# Patient Record
Sex: Male | Born: 1960 | Race: Black or African American | Hispanic: No | Marital: Single | Smoking: Current every day smoker
Health system: Southern US, Community
[De-identification: ages and names within clinical notes are randomized; demographics above are authoritative.]

## PROBLEM LIST (undated history)

## (undated) DIAGNOSIS — R51 Headache: Secondary | ICD-10-CM

## (undated) DIAGNOSIS — K219 Gastro-esophageal reflux disease without esophagitis: Secondary | ICD-10-CM

## (undated) DIAGNOSIS — R519 Headache, unspecified: Secondary | ICD-10-CM

## (undated) DIAGNOSIS — F259 Schizoaffective disorder, unspecified: Secondary | ICD-10-CM

## (undated) DIAGNOSIS — F250 Schizoaffective disorder, bipolar type: Secondary | ICD-10-CM

---

## 2000-12-17 ENCOUNTER — Emergency Department (HOSPITAL_COMMUNITY): Admission: EM | Admit: 2000-12-17 | Discharge: 2000-12-17 | Payer: Self-pay | Admitting: Emergency Medicine

## 2002-01-30 ENCOUNTER — Inpatient Hospital Stay (HOSPITAL_COMMUNITY): Admission: EM | Admit: 2002-01-30 | Discharge: 2002-02-08 | Payer: Self-pay | Admitting: Psychiatry

## 2002-01-30 ENCOUNTER — Encounter: Payer: Self-pay | Admitting: Emergency Medicine

## 2002-03-27 ENCOUNTER — Inpatient Hospital Stay (HOSPITAL_COMMUNITY): Admission: AD | Admit: 2002-03-27 | Discharge: 2002-03-31 | Payer: Self-pay | Admitting: Psychiatry

## 2004-10-04 ENCOUNTER — Ambulatory Visit: Payer: Self-pay | Admitting: Psychiatry

## 2004-10-04 ENCOUNTER — Inpatient Hospital Stay (HOSPITAL_COMMUNITY): Admission: EM | Admit: 2004-10-04 | Discharge: 2004-10-10 | Payer: Self-pay | Admitting: Psychiatry

## 2004-10-11 ENCOUNTER — Emergency Department (HOSPITAL_COMMUNITY): Admission: EM | Admit: 2004-10-11 | Discharge: 2004-10-11 | Payer: Self-pay | Admitting: Emergency Medicine

## 2005-04-02 ENCOUNTER — Other Ambulatory Visit: Payer: Self-pay

## 2005-04-02 ENCOUNTER — Emergency Department: Payer: Self-pay | Admitting: Internal Medicine

## 2005-04-03 ENCOUNTER — Ambulatory Visit: Payer: Self-pay | Admitting: Internal Medicine

## 2005-05-06 ENCOUNTER — Inpatient Hospital Stay: Payer: Self-pay | Admitting: Psychiatry

## 2005-09-01 ENCOUNTER — Inpatient Hospital Stay: Payer: Self-pay | Admitting: Unknown Physician Specialty

## 2005-12-11 ENCOUNTER — Emergency Department: Payer: Self-pay | Admitting: Internal Medicine

## 2006-03-08 ENCOUNTER — Inpatient Hospital Stay: Payer: Self-pay | Admitting: Unknown Physician Specialty

## 2006-03-15 ENCOUNTER — Inpatient Hospital Stay: Payer: Self-pay | Admitting: Psychiatry

## 2006-04-19 ENCOUNTER — Emergency Department: Payer: Self-pay | Admitting: Emergency Medicine

## 2006-04-19 ENCOUNTER — Other Ambulatory Visit: Payer: Self-pay

## 2006-07-26 ENCOUNTER — Emergency Department: Payer: Self-pay | Admitting: Emergency Medicine

## 2010-08-30 ENCOUNTER — Ambulatory Visit: Payer: Self-pay | Admitting: Internal Medicine

## 2010-10-01 DIAGNOSIS — Q401 Congenital hiatus hernia: Secondary | ICD-10-CM | POA: Insufficient documentation

## 2011-02-20 ENCOUNTER — Emergency Department: Payer: Self-pay | Admitting: *Deleted

## 2011-10-15 ENCOUNTER — Emergency Department: Payer: Self-pay | Admitting: Emergency Medicine

## 2013-06-20 ENCOUNTER — Emergency Department: Payer: Self-pay | Admitting: Emergency Medicine

## 2013-12-17 DIAGNOSIS — F19951 Other psychoactive substance use, unspecified with psychoactive substance-induced psychotic disorder with hallucinations: Secondary | ICD-10-CM | POA: Diagnosis present

## 2013-12-17 DIAGNOSIS — F1420 Cocaine dependence, uncomplicated: Secondary | ICD-10-CM | POA: Insufficient documentation

## 2013-12-17 DIAGNOSIS — E039 Hypothyroidism, unspecified: Secondary | ICD-10-CM | POA: Insufficient documentation

## 2013-12-17 DIAGNOSIS — H538 Other visual disturbances: Secondary | ICD-10-CM | POA: Insufficient documentation

## 2014-01-20 ENCOUNTER — Emergency Department: Payer: Self-pay | Admitting: Emergency Medicine

## 2014-01-20 LAB — URINALYSIS, COMPLETE
Bacteria: NONE SEEN
Bilirubin,UR: NEGATIVE
Glucose,UR: NEGATIVE mg/dL (ref 0–75)
Ketone: NEGATIVE
Leukocyte Esterase: NEGATIVE
Nitrite: NEGATIVE
Ph: 7 (ref 4.5–8.0)
Protein: NEGATIVE
RBC,UR: 4 /HPF (ref 0–5)
Specific Gravity: 1.009 (ref 1.003–1.030)
Squamous Epithelial: <1
WBC UR: 1 /HPF (ref 0–5)

## 2014-01-20 LAB — COMPREHENSIVE METABOLIC PANEL
Albumin: 2.8 g/dL — ABNORMAL LOW (ref 3.4–5.0)
Alkaline Phosphatase: 63 U/L
Anion Gap: 2 — ABNORMAL LOW (ref 7–16)
BUN: 11 mg/dL (ref 7–18)
Bilirubin,Total: 0.4 mg/dL (ref 0.2–1.0)
Calcium, Total: 9.1 mg/dL (ref 8.5–10.1)
Chloride: 107 mmol/L (ref 98–107)
Co2: 31 mmol/L (ref 21–32)
Creatinine: 1.07 mg/dL (ref 0.60–1.30)
EGFR (African American): >60
EGFR (Non-African Amer.): >60
Glucose: 99 mg/dL (ref 65–99)
Osmolality: 279 (ref 275–301)
Potassium: 3.9 mmol/L (ref 3.5–5.1)
SGOT(AST): 26 U/L (ref 15–37)
SGPT (ALT): 22 U/L
Sodium: 140 mmol/L (ref 136–145)
Total Protein: 7.6 g/dL (ref 6.4–8.2)

## 2014-01-20 LAB — CBC WITH DIFFERENTIAL/PLATELET
Basophil #: 0.1 10*3/uL (ref 0.0–0.1)
Basophil %: 0.8 %
Eosinophil #: 0.1 10*3/uL (ref 0.0–0.7)
Eosinophil %: 1.3 %
HCT: 47.8 % (ref 40.0–52.0)
HGB: 15.9 g/dL (ref 13.0–18.0)
Lymphocyte #: 2.2 10*3/uL (ref 1.0–3.6)
Lymphocyte %: 37.3 %
MCH: 32.5 pg (ref 26.0–34.0)
MCHC: 33.3 g/dL (ref 32.0–36.0)
MCV: 97 fL (ref 80–100)
Monocyte #: 0.8 x10 3/mm (ref 0.2–1.0)
Monocyte %: 13.9 %
Neutrophil #: 2.8 10*3/uL (ref 1.4–6.5)
Neutrophil %: 46.7 %
Platelet: 339 10*3/uL (ref 150–440)
RBC: 4.9 10*6/uL (ref 4.40–5.90)
RDW: 14.2 % (ref 11.5–14.5)
WBC: 6 10*3/uL (ref 3.8–10.6)

## 2014-01-20 LAB — LIPASE, BLOOD: Lipase: 128 U/L (ref 73–393)

## 2014-01-24 LAB — SALICYLATE LEVEL: Salicylates, Serum: 3.1 mg/dL — ABNORMAL HIGH

## 2014-01-24 LAB — URINALYSIS, COMPLETE
Bacteria: NONE SEEN
Bilirubin,UR: NEGATIVE
GLUCOSE, UR: NEGATIVE mg/dL (ref 0–75)
Ketone: NEGATIVE
Leukocyte Esterase: NEGATIVE
NITRITE: NEGATIVE
PH: 7 (ref 4.5–8.0)
Protein: NEGATIVE
RBC,UR: 1 /HPF (ref 0–5)
SPECIFIC GRAVITY: 1.01 (ref 1.003–1.030)
WBC UR: 1 /HPF (ref 0–5)

## 2014-01-24 LAB — COMPREHENSIVE METABOLIC PANEL
Albumin: 2.9 g/dL — ABNORMAL LOW (ref 3.4–5.0)
Alkaline Phosphatase: 73 U/L
Anion Gap: 9 (ref 7–16)
BUN: 12 mg/dL (ref 7–18)
Bilirubin,Total: 0.1 mg/dL — ABNORMAL LOW (ref 0.2–1.0)
Calcium, Total: 8.7 mg/dL (ref 8.5–10.1)
Chloride: 109 mmol/L — ABNORMAL HIGH (ref 98–107)
Co2: 23 mmol/L (ref 21–32)
Creatinine: 1.02 mg/dL (ref 0.60–1.30)
EGFR (African American): >60
EGFR (Non-African Amer.): >60
Glucose: 82 mg/dL (ref 65–99)
Osmolality: 280 (ref 275–301)
Potassium: 3.6 mmol/L (ref 3.5–5.1)
SGOT(AST): 33 U/L (ref 15–37)
SGPT (ALT): 27 U/L
Sodium: 141 mmol/L (ref 136–145)
Total Protein: 7.5 g/dL (ref 6.4–8.2)

## 2014-01-24 LAB — DRUG SCREEN, URINE
AMPHETAMINES, UR SCREEN: NEGATIVE (ref ?–1000)
Barbiturates, Ur Screen: NEGATIVE (ref ?–200)
Benzodiazepine, Ur Scrn: NEGATIVE (ref ?–200)
Cannabinoid 50 Ng, Ur ~~LOC~~: NEGATIVE (ref ?–50)
Cocaine Metabolite,Ur ~~LOC~~: POSITIVE (ref ?–300)
MDMA (Ecstasy)Ur Screen: NEGATIVE (ref ?–500)
Methadone, Ur Screen: NEGATIVE (ref ?–300)
Opiate, Ur Screen: NEGATIVE (ref ?–300)
Phencyclidine (PCP) Ur S: NEGATIVE (ref ?–25)
TRICYCLIC, UR SCREEN: NEGATIVE (ref ?–1000)

## 2014-01-24 LAB — CBC
HCT: 41.4 % (ref 40.0–52.0)
HGB: 14.2 g/dL (ref 13.0–18.0)
MCH: 33 pg (ref 26.0–34.0)
MCHC: 34.4 g/dL (ref 32.0–36.0)
MCV: 96 fL (ref 80–100)
Platelet: 382 10*3/uL (ref 150–440)
RBC: 4.31 10*6/uL — AB (ref 4.40–5.90)
RDW: 14 % (ref 11.5–14.5)
WBC: 5.9 10*3/uL (ref 3.8–10.6)

## 2014-01-24 LAB — ACETAMINOPHEN LEVEL

## 2014-01-24 LAB — ETHANOL
Ethanol %: 0.077 % (ref 0.000–0.080)
Ethanol: 77 mg/dL

## 2014-01-25 ENCOUNTER — Inpatient Hospital Stay: Payer: Self-pay | Admitting: Psychiatry

## 2014-01-27 LAB — HEMOGLOBIN A1C: Hemoglobin A1C: 5.7 % (ref 4.2–6.3)

## 2014-01-27 LAB — LIPID PANEL
Cholesterol: 156 mg/dL (ref 0–200)
HDL: 56 mg/dL (ref 40–60)
LDL CHOLESTEROL, CALC: 76 mg/dL (ref 0–100)
Triglycerides: 121 mg/dL (ref 0–200)
VLDL CHOLESTEROL, CALC: 24 mg/dL (ref 5–40)

## 2014-06-04 ENCOUNTER — Emergency Department: Payer: Self-pay | Admitting: Student

## 2014-06-16 ENCOUNTER — Emergency Department: Payer: Self-pay | Admitting: Emergency Medicine

## 2014-10-22 NOTE — Consult Note (Signed)
[name]:  Charles Charles, Charles Charles DATE OF BIRTH:  10/22/1960  DATE OF CONSULTATION:  01/24/2014  CONSULTING PHYSICIAN:  Jolanta B. Jennet MaduroPucilowska, MD  REQUESTING PHYSICIAN: Dr. Bayard Malesandolph Brown.  CONSULTING PHYSICIAN: Dr. Kristine LineaJolanta [name].  REASON FOR CONSULTATION: To evaluate psychotic patient.   IDENTIFYING DATA: Charles Charles is a 54 year old male with history of schizoaffective disorder and substance abuse.   CHIEF COMPLAINT: "I hear voices."   HISTORY OF PRESENT ILLNESS: Charles Charles has a long history of substance abuse, mood instability, and psychosis. He reports that 3 months ago he was in jail, where he received treatment with Paxil, Haldol, and Cogentin. Following release from jail, he has been off medications. He has been drinking alcohol and using cocaine steady for the past 3 months. He was staying with his mother but this is no longer possible and the patient is homeless. He came to the hospital complaining of auditory hallucinations, telling him to jump in front of the truck. He is open to substance abuse treatment. He reports some symptoms of depression with poor sleep, decreased appetite, anhedonia, feeling of guilt, hopelessness, worthlessness, but he did not have suicidal ideation until he developed hallucinations.   PAST PSYCHIATRIC HISTORY: At least 2 hospitalizations at [facility]. One admission to [facility] substance abuse treatment facility many years ago. He reports that [facility] but has not been there for a while. He reports 1 suicide attempt by cutting.   FAMILY PSYCHIATRIC HISTORY: None reported.   PAST MEDICAL HISTORY: GERD.   ALLERGIES: PENICILLIN.   MEDICATIONS ON ADMISSION: None. He should be taking Paxil 20 mg, Haldol 5 mg, Cogentin 1 mg twice daily, and Synthroid unknown dose.   SOCIAL HISTORY: As above, he is unemployed, has no income. He is homeless. No longer he is allowed to stay with his mother. He  has Medicaid.   REVIEW OF SYSTEMS:  CONSTITUTIONAL: No fevers or chills. No weight changes.  EYES: No double or blurred vision.  ENT: No hearing loss.  RESPIRATORY: No shortness of breath or cough.  CARDIOVASCULAR: No chest pain or orthopnea.  GASTROINTESTINAL: No abdominal pain, nausea, vomiting, or diarrhea.  GENITOURINARY: No incontinence or frequency.  ENDOCRINE: No heat or cold intolerance.  LYMPHATIC: No anemia or easy bruising.  INTEGUMENTARY: No acne or rash.  MUSCULOSKELETAL: No muscle or joint pain.  NEUROLOGIC: No tingling or weakness.  PSYCHIATRIC: See history of present illness for details.   PHYSICAL EXAMINATION:  VITAL SIGNS: Blood pressure 110/58, pulse 69, respirations 18, temperature 97.7.  GENERAL: This is a middle-aged male in no acute distress. The rest of the physical examination is deferred to his primary attending.   LABORATORY DATA: Chemistries are within normal limits. Blood alcohol level is 0.077. LFTs within normal limits. Urine tox screen positive for cocaine. CBC within normal limits. Urinalysis is not suggestive of urinary tract infection. Serum acetaminophen and salicylates are low.   MENTAL STATUS EXAMINATION: The patient is alert and oriented to person, place, time, and situation. He is pleasant, polite, and cooperative. He is well groomed, wearing hospital scrubs. He maintains good eye contact. His speech is of normal rhythm, rate, and volume. Mood is depressed with flat affect. Thought process is logical and goal oriented. He denies suicidal or homicidal ideations, but has auditory hallucinations commanding him to jump in front of the truck. His cognition is grossly intact. Registration, recall, short and long-term memory are intact. He is of average intelligence and  fund of knowledge. His insight and judgment are poor.    DIAGNOSES:  AXIS I: Schizoaffective disorder, depressed, cocaine dependence, alcohol dependence,   AXIS II: Deferred.   AXIS III:  Hypothyroidism.   AXIS IV: Mental illness, substance abuse, homelessness, primary support, poor insight, access to care.   AXIS V: Global assessment of functioning 35.   PLAN:  1. The patient is on IVC.  2. Will refer him to [facility] substance abuse treatment facility for further treatment. I will restart Paxil, Haldol, Cogentin.   We will follow up.    ____________________________ Braulio Conte B. Jennet Maduro, MD jbp:lt D: 01/24/2014 19:33:52 ET T: 01/25/2014 01:00:53 ET JOB#: [id number]  cc: Jolanta B. Jennet Maduro, MD, <Dictator> Shari Prows MD ELECTRONICALLY SIGNED 01/29/2014 7:23

## 2014-10-22 NOTE — Consult Note (Signed)
Brief Consult Note: Diagnosis: Alcohol dependence, cocaione dependence, shizoaffective disorder.   Patient was seen by consultant.   Consult note dictated.   Recommend further assessment or treatment.   Orders entered.   Comments: [name] has a h/o psychosis and substance use off medications for 3 months. He has command AH.  PLAN: 1. He is on IVC.  2. Will start Haldol and paxil for depression and psychosis.  3. Will refer to [facility].  Electronic Signatures: Kristine LineaPucilowska, Jalei Shibley (MD)  (Signed 27-Jul-15 14:50)  Authored: Brief Consult Note   Last Updated: 27-Jul-15 14:50 by Kristine LineaPucilowska, Kal Chait (MD)

## 2014-11-08 NOTE — H&P (Signed)
[name]:  Charles Charles, Charles Charles [id number] OF BIRTH:  August 14, 1960 OF ADMISSION:  01/25/2014  REFERRING PHYSICIAN: Dr. Bayard Malesandolph Charles.PHYSICIAN: Dr. Kristine LineaJolanta Tennis Charles. DATA: Charles Charles is a 54 year old male with history of schizoaffective disorder and substance abuse.  COMPLAINT: "I hear voices."  OF PRESENT ILLNESS: Charles Charles has a long history of substance abuse, mood instability, and psychosis. He reports that 3 months ago he was in jail for 4 months, where he received treatment with Paxil, Haldol, and Cogentin. Following release from jail, he has been off medications. He has been drinking alcohol and using cocaine steady for the past 3 months. He was staying with his mother but this is no longer possible and the patient is homeless. He came to the hospital complaining of auditory hallucinations, telling him to jump in front of the truck. He is open to substance abuse treatment. He reports some symptoms of depression with poor sleep, decreased appetite, anhedonia, feeling of guilt, hopelessness, worthlessness, but he did not have suicidal ideation until he developed hallucinations.  PSYCHIATRIC HISTORY: At least 2 hospitalizations at [facility]. One admission to [facility] substance abuse treatment facility many years ago. He reports that he gets his medications from [facility] but has not been there for a while. He reports 1 suicide attempt by cutting.  PSYCHIATRIC HISTORY: None reported.  MEDICAL HISTORY: GERD.   ALLERGIES: PENICILLIN.  ON ADMISSION: None. He should be taking Paxil 20 mg, Haldol 5 mg, Cogentin 1 mg twice daily, and Synthroid unknown dose.  HISTORY: As above, he is unemployed, has no income. He is homeless. No longer he is allowed to stay with his mother. He has Medicaid.  OF SYSTEMS: No fevers or chills. No weight changes. No double or blurred vision. No hearing loss. No shortness of breath or cough. No chest pain or orthopnea. No abdominal pain, nausea, vomiting, or  diarrhea. No incontinence or frequency. No heat or cold intolerance. No anemia or easy bruising. No acne or rash. No muscle or joint pain. No tingling or weakness. See history of present illness for details.  EXAMINATION: SIGNS: Blood pressure 10610/6658, pulse 4269, respirations 1820, temperature 97.67.  GENERAL: This is a middle-aged male in no acute distress. The pupils are equal, round, and reactive to light. Sclerae anicteric.  NECK: Supple. No thyromegaly. Clear to auscultation. No dullness to percussion. Regular rhythm and rate. No murmurs, rubs, or gallops. Soft, nontender, nondistended. Positive bowel sounds. Normal muscle strength in all extremities. No rashes or bruises. No cervical adenopathy. Cranial nerves II through XII are intact. DATA: Chemistries are within normal limits. Blood alcohol level is 0.077. LFTs within normal limits. Urine tox screen positive for cocaine. CBC within normal limits. Urinalysis is not suggestive of urinary tract infection. Serum acetaminophen and salicylates are low.  STATUS EXAMINATION ON ADMISSION: The patient is alert and oriented to person, place, time, and situation. He is pleasant, polite, and cooperative. He is well groomed, wearing hospital scrubs. He maintains good eye contact. His speech is of normal rhythm, rate, and volume. Mood is depressed with flat affect. Thought process is logical and goal oriented. He denies suicidal or homicidal ideations, but has auditory hallucinations commanding him to jump in front of the truck. His cognition is grossly intact. Registration, recall, short and long-term memory are intact. He is of average intelligence and fund of knowledge. His insight and judgment are poor.   DIAGNOSES: I:  Schizoaffective disorder, depressed.   Cocaine dependence.   Alcohol dependence, II:  Deferred.  III:  Hypothyroidism. IV:  Mental illness, substance abuse, homelessness, primary support, poor insight. V:  Global assessment of functioning on  admission 35.   Suicidal ideation: The patient is able to CFS. Mood/psychosis: We restart Paxil, Haldol, Cogentin.   3. Alcohol dependence: The patient has no symptoms of alcohol withdrawal. will continue to monitor.Substance abuse treatment: He was referred to [facility]. It is doubtful that he will have a bed soon. He will follow up with IOP program more likely.Dispo: He is now allowed to return to his mother's house.     Electronic Signatures: Charles Charles, Charles Charles (MD) (Signed on 29-Jul-15 16:38)  Authored   Last Updated: 29-Jul-15 16:40 by Charles Charles, Charles Charles (MD)

## 2014-12-14 DIAGNOSIS — F1510 Other stimulant abuse, uncomplicated: Secondary | ICD-10-CM | POA: Insufficient documentation

## 2014-12-23 ENCOUNTER — Emergency Department: Payer: Medicaid Other

## 2014-12-23 ENCOUNTER — Emergency Department
Admission: EM | Admit: 2014-12-23 | Discharge: 2014-12-24 | Disposition: A | Discharge status: Home / Self Care | Payer: Medicaid Other | Attending: Emergency Medicine | Admitting: Emergency Medicine

## 2014-12-23 ENCOUNTER — Encounter: Payer: Self-pay | Admitting: Emergency Medicine

## 2014-12-23 DIAGNOSIS — F259 Schizoaffective disorder, unspecified: Secondary | ICD-10-CM | POA: Diagnosis not present

## 2014-12-23 DIAGNOSIS — W010XXA Fall on same level from slipping, tripping and stumbling without subsequent striking against object, initial encounter: Secondary | ICD-10-CM | POA: Insufficient documentation

## 2014-12-23 DIAGNOSIS — Z720 Tobacco use: Secondary | ICD-10-CM | POA: Diagnosis not present

## 2014-12-23 DIAGNOSIS — Z880 Allergy status to penicillin: Secondary | ICD-10-CM | POA: Diagnosis not present

## 2014-12-23 DIAGNOSIS — Y9289 Other specified places as the place of occurrence of the external cause: Secondary | ICD-10-CM | POA: Insufficient documentation

## 2014-12-23 DIAGNOSIS — S80212A Abrasion, left knee, initial encounter: Secondary | ICD-10-CM | POA: Diagnosis not present

## 2014-12-23 DIAGNOSIS — S0990XA Unspecified injury of head, initial encounter: Secondary | ICD-10-CM | POA: Diagnosis present

## 2014-12-23 DIAGNOSIS — Y9389 Activity, other specified: Secondary | ICD-10-CM | POA: Diagnosis not present

## 2014-12-23 DIAGNOSIS — F10129 Alcohol abuse with intoxication, unspecified: Secondary | ICD-10-CM | POA: Insufficient documentation

## 2014-12-23 DIAGNOSIS — F1410 Cocaine abuse, uncomplicated: Secondary | ICD-10-CM

## 2014-12-23 DIAGNOSIS — Y998 Other external cause status: Secondary | ICD-10-CM | POA: Insufficient documentation

## 2014-12-23 DIAGNOSIS — S023XXA Fracture of orbital floor, initial encounter for closed fracture: Secondary | ICD-10-CM | POA: Insufficient documentation

## 2014-12-23 DIAGNOSIS — F10929 Alcohol use, unspecified with intoxication, unspecified: Secondary | ICD-10-CM

## 2014-12-23 DIAGNOSIS — F1210 Cannabis abuse, uncomplicated: Secondary | ICD-10-CM | POA: Insufficient documentation

## 2014-12-23 DIAGNOSIS — W19XXXA Unspecified fall, initial encounter: Secondary | ICD-10-CM

## 2014-12-23 DIAGNOSIS — S0093XA Contusion of unspecified part of head, initial encounter: Secondary | ICD-10-CM | POA: Diagnosis not present

## 2014-12-23 DIAGNOSIS — S0292XA Unspecified fracture of facial bones, initial encounter for closed fracture: Secondary | ICD-10-CM

## 2014-12-23 HISTORY — DX: Schizoaffective disorder, unspecified: F25.9

## 2014-12-23 HISTORY — DX: Schizoaffective disorder, bipolar type: F25.0

## 2014-12-23 LAB — CBC WITH DIFFERENTIAL/PLATELET
BASOS ABS: 0.1 10*3/uL (ref 0–0.1)
BASOS PCT: 0 %
EOS ABS: 0 10*3/uL (ref 0–0.7)
EOS PCT: 0 %
HCT: 39.3 % — ABNORMAL LOW (ref 40.0–52.0)
Hemoglobin: 12.7 g/dL — ABNORMAL LOW (ref 13.0–18.0)
Lymphocytes Relative: 23 %
Lymphs Abs: 3 10*3/uL (ref 1.0–3.6)
MCH: 30.8 pg (ref 26.0–34.0)
MCHC: 32.2 g/dL (ref 32.0–36.0)
MCV: 95.6 fL (ref 80.0–100.0)
Monocytes Absolute: 1.7 10*3/uL — ABNORMAL HIGH (ref 0.2–1.0)
Monocytes Relative: 13 %
Neutro Abs: 8.5 10*3/uL — ABNORMAL HIGH (ref 1.4–6.5)
Neutrophils Relative %: 64 %
PLATELETS: 316 10*3/uL (ref 150–440)
RBC: 4.11 MIL/uL — ABNORMAL LOW (ref 4.40–5.90)
RDW: 14.1 % (ref 11.5–14.5)
WBC: 13.2 10*3/uL — ABNORMAL HIGH (ref 3.8–10.6)

## 2014-12-23 LAB — URINE DRUG SCREEN, QUALITATIVE (ARMC ONLY)
AMPHETAMINES, UR SCREEN: NOT DETECTED
Barbiturates, Ur Screen: NOT DETECTED
Benzodiazepine, Ur Scrn: NOT DETECTED
Cannabinoid 50 Ng, Ur ~~LOC~~: POSITIVE — AB
Cocaine Metabolite,Ur ~~LOC~~: POSITIVE — AB
MDMA (ECSTASY) UR SCREEN: NOT DETECTED
Methadone Scn, Ur: NOT DETECTED
Opiate, Ur Screen: NOT DETECTED
Phencyclidine (PCP) Ur S: NOT DETECTED
TRICYCLIC, UR SCREEN: NOT DETECTED

## 2014-12-23 LAB — COMPREHENSIVE METABOLIC PANEL
ALBUMIN: 3.2 g/dL — AB (ref 3.5–5.0)
ALT: 22 U/L (ref 17–63)
ANION GAP: 18 — AB (ref 5–15)
AST: 27 U/L (ref 15–41)
Alkaline Phosphatase: 53 U/L (ref 38–126)
BILIRUBIN TOTAL: 0.3 mg/dL (ref 0.3–1.2)
BUN: 13 mg/dL (ref 6–20)
CALCIUM: 8.9 mg/dL (ref 8.9–10.3)
CHLORIDE: 109 mmol/L (ref 101–111)
CO2: 13 mmol/L — ABNORMAL LOW (ref 22–32)
CREATININE: 1.08 mg/dL (ref 0.61–1.24)
GFR calc Af Amer: >60 mL/min (ref >60)
Glucose, Bld: 103 mg/dL — ABNORMAL HIGH (ref 65–99)
Potassium: 3.3 mmol/L — ABNORMAL LOW (ref 3.5–5.1)
Sodium: 140 mmol/L (ref 135–145)
Total Protein: 7 g/dL (ref 6.5–8.1)

## 2014-12-23 LAB — URINALYSIS COMPLETE WITH MICROSCOPIC (ARMC ONLY)
Bacteria, UA: NONE SEEN
Bilirubin Urine: NEGATIVE
Glucose, UA: NEGATIVE mg/dL
KETONES UR: NEGATIVE mg/dL
LEUKOCYTES UA: NEGATIVE
NITRITE: NEGATIVE
Protein, ur: NEGATIVE mg/dL
Specific Gravity, Urine: 1.005 (ref 1.005–1.030)
Squamous Epithelial / LPF: NONE SEEN
pH: 5 (ref 5.0–8.0)

## 2014-12-23 LAB — TROPONIN I

## 2014-12-23 LAB — ETHANOL: Alcohol, Ethyl (B): 133 mg/dL — ABNORMAL HIGH (ref <5)

## 2014-12-23 MED ORDER — DEXTROSE 5 % IV SOLN
500.0000 mg | Freq: Once | INTRAVENOUS | Status: AC
  Administered 2014-12-23: 500 mg via INTRAVENOUS

## 2014-12-23 MED ORDER — AZITHROMYCIN 500 MG PO TABS: 500.0000 mg | ORAL_TABLET | Freq: Every day | ORAL | Status: AC

## 2014-12-23 MED ORDER — DEXTROSE 5 % IV SOLN
INTRAVENOUS | Status: AC
Start: 2014-12-23 — End: 2014-12-24
  Administered 2014-12-23: 500 mg via INTRAVENOUS
  Filled 2014-12-23: qty 500

## 2014-12-23 MED ORDER — AZITHROMYCIN 250 MG PO TABS
ORAL_TABLET | ORAL | Status: AC
  Filled 2014-12-23: qty 2

## 2014-12-23 MED ORDER — SODIUM CHLORIDE 0.9 % IV BOLUS (SEPSIS)
1000.0000 mL | Freq: Once | INTRAVENOUS | Status: AC
  Administered 2014-12-23: 1000 mL via INTRAVENOUS

## 2014-12-23 NOTE — Discharge Instructions (Signed)
You have a fracture of the bone on the inside wall of your left eye. With this fracture, you have some blood in your sinus. You may experience ongoing pressure and some congestion. Take azithromycin due to the congestion in your sinus. Follow-up with either ear nose and throat or with ophthalmology or further evaluation of this facial fracture.  Your head CT was okay-no bleeding on your brain. The CT of your neck was okay also.  Return to the emergency department if you have vision problems such as double vision, a disproportionate headache, or if you have other urgent concerns.  Polysubstance Abuse When people abuse more than one drug or type of drug it is called polysubstance or polydrug abuse. For example, many smokers also drink alcohol. This is one form of polydrug abuse. Polydrug abuse also refers to the use of a drug to counteract an unpleasant effect produced by another drug. It may also be used to help with withdrawal from another drug. People who take stimulants may become agitated. Sometimes this agitation is countered with a tranquilizer. This helps protect against the unpleasant side effects. Polydrug abuse also refers to the use of different drugs at the same time.  Anytime drug use is interfering with normal living activities, it has become abuse. This includes problems with family and friends. Psychological dependence has developed when your mind tells you that the drug is needed. This is usually followed by physical dependence which has developed when continuing increases of drug are required to get the same feeling or "high". This is known as addiction or chemical dependency. A person's risk is much higher if there is a history of chemical dependency in the family. SIGNS OF CHEMICAL DEPENDENCY  You have been told by friends or family that drugs have become a problem.  You fight when using drugs.  You are having blackouts (not remembering what you do while using).  You feel sick  from using drugs but continue using.  You lie about use or amounts of drugs (chemicals) used.  You need chemicals to get you going.  You are suffering in work performance or in school because of drug use.  You get sick from use of drugs but continue to use anyway.  You need drugs to relate to people or feel comfortable in social situations.  You use drugs to forget problems. "Yes" answered to any of the above signs of chemical dependency indicates there are problems. The longer the use of drugs continues, the greater the problems will become. If there is a family history of drug or alcohol use, i Head Injury You have a head injury. Headaches and throwing up (vomiting) are common after a head injury. It should be easy to wake up from sleeping. Sometimes you must stay in the hospital. Most problems happen within the first 24 hours. Side effects may occur up to 7-10 days after the injury.  WHAT ARE THE TYPES OF HEAD INJURIES? Head injuries can be as minor as a bump. Some head injuries can be more severe. More severe head injuries include:  A jarring injury to the brain (concussion).  A bruise of the brain (contusion). This mean there is bleeding in the brain that can cause swelling.  A cracked skull (skull fracture).  Bleeding in the brain that collects, clots, and forms a bump (hematoma). WHEN SHOULD I GET HELP RIGHT AWAY?   You are confused or sleepy.  You cannot be woken up.  You feel sick to your stomach (nauseous)  or keep throwing up (vomiting).  Your dizziness or unsteadiness is getting worse.  You have very bad, lasting headaches that are not helped by medicine. Take medicines only as told by your doctor.  You cannot use your arms or legs like normal.  You cannot walk.  You notice changes in the black spots in the center of the colored part of your eye (pupil).  You have clear or bloody fluid coming from your nose or ears.  You have trouble seeing. During the next  24 hours after the injury, you must stay with someone who can watch you. This person should get help right away (call 911 in the [place] if you start to shake and are not able to control it (have seizures), you pass out, or you are unable to wake up. HOW CAN I PREVENT A HEAD INJURY IN THE FUTURE?  Wear seat belts.  Wear a helmet while bike riding and playing sports like football.  Stay away from dangerous activities around the house. WHEN CAN I RETURN TO NORMAL ACTIVITIES AND ATHLETICS? See your doctor before doing these activities. You should not do normal activities or play contact sports until 1 week after the following symptoms have stopped:  Headache that does not go away.  Dizziness.  Poor attention.  Confusion.  Memory problems.  Sickness to your stomach or throwing up.  Tiredness.  Fussiness.  Bothered by bright lights or loud noises.  Anxiousness or depression.  Restless sleep. MAKE SURE YOU:   Understand these instructions.  Will watch your condition.  Will get help right away if you are not doing well or get worse. Document Released: 05/30/2008 Document Revised: 11/01/2013 Document Reviewed: 02/22/2013 [facility] Patient Information 2015 [place], [place]. This information is not intended to replace advice given to you by your health care provider. Make sure you discuss any questions you have with your health care provider. t is best not to experiment with these drugs. Continual use leads to tolerance. After tolerance develops more of the drug is needed to get the same feeling. This is followed by addiction. With addiction, drugs become the most important part of life. It becomes more important to take drugs than participate in the other usual activities of life. This includes relating to friends and family. Addiction is followed by dependency. Dependency is a condition where drugs are now needed not just to get high, but to feel normal. Addiction cannot be cured but it  can be stopped. This often requires outside help and the care of professionals. Treatment centers are listed in the yellow pages under: Cocaine, Narcotics, and Alcoholics Anonymous. Most hospitals and clinics can refer you to a specialized care center. Talk to your caregiver if you need help. Document Released: 02/06/2005 Document Revised: 09/09/2011 Document Reviewed: 06/17/2005 [facility] Patient Information 2015 [place], [place]. This information is not intended to replace advice given to you by your health care provider. Make sure you discuss any questions you have with your health care provider.

## 2014-12-23 NOTE — ED Provider Notes (Signed)
[facility] Emergency Department Provider Note  ____________________________________________  Time seen: 2050  I have reviewed the triage vital signs and the nursing notes.  History by patient. This may be somewhat limited by intoxication. Additional history by paramedic.  HISTORY  Chief Complaint Fall facial contusions Alcohol intoxication   HPI Charles Charles is a 54 y.o. male who is been drinking today. He also smoked a little bit of crack cocaine. After smoking crack he was walking home and the patient reports he tripped. He reports he had a loss of consciousness and the loss of consciousness is why he tripped. The paramedic reports that the patient had said earlier that he had tripped on a curb and this was consistent with the paramedics observations at the scene as a possible mechanism of injury.  With the fall, the patient hit his face. He has notable contusion to the left brow which included some bleeding. He reports he has pain in his face and in his neck. He also has an abrasion to the left knee.  The patient was recently released from [facility] for schizophrenia. He has been prescribed Haldol, but he has not had this prescription filled yet.   Past Medical History  Diagnosis Date  . Schizo-affective schizophrenia    Schizophrenia  There are no active problems to display for this patient.   History reviewed. No pertinent past surgical history.  Current Outpatient Rx  Name  Route  Sig  Dispense  Refill  . azithromycin (ZITHROMAX) 500 MG tablet   Oral   Take 1 tablet (500 mg total) by mouth daily. Take 1 tablet daily for 3 days.   3 tablet   0     Allergies Penicillins  History reviewed. No pertinent family history.  Social History History  Substance Use Topics  . Smoking status: Current Every Day Smoker -- 0.10 packs/day    Types: Cigarettes  . Smokeless tobacco: Not on file  . Alcohol Use: 33.6 oz/week    56 Cans of beer per  week     Comment: 2 x 40's dailey    Review of Systems  Constitutional: Negative for fever. ENT: Notable for contusions and abrasions to the face. See history of present illness Cardiovascular: Loss of consciousness, likely due to head injury, but syncope not excluded. See history of present illness Respiratory: Negative for shortness of breath. Gastrointestinal: Negative for abdominal pain, vomiting and diarrhea. Genitourinary: Negative for dysuria. Musculoskeletal: No myalgias or injuries. Skin: Abrasions as noted. Neurological: Some slurred speech consistent with intoxication. Psychiatric: Recent hospitalization for schizophrenia.   10-point ROS otherwise negative.  ____________________________________________   PHYSICAL EXAM:  VITAL SIGNS: ED Triage Vitals  Enc Vitals Group     BP 12/23/14 2054 127/82 mmHg     Pulse Rate 12/23/14 2054 86     Resp 12/23/14 2054 18     Temp 12/23/14 2054 99.1 F (37.3 C)     Temp Source 12/23/14 2054 Oral     SpO2 12/23/14 2047 98 %     Weight 12/23/14 2054 163 lb 8 oz (74.163 kg)     Height 12/23/14 2054  (1.575 m)     Head Cir --      Peak Flow --      Pain Score 12/23/14 2057 8     Pain Loc --      Pain Edu? --      Excl. in GC? --     Constitutional: Patient is alert  but with slurred speech and appears intoxicated. He follows commands and provides reasonable answers. He is on a backboard with a c-collar in place. ENT   Head: Contusion and swelling above the left brow..   Face:  There is point tenderness to the maxilla. No noted deformity. Assessment for instability is not tolerated.      Nose: No bleeding or discharge. No deformity. Neck:  Notably tender diffusely. Cervical spine precautions continued Cardiovascular: Normal rate, regular rhythm, no murmur noted Respiratory:  Normal respiratory effort, no tachypnea.    Breath sounds are clear and equal bilaterally.  Gastrointestinal: Soft and nontender. No  distention.  Back: No muscle spasm, no tenderness, no CVA tenderness. Musculoskeletal: No deformity noted. Some tenderness around the left knee where there is an abrasion. He has some discomfort with range of motion of the left knee. No deformity.  Neurologic:  Mildly slurred speech and little bit slow to respond to certain questions. No gross focal neurologic deficits are appreciated.  Skin:  Skin is warm, dry. Abrasion above left brow as well as left knee. Psychiatric: Verbal, communicative, but appears intoxicated. ____________________________________________    LABS (pertinent positives/negatives)  Urinalysis: Negative Urine drug screen positive for cocaine and cannabinoids. Alcohol: 133 CBC: White blood cell 13,000, hemoglobin 12.7, platelets 316 Metabolic panel: Pending Troponin: Pending ____________________________________________   EKG  ED ECG REPORT I, Abdulrahim Siddiqi W, the attending physician, personally viewed and interpreted this ECG.   Date: 12/23/2014  EKG Time: 2101  Rate: 97  Rhythm: Normal sinus rhythm  Axis: Normal  Intervals: Normal   ST&T Change: None noted   ____________________________________________    RADIOLOGY  CT of head, maxillofacial, and cervical spine: IMPRESSION: 1. No evidence of traumatic intracranial injury. 2. Depressed fracture involving the medial wall of the left orbit, with mild herniation of intraorbital fat, and opacification of the left ethmoid air cells and left nasal passages. Trace associated blood and air noted medial to the left medial rectus muscle. No evidence of entrapment of the extraocular musculature at this time. 3. Soft tissue swelling about the left orbit, and overlying the left maxilla. 4. No evidence of fracture or subluxation along the cervical spine. 5. Minimal bronchiectasis at the right lung apex.    ____________________________________________  INITIAL IMPRESSION / ASSESSMENT AND PLAN / ED  COURSE  Pertinent labs & imaging results that were available during my care of the patient were reviewed by me and considered in my medical decision making (see chart for details).  Patient with a fall. Unclear if this was syncope or mechanical. Suspect primarily mechanical. He has notable contusion to the left brow. He has pain in his neck. We'll obtain a CT scan of the head, maxillofacial, and cervical spine. We will check labs and assess a urine drug screen and an alcohol level.  ----------------------------------------- 10:52 PM on 12/23/2014 -----------------------------------------  We received a call from radiology who reports a negative head CT, a positive axilla facial CT with a medial wall fracture of the left orbit, and a negative C-spine CT.  I will treat the patient with an antibiotic due to opacification of the left sinus secondary to the medial wall fracture. He is allergic penicillins. We will treat him with azithromycin. This is he had a bandage that the duration of treatment can be short and yet still provide him with adequate long-term coverage.  ----------------------------------------- 11:30 PM on 12/23/2014 -----------------------------------------  I have reassessed the patient. He is more communicative. I have reviewed the orbital fracture with  him and the need for follow-up. He appears to understand the situation currently but he reports that he has limited ability to remember details. We are attempting to contact a friend or family for the patient. We expect revealed to discharge him after IV antibiotics.  I have reassessed the left eyelid. He continues to have a small amount of blood from the small skin injury. There is no indication for closure.  I have asked my colleague, Dr. Dolores Frame, to follow-up on the Presence of metabolic panel and the troponin. If these are normal, the patient should be able to be discharged home for outpatient  follow-up.  ____________________________________________   FINAL CLINICAL IMPRESSION(S) / ED DIAGNOSES  Final diagnoses:  Facial fracture due to fall, closed, initial encounter  Head contusion, initial encounter  Alcohol intoxication, with unspecified complication  Cocaine abuse      Darien Ramus, MD 12/23/14 2332

## 2014-12-23 NOTE — ED Notes (Signed)
Patient transported to CT 

## 2014-12-23 NOTE — ED Notes (Signed)
Patient presents to Emergency Department via EMS with complaints of fall, unwitnessed, pt believes LOC.  Injury to left eye, cheek arch, left elbow, left knee.  Pt reports ETOH  and smoking crack earlier today. Hx schizo diag from leaving [place] this week with script for haldol.

## 2014-12-24 MED ORDER — SODIUM CHLORIDE 0.9 % IV BOLUS (SEPSIS)
1000.0000 mL | Freq: Once | INTRAVENOUS | Status: AC
  Administered 2014-12-24: 1000 mL via INTRAVENOUS

## 2014-12-24 MED ORDER — OXYCODONE-ACETAMINOPHEN 5-325 MG PO TABS: 1.0000 | ORAL_TABLET | Freq: Four times a day (QID) | ORAL | Status: DC | PRN

## 2014-12-24 NOTE — ED Notes (Signed)
Dry dressing placed over left eye.

## 2014-12-24 NOTE — ED Provider Notes (Signed)
-----------------------------------------   8:57 AM on 12/24/2014 -----------------------------------------  Patient able to ambulate, but does note that it is somewhat difficult due to pain and soreness. He is steady on his feet, clear speech, tolerating by mouth. He is clinically sober and stable for discharge at this time. He has been counseled extensively on his facial fractures and started on prophylactic antibiotics and will follow up with ENT or ophthalmology.  Sharman Cheek, MD 12/24/14 [phone]

## 2014-12-24 NOTE — ED Provider Notes (Signed)
-----------------------------------------   1:57 AM on 12/24/2014 -----------------------------------------  Results of metabolic panel and troponin noted. Troponin is negative. Patient has a mildly elevated anion gap which is likely due to substance use. Negative ketones in urine with normal glucose. Will continue IV fluid resuscitation. Patient is currently sleeping soundly in no acute distress.  ----------------------------------------- 6:44 AM on 12/24/2014 -----------------------------------------  Patient got up several times during the night; is currently resting in no acute distress. Family has called to inquire about him. Plan to ambulate patient once awake. Anticipate discharge home once patient is sober and ambulatory. Care transferred to Dr. Scotty Court.  Irean Hong, MD 12/24/14 [phone]

## 2015-07-13 ENCOUNTER — Emergency Department
Admission: EM | Admit: 2015-07-13 | Discharge: 2015-07-13 | Disposition: A | Discharge status: Home / Self Care | Payer: Medicaid Other | Attending: Emergency Medicine | Admitting: Emergency Medicine

## 2015-07-13 ENCOUNTER — Emergency Department: Payer: Medicaid Other

## 2015-07-13 DIAGNOSIS — F17210 Nicotine dependence, cigarettes, uncomplicated: Secondary | ICD-10-CM | POA: Insufficient documentation

## 2015-07-13 DIAGNOSIS — Z880 Allergy status to penicillin: Secondary | ICD-10-CM | POA: Diagnosis not present

## 2015-07-13 DIAGNOSIS — R109 Unspecified abdominal pain: Secondary | ICD-10-CM | POA: Diagnosis present

## 2015-07-13 DIAGNOSIS — K5900 Constipation, unspecified: Secondary | ICD-10-CM | POA: Insufficient documentation

## 2015-07-13 DIAGNOSIS — R1084 Generalized abdominal pain: Secondary | ICD-10-CM

## 2015-07-13 MED ORDER — POLYETHYLENE GLYCOL 3350 17 G PO PACK: 17.0000 g | PACK | Freq: Every day | ORAL | Status: DC | PRN

## 2015-07-13 MED ORDER — MAGNESIUM CITRATE PO SOLN
160.0000 mL | Freq: Once | ORAL | Status: AC
  Administered 2015-07-13: 160 mL via ORAL
  Filled 2015-07-13: qty 296

## 2015-07-13 MED ORDER — DOCUSATE SODIUM 100 MG PO CAPS
100.0000 mg | ORAL_CAPSULE | Freq: Once | ORAL | Status: AC
  Administered 2015-07-13: 100 mg via ORAL
  Filled 2015-07-13: qty 1

## 2015-07-13 MED ORDER — ACETAMINOPHEN 325 MG PO TABS
650.0000 mg | ORAL_TABLET | Freq: Once | ORAL | Status: AC
  Administered 2015-07-13: 650 mg via ORAL

## 2015-07-13 MED ORDER — LORAZEPAM 1 MG PO TABS
1.0000 mg | ORAL_TABLET | Freq: Once | ORAL | Status: AC
  Administered 2015-07-13: 1 mg via ORAL
  Filled 2015-07-13: qty 1

## 2015-07-13 MED ORDER — ACETAMINOPHEN 325 MG PO TABS
ORAL_TABLET | ORAL | Status: AC
  Administered 2015-07-13: 650 mg via ORAL
  Filled 2015-07-13: qty 2

## 2015-07-13 MED ORDER — LACTULOSE 10 GM/15ML PO SOLN
10.0000 g | Freq: Once | ORAL | Status: AC
  Administered 2015-07-13: 10 g via ORAL
  Filled 2015-07-13: qty 30

## 2015-07-13 NOTE — ED Provider Notes (Signed)
-----------------------------------------   9:18 PM on 07/13/2015 -----------------------------------------   Initially the plan was for Dr. Pershing ProudSchaevitz sinus patient out to me but he was able to disposition the patient prior to his departure from the department.  I had no participation in this patient's care.   Loleta Roseory Monaca Wadas, MD 07/14/15 [phone]

## 2015-07-13 NOTE — ED Notes (Signed)
Pt reports constipation x 1 mo, only able to have small BMs last one yesterday, pt taking OTC laxatives w/o relief.  Pt NAD at this time, respirations equal and unlabored, skin warm and dry.

## 2015-07-13 NOTE — ED Notes (Signed)
Enema given by Sherilyn CooterHenry, RN.  Pt reports since then has had large BM.  Pt given meal per pt request

## 2015-07-13 NOTE — Discharge Instructions (Signed)
Constipation, Adult Constipation is when a person:  Poops (has a bowel movement) less than 3 times a week.  Has a hard time pooping.  Has poop that is dry, hard, or bigger than normal. HOME CARE   Eat foods with a lot of fiber in them. This includes fruits, vegetables, beans, and whole grains such as brown rice.  Avoid fatty foods and foods with a lot of sugar. This includes french fries, hamburgers, cookies, candy, and soda.  If you are not getting enough fiber from food, take products with added fiber in them (supplements).  Drink enough fluid to keep your pee (urine) clear or pale yellow.  Exercise on a regular basis, or as told by your doctor.  Go to the restroom when you feel like you need to poop. Do not hold it.  Only take medicine as told by your doctor. Do not take medicines that help you poop (laxatives) without talking to your doctor first. GET HELP RIGHT AWAY IF:   You have bright red blood in your poop (stool).  Your constipation lasts more than 4 days or gets worse.  You have belly (abdominal) or butt (rectal) pain.  You have thin poop (as thin as a pencil).  You lose weight, and it cannot be explained. MAKE SURE YOU:   Understand these instructions.  Will watch your condition.  Will get help right away if you are not doing well or get worse.   This information is not intended to replace advice given to you by your health care provider. Make sure you discuss any questions you have with your health care provider.   Document Released: 12/04/2007 Document Revised: 07/08/2014 Document Reviewed: 03/29/2013 Elsevier Interactive Patient Education 2016 [employer] Abdominal Pain, Adult Many things can cause abdominal pain. Usually, abdominal pain is not caused by a disease and will improve without treatment. It can often be observed and treated at home. Your health care provider will do a physical exam and possibly order blood tests and X-rays to help determine  the seriousness of your pain. However, in many cases, more time must pass before a clear cause of the pain can be found. Before that point, your health care provider may not know if you need more testing or further treatment. HOME CARE INSTRUCTIONS Monitor your abdominal pain for any changes. The following actions may help to alleviate any discomfort you are experiencing:  Only take over-the-counter or prescription medicines as directed by your health care provider.  Do not take laxatives unless directed to do so by your health care provider.  Try a clear liquid diet (broth, tea, or water) as directed by your health care provider. Slowly move to a bland diet as tolerated. SEEK MEDICAL CARE IF:  You have unexplained abdominal pain.  You have abdominal pain associated with nausea or diarrhea.  You have pain when you urinate or have a bowel movement.  You experience abdominal pain that wakes you in the night.  You have abdominal pain that is worsened or improved by eating food.  You have abdominal pain that is worsened with eating fatty foods.  You have a fever. SEEK IMMEDIATE MEDICAL CARE IF:  Your pain does not go away within 2 hours.  You keep throwing up (vomiting).  Your pain is felt only in portions of the abdomen, such as the right side or the left lower portion of the abdomen.  You pass bloody or black tarry stools. MAKE SURE YOU:  Understand these instructions.    Will watch your condition.  Will get help right away if you are not doing well or get worse.   This information is not intended to replace advice given to you by your health care provider. Make sure you discuss any questions you have with your health care provider.   Document Released: 03/27/2005 Document Revised: 03/08/2015 Document Reviewed: 02/24/2013 Elsevier Interactive Patient Education 2016 Elsevier Inc.  

## 2015-07-13 NOTE — ED Provider Notes (Addendum)
[facility] Emergency Department Provider Note  ____________________________________________  Time seen: Approximately 850 PM  I have reviewed the triage vital signs and the nursing notes.   HISTORY  Chief Complaint Constipation   HPI Milinda CaveGarry L Benda is a 55 y.o. male with a history of schizophrenia who is presenting today with constipation which is worsening over the past month. He says that he has been taking Ex-Lax over the past several days and only had one small bowel movement yesterday. He is also not passed any gas since he had his bowel movement one day ago. He denies any nausea or vomiting. Says that he does not eat a lot of fruits and vegetables or fiber. Denies any recent changes in his medications. Denies taking any pain meds at this time. Says that he has cramping diffusely in his abdomen worsening over the past day.   Past Medical History  Diagnosis Date  . Schizo-affective schizophrenia (HCC)     There are no active problems to display for this patient.   History reviewed. No pertinent past surgical history.  Current Outpatient Rx  Name  Route  Sig  Dispense  Refill  . oxyCODONE-acetaminophen (ROXICET) 5-325 MG per tablet   Oral   Take 1 tablet by mouth every 6 (six) hours as needed for severe pain.   8 tablet   0     Allergies Penicillins  No family history on file.  Social History Social History  Substance Use Topics  . Smoking status: Current Every Day Smoker -- 0.10 packs/day    Types: Cigarettes  . Smokeless tobacco: None  . Alcohol Use: 33.6 oz/week    56 Cans of beer per week     Comment: 2 x 40's dailey    Review of Systems Constitutional: No fever/chills Eyes: No visual changes. ENT: No sore throat. Cardiovascular: Denies chest pain. Respiratory: Denies shortness of breath. Gastrointestinal:   No nausea, no vomiting.  No diarrhea.   Genitourinary: Negative for dysuria. Musculoskeletal: Negative for back  pain. Skin: Negative for rash. Neurological: Negative for headaches, focal weakness or numbness.  10-point ROS otherwise negative.  ____________________________________________   PHYSICAL EXAM:  VITAL SIGNS: ED Triage Vitals  Enc Vitals Group     BP 07/13/15 1935 102/67 mmHg     Pulse Rate 07/13/15 1935 111     Resp 07/13/15 1935 22     Temp 07/13/15 1935 98.9 F (37.2 C)     Temp Source 07/13/15 1935 Oral     SpO2 07/13/15 1935 98 %     Weight 07/13/15 1935 170 lb (77.111 kg)     Height 07/13/15 1935 5\' 2"  (1.575 m)     Head Cir --      Peak Flow --      Pain Score 07/13/15 1937 9     Pain Loc --      Pain Edu? --      Excl. in GC? --     Constitutional: Alert and oriented. Well appearing and in no acute distress. Eyes: Conjunctivae are normal. PERRL. EOMI. Head: Atraumatic. Nose: No congestion/rhinnorhea. Mouth/Throat: Mucous membranes are moist.   Neck: No stridor.   Cardiovascular: Normal rate, regular rhythm. Grossly normal heart sounds.  Good peripheral circulation. Respiratory: Normal respiratory effort.  No retractions. Lungs CTAB. Gastrointestinal: Soft with mild tenderness diffusely with the worst being in the right lower quadrant. No distention.  No CVA tenderness. Rectal exam without fecal impaction. No stool in the rectal vault. Grossly  brown stool which is heme negative. Musculoskeletal: No lower extremity tenderness nor edema.  No joint effusions. Neurologic:  Normal speech and language. No gross focal neurologic deficits are appreciated. No gait instability. Skin:  Skin is warm, dry and intact. No rash noted. Psychiatric: Mood and affect are normal. Speech and behavior are normal.  ____________________________________________   LABS (all labs ordered are listed, but only abnormal results are displayed)  Labs Reviewed - No data to  display ____________________________________________  EKG   ____________________________________________  RADIOLOGY  Large colonic stool burning which is worst in the right colon. ____________________________________________   PROCEDURES   ____________________________________________   INITIAL IMPRESSION / ASSESSMENT AND PLAN / ED COURSE  Pertinent labs & imaging results that were available during my care of the patient were reviewed by me and considered in my medical decision making (see chart for details).  ----------------------------------------- 9:36 PM on 07/13/2015 -----------------------------------------  Patient will be receiving an enema for symptomatically relief. He knows to increase fiber in his diet such as fiber cereal or fiber bars and more fruits and vegetables. Also discharged with MiraLAX. Signed out to Dr. York Cerise for reassessment. ____________________________________________   FINAL CLINICAL IMPRESSION(S) / ED DIAGNOSES  Final diagnoses:  Constipation   abdominal pain.    Myrna Blazer, MD 07/13/15 2137  Patient only able to tolerate a small amount of the enema before asking the nurse to stop, but still able to have a large bowel movent.  Belly pain now relieved.  Belly reexamined and is soft and non tender throughout.  Pt understands plan for dietary changes as listed above as well as increasing water intake.  Now eating/tolerating po. To discharge to home.    Myrna Blazer, MD 07/13/15 2152

## 2015-07-13 NOTE — ED Notes (Signed)
States he has been constipated x 5 days. LNBM was last month. States his abdomen is hurting and can't get comfortable on his back so has to lay on his side. Denies nausea or vomiting. States he is schizophrenic and takes his psych medications as directed; denies narcotic pain medications.

## 2015-07-13 NOTE — ED Notes (Signed)
Pt states unable to tolerate enema at this time, will try again in 20 min

## 2015-07-13 NOTE — ED Notes (Signed)
Charles CooterHenry RN attempted to give the Pt an enema but the Pt stated that it was too painful. Pt was able to retain 1/3 of the enema bag.

## 2015-07-14 ENCOUNTER — Encounter
Admission: EM | Disposition: A | Discharge status: Home / Self Care | Payer: Self-pay | Source: Home / Self Care | Attending: Emergency Medicine

## 2015-07-14 ENCOUNTER — Observation Stay
Admission: EM | Admit: 2015-07-14 | Discharge: 2015-07-15 | Disposition: A | Discharge status: Home / Self Care | Payer: Medicaid Other | Attending: Surgery | Admitting: Surgery

## 2015-07-14 ENCOUNTER — Emergency Department: Payer: Medicaid Other | Admitting: Anesthesiology

## 2015-07-14 ENCOUNTER — Emergency Department: Payer: Medicaid Other

## 2015-07-14 DIAGNOSIS — J9811 Atelectasis: Secondary | ICD-10-CM | POA: Insufficient documentation

## 2015-07-14 DIAGNOSIS — K5641 Fecal impaction: Secondary | ICD-10-CM | POA: Diagnosis not present

## 2015-07-14 DIAGNOSIS — R140 Abdominal distension (gaseous): Secondary | ICD-10-CM | POA: Insufficient documentation

## 2015-07-14 DIAGNOSIS — F17210 Nicotine dependence, cigarettes, uncomplicated: Secondary | ICD-10-CM | POA: Insufficient documentation

## 2015-07-14 DIAGNOSIS — Q438 Other specified congenital malformations of intestine: Secondary | ICD-10-CM | POA: Insufficient documentation

## 2015-07-14 DIAGNOSIS — R109 Unspecified abdominal pain: Secondary | ICD-10-CM | POA: Diagnosis not present

## 2015-07-14 DIAGNOSIS — Z880 Allergy status to penicillin: Secondary | ICD-10-CM | POA: Insufficient documentation

## 2015-07-14 DIAGNOSIS — K610 Anal abscess: Secondary | ICD-10-CM | POA: Diagnosis not present

## 2015-07-14 DIAGNOSIS — K611 Rectal abscess: Principal | ICD-10-CM

## 2015-07-14 DIAGNOSIS — F209 Schizophrenia, unspecified: Secondary | ICD-10-CM | POA: Insufficient documentation

## 2015-07-14 HISTORY — PX: INCISION AND DRAINAGE PERIRECTAL ABSCESS: SHX1804

## 2015-07-14 LAB — COMPREHENSIVE METABOLIC PANEL
ALBUMIN: 3.3 g/dL — AB (ref 3.5–5.0)
ALT: 64 U/L — ABNORMAL HIGH (ref 17–63)
ANION GAP: 9 (ref 5–15)
AST: 70 U/L — ABNORMAL HIGH (ref 15–41)
Alkaline Phosphatase: 79 U/L (ref 38–126)
BILIRUBIN TOTAL: 0.8 mg/dL (ref 0.3–1.2)
BUN: 15 mg/dL (ref 6–20)
CO2: 27 mmol/L (ref 22–32)
Calcium: 9.4 mg/dL (ref 8.9–10.3)
Chloride: 99 mmol/L — ABNORMAL LOW (ref 101–111)
Creatinine, Ser: 1.05 mg/dL (ref 0.61–1.24)
GFR calc Af Amer: >60 mL/min (ref >60)
GFR calc non Af Amer: >60 mL/min (ref >60)
GLUCOSE: 115 mg/dL — AB (ref 65–99)
POTASSIUM: 3.8 mmol/L (ref 3.5–5.1)
Sodium: 135 mmol/L (ref 135–145)
TOTAL PROTEIN: 8 g/dL (ref 6.5–8.1)

## 2015-07-14 LAB — CBC
HEMATOCRIT: 41.2 % (ref 40.0–52.0)
Hemoglobin: 14.1 g/dL (ref 13.0–18.0)
MCH: 31.7 pg (ref 26.0–34.0)
MCHC: 34.2 g/dL (ref 32.0–36.0)
MCV: 92.8 fL (ref 80.0–100.0)
Platelets: 356 10*3/uL (ref 150–440)
RBC: 4.45 MIL/uL (ref 4.40–5.90)
RDW: 13.7 % (ref 11.5–14.5)
WBC: 13.8 10*3/uL — ABNORMAL HIGH (ref 3.8–10.6)

## 2015-07-14 LAB — LIPASE, BLOOD: LIPASE: 21 U/L (ref 11–51)

## 2015-07-14 LAB — LACTIC ACID, PLASMA: LACTIC ACID, VENOUS: 0.8 mmol/L (ref 0.5–2.0)

## 2015-07-14 SURGERY — INCISION AND DRAINAGE, ABSCESS, PERIRECTAL
Anesthesia: General | Wound class: Dirty or Infected

## 2015-07-14 MED ORDER — KETOROLAC TROMETHAMINE 30 MG/ML IJ SOLN
INTRAMUSCULAR | Status: DC | PRN
  Administered 2015-07-14: 30 mg via INTRAVENOUS

## 2015-07-14 MED ORDER — PROPOFOL 10 MG/ML IV BOLUS
INTRAVENOUS | Status: DC | PRN
  Administered 2015-07-14: 150 mg via INTRAVENOUS
  Administered 2015-07-14: 30 mg via INTRAVENOUS

## 2015-07-14 MED ORDER — BENZTROPINE MESYLATE 1 MG PO TABS
0.5000 mg | ORAL_TABLET | Freq: Every day | ORAL | Status: DC | PRN
  Filled 2015-07-14: qty 1

## 2015-07-14 MED ORDER — METRONIDAZOLE IN NACL 5-0.79 MG/ML-% IV SOLN
INTRAVENOUS | Status: DC | PRN
  Administered 2015-07-14: 500 mg via INTRAVENOUS

## 2015-07-14 MED ORDER — IOHEXOL 240 MG/ML SOLN
25.0000 mL | Freq: Once | INTRAMUSCULAR | Status: AC | PRN
  Administered 2015-07-14: 25 mL via ORAL

## 2015-07-14 MED ORDER — ROCURONIUM BROMIDE 100 MG/10ML IV SOLN
INTRAVENOUS | Status: DC | PRN
  Administered 2015-07-14: 5 mg via INTRAVENOUS

## 2015-07-14 MED ORDER — MIDAZOLAM HCL 5 MG/5ML IJ SOLN
INTRAMUSCULAR | Status: DC | PRN
  Administered 2015-07-14: 2 mg via INTRAVENOUS

## 2015-07-14 MED ORDER — FENTANYL CITRATE (PF) 100 MCG/2ML IJ SOLN
25.0000 ug | INTRAMUSCULAR | Status: DC | PRN
  Administered 2015-07-14 (×4): 25 ug via INTRAVENOUS

## 2015-07-14 MED ORDER — ONDANSETRON HCL 4 MG/2ML IJ SOLN: 4.0000 mg | Freq: Once | INTRAMUSCULAR | Status: DC | PRN

## 2015-07-14 MED ORDER — SODIUM CHLORIDE 0.9 % IV BOLUS (SEPSIS): 1000.0000 mL | Freq: Once | INTRAVENOUS | Status: DC

## 2015-07-14 MED ORDER — PHENYLEPHRINE HCL 10 MG/ML IJ SOLN
INTRAMUSCULAR | Status: DC | PRN
  Administered 2015-07-14 (×2): 100 ug via INTRAVENOUS

## 2015-07-14 MED ORDER — SENNA 8.6 MG PO TABS
1.0000 | ORAL_TABLET | ORAL | Status: AC
  Filled 2015-07-14: qty 1

## 2015-07-14 MED ORDER — SENNOSIDES-DOCUSATE SODIUM 8.6-50 MG PO TABS
ORAL_TABLET | ORAL | Status: AC
  Administered 2015-07-14: 1
  Filled 2015-07-14: qty 1

## 2015-07-14 MED ORDER — SODIUM CHLORIDE 0.9 % IV BOLUS (SEPSIS)
1000.0000 mL | Freq: Once | INTRAVENOUS | Status: AC
  Administered 2015-07-14: 1000 mL via INTRAVENOUS

## 2015-07-14 MED ORDER — TRAZODONE HCL 50 MG PO TABS
50.0000 mg | ORAL_TABLET | Freq: Every day | ORAL | Status: DC
  Administered 2015-07-14: 50 mg via ORAL
  Filled 2015-07-14 (×3): qty 1

## 2015-07-14 MED ORDER — OXYCODONE-ACETAMINOPHEN 5-325 MG PO TABS
1.0000 | ORAL_TABLET | Freq: Four times a day (QID) | ORAL | Status: DC | PRN
  Administered 2015-07-14 – 2015-07-15 (×2): 1 via ORAL
  Filled 2015-07-14 (×2): qty 1

## 2015-07-14 MED ORDER — FENTANYL CITRATE (PF) 100 MCG/2ML IJ SOLN
25.0000 ug | Freq: Once | INTRAMUSCULAR | Status: AC
  Administered 2015-07-14: 25 ug via INTRAVENOUS
  Filled 2015-07-14: qty 2

## 2015-07-14 MED ORDER — HALOPERIDOL 5 MG PO TABS
5.0000 mg | ORAL_TABLET | Freq: Every day | ORAL | Status: DC
  Administered 2015-07-14: 5 mg via ORAL
  Filled 2015-07-14 (×4): qty 1

## 2015-07-14 MED ORDER — BENZTROPINE MESYLATE 1 MG PO TABS
1.0000 mg | ORAL_TABLET | Freq: Every day | ORAL | Status: DC
  Administered 2015-07-14: 1 mg via ORAL
  Filled 2015-07-14 (×4): qty 1

## 2015-07-14 MED ORDER — METRONIDAZOLE IN NACL 5-0.79 MG/ML-% IV SOLN
500.0000 mg | Freq: Once | INTRAVENOUS | Status: DC
  Filled 2015-07-14 (×2): qty 100

## 2015-07-14 MED ORDER — CIPROFLOXACIN IN D5W 400 MG/200ML IV SOLN
400.0000 mg | Freq: Once | INTRAVENOUS | Status: AC
  Administered 2015-07-14: 400 mg via INTRAVENOUS
  Filled 2015-07-14 (×2): qty 200

## 2015-07-14 MED ORDER — FENTANYL CITRATE (PF) 100 MCG/2ML IJ SOLN
INTRAMUSCULAR | Status: AC
  Administered 2015-07-14: 25 ug via INTRAVENOUS
  Filled 2015-07-14: qty 2

## 2015-07-14 MED ORDER — ACETAMINOPHEN 500 MG PO TABS
1000.0000 mg | ORAL_TABLET | ORAL | Status: AC
  Administered 2015-07-14: 1000 mg via ORAL
  Filled 2015-07-14: qty 2

## 2015-07-14 MED ORDER — CEFOXITIN SODIUM 2 G IV SOLR
2.0000 g | Freq: Once | INTRAVENOUS | Status: DC
  Filled 2015-07-14: qty 2

## 2015-07-14 MED ORDER — HYDROMORPHONE HCL 1 MG/ML IJ SOLN
0.5000 mg | INTRAMUSCULAR | Status: DC | PRN
  Administered 2015-07-14: 0.5 mg via INTRAVENOUS
  Filled 2015-07-14: qty 1

## 2015-07-14 MED ORDER — ONDANSETRON HCL 4 MG/2ML IJ SOLN
INTRAMUSCULAR | Status: DC | PRN
  Administered 2015-07-14: 4 mg via INTRAVENOUS

## 2015-07-14 MED ORDER — POLYETHYLENE GLYCOL 3350 17 G PO PACK
17.0000 g | PACK | Freq: Every day | ORAL | Status: DC | PRN
  Filled 2015-07-14: qty 1

## 2015-07-14 MED ORDER — LIDOCAINE HCL (CARDIAC) 20 MG/ML IV SOLN
INTRAVENOUS | Status: DC | PRN
  Administered 2015-07-14: 60 mg via INTRAVENOUS

## 2015-07-14 MED ORDER — LACTATED RINGERS IV SOLN
INTRAVENOUS | Status: DC | PRN
Start: 2015-07-14 — End: 2015-07-14
  Administered 2015-07-14: 16:00:00 via INTRAVENOUS

## 2015-07-14 MED ORDER — FENTANYL CITRATE (PF) 100 MCG/2ML IJ SOLN
INTRAMUSCULAR | Status: DC | PRN
  Administered 2015-07-14 (×5): 50 ug via INTRAVENOUS

## 2015-07-14 MED ORDER — SUCCINYLCHOLINE CHLORIDE 20 MG/ML IJ SOLN
INTRAMUSCULAR | Status: DC | PRN
  Administered 2015-07-14: 100 mg via INTRAVENOUS

## 2015-07-14 MED ORDER — LACTATED RINGERS IV SOLN
INTRAVENOUS | Status: DC
  Administered 2015-07-14 – 2015-07-15 (×2): via INTRAVENOUS

## 2015-07-14 MED ORDER — IOHEXOL 300 MG/ML  SOLN: 100.0000 mL | Freq: Once | INTRAMUSCULAR | Status: DC | PRN

## 2015-07-14 SURGICAL SUPPLY — 23 items
BLADE SURG 15 STRL LF DISP TIS (BLADE) ×1 IMPLANT
BLADE SURG 15 STRL SS (BLADE) ×3
BLADE SURG SZ11 CARB STEEL (BLADE) ×3 IMPLANT
BRIEF STRETCH MATERNITY 2XLG (MISCELLANEOUS) ×3 IMPLANT
CANISTER SUCT 1200ML W/VALVE (MISCELLANEOUS) ×3 IMPLANT
DRAIN PENROSE 1/4X12 LTX (DRAIN) ×3 IMPLANT
DRAPE LAPAROTOMY 100X77 ABD (DRAPES) ×3 IMPLANT
DRAPE LEGGINS SURG 28X43 STRL (DRAPES) ×3 IMPLANT
DRAPE UNDER BUTTOCK W/FLU (DRAPES) ×3 IMPLANT
GAUZE IODOFORM PACK 1/2 7832 (GAUZE/BANDAGES/DRESSINGS) ×3 IMPLANT
GAUZE SPONGE 4X4 12PLY STRL (GAUZE/BANDAGES/DRESSINGS) ×3 IMPLANT
GLOVE BIO SURGEON STRL SZ8 (GLOVE) ×6 IMPLANT
GOWN STRL REUS W/ TWL LRG LVL3 (GOWN DISPOSABLE) ×2 IMPLANT
GOWN STRL REUS W/TWL LRG LVL3 (GOWN DISPOSABLE) ×6
KIT RM TURNOVER STRD PROC AR (KITS) ×3 IMPLANT
LABEL OR SOLS (LABEL) ×3 IMPLANT
NS IRRIG 500ML POUR BTL (IV SOLUTION) ×3 IMPLANT
PACK BASIN MINOR ARMC (MISCELLANEOUS) ×3 IMPLANT
PAD ABD DERMACEA PRESS 5X9 (GAUZE/BANDAGES/DRESSINGS) ×6 IMPLANT
PREP PVP WINGED SPONGE (MISCELLANEOUS) ×3 IMPLANT
SUT ETH BLK MONO 3 0 FS 1 12/B (SUTURE) ×3 IMPLANT
SUT NYLON 2-0 (SUTURE) ×3 IMPLANT
SWAB CULTURE AMIES ANAERIB BLU (MISCELLANEOUS) ×3 IMPLANT

## 2015-07-14 NOTE — ED Notes (Signed)
Pt still has rectal pressure

## 2015-07-14 NOTE — ED Notes (Signed)
MD at bedside. 

## 2015-07-14 NOTE — Op Note (Signed)
07/14/2015  5:02 PM  PATIENT:  Charles Charles  55 y.o. male  PRE-OPERATIVE DIAGNOSIS: Perirectal abscess  POST-OPERATIVE DIAGNOSIS:  Same  PROCEDURE: Examination under anesthesia and incision and drainage of perirectal abscess  SURGEON:  Lattie Hawichard E Katelyn Broadnax MD, FACS   ANESTHESIA:   Gen. with endotracheal tube   Details of Procedure: This patient with a perirectal abscess actually a horseshoe-type abscess on CT scan. Preoperatively discussed rationale for surgery the options of observation risk bleeding infection recurrence he understood and agreed to proceed  Findings extensive cavity extending on both sides of the rectum. 2 Penrose drains placed.  Description of procedure patient was discharged to general anesthesia placed in the high lithotomy position and a surgical pause was performed. A rectal exam was performed digitally. An 18-gauge needle was advanced perianally on the right side and purulence was identified this was taken for culture. An incision was made with 11 blade and clamp was placed and which exuded a large amount of pus. This clamp was then placed across posteriorly to the left side where another extensive pocket was identified this was irrigated and then into each pocket through the same incision was placed a quarter inch Penrose drain which is held in with a single 3-0 nylon suture.  Sterile dressing was placed patient was taken down from high lithotomy position and taken to the recovery room in stable condition sponge lap needle count was correct.  Lattie Hawichard E Jahad Old, MD FACS

## 2015-07-14 NOTE — Anesthesia Procedure Notes (Signed)
Procedure Name: Intubation Date/Time: 07/14/2015 4:33 PM Performed by: Lily KocherPERALTA, Kamonte Mcmichen Pre-anesthesia Checklist: Patient identified, Patient being monitored, Timeout performed, Emergency Drugs available and Suction available Patient Re-evaluated:Patient Re-evaluated prior to inductionOxygen Delivery Method: Circle system utilized Preoxygenation: Pre-oxygenation with 100% oxygen Intubation Type: IV induction Ventilation: Mask ventilation without difficulty Laryngoscope Size: Mac and 4 Grade View: Grade II Tube type: Oral Tube size: 7.5 mm Number of attempts: 1 Airway Equipment and Method: Stylet Placement Confirmation: ETT inserted through vocal cords under direct vision,  positive ETCO2 and breath sounds checked- equal and bilateral Secured at: 23 cm Tube secured with: Tape Dental Injury: Teeth and Oropharynx as per pre-operative assessment

## 2015-07-14 NOTE — ED Notes (Signed)
[facility]

## 2015-07-14 NOTE — ED Notes (Addendum)
Pt states he was seen here last night with fecal impaction, states when he woke this morning the rectal pain returned and he is not able to pass the stool.. Pt appears very uncomfortable when sitting. Pt has unfilled Rx in hand, states he does not have the financial capability to get meds filled.

## 2015-07-14 NOTE — Transfer of Care (Signed)
Immediate Anesthesia Transfer of Care Note  Patient: Charles Charles  Procedure(s) Performed: Procedure(s): IRRIGATION AND DEBRIDEMENT PERIRECTAL ABSCESS (N/A)  Patient Location: PACU  Anesthesia Type:General  Level of Consciousness: sedated  Airway & Oxygen Therapy: Patient Spontanous Breathing and Patient connected to face mask oxygen  Post-op Assessment: Report given to RN  Post vital signs: Reviewed and stable  Last Vitals:  Filed Vitals:   07/14/15 1445 07/14/15 1706  BP: 104/66 88/48  Pulse: 83 74  Temp:  36.8 C  Resp:  17    Complications: No apparent anesthesia complications

## 2015-07-14 NOTE — Progress Notes (Signed)
Charles Charles is an 55 y.o. male.    Chief Complaint: Rectal pain  HPI: This a patient with 5 days of increasing rectal pain he was in the ED yesterday and diagnosed with constipation and he had a bowel movement but that did not relieve his pain and in fact it is worsening he's had some chills but no fevers has not had an episode like this before denies nausea vomiting and no melena or hematochezia his pain is perianal and worsening.  Past Medical History  Diagnosis Date  . Schizo-affective schizophrenia (HCC)     History reviewed. No pertinent past surgical history.  No family history on file. Social History:  reports that he has been smoking Cigarettes.  He has been smoking about 0.10 packs per day. He does not have any smokeless tobacco history on file. He reports that he drinks about 33.6 oz of alcohol per week. His drug history is not on file.  Allergies:  Allergies  Allergen Reactions  . Penicillins Swelling     (Not in a hospital admission)   Review of Systems  Constitutional: Positive for chills and malaise/fatigue. Negative for fever and weight loss.  HENT: Negative.   Eyes: Negative.   Respiratory: Negative.   Cardiovascular: Negative.   Gastrointestinal: Positive for constipation. Negative for heartburn, nausea, vomiting, abdominal pain, diarrhea, blood in stool and melena.  Genitourinary: Negative.   Musculoskeletal: Negative.   Skin: Negative.   Neurological: Negative.   Endo/Heme/Allergies: Negative.   Psychiatric/Behavioral: Negative.      Physical Exam:  BP 104/66 mmHg  Pulse 83  Temp(Src) 98.2 F (36.8 C) (Oral)  Resp 20  Ht 5\' 2"  (1.575 m)  Wt 170 lb (77.111 kg)  BMI 31.09 kg/m2  SpO2 99%  Physical Exam  Constitutional: He is oriented to person, place, and time and well-developed, well-nourished, and in no distress. No distress.  Appears uncomfortable  HENT:  Head: Normocephalic and atraumatic.  Eyes: Pupils are equal, round, and reactive  to light. Right eye exhibits no discharge. Left eye exhibits no discharge. No scleral icterus.  Neck: Normal range of motion.  Cardiovascular: Normal rate, regular rhythm and normal heart sounds.   Pulmonary/Chest: Effort normal and breath sounds normal. No respiratory distress. He has no wheezes. He has no rales.  Abdominal: Soft. Bowel sounds are normal. He exhibits no distension. There is no tenderness. There is no rebound and no guarding.  Genitourinary:  Severe perirectal tenderness with erythema mostly posteriorly  Musculoskeletal: Normal range of motion. He exhibits no edema or tenderness.  Lymphadenopathy:    He has no cervical adenopathy.  Neurological: He is alert and oriented to person, place, and time.  Skin: Skin is warm and dry. He is not diaphoretic. No erythema.  Psychiatric: Mood and affect normal.  Vitals reviewed.       Results for orders placed or performed during the hospital encounter of 07/14/15 (from the past 48 hour(s))  CBC     Status: Abnormal   Collection Time: 07/14/15 10:57 AM  Result Value Ref Range   WBC 13.8 (H) 3.8 - 10.6 K/uL   RBC 4.45 4.40 - 5.90 MIL/uL   Hemoglobin 14.1 13.0 - 18.0 g/dL   HCT 07/16/15 59.7 - 89.6 %   MCV 92.8 80.0 - 100.0 fL   MCH 31.7 26.0 - 34.0 pg   MCHC 34.2 32.0 - 36.0 g/dL   RDW 45.2 73.9 - 95.9 %   Platelets 356 150 - 440 K/uL  Comprehensive metabolic  panel     Status: Abnormal   Collection Time: 07/14/15 10:57 AM  Result Value Ref Range   Sodium 135 135 - 145 mmol/L   Potassium 3.8 3.5 - 5.1 mmol/L   Chloride 99 (L) 101 - 111 mmol/L   CO2 27 22 - 32 mmol/L   Glucose, Bld 115 (H) 65 - 99 mg/dL   BUN 15 6 - 20 mg/dL   Creatinine, Ser 1.05 0.61 - 1.24 mg/dL   Calcium 9.4 8.9 - 10.3 mg/dL   Total Protein 8.0 6.5 - 8.1 g/dL   Albumin 3.3 (L) 3.5 - 5.0 g/dL   AST 70 (H) 15 - 41 U/L   ALT 64 (H) 17 - 63 U/L   Alkaline Phosphatase 79 38 - 126 U/L   Total Bilirubin 0.8 0.3 - 1.2 mg/dL   GFR calc non Af Amer >60 >60  mL/min   GFR calc Af Amer >60 >60 mL/min    Comment: (NOTE) The eGFR has been calculated using the CKD EPI equation. This calculation has not been validated in all clinical situations. eGFR's persistently <60 mL/min signify possible Chronic Kidney Disease.    Anion gap 9 5 - 15  Lipase, blood     Status: None   Collection Time: 07/14/15 10:57 AM  Result Value Ref Range   Lipase 21 11 - 51 U/L   Ct Abdomen Pelvis W Contrast  07/14/2015  CLINICAL DATA:  55 year old male with right side abdominal pain and bloating for 3 days. Recent fecal impaction and difficulty passing stool. Initial encounter. EXAM: CT ABDOMEN AND PELVIS WITH CONTRAST TECHNIQUE: Multidetector CT imaging of the abdomen and pelvis was performed using the standard protocol following bolus administration of intravenous contrast. CONTRAST:  100 mL Omnipaque 300 COMPARISON:  Abdominal series 11217 and earlier. FINDINGS: Mild dependent atelectasis or scarring at the lung bases. No pericardial or pleural effusion. Chronic left lateral seventh rib fracture. No acute osseous abnormality identified. Mild presacral stranding with no free fluid in the pelvis. There is a rim enhancing crescent-shaped perianal fluid collection, occupying the posterior and lateral aspects of the pelvic floor (series 2, images 81-88 and series 4 image 4. This encompasses 49 x 60 x 37 mm (AP by transverse by CC). Mild to moderate distal rectum circumferential wall thickening (series 2, image 74). There may be a fistulous connection along the anterior extent (6 o'clock position of the lumen - on series 2, image 82), uncertain. There is no extension to the gluteal cleft. There is surrounding perineum soft tissue stranding. The lesion is in proximity to the coccyx which appears intact (sagittal image 83). Unrelated posterior left thigh probable sebaceous cyst on series 2, image 85. No inguinal lymphadenopathy. The proximal rectum appears normal. Negative urinary bladder.  Redundant sigmoid colon partially distended with gas and stool. Similar mild gas and stool distension of the left colon and transverse colon. Decompressed right colon. Normal appendix. Oral contrast has just reached the cecum. No dilated or abnormal small bowel loops. Decompressed stomach. No abdominal free air or free fluid. Liver, gallbladder, spleen, pancreas, and adrenal glands. Portal venous system is patent. Major arterial structures in the abdomen and pelvis appear normal. Mild left common femoral artery calcified atherosclerosis. Bilateral renal enhancement and contrast excretion within normal limits. Small left renal lower pole benign appearing cortical cyst. No lymphadenopathy in the abdomen or pelvis. IMPRESSION: 1. Crescent-shaped posteriorly situated but nearly circumferential perianal abscess encompassing 4.9 x 6.0 x 3.7 cm. Associated mild presacral stranding and thickening  of the distal rectum. 2. Otherwise negative abdomen and pelvis. Electronically Signed   By: Genevie Ann M.D.   On: 07/14/2015 13:50   Dg Abd 2 Views  07/13/2015  CLINICAL DATA:  Abdominal pain.  Constipation for 5 days. EXAM: ABDOMEN - 2 VIEW COMPARISON:  Chest in two views abdomen 01/17/2014. FINDINGS: The bowel gas pattern is nonobstructive. There is no free intraperitoneal air. A large volume of stool is present throughout the colon, particularly the right colon. IMPRESSION: No acute abnormality. Large colonic stool burden most notable in the right colon. Electronically Signed   By: Inge Rise M.D.   On: 07/13/2015 20:00     Assessment/Plan  CT personally reviewed large abscess somewhat crescent-shaped horseshoe perianal abscess. This consistent with his physical exam. Recommend incision and drainage. The risks were delineated in detail as were the options he understood and agreed to proceed he is been nothing by mouth IV antibiotics will be started and scheduled.  Florene Glen, MD, FACS

## 2015-07-14 NOTE — ED Provider Notes (Signed)
[facility] Emergency Department Provider Note  ____________________________________________  Time seen: Approximately 11:43 AM  I have reviewed the triage vital signs and the nursing notes.   HISTORY  Chief Complaint Constipation    HPI Charles Charles is a 55 y.o. male previous history of schizophrenia.  Patient reports for the last 4-5 days he's been quite "constipated". He came to the emergency room yesterday and had a bowel movement, but he reports he still has ongoing pain mostly over the right side of the abdomen. This is not associated with any nausea vomiting chest pain fevers or chills. He otherwise feels well but reports that he just feels very constipated still. He has never had a colonoscopy.   Past Medical History  Diagnosis Date  . Schizo-affective schizophrenia (HCC)     There are no active problems to display for this patient.   History reviewed. No pertinent past surgical history.  Current Outpatient Rx  Name  Route  Sig  Dispense  Refill  . oxyCODONE-acetaminophen (ROXICET) 5-325 MG per tablet   Oral   Take 1 tablet by mouth every 6 (six) hours as needed for severe pain.   8 tablet   0   . polyethylene glycol (MIRALAX) packet   Oral   Take 17 g by mouth daily as needed for mild constipation or moderate constipation.   14 each   0     Allergies Penicillins  No family history on file.  Social History Social History  Substance Use Topics  . Smoking status: Current Every Day Smoker -- 0.10 packs/day    Types: Cigarettes  . Smokeless tobacco: None  . Alcohol Use: 33.6 oz/week    56 Cans of beer per week     Comment: 2 x 40's dailey    Review of Systems Constitutional: No fever/chills Eyes: No visual changes. ENT: No sore throat. Cardiovascular: Denies chest pain. Respiratory: Denies shortness of breath. Gastrointestinal: No nausea, no vomiting.  No diarrhea.  No constipation. Genitourinary: Negative for  dysuria. Musculoskeletal: Negative for back pain. Skin: Negative for rash. Neurological: Negative for headaches, focal weakness or numbness.  10-point ROS otherwise negative.  ____________________________________________   PHYSICAL EXAM:  VITAL SIGNS: ED Triage Vitals  Enc Vitals Group     BP 07/14/15 1029 101/53 mmHg     Pulse Rate 07/14/15 1029 103     Resp 07/14/15 1029 20     Temp 07/14/15 1029 98.2 F (36.8 C)     Temp Source 07/14/15 1029 Oral     SpO2 07/14/15 1029 97 %     Weight 07/14/15 1029 170 lb (77.111 kg)     Height 07/14/15 1029 5\' 2"  (1.575 m)     Head Cir --      Peak Flow --      Pain Score 07/14/15 1029 10     Pain Loc --      Pain Edu? --      Excl. in GC? --    Constitutional: Alert and oriented. Well appearing and in no acute distress. Eyes: Conjunctivae are normal. PERRL. EOMI. Head: Atraumatic. Nose: No congestion/rhinnorhea. Mouth/Throat: Mucous membranes are moist.  Oropharynx non-erythematous. Neck: No stridor.   Cardiovascular: Normal rate, regular rhythm. Grossly normal heart sounds.  Good peripheral circulation. Respiratory: Normal respiratory effort.  No retractions. Lungs CTAB. Gastrointestinal: Soft and nontender set for some moderate tenderness along the right mid abdomen without rebound or guarding. No distention. Normal bowel sounds. Musculoskeletal: No lower extremity tenderness  nor edema.  No joint effusions. Neurologic:  Normal speech and language. No gross focal neurologic deficits are appreciated. Skin:  Skin is warm, dry and intact. No rash noted. Psychiatric: Mood and affect are normal. Speech and behavior are normal.  ____________________________________________   LABS (all labs ordered are listed, but only abnormal results are displayed)  Labs Reviewed  CBC - Abnormal; Notable for the following:    WBC 13.8 (*)    All other components within normal limits  COMPREHENSIVE METABOLIC PANEL - Abnormal; Notable for the  following:    Chloride 99 (*)    Glucose, Bld 115 (*)    Albumin 3.3 (*)    AST 70 (*)    ALT 64 (*)    All other components within normal limits  CULTURE, BLOOD (ROUTINE X 2)  CULTURE, BLOOD (ROUTINE X 2)  LIPASE, BLOOD  LACTIC ACID, PLASMA  LACTIC ACID, PLASMA   ____________________________________________  EKG   ____________________________________________  RADIOLOGY  CT Abdomen Pelvis W Contrast (Final result) Result time: 07/14/15 13:50:13   Final result by Rad Results In Interface (07/14/15 13:50:13)   Narrative:   CLINICAL DATA: 55 year old male with right side abdominal pain and bloating for 3 days. Recent fecal impaction and difficulty passing stool. Initial encounter.  EXAM: CT ABDOMEN AND PELVIS WITH CONTRAST  TECHNIQUE: Multidetector CT imaging of the abdomen and pelvis was performed using the standard protocol following bolus administration of intravenous contrast.  CONTRAST: 100 mL Omnipaque 300  COMPARISON: Abdominal series 96045 and earlier.  FINDINGS: Mild dependent atelectasis or scarring at the lung bases. No pericardial or pleural effusion.  Chronic left lateral seventh rib fracture. No acute osseous abnormality identified.  Mild presacral stranding with no free fluid in the pelvis. There is a rim enhancing crescent-shaped perianal fluid collection, occupying the posterior and lateral aspects of the pelvic floor (series 2, images 81-88 and series 4 image 4. This encompasses 49 x 60 x 37 mm (AP by transverse by CC). Mild to moderate distal rectum circumferential wall thickening (series 2, image 74). There may be a fistulous connection along the anterior extent (6 o'clock position of the lumen - on series 2, image 82), uncertain. There is no extension to the gluteal cleft. There is surrounding perineum soft tissue stranding. The lesion is in proximity to the coccyx which appears intact (sagittal image 83).  Unrelated posterior left  thigh probable sebaceous cyst on series 2, image 85.  No inguinal lymphadenopathy. The proximal rectum appears normal. Negative urinary bladder. Redundant sigmoid colon partially distended with gas and stool. Similar mild gas and stool distension of the left colon and transverse colon. Decompressed right colon. Normal appendix. Oral contrast has just reached the cecum. No dilated or abnormal small bowel loops. Decompressed stomach.  No abdominal free air or free fluid. Liver, gallbladder, spleen, pancreas, and adrenal glands. Portal venous system is patent. Major arterial structures in the abdomen and pelvis appear normal. Mild left common femoral artery calcified atherosclerosis. Bilateral renal enhancement and contrast excretion within normal limits. Small left renal lower pole benign appearing cortical cyst. No lymphadenopathy in the abdomen or pelvis.  IMPRESSION: 1. Crescent-shaped posteriorly situated but nearly circumferential perianal abscess encompassing 4.9 x 6.0 x 3.7 cm. Associated mild presacral stranding and thickening of the distal rectum. 2. Otherwise negative abdomen and pelvis.   Electronically Signed By: Odessa Fleming M.D. On: 07/14/2015 13:50    ____________________________________________   PROCEDURES  Procedure(s) performed: None  Critical Care performed: No  ____________________________________________  INITIAL IMPRESSION / ASSESSMENT AND PLAN / ED COURSE  Pertinent labs & imaging results that were available during my care of the patient were reviewed by me and considered in my medical decision making (see chart for details).  Patient reports ongoing constipation and right-sided abdominal pain. The setting of a repeat visit to the ER in 24 hours and reportedly little relief in his symptoms after a moderate bowel movement yesterday, I would consider other diagnosis though felt to be most likely constipation other concerns would include larger  infiltrative mass, diverticulitis of the right side, appendicitis, etc. He does appear to be hemodynamically stable and is in no distress but does have focal right-sided pain. Discussed risks and benefits, and after discussion patient opted to have CT scan. He has never had a colonoscopy. I did discuss with him that he should set up a colonoscopy as he will need further investigation with the next 1-2 months even if his CAT scan is normal to make sure there is not a tumor or polyp or other cause. He is agreeable with this.  ----------------------------------------- 2:29 PM on 07/14/2015 -----------------------------------------  CT scan concerning for perirectal abscess. Patient does have moderate hypotension, but is also resting. He will awake since its up and is conversant on reexam. He does have significant perianal tenderness without overlying skin change. Have asked Dr. Excell Seltzerooper of surgery to evaluate and treat. We'll send blood cultures and lactate. Await general surgery consult.  ----------------------------------------- 2:43 PM on 07/14/2015 -----------------------------------------  Patient blood pressure currently improving after fluids, SBP now > 100. Dr. Excell Seltzerooper seeing patient in room at this time.  ----------------------------------------- 2:45 PM on 07/14/2015 -----------------------------------------  Dr. Excell Seltzerooper admitting patient.  ____________________________________________   FINAL CLINICAL IMPRESSION(S) / ED DIAGNOSES  Final diagnoses:  Abscess, perirectal      Sharyn CreamerMark Valarie Farace, MD 07/14/15 1445

## 2015-07-14 NOTE — ED Notes (Signed)
To or via or tech

## 2015-07-14 NOTE — Anesthesia Preprocedure Evaluation (Signed)
Anesthesia Evaluation  Patient identified by MRN, date of birth, ID band Patient awake    Reviewed: Allergy & Precautions, NPO status , Patient's Chart, lab work & pertinent test results  Airway Mallampati: II  TM Distance: >3 FB Neck ROM: Full    Dental  (+) Poor Dentition, Chipped, Missing   Pulmonary Current Smoker,    Pulmonary exam normal breath sounds clear to auscultation       Cardiovascular negative cardio ROS       Neuro/Psych PSYCHIATRIC DISORDERS Schizophrenia negative neurological ROS     GI/Hepatic Neg liver ROS, Perirectal abcess   Endo/Other  negative endocrine ROS  Renal/GU negative Renal ROS  negative genitourinary   Musculoskeletal negative musculoskeletal ROS (+)   Abdominal Normal abdominal exam  (+)   Peds negative pediatric ROS (+)  Hematology negative hematology ROS (+)   Anesthesia Other Findings   Reproductive/Obstetrics                             Anesthesia Physical Anesthesia Plan  ASA: II and emergent  Anesthesia Plan: General   Post-op Pain Management:    Induction: Intravenous and Rapid sequence  Airway Management Planned: Oral ETT  Additional Equipment:   Intra-op Plan:   Post-operative Plan: Extubation in OR  Informed Consent: I have reviewed the patients History and Physical, chart, labs and discussed the procedure including the risks, benefits and alternatives for the proposed anesthesia with the patient or authorized representative who has indicated his/her understanding and acceptance.   Dental advisory given  Plan Discussed with: CRNA and Surgeon  Anesthesia Plan Comments:         Anesthesia Quick Evaluation

## 2015-07-15 MED ORDER — OXYCODONE-ACETAMINOPHEN 5-325 MG PO TABS: 1.0000 | ORAL_TABLET | Freq: Four times a day (QID) | ORAL | Status: DC | PRN

## 2015-07-15 MED ORDER — CIPROFLOXACIN HCL 250 MG PO TABS
500.0000 mg | ORAL_TABLET | Freq: Two times a day (BID) | ORAL | Status: DC
  Administered 2015-07-15: 500 mg via ORAL
  Filled 2015-07-15: qty 2

## 2015-07-15 MED ORDER — METRONIDAZOLE 500 MG PO TABS
500.0000 mg | ORAL_TABLET | Freq: Three times a day (TID) | ORAL | Status: DC
  Administered 2015-07-15: 500 mg via ORAL
  Filled 2015-07-15: qty 1

## 2015-07-15 MED ORDER — CIPROFLOXACIN HCL 500 MG PO TABS: 500.0000 mg | ORAL_TABLET | Freq: Two times a day (BID) | ORAL | Status: DC

## 2015-07-15 MED ORDER — METRONIDAZOLE 500 MG PO TABS: 500.0000 mg | ORAL_TABLET | Freq: Three times a day (TID) | ORAL | Status: DC

## 2015-07-15 NOTE — Discharge Instructions (Signed)
May shower Regular diet Dry dressing as needed Follow up Dr Excell Seltzerooper Friday  Take medications as prescribed.  Please contact your doctor's office if you should have pain that does not improve after taking your prescribed pain medication, nausea/vomiting, fever and if you should have any questions or concerns.

## 2015-07-15 NOTE — Progress Notes (Signed)
1 Day Post-Op  Subjective: Patient feels better after drainage of a perirectal abscess he has Penrose is in place. Tolerating a regular diet.  Objective: Vital signs in last 24 hours: Temp:  [98.1 F (36.7 C)-100.5 F (38.1 C)] 98.4 F (36.9 C) (01/14 0801) Pulse Rate:  [70-103] 81 (01/14 0508) Resp:  [14-20] 16 (01/14 0508) BP: (81-167)/(45-78) 100/50 mmHg (01/14 0519) SpO2:  [94 %-100 %] 95 % (01/14 0508) Weight:  [170 lb (77.111 kg)] 170 lb (77.111 kg) (01/13 1029)    Intake/Output from previous day: 01/13 0701 - 01/14 0700 In: 1150.2 [I.V.:1150.2] Out: 760 [Urine:750; Blood:10] Intake/Output this shift: Total I/O In: 224 [I.V.:224] Out: 0   Physical exam:  Wound is clean Penrose are in place.  Lab Results: CBC   Recent Labs  07/14/15 1057  WBC 13.8*  HGB 14.1  HCT 41.2  PLT 356   BMET  Recent Labs  07/14/15 1057  NA 135  K 3.8  CL 99*  CO2 27  GLUCOSE 115*  BUN 15  CREATININE 1.05  CALCIUM 9.4   PT/INR No results for input(s): LABPROT, INR in the last 72 hours. ABG No results for input(s): PHART, HCO3 in the last 72 hours.  Invalid input(s): PCO2, PO2  Studies/Results: Ct Abdomen Pelvis W Contrast  07/14/2015  CLINICAL DATA:  55 year old male with right side abdominal pain and bloating for 3 days. Recent fecal impaction and difficulty passing stool. Initial encounter. EXAM: CT ABDOMEN AND PELVIS WITH CONTRAST TECHNIQUE: Multidetector CT imaging of the abdomen and pelvis was performed using the standard protocol following bolus administration of intravenous contrast. CONTRAST:  100 mL Omnipaque 300 COMPARISON:  Abdominal series 29562 and earlier. FINDINGS: Mild dependent atelectasis or scarring at the lung bases. No pericardial or pleural effusion. Chronic left lateral seventh rib fracture. No acute osseous abnormality identified. Mild presacral stranding with no free fluid in the pelvis. There is a rim enhancing crescent-shaped perianal fluid  collection, occupying the posterior and lateral aspects of the pelvic floor (series 2, images 81-88 and series 4 image 4. This encompasses 49 x 60 x 37 mm (AP by transverse by CC). Mild to moderate distal rectum circumferential wall thickening (series 2, image 74). There may be a fistulous connection along the anterior extent (6 o'clock position of the lumen - on series 2, image 82), uncertain. There is no extension to the gluteal cleft. There is surrounding perineum soft tissue stranding. The lesion is in proximity to the coccyx which appears intact (sagittal image 83). Unrelated posterior left thigh probable sebaceous cyst on series 2, image 85. No inguinal lymphadenopathy. The proximal rectum appears normal. Negative urinary bladder. Redundant sigmoid colon partially distended with gas and stool. Similar mild gas and stool distension of the left colon and transverse colon. Decompressed right colon. Normal appendix. Oral contrast has just reached the cecum. No dilated or abnormal small bowel loops. Decompressed stomach. No abdominal free air or free fluid. Liver, gallbladder, spleen, pancreas, and adrenal glands. Portal venous system is patent. Major arterial structures in the abdomen and pelvis appear normal. Mild left common femoral artery calcified atherosclerosis. Bilateral renal enhancement and contrast excretion within normal limits. Small left renal lower pole benign appearing cortical cyst. No lymphadenopathy in the abdomen or pelvis. IMPRESSION: 1. Crescent-shaped posteriorly situated but nearly circumferential perianal abscess encompassing 4.9 x 6.0 x 3.7 cm. Associated mild presacral stranding and thickening of the distal rectum. 2. Otherwise negative abdomen and pelvis. Electronically Signed   By: Odessa Fleming  M.D.   On: 07/14/2015 13:50   Dg Abd 2 Views  07/13/2015  CLINICAL DATA:  Abdominal pain.  Constipation for 5 days. EXAM: ABDOMEN - 2 VIEW COMPARISON:  Chest in two views abdomen 01/17/2014.  FINDINGS: The bowel gas pattern is nonobstructive. There is no free intraperitoneal air. A large volume of stool is present throughout the colon, particularly the right colon. IMPRESSION: No acute abnormality. Large colonic stool burden most notable in the right colon. Electronically Signed   By: Drusilla Kannerhomas  Dalessio M.D.   On: 07/13/2015 20:00    Anti-infectives: Anti-infectives    Start     Dose/Rate Route Frequency Ordered Stop   07/15/15 0800  ciprofloxacin (CIPRO) tablet 500 mg     500 mg Oral 2 times daily 07/15/15 0708     07/15/15 0715  metroNIDAZOLE (FLAGYL) tablet 500 mg     500 mg Oral 3 times per day 07/15/15 0708     07/15/15 0000  ciprofloxacin (CIPRO) 500 MG tablet     500 mg Oral 2 times daily 07/15/15 0710     07/15/15 0000  metroNIDAZOLE (FLAGYL) 500 MG tablet     500 mg Oral Every 8 hours 07/15/15 0710     07/14/15 1615  ciprofloxacin (CIPRO) IVPB 400 mg     400 mg 200 mL/hr over 60 Minutes Intravenous  Once 07/14/15 1608 07/14/15 1802   07/14/15 1615  metroNIDAZOLE (FLAGYL) IVPB 500 mg     500 mg 100 mL/hr over 60 Minutes Intravenous  Once 07/14/15 1608     07/14/15 1500  cefOXitin (MEFOXIN) 2 g in dextrose 5 % 50 mL IVPB  Status:  Discontinued     2 g 100 mL/hr over 30 Minutes Intravenous  Once 07/14/15 1451 07/14/15 1608      Assessment/Plan: s/p Procedure(s): IRRIGATION AND DEBRIDEMENT PERIRECTAL ABSCESS   Cultures are pending. Patient doing very well recommend discharged today on oral Cipro and Flagyl with oral analgesics and follow-up in my office in 6 days for Penrose removal  Lattie Hawichard E Naythan Douthit, MD, FACS  07/15/2015

## 2015-07-15 NOTE — Discharge Summary (Signed)
Physician Discharge Summary  Patient ID: Charles CaveGarry L Lomax MRN: [id number] DOB/AGE: 56/01/1961 56 y.o.  Admit date: 07/14/2015 Discharge date: 07/15/2015   Discharge Diagnoses:  Active Problems:   Perirectal abscess   Procedures: Drainage of perirectal abscess  Hospital Course: This a patient with a perirectal abscess who is admitted the hospital started on IV antibiotics and taken to the operating room where drainage was performed 2 large cavities were identified one to the right and left of the perirectal area suggesting a horseshoe abscess area Penrose drains were placed and are currently functional. I have recommended discharged today on oral Cipro Flagyl and analgesics with routine drain care and asked her to follow-up in my office at the end of the week for Penrose removal he may shower  Consults: None  Disposition: 01-Home or Self Care     Medication List    TAKE these medications        benztropine 1 MG tablet  Commonly known as:  COGENTIN  Take 1 mg by mouth at bedtime.     benztropine 0.5 MG tablet  Commonly known as:  COGENTIN  Take 0.5 mg by mouth daily as needed (for muscle stiffness).     ciprofloxacin 500 MG tablet  Commonly known as:  CIPRO  Take 1 tablet (500 mg total) by mouth 2 (two) times daily.     haloperidol 5 MG tablet  Commonly known as:  HALDOL  Take 5 mg by mouth at bedtime.     metroNIDAZOLE 500 MG tablet  Commonly known as:  FLAGYL  Take 1 tablet (500 mg total) by mouth every 8 (eight) hours.     oxyCODONE-acetaminophen 5-325 MG tablet  Commonly known as:  ROXICET  Take 1 tablet by mouth every 6 (six) hours as needed for severe pain.     polyethylene glycol packet  Commonly known as:  MIRALAX  Take 17 g by mouth daily as needed for mild constipation or moderate constipation.     traZODone 50 MG tablet  Commonly known as:  DESYREL  Take 50 mg by mouth at bedtime.           Follow-up Information    Follow up with Dionne Miloichard Rmani Kellogg, MD.    Specialty:  Surgery   Why:  For wound re-check   Contact information:   [address] [place] [id number] [phone]       Lattie Hawichard E Meril Dray, MD, FACS

## 2015-07-17 ENCOUNTER — Encounter: Payer: Self-pay | Admitting: Surgery

## 2015-07-17 ENCOUNTER — Telehealth: Payer: Self-pay

## 2015-07-17 LAB — CULTURE, ROUTINE-ABSCESS

## 2015-07-17 NOTE — Telephone Encounter (Signed)
Post discharge call to patient made at this time. The number listed is for patient's mother. She states that she has not seen patient in 4 weeks or more. She contacted the police department to let them know that he is missing about 2 weeks ago but they have been unable to locate him. I gave her my number and asked that if she heard from the patient to please have him call our office.   Patient is in need of drain removal on 1/20 with Dr. Excell Seltzerooper.

## 2015-07-19 LAB — CULTURE, BLOOD (ROUTINE X 2)
CULTURE: NO GROWTH
Culture: NO GROWTH

## 2015-07-20 ENCOUNTER — Encounter: Payer: Self-pay | Admitting: Surgery

## 2015-07-20 ENCOUNTER — Other Ambulatory Visit: Payer: Self-pay

## 2015-07-20 ENCOUNTER — Ambulatory Visit (INDEPENDENT_AMBULATORY_CARE_PROVIDER_SITE_OTHER): Payer: Medicaid Other | Admitting: Surgery

## 2015-07-20 VITALS — BP 104/66 | HR 76 | Temp 98.0°F | Ht 62.0 in | Wt 147.0 lb

## 2015-07-20 DIAGNOSIS — K611 Rectal abscess: Secondary | ICD-10-CM

## 2015-07-20 MED ORDER — OXYCODONE-ACETAMINOPHEN 7.5-325 MG PO TABS: 1.0000 | ORAL_TABLET | ORAL | Status: DC | PRN

## 2015-07-20 MED ORDER — CYCLOBENZAPRINE HCL 10 MG PO TABS: 10.0000 mg | ORAL_TABLET | Freq: Three times a day (TID) | ORAL | Status: DC

## 2015-07-20 NOTE — Anesthesia Postprocedure Evaluation (Signed)
Anesthesia Post Note  Patient: Charles Charles  Procedure(s) Performed: Procedure(s) (LRB): IRRIGATION AND DEBRIDEMENT PERIRECTAL ABSCESS (N/A)  Patient location during evaluation: PACU Anesthesia Type: General Level of consciousness: awake and alert and oriented Pain management: pain level controlled Vital Signs Assessment: post-procedure vital signs reviewed and stable Respiratory status: spontaneous breathing Cardiovascular status: blood pressure returned to baseline Anesthetic complications: no    Last Vitals:  Filed Vitals:   07/15/15 0519 07/15/15 0801  BP: 100/50   Pulse:    Temp:  36.9 C  Resp:      Last Pain:  Filed Vitals:   07/15/15 1053  PainSc: 0-No pain                 Lewi Drost

## 2015-07-20 NOTE — Progress Notes (Signed)
55 year old male with perirectal abscess that was incised last week with Dr. Excell Seltzer.  Patient states he is still having some drainage and pain.   He denies any fever or chills. He states that the pain medicine helps somewhat but doesn't take his pain completely away. Says states that he has been constipated even though he's been using MiraLAX daily. Patient states that he is afraid to have a stool  Due to the pain.   Patient did mention that he has lost about 40 pounds without trying over the past year. Also that he has a family history of at least 3 uncles that have died from colon cancer. Patient states that he did have bloody stools prior to the perirectal abscess ever coming on.   He has not ever had a colonoscopy.  Filed Vitals:   07/20/15 1453  BP: 104/66  Pulse: 76  Temp: 98 F (36.7 C)   PE: Gen: NAD Buttock:  Right buttock wound:  Penrose removed, some purulent drainage still but no induration or erythema.  A/P:  Perirectal abscess, instructed patient to start warm water bath soaks. Also instructed patient to start doing mag citrate for constipation until he starts having good bowel movements.   We will set him up with Dr. Servando Snare to have a colonoscopy in about 2 weeks.

## 2015-07-20 NOTE — Patient Instructions (Addendum)
Please do some sit baths with warm water.  You are okay to take a shower. Clean your incision with soap and water.  Go to your pharmacy and buy over-the-counter Magnesium Citrate to help you with your constipation. Remember to drink a lot of water.  Remember to go to your colonoscopy since you have family history of colon cancer.

## 2015-07-24 NOTE — Telephone Encounter (Signed)
Error

## 2015-07-31 ENCOUNTER — Encounter: Payer: Self-pay | Admitting: *Deleted

## 2015-08-03 ENCOUNTER — Telehealth: Payer: Self-pay | Admitting: Surgery

## 2015-08-03 NOTE — Discharge Instructions (Signed)

## 2015-08-03 NOTE — Telephone Encounter (Signed)
Patient came into the office to get a refill on his pain medication.

## 2015-08-04 ENCOUNTER — Ambulatory Visit
Admission: RE | Admit: 2015-08-04 | Discharge: 2015-08-04 | Disposition: A | Discharge status: Home / Self Care | Payer: Medicaid Other | Source: Ambulatory Visit | Attending: Gastroenterology | Admitting: Gastroenterology

## 2015-08-04 ENCOUNTER — Telehealth: Payer: Self-pay

## 2015-08-04 ENCOUNTER — Encounter
Admission: RE | Disposition: A | Discharge status: Home / Self Care | Payer: Self-pay | Source: Ambulatory Visit | Attending: Gastroenterology

## 2015-08-04 ENCOUNTER — Ambulatory Visit: Payer: Medicaid Other | Admitting: Anesthesiology

## 2015-08-04 DIAGNOSIS — Z1211 Encounter for screening for malignant neoplasm of colon: Secondary | ICD-10-CM | POA: Diagnosis not present

## 2015-08-04 DIAGNOSIS — Z818 Family history of other mental and behavioral disorders: Secondary | ICD-10-CM | POA: Insufficient documentation

## 2015-08-04 DIAGNOSIS — R51 Headache: Secondary | ICD-10-CM | POA: Insufficient documentation

## 2015-08-04 DIAGNOSIS — K640 First degree hemorrhoids: Secondary | ICD-10-CM | POA: Diagnosis not present

## 2015-08-04 DIAGNOSIS — K219 Gastro-esophageal reflux disease without esophagitis: Secondary | ICD-10-CM | POA: Insufficient documentation

## 2015-08-04 DIAGNOSIS — E039 Hypothyroidism, unspecified: Secondary | ICD-10-CM | POA: Diagnosis not present

## 2015-08-04 DIAGNOSIS — F259 Schizoaffective disorder, unspecified: Secondary | ICD-10-CM | POA: Insufficient documentation

## 2015-08-04 DIAGNOSIS — Z880 Allergy status to penicillin: Secondary | ICD-10-CM | POA: Diagnosis not present

## 2015-08-04 DIAGNOSIS — K5730 Diverticulosis of large intestine without perforation or abscess without bleeding: Secondary | ICD-10-CM | POA: Insufficient documentation

## 2015-08-04 DIAGNOSIS — Z833 Family history of diabetes mellitus: Secondary | ICD-10-CM | POA: Insufficient documentation

## 2015-08-04 DIAGNOSIS — Z79899 Other long term (current) drug therapy: Secondary | ICD-10-CM | POA: Diagnosis not present

## 2015-08-04 DIAGNOSIS — F17210 Nicotine dependence, cigarettes, uncomplicated: Secondary | ICD-10-CM | POA: Insufficient documentation

## 2015-08-04 HISTORY — DX: Gastro-esophageal reflux disease without esophagitis: K21.9

## 2015-08-04 HISTORY — DX: Headache, unspecified: R51.9

## 2015-08-04 HISTORY — DX: Headache: R51

## 2015-08-04 HISTORY — PX: COLONOSCOPY WITH PROPOFOL: SHX5780

## 2015-08-04 SURGERY — COLONOSCOPY WITH PROPOFOL
Anesthesia: Monitor Anesthesia Care | Wound class: Contaminated

## 2015-08-04 MED ORDER — STERILE WATER FOR IRRIGATION IR SOLN
Status: DC | PRN
  Administered 2015-08-04: 10:00:00

## 2015-08-04 MED ORDER — ONDANSETRON HCL 4 MG/2ML IJ SOLN: 4.0000 mg | Freq: Once | INTRAMUSCULAR | Status: DC | PRN

## 2015-08-04 MED ORDER — LIDOCAINE HCL (CARDIAC) 20 MG/ML IV SOLN
INTRAVENOUS | Status: DC | PRN
Start: 2015-08-04 — End: 2015-08-04
  Administered 2015-08-04: 50 mg via INTRAVENOUS

## 2015-08-04 MED ORDER — ACETAMINOPHEN 325 MG PO TABS: 325.0000 mg | ORAL_TABLET | ORAL | Status: DC | PRN

## 2015-08-04 MED ORDER — LACTATED RINGERS IV SOLN
INTRAVENOUS | Status: DC
  Administered 2015-08-04 (×2): via INTRAVENOUS

## 2015-08-04 MED ORDER — PROPOFOL 10 MG/ML IV BOLUS
INTRAVENOUS | Status: DC | PRN
  Administered 2015-08-04: 20 mg via INTRAVENOUS
  Administered 2015-08-04: 50 mg via INTRAVENOUS
  Administered 2015-08-04: 30 mg via INTRAVENOUS
  Administered 2015-08-04: 100 mg via INTRAVENOUS
  Administered 2015-08-04: 30 mg via INTRAVENOUS
  Administered 2015-08-04: 20 mg via INTRAVENOUS
  Administered 2015-08-04: 50 mg via INTRAVENOUS

## 2015-08-04 MED ORDER — ACETAMINOPHEN 160 MG/5ML PO SOLN: 325.0000 mg | ORAL | Status: DC | PRN

## 2015-08-04 SURGICAL SUPPLY — 28 items

## 2015-08-04 NOTE — Anesthesia Procedure Notes (Signed)
Procedure Name: MAC Performed by: Zay Yeargan Pre-anesthesia Checklist: Patient identified, Emergency Drugs available, Suction available, Patient being monitored and Timeout performed Patient Re-evaluated:Patient Re-evaluated prior to inductionOxygen Delivery Method: Nasal cannula       

## 2015-08-04 NOTE — Anesthesia Postprocedure Evaluation (Signed)
Anesthesia Post Note  Patient: Charles Charles  Procedure(s) Performed: Procedure(s) (LRB): COLONOSCOPY WITH PROPOFOL (N/A)  Patient location during evaluation: PACU Anesthesia Type: MAC Level of consciousness: awake and alert and oriented Pain management: pain level controlled Vital Signs Assessment: post-procedure vital signs reviewed and stable Respiratory status: spontaneous breathing and nonlabored ventilation Cardiovascular status: stable Postop Assessment: no signs of nausea or vomiting and adequate PO intake Anesthetic complications: no    Harolyn Rutherford

## 2015-08-04 NOTE — Telephone Encounter (Signed)
Patient called yesterday and I'm returning his call and wasn't able to leave a message since his voicemail is full.   Will try to call later on today.

## 2015-08-04 NOTE — H&P (Signed)
[facility]  [address], [address] [place], [place] [address] Phone: [phone] Fax : [phone]  Primary Care Physician:  No PCP Per Patient Primary Gastroenterologist:  Dr. Servando Snare  Pre-Procedure History & Physical: HPI:  Charles Charles is a 55 y.o. male is here for a screening colonoscopy.   Past Medical History  Diagnosis Date  . Schizo-affective schizophrenia (HCC)   . Headache   . GERD (gastroesophageal reflux disease)     Past Surgical History  Procedure Laterality Date  . Incision and drainage perirectal abscess N/A 07/14/2015    Procedure: IRRIGATION AND DEBRIDEMENT PERIRECTAL ABSCESS;  Surgeon: Lattie Haw, MD;  Location: [facility] ORS;  Service: General;  Laterality: N/A;    Prior to Admission medications   Medication Sig Start Date End Date Taking? Authorizing Provider  benztropine (COGENTIN) 0.5 MG tablet Take 0.5 mg by mouth daily as needed (for muscle stiffness).   Yes Historical Provider, MD  benztropine (COGENTIN) 1 MG tablet Take 1 mg by mouth at bedtime.   Yes Historical Provider, MD  haloperidol (HALDOL) 5 MG tablet Take 5 mg by mouth at bedtime.   Yes Historical Provider, MD  omeprazole (PRILOSEC) 20 MG capsule Take 20 mg by mouth daily as needed.   Yes Historical Provider, MD  oxyCODONE-acetaminophen (PERCOCET) 7.5-325 MG tablet Take 1 tablet by mouth every 4 (four) hours as needed for severe pain. 07/20/15  Yes Gladis Riffle, MD  PARoxetine (PAXIL) 20 MG tablet Take 20 mg by mouth daily.   Yes Historical Provider, MD  polyethylene glycol (MIRALAX) packet Take 17 g by mouth daily as needed for mild constipation or moderate constipation. 07/13/15  Yes Myrna Blazer, MD  risperiDONE (RISPERDAL) 1 MG tablet Take 1 mg by mouth at bedtime.   Yes Historical Provider, MD  traZODone (DESYREL) 50 MG tablet Take 50 mg by mouth at bedtime.   Yes Historical Provider, MD    Allergies as of 07/20/2015 - Review Complete 07/20/2015  Allergen  Reaction Noted  . Penicillins Anaphylaxis, Hives, and Other (See Comments) 12/23/2014    Family History  Problem Relation Age of Onset  . Anxiety disorder Mother   . Diabetes Mother   . Diabetes Father     Social History   Social History  . Marital Status: Single    Spouse Name: N/A  . Number of Children: N/A  . Years of Education: N/A   Occupational History  . Not on file.   Social History Main Topics  . Smoking status: Current Every Day Smoker -- 0.10 packs/day    Types: Cigarettes  . Smokeless tobacco: Never Used     Comment: (1-2 cigs/day)  . Alcohol Use: 33.6 oz/week    56 Cans of beer per week     Comment: 2 x 40's daily - pt says no alcohol for several weeks  . Drug Use: No     Comment: back in the days  . Sexual Activity: Not on file   Other Topics Concern  . Not on file   Social History Narrative    Review of Systems: See HPI, otherwise negative ROS  Physical Exam: BP 113/67 mmHg  Pulse 77  Temp(Src) 97.5 F (36.4 C)  Resp 16  Ht  (1.6 m)  Wt 143 lb (64.864 kg)  BMI 25.34 kg/m2  SpO2 100% General:   Alert,  pleasant and cooperative in NAD Head:  Normocephalic and atraumatic. Neck:  Supple; no masses or thyromegaly. Lungs:  Clear throughout  to auscultation.    Heart:  Regular rate and rhythm. Abdomen:  Soft, nontender and nondistended. Normal bowel sounds, without guarding, and without rebound.   Neurologic:  Alert and  oriented x4;  grossly normal neurologically.  Impression/Plan: Charles Charles is now here to undergo a screening colonoscopy.  Risks, benefits, and alternatives regarding colonoscopy have been reviewed with the patient.  Questions have been answered.  All parties agreeable.

## 2015-08-04 NOTE — Anesthesia Preprocedure Evaluation (Addendum)
Anesthesia Evaluation  Patient identified by MRN, date of birth, ID band Patient awake    Reviewed: Allergy & Precautions, NPO status , Patient's Chart, lab work & pertinent test results, reviewed documented beta blocker date and time   Airway Mallampati: I  TM Distance: >3 FB Neck ROM: Full    Dental  (+)    Pulmonary Current Smoker,    Pulmonary exam normal        Cardiovascular negative cardio ROS Normal cardiovascular exam     Neuro/Psych  Headaches, PSYCHIATRIC DISORDERS Schizophrenia    GI/Hepatic Neg liver ROS, GERD  Medicated and Controlled,  Endo/Other  Hypothyroidism   Renal/GU negative Renal ROS     Musculoskeletal negative musculoskeletal ROS (+)   Abdominal   Peds  Hematology negative hematology ROS (+)   Anesthesia Other Findings   Reproductive/Obstetrics                            Anesthesia Physical Anesthesia Plan  ASA: II  Anesthesia Plan: MAC   Post-op Pain Management:    Induction: Intravenous  Airway Management Planned:   Additional Equipment:   Intra-op Plan:   Post-operative Plan:   Informed Consent: I have reviewed the patients History and Physical, chart, labs and discussed the procedure including the risks, benefits and alternatives for the proposed anesthesia with the patient or authorized representative who has indicated his/her understanding and acceptance.     Plan Discussed with: CRNA  Anesthesia Plan Comments:         Anesthesia Quick Evaluation

## 2015-08-04 NOTE — Op Note (Signed)
[facility] Patient Name: Charles Charles Procedure Date: 08/04/2015 9:30 AM MRN: [id number] Account #: [id number] Date of Birth: 13-Dec-1960 Admit Type: Outpatient Age: 55 Room: [facility] OR ROOM 01 Gender: Male Note Status: Finalized Procedure:         Colonoscopy Indications:       Screening for colorectal malignant neoplasm Providers:         Midge Minium, MD Referring MD:      Adah Salvage. Excell Seltzer (Referring MD) Medicines:         Propofol per Anesthesia Complications:     No immediate complications. Procedure:         Pre-Anesthesia Assessment:                    - Prior to the procedure, a History and Physical was                     performed, and patient medications and allergies were                     reviewed. The patient's tolerance of previous anesthesia                     was also reviewed. The risks and benefits of the procedure                     and the sedation options and risks were discussed with the                     patient. All questions were answered, and informed consent                     was obtained. Prior Anticoagulants: The patient has taken                     no previous anticoagulant or antiplatelet agents. ASA                     Grade Assessment: II - A patient with mild systemic                     disease. After reviewing the risks and benefits, the                     patient was deemed in satisfactory condition to undergo                     the procedure.                    After obtaining informed consent, the colonoscope was                     passed under direct vision. Throughout the procedure, the                     patient's blood pressure, pulse, and oxygen saturations                     were monitored continuously. The Olympus CF-HQ190L                     Colonoscope (S#. [id number]) was introduced through the anus  and advanced to the the cecum, identified by appendiceal     orifice and ileocecal valve. The colonoscopy was performed                     without difficulty. The patient tolerated the procedure                     well. The quality of the bowel preparation was poor. Findings:      The perianal and digital rectal examinations were normal.      A moderate amount of liquid stool was found in the entire colon.      Non-bleeding internal hemorrhoids were found during retroflexion. The       hemorrhoids were Grade I (internal hemorrhoids that do not prolapse).      Multiple small-mouthed diverticula were found in the sigmoid colon. Impression:        - Preparation of the colon was poor.                    - Stool in the entire examined colon.                    - Non-bleeding internal hemorrhoids.                    - Diverticulosis in the sigmoid colon.                    - No specimens collected. Recommendation:    - Repeat colonoscopy in 5 years for screening purposes. Procedure Code(s): --- Professional ---                    [phone], Colonoscopy, flexible; diagnostic, including                     collection of specimen(s) by brushing or washing, when                     performed (separate procedure) Diagnosis Code(s): --- Professional ---                    Z12.11, Encounter for screening for malignant neoplasm of                     colon CPT copyright 2014 American Medical Association. All rights reserved. The codes documented in this report are preliminary and upon coder review may  be revised to meet current compliance requirements. Midge Minium, MD 08/04/2015 9:57:58 AM This report has been signed electronically. Number of Addenda: 0 Note Initiated On: 08/04/2015 9:30 AM Scope Withdrawal Time: 0 hours 6 minutes 44 seconds  Total Procedure Duration: 0 hours 12 minutes 33 seconds       [facility]

## 2015-08-04 NOTE — Telephone Encounter (Signed)
Error

## 2015-08-04 NOTE — Transfer of Care (Signed)
Immediate Anesthesia Transfer of Care Note  Patient: Charles Charles  Procedure(s) Performed: Procedure(s): COLONOSCOPY WITH PROPOFOL (N/A)  Patient Location: PACU  Anesthesia Type: MAC  Level of Consciousness: awake, alert  and patient cooperative  Airway and Oxygen Therapy: Patient Spontanous Breathing and Patient connected to supplemental oxygen  Post-op Assessment: Post-op Vital signs reviewed, Patient's Cardiovascular Status Stable, Respiratory Function Stable, Patent Airway and No signs of Nausea or vomiting  Post-op Vital Signs: Reviewed and stable  Complications: No apparent anesthesia complications

## 2015-08-07 ENCOUNTER — Encounter: Payer: Self-pay | Admitting: Gastroenterology

## 2015-08-07 NOTE — Telephone Encounter (Signed)
Called patient back and was not able to leave a voicemail. Hopefully patient will call us back.

## 2015-08-12 ENCOUNTER — Encounter (HOSPITAL_COMMUNITY): Payer: Self-pay | Admitting: Nurse Practitioner

## 2015-08-12 ENCOUNTER — Emergency Department (HOSPITAL_COMMUNITY)
Admission: EM | Admit: 2015-08-12 | Discharge: 2015-08-12 | Disposition: A | Discharge status: Home / Self Care | Payer: Medicaid Other | Attending: Emergency Medicine | Admitting: Emergency Medicine

## 2015-08-12 ENCOUNTER — Emergency Department (HOSPITAL_COMMUNITY): Payer: Medicaid Other

## 2015-08-12 DIAGNOSIS — F209 Schizophrenia, unspecified: Secondary | ICD-10-CM | POA: Diagnosis not present

## 2015-08-12 DIAGNOSIS — Z79899 Other long term (current) drug therapy: Secondary | ICD-10-CM | POA: Diagnosis not present

## 2015-08-12 DIAGNOSIS — F17210 Nicotine dependence, cigarettes, uncomplicated: Secondary | ICD-10-CM | POA: Diagnosis not present

## 2015-08-12 DIAGNOSIS — K219 Gastro-esophageal reflux disease without esophagitis: Secondary | ICD-10-CM | POA: Insufficient documentation

## 2015-08-12 DIAGNOSIS — Z880 Allergy status to penicillin: Secondary | ICD-10-CM | POA: Diagnosis not present

## 2015-08-12 DIAGNOSIS — K6289 Other specified diseases of anus and rectum: Secondary | ICD-10-CM | POA: Diagnosis present

## 2015-08-12 DIAGNOSIS — K611 Rectal abscess: Secondary | ICD-10-CM | POA: Diagnosis not present

## 2015-08-12 LAB — BASIC METABOLIC PANEL
ANION GAP: 11 (ref 5–15)
BUN: 11 mg/dL (ref 6–20)
CALCIUM: 9.8 mg/dL (ref 8.9–10.3)
CO2: 25 mmol/L (ref 22–32)
Chloride: 100 mmol/L — ABNORMAL LOW (ref 101–111)
Creatinine, Ser: 1.05 mg/dL (ref 0.61–1.24)
GFR calc Af Amer: >60 mL/min (ref >60)
GFR calc non Af Amer: >60 mL/min (ref >60)
GLUCOSE: 122 mg/dL — AB (ref 65–99)
POTASSIUM: 3.9 mmol/L (ref 3.5–5.1)
Sodium: 136 mmol/L (ref 135–145)

## 2015-08-12 LAB — CBC
HCT: 42.4 % (ref 39.0–52.0)
HEMOGLOBIN: 14.6 g/dL (ref 13.0–17.0)
MCH: 31.4 pg (ref 26.0–34.0)
MCHC: 34.4 g/dL (ref 30.0–36.0)
MCV: 91.2 fL (ref 78.0–100.0)
Platelets: 258 10*3/uL (ref 150–400)
RBC: 4.65 MIL/uL (ref 4.22–5.81)
RDW: 13.6 % (ref 11.5–15.5)
WBC: 9 10*3/uL (ref 4.0–10.5)

## 2015-08-12 MED ORDER — METRONIDAZOLE 500 MG PO TABS: 500.0000 mg | ORAL_TABLET | Freq: Two times a day (BID) | ORAL | Status: DC

## 2015-08-12 MED ORDER — MORPHINE SULFATE (PF) 2 MG/ML IV SOLN
2.0000 mg | Freq: Once | INTRAVENOUS | Status: AC
  Administered 2015-08-12: 2 mg via INTRAVENOUS
  Filled 2015-08-12: qty 1

## 2015-08-12 MED ORDER — IOHEXOL 300 MG/ML  SOLN
100.0000 mL | Freq: Once | INTRAMUSCULAR | Status: AC | PRN
  Administered 2015-08-12: 100 mL via INTRAVENOUS

## 2015-08-12 MED ORDER — HYDROCODONE-ACETAMINOPHEN 5-325 MG PO TABS: 1.0000 | ORAL_TABLET | ORAL | Status: DC | PRN

## 2015-08-12 MED ORDER — SODIUM CHLORIDE 0.9 % IV BOLUS (SEPSIS)
1000.0000 mL | Freq: Once | INTRAVENOUS | Status: AC
  Administered 2015-08-12: 1000 mL via INTRAVENOUS

## 2015-08-12 MED ORDER — CIPROFLOXACIN HCL 500 MG PO TABS: 500.0000 mg | ORAL_TABLET | Freq: Two times a day (BID) | ORAL | Status: DC

## 2015-08-12 MED ORDER — MORPHINE SULFATE (PF) 4 MG/ML IV SOLN
4.0000 mg | Freq: Once | INTRAVENOUS | Status: AC
  Administered 2015-08-12: 4 mg via INTRAVENOUS
  Filled 2015-08-12: qty 1

## 2015-08-12 NOTE — ED Notes (Signed)
Patient has hx of retroperitineal abscess that was drained previously. Patient had colonoscopy earlier this week and feels his abscess is back due to pain and swelling at site. Patient denies drainiage or bleeding, pt endorses worsening pain with BM but denies blood or difficulty with BM.

## 2015-08-12 NOTE — ED Provider Notes (Signed)
CSN: [id number]     Arrival date & time 08/12/15  7829 History   First MD Initiated Contact with Patient 08/12/15 [phone]     Chief Complaint  Patient presents with  . Rectal Pain   (Consider location/radiation/quality/duration/timing/severity/associated sxs/prior Treatment) HPI 55 y.o. male with a hx of perirectal abscess 07-14-15, presents to the Emergency Department today complaining of rectal pain since last week. Notes that he had a Colonoscopy with Dr. Servando Snare on 08-04-15, but felt rectal pain a few days afterwards. Feels a sharp pain in his rectum. 10/10. Relief with hot water baths, but only temporarily. Denies fevers, CP/SOB/ABD pain. Reports nausea and diaphoresis. No emesis. No blood noted in BMs. Having regular BMs, but painful. No dysuria. No other symptoms noted.     Past Medical History  Diagnosis Date  . Schizo-affective schizophrenia (HCC)   . Headache   . GERD (gastroesophageal reflux disease)    Past Surgical History  Procedure Laterality Date  . Incision and drainage perirectal abscess N/A 07/14/2015    Procedure: IRRIGATION AND DEBRIDEMENT PERIRECTAL ABSCESS;  Surgeon: Lattie Haw, MD;  Location: [facility] ORS;  Service: General;  Laterality: N/A;  . Colonoscopy with propofol N/A 08/04/2015    Procedure: COLONOSCOPY WITH PROPOFOL;  Surgeon: Midge Minium, MD;  Location: [facility];  Service: Endoscopy;  Laterality: N/A;   Family History  Problem Relation Age of Onset  . Anxiety disorder Mother   . Diabetes Mother   . Diabetes Father    Social History  Substance Use Topics  . Smoking status: Current Every Day Smoker -- 0.10 packs/day    Types: Cigarettes  . Smokeless tobacco: Never Used     Comment: (1-2 cigs/day)  . Alcohol Use: 33.6 oz/week    56 Cans of beer per week     Comment: 2 x 40's daily - pt says no alcohol for several weeks    Review of Systems ROS reviewed and all are negative for acute change except as noted in the HPI.  Allergies   Penicillins  Home Medications   Prior to Admission medications   Medication Sig Start Date End Date Taking? Authorizing Provider  benztropine (COGENTIN) 0.5 MG tablet Take 0.5 mg by mouth daily as needed (for muscle stiffness).   Yes Historical Provider, MD  benztropine (COGENTIN) 1 MG tablet Take 1 mg by mouth at bedtime.   Yes Historical Provider, MD  haloperidol (HALDOL) 5 MG tablet Take 5 mg by mouth at bedtime.   Yes Historical Provider, MD  omeprazole (PRILOSEC) 20 MG capsule Take 20 mg by mouth daily as needed.   Yes Historical Provider, MD  PARoxetine (PAXIL) 20 MG tablet Take 20 mg by mouth daily.   Yes Historical Provider, MD  polyethylene glycol (MIRALAX) packet Take 17 g by mouth daily as needed for mild constipation or moderate constipation. 07/13/15  Yes Myrna Blazer, MD  risperiDONE (RISPERDAL) 1 MG tablet Take 1 mg by mouth at bedtime.    Historical Provider, MD  traZODone (DESYREL) 50 MG tablet Take 50 mg by mouth at bedtime.    Historical Provider, MD   BP 115/73 mmHg  Pulse 97  Temp(Src) 97.5 F (36.4 C) (Oral)  Resp 16  Ht  (1.575 m)  Wt 64.864 kg  BMI 26.15 kg/m2  SpO2 100%   Physical Exam  Constitutional: He is oriented to person, place, and time. He appears well-developed and well-nourished.  HENT:  Head: Normocephalic and atraumatic.  Eyes: EOM are normal.  Neck: Normal range of motion. Neck supple.  Cardiovascular: Normal rate, regular rhythm and normal heart sounds.   Pulmonary/Chest: Effort normal and breath sounds normal.  Abdominal: Soft.  Genitourinary: Rectal exam shows tenderness. Rectal exam shows no external hemorrhoid and no internal hemorrhoid.  Chaperone was present during rectal examination. No masses palpated internally. Pt exhibited extreme discomfort with DRE  Musculoskeletal: Normal range of motion.  Neurological: He is alert and oriented to person, place, and time.  Skin: Skin is warm and dry.  Psychiatric: He has a  normal mood and affect. His behavior is normal. Thought content normal.  Nursing note and vitals reviewed.  ED Course  Procedures (including critical care time) Labs Review Labs Reviewed  BASIC METABOLIC PANEL - Abnormal; Notable for the following:    Chloride 100 (*)    Glucose, Bld 122 (*)    All other components within normal limits  CBC    Imaging Review Ct Abdomen Pelvis W Contrast  08/12/2015  CLINICAL DATA:  Rectal pain. EXAM: CT ABDOMEN AND PELVIS WITH CONTRAST TECHNIQUE: Multidetector CT imaging of the abdomen and pelvis was performed using the standard protocol following bolus administration of intravenous contrast. CONTRAST:  OMNIPAQUE IOHEXOL 300 MG/ML  SOLN COMPARISON:  CT dated 07/14/2015. FINDINGS: Lower chest:  No acute findings. Hepatobiliary: No masses or other significant abnormality. Pancreas: No mass, inflammatory changes, or other significant abnormality. Spleen: Within normal limits in size and appearance. Adrenals/Urinary Tract: Adrenal glands appear normal. Tiny hypodense lesion within the lateral cortex of the left kidney is too small to definitively characterize but most likely a small cyst. Kidneys otherwise unremarkable bilaterally without stone or hydronephrosis. No ureteral or bladder calculi identified. Bladder is unremarkable. Stomach/Bowel: The rim enhancing crescent-shaped perianal fluid collection is slightly decreased in extent compared to the earlier exam of 07/14/2015. Representative measurements as follows: Right lateral component of the fluid collection measures 5 mm diameter, previously 1.5 cm. The dominant central component of the collection measures 1.8 x 1.7 cm (previously 2.7 x 2.6 cm. Left lateral component measures 6 mm diameter, previously 1.4 cm. The thickening of the walls of the distal rectum is stable or perhaps slightly improved. The perirectal fluid stranding is stable. Underlying upper perineum is unremarkable. Large and small bowel are  normal in caliber. No other area of bowel wall thickening/inflammation. Appendix is normal P Vascular/Lymphatic: No pathologically enlarged lymph nodes. No evidence of abdominal aortic aneurysm. Reproductive: No mass or other significant abnormality. Other: No free intraperitoneal air. Musculoskeletal: No suspicious bone lesions identified. Minimal degenerative change within the lumbar spine. IMPRESSION: 1. Perianal abscess collection has decreased in size with representative measurements given above. The associated thickening of the walls of the distal rectum, with surrounding perirectal fluid stranding, is stable or also slightly improved. 2. Remainder of the abdomen and pelvis CT is unremarkable, as detailed above. Electronically Signed   By: Bary Richard M.D.   On: 08/12/2015 13:41   I have personally reviewed and evaluated these images and lab results as part of my medical decision-making.   EKG Interpretation None     MDM  I have reviewed relevant laboratory values. I have reviewed relevant imaging studies. I have reviewed the relevant previous healthcare records.I obtained HPI from historian. Patient discussed with supervising physician  ED Course: CT ABD/Pelvis  Assessment: 54y M with hx drained perirectal abscess on 07-14-15 by Dr. Dionne Milo. Complains of rectal pain since last week. Minimal relief with warm water baths. Pt states it feels  similar to abscess before. No blood noted. Regular BMs, but painful. On exam, no external hemorrhoids noted. No masses palpated on DRE, but extreme discomfort noted. Pt in NAD. VSS. Afebrile. CBC/BMP unremarkable. CT ABD/Pelvis showed perianal abscess with decrease in size from previous. Will DC with Cipro + Flagyl and follow up to General Surgeon for further management.        Disposition/Plan:  DC Home Additional Verbal discharge instructions given and discussed with patient.  Pt Instructed to f/u with PCP in the next 48 hours for evaluation and  treatment of symptoms. Return precautions given Pt acknowledges and agrees with plan  Supervising Physician Tilden Fossa, MD   Final diagnoses:  Perirectal abscess       Audry Pili, PA-C 08/12/15 1405  Tilden Fossa, MD 08/13/15 [phone]

## 2015-08-12 NOTE — Discharge Instructions (Signed)
Please read and follow all provided instructions.  Your diagnoses today include:  1. Perirectal abscess    Tests performed today include:  Vital signs. See below for your results today.   Medications prescribed:   Cipro + Flagyl   Norco  Take Both as Prescribed   Home care instructions:  Follow any educational materials contained in this packet.  Follow-up instructions: Please follow-up with your General Surgeon (see attached) for further management of symptoms   Return instructions:   Please return to the Emergency Department if you do not get better, if you get worse, or new symptoms OR  - Fever (temperature greater than 101.16F)  - Bleeding that does not stop with holding pressure to the area    -Severe pain (please note that you may be more sore the day after your accident)  - Chest Pain  - Difficulty breathing  - Severe nausea or vomiting  - Inability to tolerate food and liquids  - Passing out  - Skin becoming red around your wounds  - Change in mental status (confusion or lethargy)  - New numbness or weakness     Please return if you have any other emergent concerns.  Additional Information:  Your vital signs today were: BP 112/80 mmHg   Pulse 82   Temp(Src) 97.5 F (36.4 C) (Oral)   Resp 16   Ht  (1.575 m)   Wt 64.864 kg   BMI 26.15 kg/m2   SpO2 100% If your blood pressure (BP) was elevated above 135/85 this visit, please have this repeated by your doctor within one month. ---------------

## 2015-08-13 ENCOUNTER — Encounter: Payer: Self-pay | Admitting: Emergency Medicine

## 2015-08-13 ENCOUNTER — Emergency Department
Admission: EM | Admit: 2015-08-13 | Discharge: 2015-08-14 | Disposition: A | Discharge status: Home / Self Care | Payer: Medicaid Other | Source: Home / Self Care | Attending: Emergency Medicine | Admitting: Emergency Medicine

## 2015-08-13 DIAGNOSIS — Z792 Long term (current) use of antibiotics: Secondary | ICD-10-CM | POA: Insufficient documentation

## 2015-08-13 DIAGNOSIS — Z79899 Other long term (current) drug therapy: Secondary | ICD-10-CM | POA: Insufficient documentation

## 2015-08-13 DIAGNOSIS — K611 Rectal abscess: Secondary | ICD-10-CM

## 2015-08-13 DIAGNOSIS — F17210 Nicotine dependence, cigarettes, uncomplicated: Secondary | ICD-10-CM

## 2015-08-13 DIAGNOSIS — Z880 Allergy status to penicillin: Secondary | ICD-10-CM

## 2015-08-13 NOTE — ED Notes (Signed)
Had surgery on 07/20/15 by dr cooper for perirectal abscess - went to [place] yesterday and was told to follow up with dr cooper on Monday. Came today because states can't wait until tomorrow

## 2015-08-13 NOTE — ED Notes (Signed)
Took the last of his pain med at 530pm and also took his antibiotics (cipro and flagyl)

## 2015-08-14 ENCOUNTER — Ambulatory Visit (INDEPENDENT_AMBULATORY_CARE_PROVIDER_SITE_OTHER): Payer: Medicaid Other | Admitting: Surgery

## 2015-08-14 ENCOUNTER — Encounter: Payer: Self-pay | Admitting: Surgery

## 2015-08-14 ENCOUNTER — Inpatient Hospital Stay: Payer: Medicaid Other | Admitting: Anesthesiology

## 2015-08-14 ENCOUNTER — Encounter
Admission: AD | Disposition: A | Discharge status: Home / Self Care | Payer: Self-pay | Source: Ambulatory Visit | Attending: Surgery

## 2015-08-14 ENCOUNTER — Encounter: Payer: Self-pay | Admitting: Anesthesiology

## 2015-08-14 ENCOUNTER — Inpatient Hospital Stay
Admission: AD | Admit: 2015-08-14 | Discharge: 2015-08-17 | DRG: 331 | Disposition: A | Discharge status: Home / Self Care | Payer: Medicaid Other | Source: Ambulatory Visit | Attending: Surgery | Admitting: Surgery

## 2015-08-14 VITALS — BP 110/72 | Wt 146.0 lb

## 2015-08-14 DIAGNOSIS — K611 Rectal abscess: Secondary | ICD-10-CM | POA: Diagnosis present

## 2015-08-14 DIAGNOSIS — K219 Gastro-esophageal reflux disease without esophagitis: Secondary | ICD-10-CM | POA: Diagnosis present

## 2015-08-14 DIAGNOSIS — Z818 Family history of other mental and behavioral disorders: Secondary | ICD-10-CM | POA: Diagnosis not present

## 2015-08-14 DIAGNOSIS — Z833 Family history of diabetes mellitus: Secondary | ICD-10-CM | POA: Diagnosis not present

## 2015-08-14 DIAGNOSIS — Z880 Allergy status to penicillin: Secondary | ICD-10-CM | POA: Diagnosis not present

## 2015-08-14 DIAGNOSIS — Z9889 Other specified postprocedural states: Secondary | ICD-10-CM

## 2015-08-14 DIAGNOSIS — F17210 Nicotine dependence, cigarettes, uncomplicated: Secondary | ICD-10-CM | POA: Diagnosis present

## 2015-08-14 DIAGNOSIS — K612 Anorectal abscess: Secondary | ICD-10-CM | POA: Diagnosis not present

## 2015-08-14 HISTORY — PX: INCISION AND DRAINAGE PERIRECTAL ABSCESS: SHX1804

## 2015-08-14 LAB — CBC
HEMATOCRIT: 39.8 % — AB (ref 40.0–52.0)
HEMOGLOBIN: 13.8 g/dL (ref 13.0–18.0)
MCH: 31.3 pg (ref 26.0–34.0)
MCHC: 34.8 g/dL (ref 32.0–36.0)
MCV: 90.1 fL (ref 80.0–100.0)
Platelets: 252 10*3/uL (ref 150–440)
RBC: 4.42 MIL/uL (ref 4.40–5.90)
RDW: 13.8 % (ref 11.5–14.5)
WBC: 11.7 10*3/uL — AB (ref 3.8–10.6)

## 2015-08-14 LAB — SURGICAL PCR SCREEN
MRSA, PCR: NEGATIVE
STAPHYLOCOCCUS AUREUS: NEGATIVE

## 2015-08-14 LAB — BASIC METABOLIC PANEL
ANION GAP: 9 (ref 5–15)
BUN: 13 mg/dL (ref 6–20)
CHLORIDE: 98 mmol/L — AB (ref 101–111)
CO2: 27 mmol/L (ref 22–32)
Calcium: 9.1 mg/dL (ref 8.9–10.3)
Creatinine, Ser: 0.95 mg/dL (ref 0.61–1.24)
Glucose, Bld: 101 mg/dL — ABNORMAL HIGH (ref 65–99)
POTASSIUM: 3.7 mmol/L (ref 3.5–5.1)
SODIUM: 134 mmol/L — AB (ref 135–145)

## 2015-08-14 SURGERY — INCISION AND DRAINAGE, ABSCESS, PERIRECTAL
Anesthesia: General

## 2015-08-14 MED ORDER — CIPROFLOXACIN IN D5W 400 MG/200ML IV SOLN: 400.0000 mg | Freq: Two times a day (BID) | INTRAVENOUS | Status: DC

## 2015-08-14 MED ORDER — OXYCODONE-ACETAMINOPHEN 5-325 MG PO TABS: 1.0000 | ORAL_TABLET | ORAL | Status: DC | PRN

## 2015-08-14 MED ORDER — ONDANSETRON 8 MG PO TBDP: 4.0000 mg | ORAL_TABLET | Freq: Four times a day (QID) | ORAL | Status: DC | PRN

## 2015-08-14 MED ORDER — OXYCODONE HCL 5 MG PO TABS
5.0000 mg | ORAL_TABLET | ORAL | Status: DC | PRN
  Administered 2015-08-16 – 2015-08-17 (×2): 10 mg via ORAL
  Filled 2015-08-14 (×2): qty 2

## 2015-08-14 MED ORDER — HYDROMORPHONE HCL 1 MG/ML IJ SOLN
INTRAMUSCULAR | Status: AC
  Administered 2015-08-14: 23:00:00
  Filled 2015-08-14: qty 1

## 2015-08-14 MED ORDER — HALOPERIDOL 5 MG PO TABS
5.0000 mg | ORAL_TABLET | Freq: Every day | ORAL | Status: DC
  Administered 2015-08-15 – 2015-08-16 (×2): 5 mg via ORAL
  Filled 2015-08-14 (×2): qty 1

## 2015-08-14 MED ORDER — OXYCODONE-ACETAMINOPHEN 5-325 MG PO TABS
1.0000 | ORAL_TABLET | Freq: Once | ORAL | Status: AC
  Administered 2015-08-14: 1 via ORAL
  Filled 2015-08-14: qty 1

## 2015-08-14 MED ORDER — METRONIDAZOLE IN NACL 5-0.79 MG/ML-% IV SOLN
500.0000 mg | Freq: Three times a day (TID) | INTRAVENOUS | Status: DC
  Administered 2015-08-14 – 2015-08-17 (×8): 500 mg via INTRAVENOUS
  Filled 2015-08-14 (×13): qty 100

## 2015-08-14 MED ORDER — MORPHINE SULFATE (PF) 4 MG/ML IV SOLN
4.0000 mg | INTRAVENOUS | Status: DC | PRN
  Administered 2015-08-14 – 2015-08-15 (×2): 4 mg via INTRAVENOUS
  Filled 2015-08-14 (×2): qty 1

## 2015-08-14 MED ORDER — BENZTROPINE MESYLATE 1 MG PO TABS
1.0000 mg | ORAL_TABLET | Freq: Every day | ORAL | Status: DC
  Administered 2015-08-15 – 2015-08-16 (×2): 1 mg via ORAL
  Filled 2015-08-14 (×4): qty 1

## 2015-08-14 MED ORDER — ONDANSETRON HCL 4 MG/2ML IJ SOLN: 4.0000 mg | Freq: Four times a day (QID) | INTRAMUSCULAR | Status: DC | PRN

## 2015-08-14 MED ORDER — FENTANYL CITRATE (PF) 100 MCG/2ML IJ SOLN
INTRAMUSCULAR | Status: AC
  Filled 2015-08-14: qty 4

## 2015-08-14 MED ORDER — CIPROFLOXACIN IN D5W 400 MG/200ML IV SOLN
400.0000 mg | Freq: Two times a day (BID) | INTRAVENOUS | Status: DC
  Administered 2015-08-14 – 2015-08-17 (×6): 400 mg via INTRAVENOUS
  Filled 2015-08-14 (×8): qty 200

## 2015-08-14 MED ORDER — FENTANYL CITRATE (PF) 100 MCG/2ML IJ SOLN
INTRAMUSCULAR | Status: DC | PRN
  Administered 2015-08-14: 100 ug via INTRAVENOUS

## 2015-08-14 MED ORDER — HEPARIN SODIUM (PORCINE) 5000 UNIT/ML IJ SOLN
5000.0000 [IU] | Freq: Three times a day (TID) | INTRAMUSCULAR | Status: DC
  Filled 2015-08-14: qty 1

## 2015-08-14 MED ORDER — DEXTROSE IN LACTATED RINGERS 5 % IV SOLN
INTRAVENOUS | Status: DC
  Administered 2015-08-14 – 2015-08-16 (×5): via INTRAVENOUS

## 2015-08-14 MED ORDER — KETOROLAC TROMETHAMINE 30 MG/ML IJ SOLN
30.0000 mg | Freq: Four times a day (QID) | INTRAMUSCULAR | Status: DC
  Administered 2015-08-15 – 2015-08-17 (×11): 30 mg via INTRAVENOUS
  Filled 2015-08-14 (×11): qty 1

## 2015-08-14 MED ORDER — FENTANYL CITRATE (PF) 100 MCG/2ML IJ SOLN
25.0000 ug | INTRAMUSCULAR | Status: AC | PRN
  Administered 2015-08-14 (×2): 25 ug via INTRAVENOUS

## 2015-08-14 MED ORDER — ONDANSETRON HCL 4 MG/2ML IJ SOLN: 4.0000 mg | Freq: Once | INTRAMUSCULAR | Status: DC | PRN

## 2015-08-14 MED ORDER — PROPOFOL 10 MG/ML IV BOLUS
INTRAVENOUS | Status: DC | PRN
  Administered 2015-08-14: 150 mg via INTRAVENOUS

## 2015-08-14 MED ORDER — PAROXETINE HCL 20 MG PO TABS
20.0000 mg | ORAL_TABLET | Freq: Every day | ORAL | Status: DC
  Administered 2015-08-15 – 2015-08-17 (×3): 20 mg via ORAL
  Filled 2015-08-14 (×3): qty 1

## 2015-08-14 MED ORDER — CYCLOBENZAPRINE HCL 10 MG PO TABS
10.0000 mg | ORAL_TABLET | Freq: Three times a day (TID) | ORAL | Status: DC
  Administered 2015-08-15 – 2015-08-17 (×8): 10 mg via ORAL
  Filled 2015-08-14 (×8): qty 1

## 2015-08-14 MED ORDER — MIDAZOLAM HCL 2 MG/2ML IJ SOLN
INTRAMUSCULAR | Status: DC | PRN
Start: 2015-08-14 — End: 2015-08-14
  Administered 2015-08-14: 2 mg via INTRAVENOUS

## 2015-08-14 MED ORDER — DIAZEPAM 2 MG PO TABS
2.0000 mg | ORAL_TABLET | Freq: Once | ORAL | Status: AC
  Administered 2015-08-14: 2 mg via ORAL
  Filled 2015-08-14: qty 1

## 2015-08-14 MED ORDER — METRONIDAZOLE IN NACL 5-0.79 MG/ML-% IV SOLN: 500.0000 mg | Freq: Three times a day (TID) | INTRAVENOUS | Status: DC

## 2015-08-14 MED ORDER — TRAZODONE HCL 50 MG PO TABS
50.0000 mg | ORAL_TABLET | Freq: Every day | ORAL | Status: DC
  Administered 2015-08-15 – 2015-08-16 (×2): 50 mg via ORAL
  Filled 2015-08-14 (×2): qty 1

## 2015-08-14 MED ORDER — ENOXAPARIN SODIUM 40 MG/0.4ML ~~LOC~~ SOLN
40.0000 mg | SUBCUTANEOUS | Status: DC
Start: 2015-08-15 — End: 2015-08-17
  Filled 2015-08-14 (×2): qty 0.4

## 2015-08-14 MED ORDER — DIAZEPAM 2 MG PO TABS: 2.0000 mg | ORAL_TABLET | Freq: Three times a day (TID) | ORAL | Status: DC | PRN

## 2015-08-14 MED ORDER — HYDROMORPHONE HCL 1 MG/ML IJ SOLN
0.5000 mg | INTRAMUSCULAR | Status: DC | PRN
  Administered 2015-08-14 (×2): 0.5 mg via INTRAVENOUS

## 2015-08-14 MED ORDER — ONDANSETRON HCL 4 MG PO TABS: 4.0000 mg | ORAL_TABLET | Freq: Four times a day (QID) | ORAL | Status: DC | PRN

## 2015-08-14 MED ORDER — FENTANYL CITRATE (PF) 100 MCG/2ML IJ SOLN
25.0000 ug | INTRAMUSCULAR | Status: AC | PRN
  Administered 2015-08-14 (×6): 25 ug via INTRAVENOUS

## 2015-08-14 MED ORDER — SUCCINYLCHOLINE CHLORIDE 20 MG/ML IJ SOLN
INTRAMUSCULAR | Status: DC | PRN
  Administered 2015-08-14: 100 mg via INTRAVENOUS

## 2015-08-14 MED ORDER — DEXTROSE IN LACTATED RINGERS 5 % IV SOLN: INTRAVENOUS | Status: DC

## 2015-08-14 MED ORDER — LACTATED RINGERS IV SOLN
INTRAVENOUS | Status: DC | PRN
  Administered 2015-08-14: 21:00:00 via INTRAVENOUS

## 2015-08-14 MED ORDER — BENZTROPINE MESYLATE 0.5 MG PO TABS
0.5000 mg | ORAL_TABLET | Freq: Every day | ORAL | Status: DC | PRN
  Filled 2015-08-14: qty 1

## 2015-08-14 MED ORDER — LIDOCAINE HCL 2 % EX GEL
1.0000 "application " | Freq: Once | CUTANEOUS | Status: AC
  Administered 2015-08-14: 1 via TOPICAL
  Filled 2015-08-14: qty 5

## 2015-08-14 MED ORDER — MORPHINE SULFATE (PF) 2 MG/ML IV SOLN: 2.0000 mg | INTRAVENOUS | Status: DC | PRN

## 2015-08-14 SURGICAL SUPPLY — 30 items
BINDER ABDOMINAL 12 ML 46-62 (SOFTGOODS) ×2 IMPLANT
BLADE CLIPPER SURG (BLADE) ×1 IMPLANT
BNDG GAUZE 4.5X4.1 6PLY STRL (MISCELLANEOUS) IMPLANT
BRIEF STRETCH MATERNITY 2XLG (MISCELLANEOUS) ×3 IMPLANT
CANISTER SUCT 1200ML W/VALVE (MISCELLANEOUS) ×3 IMPLANT
CHLORAPREP W/TINT 26ML (MISCELLANEOUS) ×1 IMPLANT
DRAIN PENROSE 1/4X12 LTX (DRAIN) ×2 IMPLANT
DRAPE LAPAROTOMY 100X77 ABD (DRAPES) ×3 IMPLANT
DRAPE UTILITY 15X26 TOWEL STRL (DRAPES) ×6 IMPLANT
ELECT REM PT RETURN 9FT ADLT (ELECTROSURGICAL)
ELECTRODE REM PT RTRN 9FT ADLT (ELECTROSURGICAL) ×1 IMPLANT
GOWN STRL REUS W/ TWL LRG LVL3 (GOWN DISPOSABLE) ×1 IMPLANT
GOWN STRL REUS W/ TWL XL LVL3 (GOWN DISPOSABLE) ×1 IMPLANT
GOWN STRL REUS W/TWL LRG LVL3 (GOWN DISPOSABLE) ×3
GOWN STRL REUS W/TWL XL LVL3 (GOWN DISPOSABLE) ×3
KIT RM TURNOVER STRD PROC AR (KITS) ×3 IMPLANT
LABEL OR SOLS (LABEL) ×1 IMPLANT
NDL HYPO 25X1 1.5 SAFETY (NEEDLE) ×1 IMPLANT
NEEDLE HYPO 25X1 1.5 SAFETY (NEEDLE) IMPLANT
NS IRRIG 500ML POUR BTL (IV SOLUTION) ×3 IMPLANT
PACK BASIN MINOR ARMC (MISCELLANEOUS) ×3 IMPLANT
PAD ABD DERMACEA PRESS 5X9 (GAUZE/BANDAGES/DRESSINGS) ×4 IMPLANT
SUT CHROMIC 2 0 CT 1 (SUTURE) ×1 IMPLANT
SUT CHROMIC 3 0 SH 27 (SUTURE) ×1 IMPLANT
SUT ETHILON 3 0 FSLX (SUTURE) ×2 IMPLANT
SUT ETHILON 3-0 FS-10 30 BLK (SUTURE)
SUTURE EHLN 3-0 FS-10 30 BLK (SUTURE) ×1 IMPLANT
SWABSTK COMLB BENZOIN TINCTURE (MISCELLANEOUS) ×2 IMPLANT
SYRINGE 10CC LL (SYRINGE) ×1 IMPLANT
TAPE ADH 3 LX (MISCELLANEOUS) ×1 IMPLANT

## 2015-08-14 NOTE — H&P (Signed)
Chief Complaint  Patient presents with  . Follow-up    Abscess    HPI: [name] is following up after I and D of perirectal abscess (horseshoe) by Dr. Excell Seltzer 4 weeks ago. He had significant improvement of symptoms for the first 2 weeks after the procedure but recently over the last week has had increase in perirectal pain. Now the pain is severe is dull and is constant. He has some chills. He has been to multiple emergency room was including months regional early this morning and apparently at that time he refused a rectal examination. He also has been to [facility] ER. A CT scan of the pelvis was performed 2 days ago showing some improvement of the abscesses but with residual collection in the perirectal area.    Past Medical History  Diagnosis Date  . Schizo-affective schizophrenia (HCC)   . Headache   . GERD (gastroesophageal reflux disease)     Past Surgical History  Procedure Laterality Date  . Incision and drainage perirectal abscess N/A 07/14/2015    Procedure: IRRIGATION AND DEBRIDEMENT PERIRECTAL ABSCESS; Surgeon: Lattie Haw, MD; Location: [facility] ORS; Service: General; Laterality: N/A;  . Colonoscopy with propofol N/A 08/04/2015    Procedure: COLONOSCOPY WITH PROPOFOL; Surgeon: Midge Minium, MD; Location: [facility]; Service: Endoscopy; Laterality: N/A;    Family History  Problem Relation Age of Onset  . Anxiety disorder Mother   . Diabetes Mother   . Diabetes Father     Social History:  reports that he has been smoking Cigarettes. He has been smoking about 0.10 packs per day. He has never used smokeless tobacco. He reports that he drinks about 33.6 oz of alcohol per week. He reports that he does not use illicit drugs.  Allergies:  Allergies  Allergen Reactions  . Penicillins Anaphylaxis, Hives and Other (See Comments)    Has patient had a PCN reaction causing immediate rash,  facial/tongue/throat swelling, SOB or lightheadedness with hypotension: Yes Has patient had a PCN reaction causing severe rash involving mucus membranes or skin necrosis: No Has patient had a PCN reaction that required hospitalization No Has patient had a PCN reaction occurring within the last 10 years: Yes If all of the above answers are "NO", then may proceed with Cephalosporin use.    Medications reviewed.     ROS is otherwise negative other than what is stated in the history of present illness    BP 110/72 mmHg  Wt 66.225 kg (146 lb)  Physical Exam 55 year old no acute distress Cardiac: S1 and S2 no murmurs or rubs. Chest: Lungs are clear to auscultation bilaterally  Abdomen: Soft nontender no masses no peritonitis Rectal: Exquisite tenderness to palpation in the perirectal area tracking to the left gluteus and obviously exam is limited due to the severity of the pain but there is fluctuance and there is erythema and induration. Extremity: No edema well perfused Neurological: Awake alert no motor deficits he is competent     Lab Results Last 48 Hours    No results found for this or any previous visit (from the past 48 hour(s)).    Imaging Results (Last 48 hours)    Ct Abdomen Pelvis W Contrast  08/12/2015 CLINICAL DATA: Rectal pain. EXAM: CT ABDOMEN AND PELVIS WITH CONTRAST TECHNIQUE: Multidetector CT imaging of the abdomen and pelvis was performed using the standard protocol following bolus administration of intravenous contrast. CONTRAST: OMNIPAQUE IOHEXOL 300 MG/ML SOLN COMPARISON: CT dated 07/14/2015. FINDINGS: Lower chest: No acute findings. Hepatobiliary:  No masses or other significant abnormality. Pancreas: No mass, inflammatory changes, or other significant abnormality. Spleen: Within normal limits in size and appearance. Adrenals/Urinary Tract: Adrenal glands appear normal. Tiny hypodense lesion within the lateral cortex of the left kidney is too small to  definitively characterize but most likely a small cyst. Kidneys otherwise unremarkable bilaterally without stone or hydronephrosis. No ureteral or bladder calculi identified. Bladder is unremarkable. Stomach/Bowel: The rim enhancing crescent-shaped perianal fluid collection is slightly decreased in extent compared to the earlier exam of 07/14/2015. Representative measurements as follows: Right lateral component of the fluid collection measures 5 mm diameter, previously 1.5 cm. The dominant central component of the collection measures 1.8 x 1.7 cm (previously 2.7 x 2.6 cm. Left lateral component measures 6 mm diameter, previously 1.4 cm. The thickening of the walls of the distal rectum is stable or perhaps slightly improved. The perirectal fluid stranding is stable. Underlying upper perineum is unremarkable. Large and small bowel are normal in caliber. No other area of bowel wall thickening/inflammation. Appendix is normal P Vascular/Lymphatic: No pathologically enlarged lymph nodes. No evidence of abdominal aortic aneurysm. Reproductive: No mass or other significant abnormality. Other: No free intraperitoneal air. Musculoskeletal: No suspicious bone lesions identified. Minimal degenerative change within the lumbar spine. IMPRESSION: 1. Perianal abscess collection has decreased in size with representative measurements given above. The associated thickening of the walls of the distal rectum, with surrounding perirectal fluid stranding, is stable or also slightly improved. 2. Remainder of the abdomen and pelvis CT is unremarkable, as detailed above. Electronically Signed By: Bary Richard M.D. 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 We'll go ahead and recommend admitting to [facility] regional hospital start him on ciprofloxacin and Flagyl since he is allergic to penicillin. Start IV fluids keep him nothing by mouth  and schedule him as the OR allows for an I&D today or tomorrow morning. Discussed in detail with my partner Dr. Excell Seltzer. Lengthy discussion with the patient about the procedure, the risks benefits and possible complications. He understands and wishes to proceed  Sterling Big, MD [facility] General Surgeon

## 2015-08-14 NOTE — ED Provider Notes (Signed)
[facility] Emergency Department Provider Note  ____________________________________________  Time seen: Approximately 1:54 AM  I have reviewed the triage vital signs and the nursing notes.   HISTORY  Chief Complaint Abscess    HPI Charles Charles is a 55 y.o. male who presents to the ED from home with a chief complaint of rectal pain. Patient had perirectal abscess drained by Dr. Excell Seltzer on 07/14/2015. Had a colonoscopy on 08/04/2015 and has had increased rectal pain since. He was seen at [facility] 1-1/2 days ago with CT scan which indicateddecreased size of perianal abscess from prior. He was discharged with Cipro and Flagyl and 10 tablets of Valium which he has finished. Patient also states he has run out of his Percocet pain medications and presents to the ED this morning for pain control. Patient denies fever, chills, chest pain, shortness of breath, abdominal pain, nausea, vomiting, diarrhea. States he is having regular bowel movements and taking daily stool softeners. Nothing makes this pain better or worse.   Past Medical History  Diagnosis Date  . Schizo-affective schizophrenia (HCC)   . Headache   . GERD (gastroesophageal reflux disease)     Patient Active Problem List   Diagnosis Date Noted  . Special screening for malignant neoplasms, colon   . Perirectal abscess 07/14/2015  . Abscess, perirectal   . Stimulant abuse 12/14/2014  . Cocaine abuse 12/17/2013  . Adult hypothyroidism 12/17/2013  . 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 Blurred vision 12/17/2013    Past Surgical History  Procedure Laterality Date  . Incision and drainage perirectal abscess N/A 07/14/2015    Procedure: IRRIGATION AND DEBRIDEMENT PERIRECTAL ABSCESS;  Surgeon: Lattie Haw, MD;  Location: [facility] ORS;  Service: General;  Laterality: N/A;  . Colonoscopy with propofol N/A 08/04/2015    Procedure: COLONOSCOPY WITH PROPOFOL;  Surgeon: Midge Minium, MD;  Location: [facility];  Service: Endoscopy;  Laterality: N/A;    Current Outpatient Rx  Name  Route  Sig  Dispense  Refill  . benztropine (COGENTIN) 0.5 MG tablet   Oral   Take 0.5 mg by mouth daily as needed (for muscle stiffness).         . benztropine (COGENTIN) 1 MG tablet   Oral   Take 1 mg by mouth at bedtime.         . ciprofloxacin (CIPRO) 500 MG tablet   Oral   Take 1 tablet (500 mg total) by mouth 2 (two) times daily.   14 tablet   0   . haloperidol (HALDOL) 5 MG tablet   Oral   Take 5 mg by mouth at bedtime.         Marland Kitchen HYDROcodone-acetaminophen (NORCO/VICODIN) 5-325 MG tablet   Oral   Take 1 tablet by mouth every 4 (four) hours as needed.   10 tablet   0   . metroNIDAZOLE (FLAGYL) 500 MG tablet   Oral   Take 1 tablet (500 mg total) by mouth 2 (two) times daily.   14 tablet   0   . omeprazole (PRILOSEC) 20 MG capsule   Oral   Take 20 mg by mouth daily as needed.         Marland Kitchen PARoxetine (PAXIL) 20 MG tablet   Oral   Take 20 mg by mouth daily.         . polyethylene glycol (MIRALAX) packet   Oral   Take 17 g by mouth daily as needed for mild constipation or moderate  constipation.   14 each   0   . risperiDONE (RISPERDAL) 1 MG tablet   Oral   Take 1 mg by mouth at bedtime.         . traZODone (DESYREL) 50 MG tablet   Oral   Take 50 mg by mouth at bedtime.           Allergies Penicillins  Family History  Problem Relation Age of Onset  . Anxiety disorder Mother   . Diabetes Mother   . Diabetes Father     Social History Social History  Substance Use Topics  . Smoking status: Current Every Day Smoker -- 0.10 packs/day    Types: Cigarettes  . Smokeless tobacco: Never Used     Comment: (1-2 cigs/day)  . Alcohol Use: 33.6 oz/week    56 Cans of beer per week     Comment: 2 x 40's daily - pt says no alcohol for several weeks    Review of Systems Constitutional: No fever/chills Eyes: No visual changes. ENT: No sore  throat. Cardiovascular: Denies chest pain. Respiratory: Denies shortness of breath. Gastrointestinal: Positive for rectal pain. No abdominal pain.  No nausea, no vomiting.  No diarrhea.  No constipation. Genitourinary: Negative for dysuria. Musculoskeletal: Negative for back pain. Skin: Negative for rash. Neurological: Negative for headaches, focal weakness or numbness.  10-point ROS otherwise negative.  ____________________________________________   PHYSICAL EXAM:  VITAL SIGNS: ED Triage Vitals  Enc Vitals Group     BP 08/13/15 2006 109/70 mmHg     Pulse Rate 08/13/15 2006 98     Resp 08/13/15 2006 16     Temp 08/13/15 2006 98 F (36.7 C)     Temp Source 08/13/15 2006 Oral     SpO2 08/13/15 2006 96 %     Weight 08/13/15 2006 140 lb (63.504 kg)     Height 08/13/15 2006 5\' 2"  (1.575 m)     Head Cir --      Peak Flow --      Pain Score 08/13/15 2006 10     Pain Loc --      Pain Edu? --      Excl. in GC? --     Constitutional: Alert and oriented. Well appearing and in no acute distress. Eyes: Conjunctivae are normal. PERRL. EOMI. Head: Atraumatic. Nose: No congestion/rhinnorhea. Mouth/Throat: Mucous membranes are moist.  Oropharynx non-erythematous. Neck: No stridor.   Cardiovascular: Normal rate, regular rhythm. Grossly normal heart sounds.  Good peripheral circulation. Respiratory: Normal respiratory effort.  No retractions. Lungs CTAB. Gastrointestinal: Soft and nontender. No distention. No abdominal bruits. No CVA tenderness. Musculoskeletal: No lower extremity tenderness nor edema.  No joint effusions. Neurologic:  Normal speech and language. No gross focal neurologic deficits are appreciated. No gait instability. Skin:  Skin is warm, dry and intact. No rash noted. Psychiatric: Mood and affect are normal. Speech and behavior are normal.  ____________________________________________   LABS (all labs ordered are listed, but only abnormal results are  displayed)  Labs Reviewed - No data to display ____________________________________________  EKG  None ____________________________________________  RADIOLOGY  None ____________________________________________   PROCEDURES  Procedure(s) performed:   Rectal exam: Patient refused digital rectal exam. Consented to external exam. On direct visualization there is no external hemorrhoid or anal fissure. Topical lidocaine applied to rectum for comfort.  Critical Care performed: No  ____________________________________________   INITIAL IMPRESSION / ASSESSMENT AND PLAN / ED COURSE  Pertinent labs & imaging results that were available during  my care of the patient were reviewed by me and considered in my medical decision making (see chart for details).  55 year old male who presents with rectal pain secondary to perianal abscess. Has an appointment with his surgeon this morning. Is currently taking antibiotics. Will add pain control with Percocet and Valium, encouraged to continue use of daily stool softeners and sitz baths and strongly encouraged patient to keep his appointment with his surgeon this morning. Strict return precautions given. Patient verbalizes understanding and agrees with plan of care. ____________________________________________   FINAL CLINICAL IMPRESSION(S) / ED DIAGNOSES  Final diagnoses:  Perirectal abscess      Irean Hong, MD 08/14/15 [phone]

## 2015-08-14 NOTE — Anesthesia Preprocedure Evaluation (Addendum)
Anesthesia Evaluation  Patient identified by MRN, date of birth, ID band Patient awake    Reviewed: Allergy & Precautions, NPO status , Patient's Chart, lab work & pertinent test results  Airway Mallampati: II  TM Distance: >3 FB     Dental  (+) Chipped   Pulmonary Current Smoker,    Pulmonary exam normal        Cardiovascular negative cardio ROS Normal cardiovascular exam     Neuro/Psych  Headaches, PSYCHIATRIC DISORDERS Schizophrenia    GI/Hepatic GERD  Medicated and Controlled,(+)     substance abuse  cocaine use, Cocaine use in the past, but emergent case   Endo/Other  Hypothyroidism   Renal/GU negative Renal ROS  negative genitourinary   Musculoskeletal negative musculoskeletal ROS (+)   Abdominal Normal abdominal exam  (+)   Peds negative pediatric ROS (+)  Hematology negative hematology ROS (+)   Anesthesia Other Findings   Reproductive/Obstetrics                           Anesthesia Physical Anesthesia Plan  ASA: II and emergent  Anesthesia Plan: General   Post-op Pain Management:    Induction: Intravenous, Rapid sequence and Cricoid pressure planned  Airway Management Planned: Oral ETT  Additional Equipment:   Intra-op Plan:   Post-operative Plan: Extubation in OR  Informed Consent: I have reviewed the patients History and Physical, chart, labs and discussed the procedure including the risks, benefits and alternatives for the proposed anesthesia with the patient or authorized representative who has indicated his/her understanding and acceptance.   Dental advisory given  Plan Discussed with: CRNA and Surgeon  Anesthesia Plan Comments:         Anesthesia Quick Evaluation

## 2015-08-14 NOTE — Discharge Instructions (Signed)
1. Continue and finish antibiotics as previously prescribed. 2. Take medicines as needed for pain and spasms (Percocet/Valium). 3. Continue sitz baths several times daily. 4. Return to the ER for worsening symptoms, persistent vomiting, fever or other concerns.  Perirectal Abscess An abscess is an infected area that contains a collection of pus. A perirectal abscess is an abscess that is near the opening of the anus or around the rectum. A perirectal abscess can cause a lot of pain, especially during bowel movements. CAUSES This condition is almost always caused by an infection that starts in an anal gland. RISK FACTORS This condition is more likely to develop in:  People with diabetes or inflammatory bowel disease.  People whose body defense system (immune system) is weak.  People who have anal sex.  People who have a sexually transmitted disease (STD).  People who have certain kinds of cancers, such as rectal carcinoma, leukemia, or lymphoma. SYMPTOMS The main symptom of this condition is pain. The pain may be a throbbing pain that gets worse during bowel movements. Other symptoms include:  Fever.  Swelling.  Redness.  Bleeding.  Constipation. DIAGNOSIS The condition is diagnosed with a physical exam. If the abscess is not visible, a health care provider may need to place a finger inside the rectum to find the abscess. Sometimes, imaging tests are done to determine the size and location of the abscess. These tests may include:  An ultrasound.  An MRI.  A CT scan. TREATMENT This condition is usually treated with incision and drainage surgery. Incision and drainage surgery involves making an incision over the abscess to drain the pus. Treatment may also involve antibiotic medicine, pain medicine, stool softeners, or laxatives. HOME CARE INSTRUCTIONS  Take medicines only as directed by your health care provider.  If you were prescribed an antibiotic, finish all of it  even if you start to feel better.  To relieve pain, try sitting:  In a warm, shallow bath (sitz bath).  On a heating pad with the setting on low.  On an inflatable donut-shaped cushion.  Follow any diet instructions as directed by your health care provider.  Keep all follow-up visits as directed by your health care provider. This is important. SEEK MEDICAL CARE IF:  Your abscess is bleeding.  You have pain, swelling, or redness that is getting worse.  You are constipated.  You feel ill.  You have muscle aches or chills.  You have a fever.  Your symptoms return after the abscess has healed.   This information is not intended to replace advice given to you by your health care provider. Make sure you discuss any questions you have with your health care provider.   Document Released: 06/14/2000 Document Revised: 03/08/2015 Document Reviewed: 04/27/2014 Elsevier Interactive Patient Education [employer]

## 2015-08-14 NOTE — Progress Notes (Signed)
See Dr. Hurman Horn note from the office today that acts as a history and physical.  I reviewed the patient's history with he and his family.  Physical exam was performed showing a fluctuant area near the prior Penrose drain site on the left buttock. There is tenderness and erythema and fluctuance.  Patient requires examination under anesthesia and incision and drainage he may require Penrose drain placement for a longer period of time. The patient drank coffee approximately 1:00 and therefore because of an BO status he will need to be scheduled this evening and I will asked Dr. Orvis Brill to perform the surgery and described for her the findings and assessment the of surgery.  I discussed with the patient his family the rationale for surgery the options of observation the risks of bleeding infection and recurrence the potential for Penrose drain placement again understood and agreed to proceed

## 2015-08-14 NOTE — Anesthesia Procedure Notes (Signed)
Procedure Name: Intubation Date/Time: 08/14/2015 9:29 PM Performed by: Waldo Laine Pre-anesthesia Checklist: Emergency Drugs available, Patient identified, Suction available, Patient being monitored and Timeout performed Patient Re-evaluated:Patient Re-evaluated prior to inductionOxygen Delivery Method: Circle system utilized Preoxygenation: Pre-oxygenation with 100% oxygen Intubation Type: IV induction, Rapid sequence and Cricoid Pressure applied Laryngoscope Size: Miller and 2 Grade View: Grade I Tube type: Oral Tube size: 7.5 mm Number of attempts: 1 Airway Equipment and Method: Stylet Placement Confirmation: ETT inserted through vocal cords under direct vision,  positive ETCO2 and breath sounds checked- equal and bilateral Secured at: 21 cm Tube secured with: Tape Dental Injury: Teeth and Oropharynx as per pre-operative assessment

## 2015-08-14 NOTE — Transfer of Care (Signed)
Immediate Anesthesia Transfer of Care Note  Patient: Charles Charles  Procedure(s) Performed: Procedure(s): IRRIGATION AND DEBRIDEMENT PERIRECTAL ABSCESS (N/A)  Patient Location: PACU  Anesthesia Type:General  Level of Consciousness: awake, alert , oriented and patient cooperative  Airway & Oxygen Therapy: Patient Spontanous Breathing  Post-op Assessment: Report given to RN and Post -op Vital signs reviewed and stable  Post vital signs: Reviewed and stable  Last Vitals:  Filed Vitals:   08/14/15 1335 08/14/15 2200  BP: 113/66 155/135  Pulse: 101 94  Temp: 37.8 C 37.3 C  Resp: 18 17    Complications: No apparent anesthesia complications

## 2015-08-14 NOTE — ED Notes (Signed)
Patient ambulated to the bathroom to urinate.

## 2015-08-14 NOTE — Progress Notes (Signed)
Surgical Consultation  08/14/2015  Charles Charles is an 55 y.o. male.   Chief Complaint  Patient presents with  . Follow-up    Abscess    HPI: Charles Charles is following up after I and D of perirectal abscess (horseshoe) by Dr. Excell Seltzer 4 weeks ago. He had significant improvement of symptoms for the first 2 weeks after the procedure but recently over the last week has had increase in perirectal pain. Now the pain is severe is dull and is constant. He has some chills. He has been to multiple emergency room was including months regional early this morning and apparently at that time he refused a rectal examination. He also has been to [facility] ER. A CT scan of the pelvis was performed  2 days ago showing some improvement of the abscesses but with residual collection in the perirectal area.    Past Medical History  Diagnosis Date  . Schizo-affective schizophrenia (HCC)   . Headache   . GERD (gastroesophageal reflux disease)     Past Surgical History  Procedure Laterality Date  . Incision and drainage perirectal abscess N/A 07/14/2015    Procedure: IRRIGATION AND DEBRIDEMENT PERIRECTAL ABSCESS;  Surgeon: Lattie Haw, MD;  Location: [facility] ORS;  Service: General;  Laterality: N/A;  . Colonoscopy with propofol N/A 08/04/2015    Procedure: COLONOSCOPY WITH PROPOFOL;  Surgeon: Midge Minium, MD;  Location: [facility];  Service: Endoscopy;  Laterality: N/A;    Family History  Problem Relation Age of Onset  . Anxiety disorder Mother   . Diabetes Mother   . Diabetes Father     Social History:  reports that he has been smoking Cigarettes.  He has been smoking about 0.10 packs per day. He has never used smokeless tobacco. He reports that he drinks about 33.6 oz of alcohol per week. He reports that he does not use illicit drugs.  Allergies:  Allergies  Allergen Reactions  . Penicillins Anaphylaxis, Hives and Other (See Comments)    Has patient had a PCN reaction causing immediate rash,  facial/tongue/throat swelling, SOB or lightheadedness with hypotension: Yes Has patient had a PCN reaction causing severe rash involving mucus membranes or skin necrosis: No Has patient had a PCN reaction that required hospitalization No Has patient had a PCN reaction occurring within the last 10 years: Yes If all of the above answers are "NO", then may proceed with Cephalosporin use.    Medications reviewed.     ROS is otherwise negative other than what is stated in the history of present illness    BP 110/72 mmHg  Wt 66.225 kg (146 lb)  Physical Exam 55 year old no acute distress Cardiac: S1 and S2 no murmurs or rubs. Chest: Lungs are clear to auscultation bilaterally  Abdomen: Soft nontender no masses no peritonitis Rectal: Exquisite tenderness to palpation in the perirectal area tracking to the left gluteus and obviously exam is limited due to the severity of the pain but there is fluctuance and there is erythema and induration. Extremity: No edema well perfused Neurological: Awake alert no motor deficits he is competent    No results found for this or any previous visit (from the past 48 hour(s)). Ct Abdomen Pelvis W Contrast  08/12/2015  CLINICAL DATA:  Rectal pain. EXAM: CT ABDOMEN AND PELVIS WITH CONTRAST TECHNIQUE: Multidetector CT imaging of the abdomen and pelvis was performed using the standard protocol following bolus administration of intravenous contrast. CONTRAST:  OMNIPAQUE IOHEXOL 300 MG/ML  SOLN COMPARISON:  CT dated 07/14/2015. FINDINGS: Lower chest:  No acute findings. Hepatobiliary: No masses or other significant abnormality. Pancreas: No mass, inflammatory changes, or other significant abnormality. Spleen: Within normal limits in size and appearance. Adrenals/Urinary Tract: Adrenal glands appear normal. Tiny hypodense lesion within the lateral cortex of the left kidney is too small to definitively characterize but most likely a small cyst. Kidneys otherwise  unremarkable bilaterally without stone or hydronephrosis. No ureteral or bladder calculi identified. Bladder is unremarkable. Stomach/Bowel: The rim enhancing crescent-shaped perianal fluid collection is slightly decreased in extent compared to the earlier exam of 07/14/2015. Representative measurements as follows: Right lateral component of the fluid collection measures 5 mm diameter, previously 1.5 cm. The dominant central component of the collection measures 1.8 x 1.7 cm (previously 2.7 x 2.6 cm. Left lateral component measures 6 mm diameter, previously 1.4 cm. The thickening of the walls of the distal rectum is stable or perhaps slightly improved. The perirectal fluid stranding is stable. Underlying upper perineum is unremarkable. Large and small bowel are normal in caliber. No other area of bowel wall thickening/inflammation. Appendix is normal P Vascular/Lymphatic: No pathologically enlarged lymph nodes. No evidence of abdominal aortic aneurysm. Reproductive: No mass or other significant abnormality. Other: No free intraperitoneal air. Musculoskeletal: No suspicious bone lesions identified. Minimal degenerative change within the lumbar spine. IMPRESSION: 1. Perianal abscess collection has decreased in size with representative measurements given above. The associated thickening of the walls of the distal rectum, with surrounding perirectal fluid stranding, is stable or also slightly improved. 2. Remainder of the abdomen and pelvis CT is unremarkable, as detailed above. Electronically Signed   By: Bary Richard M.D.   On: 08/12/2015 13:41    Assessment/Plan: 55 year old male with recurrent perirectal abscesses in need for exam under anesthesia and I&D. We'll go ahead and recommend admitting to [facility] regional hospital start him on ciprofloxacin and Flagyl since he is allergic to penicillin. Start IV fluids keep him nothing by mouth and schedule him as the OR allows for an I&D today or tomorrow morning.  Discussed in detail with my partner Dr. Excell Seltzer. Lengthy discussion with the patient about the procedure, the risks benefits and possible complications. He understands and wishes to proceed  Sterling Big, MD [facility] General Surgeon

## 2015-08-14 NOTE — Op Note (Signed)
Incision and Drainage of perirectal abscess Procedure Note  Indications: This patient has a perirectal abscess as seen on CT scan and physical exam  Pre-operative Diagnosis: Perirectal abscess  Post-operative Diagnosis: same  Surgeon: Gladis Riffle   Assistants: none  Anesthesia: General endotracheal anesthesia  ASA Class: 2  Procedure Details  The patient was seen in the Holding Room. The risks, benefits, complications, treatment options, and expected outcomes were discussed with the patient. The possibilities of reaction to medication, pulmonary aspiration, bleeding, infection, the need for additional procedures, failure to diagnose a condition, and creating a complication requiring transfusion or operation were discussed with the patient. The patient concurred with the proposed plan, giving informed consent. The patient was taken to Operating Room, identified as Charles Charles and the procedure verified as Incision and drainage of perirectal abscess. A Time Out was held and the above information confirmed.  The patient was placed in supine position and anesthesia induced and patient intubated without any difficulty.  The patient was then placed in lithotomy position and anus was prepped and draped in the usual sterile fashion.   An initial examination of the anal area was performed noting a small opening of about 1 cm just to the right and posterior of the anus. This area had some purulent drainage. A rectal exam was performed noting purulent drainage from the previously mentioned area however no obvious connection. A hemostat was used to the break up loculations within the abscess cavity. This went along the right posterior edge of the rectal cavity. Finger fracturing was also used noting a cavity going up more superiorly along the side of the rectum as well. A counterincision was then made using a 10 blade scalpel and hemostat was placed through this area. A Penrose drain was then placed  through these 2 areas and sutured together with a 3-0 nylon. The area was cleaned and ABD pads were placed for dressing.    At the end of the operation all sponge, instrument and needle counts were correct.  Findings: Purulent drainage from the perirectal abscess, penrose drain placed  Estimated Blood Loss:  less than 50 mL         Drains: penrose drain into abscess cavity         Total IV Fluids:         Specimens: none              Complications:  None; patient tolerated the procedure well.         Disposition: PACU - hemodynamically stable.         Condition: stable

## 2015-08-15 ENCOUNTER — Encounter: Payer: Self-pay | Admitting: Surgery

## 2015-08-15 MED ORDER — ALUM & MAG HYDROXIDE-SIMETH 200-200-20 MG/5ML PO SUSP
30.0000 mL | ORAL | Status: DC | PRN
  Administered 2015-08-15 – 2015-08-17 (×3): 30 mL via ORAL
  Filled 2015-08-15 (×3): qty 30

## 2015-08-15 NOTE — Progress Notes (Signed)
Status post I&D of a recurrent perirectal abscess with Penrose drain placement. Cultures are pending. She feels well today is awake alert and conversant.  Lowered blood pressure slightly over normal his highest been a 107 but typically in the 90s with no concomitant tachycardia. Febrile  Wound is dressed  sTable postop but febrile recommend continuing IV antibiotics and keeping in the hospital.

## 2015-08-15 NOTE — Progress Notes (Addendum)
Initial Nutrition Assessment     INTERVENTION:  Meals and snacks: Cater to pt preferences Medical Nutrition Supplement Therapy: If unable to meet nutritional needs will add supplement.   NUTRITION DIAGNOSIS:   Inadequate oral intake related to acute illness, poor appetite as evidenced by per patient/family report.    GOAL:   Patient will meet greater than or equal to 90% of their needs    MONITOR:    (Energy intake, )  REASON FOR ASSESSMENT:   Malnutrition Screening Tool    ASSESSMENT:      Pt s/p I and D for recurrent perirectal abscess  Past Medical History  Diagnosis Date  . Schizo-affective schizophrenia (HCC)   . Headache   . GERD (gastroesophageal reflux disease)     Current Nutrition: pt reports eating but not eating as much as normal.  Did not like french fries today for lunch  Food/Nutrition-Related History: pt reports decreased intake for few days prior to admission   Scheduled Medications:  . benztropine  1 mg Oral QHS  . ciprofloxacin  400 mg Intravenous Q12H  . cyclobenzaprine  10 mg Oral TID  . enoxaparin (LOVENOX) injection  40 mg Subcutaneous Q24H  . haloperidol  5 mg Oral QHS  . ketorolac  30 mg Intravenous 4 times per day  . metronidazole  500 mg Intravenous Q8H  . PARoxetine  20 mg Oral Daily  . traZODone  50 mg Oral QHS    Continuous Medications:  . dextrose 5% lactated ringers 150 mL/hr at 08/15/15 1228     Electrolyte/Renal Profile and Glucose Profile:   Recent Labs Lab 08/12/15 1029 08/14/15 1449  NA 136 134*  K 3.9 3.7  CL 100* 98*  CO2 25 27  BUN 11 13  CREATININE 1.05 0.95  CALCIUM 9.8 9.1  GLUCOSE 122* 101*    Gastrointestinal Profile: Last BM: 2/10   Nutrition-Focused Physical Exam Findings Nutrition-Focused physical exam completed. Findings are mild fat depletion, no muscle depletion, and none edema.      Weight Change:Pt reports wt loss of 20 pounds in the last year (12% wt loss)  Diet Order:   Diet regular Room service appropriate?: Yes; Fluid consistency:: Thin  Skin:   reviewed   Height:   Ht Readings from Last 1 Encounters:  08/14/15  (1.575 m)    Weight:   Wt Readings from Last 1 Encounters:  08/14/15 141 lb 4.8 oz (64.093 kg)    Ideal Body Weight:     BMI:  Body mass index is 25.84 kg/(m^2).  Estimated Nutritional Needs:   Kcal:  BEE 1354 kcals (IF 1.1-1.3, AF 1.3) 6962-9528 kcals/d  Protein:  (1.0-1.2 g/kg) 64-77 g/d  Fluid:  (30-30ml/kg) 1920-2241ml/d  EDUCATION NEEDS:   No education needs identified at this time  MODERATE Care Level  Gwyndolyn Guilford B. Freida Busman, RD, LDN [phone] (pager) Weekend/On-Call pager [phone])

## 2015-08-15 NOTE — Progress Notes (Signed)
Spoke w/ Dr. Orvis Brill regarding low BP of 85/45. Pt reports feeling 'okay.' Dr reports this as being a trend w/ the pt. No new orders given at this time.

## 2015-08-16 MED ORDER — OXYCODONE HCL 5 MG PO TABS: 5.0000 mg | ORAL_TABLET | ORAL | Status: DC | PRN

## 2015-08-16 MED ORDER — SULFAMETHOXAZOLE-TRIMETHOPRIM 400-80 MG PO TABS: 1.0000 | ORAL_TABLET | Freq: Two times a day (BID) | ORAL | Status: DC

## 2015-08-16 MED ORDER — CALCIUM CARBONATE ANTACID 500 MG PO CHEW
2.0000 | CHEWABLE_TABLET | ORAL | Status: DC | PRN
  Administered 2015-08-16 – 2015-08-17 (×2): 400 mg via ORAL
  Filled 2015-08-16 (×2): qty 2

## 2015-08-16 NOTE — Progress Notes (Signed)
2 Days Post-Op  Subjective: Status post I&D of a recurrent perirectal abscess. Patient is feeling well tolerating a diet and his pain is improved but still present  Objective: Vital signs in last 24 hours: Temp:  [97.7 F (36.5 C)-98.4 F (36.9 C)] 98.4 F (36.9 C) (02/15 1302) Pulse Rate:  [57-73] 66 (02/15 1302) Resp:  [16-20] 17 (02/15 1302) BP: (93-98)/(51-57) 93/53 mmHg (02/15 1302) SpO2:  [97 %-100 %] 97 % (02/15 1302) Last BM Date: 08/11/15  Intake/Output from previous day: 02/14 0701 - 02/15 0700 In: 5307.5 [P.O.:600; I.V.:3607.5; IV Piggyback:1100] Out: 600 [Urine:600] Intake/Output this shift: Total I/O In: 240 [P.O.:240] Out: 0   Physical exam:  Softer area less induration Penrose drain in place no erythema  Lab Results: CBC   Recent Labs  08/14/15 1449  WBC 11.7*  HGB 13.8  HCT 39.8*  PLT 252   BMET  Recent Labs  08/14/15 1449  NA 134*  K 3.7  CL 98*  CO2 27  GLUCOSE 101*  BUN 13  CREATININE 0.95  CALCIUM 9.1   PT/INR No results for input(s): LABPROT, INR in the last 72 hours. ABG No results for input(s): PHART, HCO3 in the last 72 hours.  Invalid input(s): PCO2, PO2  Studies/Results: No results found.  Anti-infectives: Anti-infectives    Start     Dose/Rate Route Frequency Ordered Stop   08/16/15 0000  sulfamethoxazole-trimethoprim (BACTRIM) 400-80 MG tablet     1 tablet Oral 2 times daily 08/16/15 0734     08/14/15 1445  ciprofloxacin (CIPRO) IVPB 400 mg     400 mg 200 mL/hr over 60 Minutes Intravenous Every 12 hours 08/14/15 1430     08/14/15 1445  metroNIDAZOLE (FLAGYL) IVPB 500 mg     500 mg 100 mL/hr over 60 Minutes Intravenous Every 8 hours 08/14/15 1430     08/14/15 1445  ciprofloxacin (CIPRO) IVPB 400 mg  Status:  Discontinued     400 mg 200 mL/hr over 60 Minutes Intravenous Every 12 hours 08/14/15 1433 08/14/15 1448   08/14/15 1445  metroNIDAZOLE (FLAGYL) IVPB 500 mg  Status:  Discontinued     500 mg 100 mL/hr over  60 Minutes Intravenous Every 8 hours 08/14/15 1433 08/14/15 1448      Assessment/Plan: s/p Procedure(s): IRRIGATION AND DEBRIDEMENT PERIRECTAL ABSCESS   Patient doing well after drainage of a recurrent abscess we'll continue IV anabiotic's at this point and probable discharge tomorrow  Lattie Haw, MD, FACS  08/16/2015

## 2015-08-16 NOTE — Discharge Instructions (Signed)
Resume Normal activities Regular diet May shower Dry dressing to drain site as needed Follow-up with [facility] surgical on Monday for drain removal

## 2015-08-16 NOTE — Anesthesia Postprocedure Evaluation (Signed)
Anesthesia Post Note  Patient: JURELL BASISTA  Procedure(s) Performed: Procedure(s) (LRB): IRRIGATION AND DEBRIDEMENT PERIRECTAL ABSCESS (N/A)  Patient location during evaluation: PACU Anesthesia Type: General Level of consciousness: awake and alert and oriented Pain management: pain level controlled Vital Signs Assessment: post-procedure vital signs reviewed and stable Respiratory status: spontaneous breathing Cardiovascular status: blood pressure returned to baseline Anesthetic complications: no    Last Vitals:  Filed Vitals:   08/15/15 2117 08/16/15 0409  BP: 98/52 94/57  Pulse: 67 57  Temp: 36.7 C 36.5 C  Resp: 20 20    Last Pain:  Filed Vitals:   08/16/15 0905  PainSc: 8                  Mykaylah Ballman

## 2015-08-17 LAB — CREATININE, SERUM: Creatinine, Ser: 0.99 mg/dL (ref 0.61–1.24)

## 2015-08-17 NOTE — Progress Notes (Signed)
She doing very well his pain is much better controlled and he is tolerating a regular diet  Final signs are stable afebrile Wound is clean with no erythema Penrose in place  Patient doing very well recommend discharged today on oral antibiotics and pain medication to follow-up in the office next week for drain removal

## 2015-08-17 NOTE — Discharge Summary (Signed)
Physician Discharge Summary  Patient ID: DEONDRAE MCGRAIL MRN: [id number] DOB/AGE: Aug 25, 1960 55 y.o.  Admit date: 08/14/2015 Discharge date: 08/17/2015   Discharge Diagnoses:  Active Problems:   Abscess of anal and rectal regions   Peri-rectal abscess   Procedures: Drainage of recurrent perirectal abscess  Hospital Course: Patient with a recurrent perirectal abscess he was taken the operating room where drainage was performed and a Penrose drain is placed. He is tolerating a regular diet and his pain is much improved at this point cultures are pending still. He is discharged stable condition on Bactrim and oral analgesics to follow up in our office next week for drain removal.  Consults: None  Disposition: 01-Home or Self Care     Medication List    STOP taking these medications        ciprofloxacin 500 MG tablet  Commonly known as:  CIPRO     HYDROcodone-acetaminophen 5-325 MG tablet  Commonly known as:  NORCO/VICODIN     metroNIDAZOLE 500 MG tablet  Commonly known as:  FLAGYL      TAKE these medications        benztropine 1 MG tablet  Commonly known as:  COGENTIN  Take 1 mg by mouth at bedtime.     benztropine 0.5 MG tablet  Commonly known as:  COGENTIN  Take 0.5 mg by mouth daily as needed (for muscle stiffness).     diazepam 2 MG tablet  Commonly known as:  VALIUM  Take 1 tablet (2 mg total) by mouth every 8 (eight) hours as needed for muscle spasms.     haloperidol 5 MG tablet  Commonly known as:  HALDOL  Take 5 mg by mouth at bedtime.     omeprazole 20 MG capsule  Commonly known as:  PRILOSEC  Take 20 mg by mouth daily as needed.     oxyCODONE 5 MG immediate release tablet  Commonly known as:  Oxy IR/ROXICODONE  Take 1-2 tablets (5-10 mg total) by mouth every 4 (four) hours as needed for moderate pain.     oxyCODONE-acetaminophen 5-325 MG tablet  Commonly known as:  ROXICET  Take 1 tablet by mouth every 4 (four) hours as needed for severe pain.     PARoxetine 20 MG tablet  Commonly known as:  PAXIL  Take 20 mg by mouth daily.     polyethylene glycol packet  Commonly known as:  MIRALAX  Take 17 g by mouth daily as needed for mild constipation or moderate constipation.     RISPERDAL 1 MG tablet  Generic drug:  risperiDONE  Take 1 mg by mouth at bedtime.     sulfamethoxazole-trimethoprim 400-80 MG tablet  Commonly known as:  BACTRIM  Take 1 tablet by mouth 2 (two) times daily.     traZODone 50 MG tablet  Commonly known as:  DESYREL  Take 50 mg by mouth at bedtime.           Follow-up Information    Follow up with Gladis Riffle, MD In 6 days.   Specialty:  Surgery   Why:  For wound re-check, drain removal   Contact information:   [address] [place] [address] [phone]       Lattie Haw, MD, FACS

## 2015-08-17 NOTE — Progress Notes (Signed)
08/17/2015 12:46 PM  BP 91/61 mmHg  Pulse 68  Temp(Src) 97.5 F (36.4 C) (Oral)  Resp 18  Ht  (1.575 m)  Wt 64.093 kg (141 lb 4.8 oz)  BMI 25.84 kg/m2  SpO2 96% Patient discharged per MD orders. Discharge instructions reviewed with patient and patient verbalized understanding. IV removed per policy. Dressing dry and intact. Dressing supplies given to patient. Prescriptions discussed and given to patient. Discharged via wheelchair escorted by auxilary.  Ron Parker, RN

## 2015-08-23 ENCOUNTER — Encounter: Payer: Self-pay | Admitting: Surgery

## 2015-08-25 ENCOUNTER — Telehealth: Payer: Self-pay

## 2015-08-25 NOTE — Telephone Encounter (Signed)
Returned phone call at this time. Spoke with patient's girlfriend. She will have patient call back as soon as she gets back home.

## 2015-08-25 NOTE — Telephone Encounter (Signed)
Pt's mother just called wanting to reschedule pt's appt for his abscess. I advised her he would need to speak with the nurse regarding a new appt as he has missed a couple of appts. Please call. [phone].

## 2015-08-28 ENCOUNTER — Telehealth: Payer: Self-pay | Admitting: Surgery

## 2015-08-28 NOTE — Telephone Encounter (Signed)
Patients daughter called and said Charles Charles is in [place] with her and please fax over the notes for her dad to the dr. There. Request for Records is under media. Patient is having a hard time getting transportation.

## 2015-08-28 NOTE — Telephone Encounter (Signed)
Please call patient. He is in [place] now and thinks a referral to see dr but he doesn't have a dr at this time.

## 2015-09-01 ENCOUNTER — Telehealth: Payer: Self-pay | Admitting: Surgery

## 2015-09-01 NOTE — Telephone Encounter (Signed)
Returned phone call to patient's daughter. Informed her that when I had spoken to her a few hours ago she was planning on taking the patient to the Emergency Room. She states that she is on her way but wants to make an appointment. I explained that you are unable to make an appointment for the ER. She will have to go to the Emergency Room and have to wait until he is seen.

## 2015-09-01 NOTE — Telephone Encounter (Signed)
Spoke with Nurse at [facility] in [place], [place] [phone]; She explains that their surgeon did see the patient in office and patient asked for Narcotics, Physician said he would not give narcotics but area looks like it needs to be opened up once again in operating room and they would take patient to OR and get this taken care of and pain would be monitored while he was in the hospital. She states patient got up, walked out of the exam room and slammed the door. She then told patient's daughter that it would be better if he was seen by original surgeon for this problem.

## 2015-09-01 NOTE — Telephone Encounter (Signed)
I spoke with Hospital doctorAmber (nurse) about this. She said to let the daughter know that she needs to call his new doctor in [place] and tell him that the patient has relocated and now lives in [place] and that patient will not be traveling back to [place]. Patient wants to see doctor/surgeon in [place]. Nurse says there is no reason they should not see him and if they still refuse then find another doctor.I called patients daughter and gave her this information. She was understanding and said she would keep us update on patient.

## 2015-09-01 NOTE — Telephone Encounter (Signed)
Spoke with patient's daughter, she states that no surgery was offered at any point during visit with Surgeon this morning. She will bring patient to our emergency room at [facility] to have this area evaluated.

## 2015-09-01 NOTE — Telephone Encounter (Signed)
Please call patients daughter, Shanda BumpsJessica [phone]. Established patient has seen several of our physicians for a perirectal abscess.   07/14/15 was seen in ED, admitted and had surgery with Dr Excell Seltzerooper -  I&D perirectal abscess.  Saw Dr Orvis BrillLoflin 07/20/15 follow up appointment Saw Dr Servando SnareWohl 08/04/15 colonoscopy  Saw Dr Everlene FarrierPabon 08/14/15 appointment, was then admitted and had surgery I&D perirectal abscess  Patient has relocated to [place], [place] and lives with his daughter. He is having problems again with the perirectal abscess and saw a doctor at [facility]. Patients daughter says the doctor there doesn't want to remove it because there is pus and so he wants patient to return to the doctor that did his surgery. Patients daughter states that is going to be hard because they live a little over 2 hours away now. Patient is in a lot of pain and needs to be seen. Please call to advise what patient needs to do. Thanks.

## 2015-09-01 NOTE — Telephone Encounter (Signed)
Patients daughter, Charles Charles, is calling again requesting to speak with the nurse (see previous note)  She wanted to know what kind of medication patient could/couldn't take as far as OTC medications (tylenol, motrin,etc) She then stated that patient was in a lot of pain and needs something for pain and wants to know if we could write a prescription for him and they will drive up here. Please call and advise. [phone]

## 2015-09-04 ENCOUNTER — Encounter: Payer: Self-pay | Admitting: Emergency Medicine

## 2015-09-04 ENCOUNTER — Telehealth: Payer: Self-pay | Admitting: Surgery

## 2015-09-04 ENCOUNTER — Emergency Department
Admission: EM | Admit: 2015-09-04 | Discharge: 2015-09-04 | Disposition: A | Discharge status: Home / Self Care | Payer: Medicaid Other | Attending: Emergency Medicine | Admitting: Emergency Medicine

## 2015-09-04 DIAGNOSIS — Z79899 Other long term (current) drug therapy: Secondary | ICD-10-CM | POA: Insufficient documentation

## 2015-09-04 DIAGNOSIS — Z792 Long term (current) use of antibiotics: Secondary | ICD-10-CM | POA: Diagnosis not present

## 2015-09-04 DIAGNOSIS — F17210 Nicotine dependence, cigarettes, uncomplicated: Secondary | ICD-10-CM | POA: Diagnosis not present

## 2015-09-04 DIAGNOSIS — G8918 Other acute postprocedural pain: Secondary | ICD-10-CM

## 2015-09-04 DIAGNOSIS — Z9889 Other specified postprocedural states: Secondary | ICD-10-CM | POA: Diagnosis not present

## 2015-09-04 DIAGNOSIS — Z880 Allergy status to penicillin: Secondary | ICD-10-CM | POA: Insufficient documentation

## 2015-09-04 LAB — CBC WITH DIFFERENTIAL/PLATELET
Basophils Absolute: 0.1 10*3/uL (ref 0–0.1)
Basophils Relative: 1 %
EOS PCT: 5 %
Eosinophils Absolute: 0.3 10*3/uL (ref 0–0.7)
HCT: 39.6 % — ABNORMAL LOW (ref 40.0–52.0)
Hemoglobin: 13.6 g/dL (ref 13.0–18.0)
LYMPHS ABS: 3 10*3/uL (ref 1.0–3.6)
LYMPHS PCT: 48 %
MCH: 31.3 pg (ref 26.0–34.0)
MCHC: 34.3 g/dL (ref 32.0–36.0)
MCV: 91.4 fL (ref 80.0–100.0)
MONO ABS: 0.8 10*3/uL (ref 0.2–1.0)
MONOS PCT: 13 %
Neutro Abs: 2.1 10*3/uL (ref 1.4–6.5)
Neutrophils Relative %: 33 %
PLATELETS: 342 10*3/uL (ref 150–440)
RBC: 4.33 MIL/uL — AB (ref 4.40–5.90)
RDW: 14.6 % — AB (ref 11.5–14.5)
WBC: 6.3 10*3/uL (ref 3.8–10.6)

## 2015-09-04 LAB — BASIC METABOLIC PANEL
Anion gap: 6 (ref 5–15)
BUN: 13 mg/dL (ref 6–20)
CO2: 29 mmol/L (ref 22–32)
Calcium: 9.3 mg/dL (ref 8.9–10.3)
Chloride: 105 mmol/L (ref 101–111)
Creatinine, Ser: 0.82 mg/dL (ref 0.61–1.24)
GFR calc Af Amer: >60 mL/min (ref >60)
GLUCOSE: 84 mg/dL (ref 65–99)
POTASSIUM: 3.9 mmol/L (ref 3.5–5.1)
Sodium: 140 mmol/L (ref 135–145)

## 2015-09-04 MED ORDER — ALUM & MAG HYDROXIDE-SIMETH 200-200-20 MG/5ML PO SUSP
ORAL | Status: AC
  Administered 2015-09-04: 15 mL via ORAL
  Filled 2015-09-04: qty 30

## 2015-09-04 MED ORDER — ALUM & MAG HYDROXIDE-SIMETH 200-200-20 MG/5ML PO SUSP
15.0000 mL | Freq: Once | ORAL | Status: AC
  Administered 2015-09-04: 15 mL via ORAL
  Filled 2015-09-04: qty 30

## 2015-09-04 NOTE — ED Notes (Signed)
Dr. Michela PitcherEly is in the room removing the sutures and drain.

## 2015-09-04 NOTE — Telephone Encounter (Signed)
Patient came into the office and said he was still hurting and the wound was leaking. Advised him to go to the ED. He said they didn't do anything in [place] and told him to come back to the original surgeon.

## 2015-09-04 NOTE — Progress Notes (Signed)
Outpatient Surgical Follow Up  09/04/2015  Charles Charles is an 55 y.o. male.   Chief Complaint  Patient presents with  . Post-op Problem    HPI: He is seen in the emergency room with persistent drainage from his perirectal abscess drainage. It has been almost 3 weeks since his procedure and his only current problem is persistent drainage around the Penrose drain. He has not come back to the office for further evaluation.  Past Medical History  Diagnosis Date  . Schizo-affective schizophrenia ([facility])   . Headache   . GERD (gastroesophageal reflux disease)     Past Surgical History  Procedure Laterality Date  . Incision and drainage perirectal abscess N/A 07/14/2015    Procedure: IRRIGATION AND DEBRIDEMENT PERIRECTAL ABSCESS;  Surgeon: Florene Glen, MD;  Location: [facility];  Service: General;  Laterality: N/A;  . Colonoscopy with propofol N/A 08/04/2015    Procedure: COLONOSCOPY WITH PROPOFOL;  Surgeon: Lucilla Lame, MD;  Location: [facility];  Service: Endoscopy;  Laterality: N/A;  . Incision and drainage perirectal abscess N/A 08/14/2015    Procedure: IRRIGATION AND DEBRIDEMENT PERIRECTAL ABSCESS;  Surgeon: Hubbard Robinson, MD;  Location: [facility];  Service: General;  Laterality: N/A;    Family History  Problem Relation Age of Onset  . Anxiety disorder Mother   . Diabetes Mother   . Diabetes Father     Social History:  reports that he has been smoking Cigarettes.  He has been smoking about 0.10 packs per day. He has never used smokeless tobacco. He reports that he drinks about 33.6 oz of alcohol per week. He reports that he does not use illicit drugs.  Allergies:  Allergies  Allergen Reactions  . Penicillins Anaphylaxis, Hives and Other (See Comments)    Has patient had a PCN reaction causing immediate rash, facial/tongue/throat swelling, SOB or lightheadedness with hypotension: Yes Has patient had a PCN reaction causing severe rash involving mucus membranes or skin  necrosis: No Has patient had a PCN reaction that required hospitalization No Has patient had a PCN reaction occurring within the last 10 years: Yes If all of the above answers are "NO", then may proceed with Cephalosporin use.    Medications reviewed.    ROS    BP 107/47 mmHg  Pulse 71  Temp(Src) 98.1 F (36.7 C) (Oral)  Resp 16  Ht '5\' 2"'$  (1.575 m)  Wt 63.504 kg (140 lb)  BMI 25.60 kg/m2  SpO2 98%  Physical Exam he has a horseshoe drainage area in the right buttock. The Penrose drain is in the center. There does not appear to be any fluctuance. It was removed without difficulty.     Results for orders placed or performed during the hospital encounter of 09/04/15 (from the past 48 hour(s))  CBC with Differential/Platelet     Status: Abnormal   Collection Time: 09/04/15  3:27 PM  Result Value Ref Range   WBC 6.3 3.8 - 10.6 K/uL   RBC 4.33 (L) 4.40 - 5.90 MIL/uL   Hemoglobin 13.6 13.0 - 18.0 g/dL   HCT 39.6 (L) 40.0 - 52.0 %   MCV 91.4 80.0 - 100.0 fL   MCH 31.3 26.0 - 34.0 pg   MCHC 34.3 32.0 - 36.0 g/dL   RDW 14.6 (H) 11.5 - 14.5 %   Platelets 342 150 - 440 K/uL   Neutrophils Relative % 33 %   Neutro Abs 2.1 1.4 - 6.5 K/uL   Lymphocytes Relative 48 %  Lymphs Abs 3.0 1.0 - 3.6 K/uL   Monocytes Relative 13 %   Monocytes Absolute 0.8 0.2 - 1.0 K/uL   Eosinophils Relative 5 %   Eosinophils Absolute 0.3 0 - 0.7 K/uL   Basophils Relative 1 %   Basophils Absolute 0.1 0 - 0.1 K/uL  Basic metabolic panel     Status: None   Collection Time: 09/04/15  3:27 PM  Result Value Ref Range   Sodium 140 135 - 145 mmol/L   Potassium 3.9 3.5 - 5.1 mmol/L   Chloride 105 101 - 111 mmol/L   CO2 29 22 - 32 mmol/L   Glucose, Bld 84 65 - 99 mg/dL   BUN 13 6 - 20 mg/dL   Creatinine, Ser 0.82 0.61 - 1.24 mg/dL   Calcium 9.3 8.9 - 10.3 mg/dL   GFR calc non Af Amer >60 >60 mL/min   GFR calc Af Amer >60 >60 mL/min    Comment: (NOTE) The eGFR has been calculated using the CKD EPI  equation. This calculation has not been validated in all clinical situations. eGFR's persistently <60 mL/min signify possible Chronic Kidney Disease.    Anion gap 6 5 - 15   No results found.  Assessment/Plan: He seems to be doing very well the present time. I do not see any significant problems. We would like to see him back in the office next week. He does not appear to have any significant residual/recurrent abscess. This plan was discussed with him in detail.  '@DIAGMED'$ @    Dia Crawford III  '@T'$ @,negative

## 2015-09-04 NOTE — ED Notes (Signed)
Pt reports was sent here by Dr Renard HamperLoftlin's office to get him to remove a rectal abscess tube. Pt reports there was no surgeon in the office today.

## 2015-09-04 NOTE — ED Notes (Signed)
Pt states he had a rectal abscess drained and now has a drain, states he has had it for 2 weeks, here for removal

## 2015-09-04 NOTE — ED Notes (Signed)
Surgical pads and disposable underwear was given to patient to control possible discharge from surgical wound.

## 2015-09-05 ENCOUNTER — Telehealth: Payer: Self-pay

## 2015-09-05 NOTE — ED Provider Notes (Signed)
Time Seen: Approximately 1450 I have reviewed the triage notes  Chief Complaint: Post-op Problem   History of Present Illness: Charles Charles is a 55 y.o. male who was referred here to the emergency department for evaluation of his surgical site. The patient had a perirectal abscess drainage which appears non-complicated performed on 08-14-2015 patient had a Penrose drain which remained. Patient states he went to his local medical providers in [place] and they "" wouldn't touch his surgical area "". States he has persistent pain and is noticed some drainage over that area but overall he denies any fever or difficulty with bowel movements, etc. Patient states he is not currently on antibiotic therapy.  Past Medical History  Diagnosis Date  . Schizo-affective schizophrenia ([facility])   . Headache   . GERD (gastroesophageal reflux disease)     Patient Active Problem List   Diagnosis Date Noted  . Abscess of anal and rectal regions 08/14/2015  . Peri-rectal abscess 08/14/2015  . Special screening for malignant neoplasms, colon   . Perirectal abscess 07/14/2015  . Abscess, perirectal   . Stimulant abuse 12/14/2014  . Cocaine abuse 12/17/2013  . Adult hypothyroidism 12/17/2013  . 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 Blurred vision 12/17/2013    Past Surgical History  Procedure Laterality Date  . Incision and drainage perirectal abscess N/A 07/14/2015    Procedure: IRRIGATION AND DEBRIDEMENT PERIRECTAL ABSCESS;  Surgeon: Lattie Hawichard E Cooper, MD;  Location: [facility];  Service: General;  Laterality: N/A;  . Colonoscopy with propofol N/A 08/04/2015    Procedure: COLONOSCOPY WITH PROPOFOL;  Surgeon: Midge Miniumarren Wohl, MD;  [facility];  Service: Endoscopy;  Laterality: N/A;  . Incision and drainage perirectal abscess N/A 08/14/2015    Procedure: IRRIGATION AND DEBRIDEMENT PERIRECTAL ABSCESS;  Surgeon: Gladis Riffleatherine L Loflin, MD;  Location: [facility];  Service: General;  Laterality:  N/A;    Past Surgical History  Procedure Laterality Date  . Incision and drainage perirectal abscess N/A 07/14/2015    Procedure: IRRIGATION AND DEBRIDEMENT PERIRECTAL ABSCESS;  Surgeon: Lattie Hawichard E Cooper, MD;  Location: [facility];  Service: General;  Laterality: N/A;  . Colonoscopy with propofol N/A 08/04/2015    Procedure: COLONOSCOPY WITH PROPOFOL;  Surgeon: Midge Miniumarren Wohl, MD;  [facility];  Service: Endoscopy;  Laterality: N/A;  . Incision and drainage perirectal abscess N/A 08/14/2015    Procedure: IRRIGATION AND DEBRIDEMENT PERIRECTAL ABSCESS;  Surgeon: Gladis Riffleatherine L Loflin, MD;  Location: [facility];  Service: General;  Laterality: N/A;    Current Outpatient Rx  Name  Route  Sig  Dispense  Refill  . benztropine (COGENTIN) 0.5 MG tablet   Oral   Take 0.5 mg by mouth daily as needed (for muscle stiffness).         . benztropine (COGENTIN) 1 MG tablet   Oral   Take 1 mg by mouth at bedtime.         . diazepam (VALIUM) 2 MG tablet   Oral   Take 1 tablet (2 mg total) by mouth every 8 (eight) hours as needed for muscle spasms.   15 tablet   0   . haloperidol (HALDOL) 5 MG tablet   Oral   Take 5 mg by mouth at bedtime.         Marland Kitchen. omeprazole (PRILOSEC) 20 MG capsule   Oral   Take 20 mg by mouth daily as needed.         Marland Kitchen. oxyCODONE (OXY IR/ROXICODONE) 5 MG immediate  release tablet   Oral   Take 1-2 tablets (5-10 mg total) by mouth every 4 (four) hours as needed for moderate pain.   30 tablet   0   . oxyCODONE-acetaminophen (ROXICET) 5-325 MG tablet   Oral   Take 1 tablet by mouth every 4 (four) hours as needed for severe pain.   20 tablet   0   . PARoxetine (PAXIL) 20 MG tablet   Oral   Take 20 mg by mouth daily.         . polyethylene glycol (MIRALAX) packet   Oral   Take 17 g by mouth daily as needed for mild constipation or moderate constipation.   14 each   0   . risperiDONE (RISPERDAL) 1 MG tablet   Oral   Take 1 mg by mouth at bedtime.          . sulfamethoxazole-trimethoprim (BACTRIM) 400-80 MG tablet   Oral   Take 1 tablet by mouth 2 (two) times daily.   30 tablet   1   . traZODone (DESYREL) 50 MG tablet   Oral   Take 50 mg by mouth at bedtime.           Allergies:  Penicillins  Family History: Family History  Problem Relation Age of Onset  . Anxiety disorder Mother   . Diabetes Mother   . Diabetes Father     Social History: Social History  Substance Use Topics  . Smoking status: Current Every Day Smoker -- 0.10 packs/day    Types: Cigarettes  . Smokeless tobacco: Never Used     Comment: (1-2 cigs/day)  . Alcohol Use: 33.6 oz/week    56 Cans of beer per week     Comment: 2 x 40's daily - pt says no alcohol for several weeks     Review of Systems:   10 point review of systems was performed and was otherwise negative:  Constitutional: No fever Eyes: No visual disturbances ENT: No sore throat, ear pain Cardiac: No chest pain Respiratory: No shortness of breath, wheezing, or stridor Abdomen: No abdominal pain, no vomiting, No diarrhea Endocrine: No weight loss, No night sweats Extremities: No peripheral edema, cyanosis Skin: No rashes, easy bruising Neurologic: No focal weakness, trouble with speech or swollowing Urologic: No dysuria, Hematuria, or urinary frequency   Physical Exam:  ED Triage Vitals  Enc Vitals Group     BP 09/04/15 1343 124/85 mmHg     Pulse Rate 09/04/15 1343 86     Resp 09/04/15 1343 16     Temp 09/04/15 1343 98.1 F (36.7 C)     Temp Source 09/04/15 1343 Oral     SpO2 09/04/15 1343 98 %     Weight 09/04/15 1343 140 lb (63.504 kg)     Height 09/04/15 1343  (1.575 m)     Head Cir --      Peak Flow --      Pain Score 09/04/15 1344 8     Pain Loc --      Pain Edu? --      Excl. in GC? --     General: Awake , Alert , and Oriented times 3; GCS 15 Head: Normal cephalic , atraumatic Eyes: Pupils equal , round, reactive to light Nose/Throat: No nasal  drainage, patent upper airway without erythema or exudate.  Neck: Supple, Full range of motion, No anterior adenopathy or palpable thyroid masses Lungs: Clear to ascultation without wheezes , rhonchi, or rales  Heart: Regular rate, regular rhythm without murmurs , gallops , or rubs Abdomen: Soft, non tender without rebound, guarding , or rigidity; bowel sounds positive and symmetric in all 4 quadrants. No organomegaly .        Extremities: 2 plus symmetric pulses. No edema, clubbing or cyanosis Neurologic: normal ambulation, Motor symmetric without deficits, sensory intact Skin: warm, dry, no rashes Examination of the rectal area shows a Penrose drain which remains without any surrounding induration, erythema or any active drainage at this time. Normal-appearing rectal region otherwise with no surrounding cellulitis or induration  Labs:   All laboratory work was reviewed including any pertinent negatives or positives listed below:  Labs Reviewed  CBC WITH DIFFERENTIAL/PLATELET - Abnormal; Notable for the following:    RBC 4.33 (*)    HCT 39.6 (*)    RDW 14.6 (*)    All other components within normal limits  BASIC METABOLIC PANEL      ED Course:  Patient was seen here by general surgery Dr. Michela Pitcher.  The Penrose drain was removed and the patient was discharged with follow-up for outpatient repeat evaluation if necessary. Patient's laboratory work appears to be within normal limits and I felt it was unlikely that there was any further remaining abscess of significance.    Assessment: *Postoperative evaluation Previous rectal abscess surgery  Final Clinical Impression:   Final diagnoses:  Post-operative pain     Plan: * Outpatient management Patient was advised to return immediately if condition worsens. Patient was advised to follow up with their primary care physician or other specialized physicians involved in their outpatient care            Jennye Moccasin,  MD 09/05/15 [phone]

## 2015-09-05 NOTE — Telephone Encounter (Signed)
Patient seen in Emergency Room by Charles Charles last evening. Charles Charles was removed per notes in chart. Patient needs follow-up in office in 1 week.  Call made to patient at this time. Spoke with Charles Charles [phone] (patient's daughter) per his request. Appointment was made for 11:30 on 09/15/15 with Charles Charles in [place] office to follow-up in office. Asked patient to be here 10-15 minutes early to get checked in.

## 2015-09-15 ENCOUNTER — Encounter: Payer: Self-pay | Admitting: Surgery

## 2015-09-15 ENCOUNTER — Ambulatory Visit (INDEPENDENT_AMBULATORY_CARE_PROVIDER_SITE_OTHER): Payer: Medicaid Other | Admitting: Surgery

## 2015-09-15 VITALS — BP 122/63 | HR 71 | Temp 98.2°F | Ht 62.0 in | Wt 157.0 lb

## 2015-09-15 DIAGNOSIS — K611 Rectal abscess: Secondary | ICD-10-CM

## 2015-09-15 MED ORDER — POLYETHYLENE GLYCOL POWD: 17.0000 g | Freq: Every day | Status: DC

## 2015-09-15 MED ORDER — IBUPROFEN 600 MG PO TABS: 600.0000 mg | ORAL_TABLET | Freq: Four times a day (QID) | ORAL | Status: DC | PRN

## 2015-09-15 NOTE — Progress Notes (Signed)
55 year old status post perirectal abscess with drain placement back on February 13. Patient was seen in the emergency department last week and on the Penrose drain removed. Patient states that he does still have some drainage there and some occasional pain but states that is better. Patient states that he is doing the sitz baths and the tub as well which helps. Patient also states that he does have some constipation.  Filed Vitals:   09/15/15 1204  BP: 122/63  Pulse: 71  Temp: 98.2 F (36.8 C)   PE:  Gen: NAD Abd: soft, nd, nt Rectal: small area with some minimal serous drainage to the right of rectum, soft, no induration or erythema  A/P:  Patient healing well after perirectal abscess. Seems to be improving at this time. Instructed to continue the warm water baths as well as to continue drinking plenty of water and using MiraLAX as necessary to keep good normal stools and to avoid constipation. Patient does not need a follow-up in our office. If he does develop another one of these should be seen in [place] since it is a 2 Hour drive from here.

## 2015-09-15 NOTE — Patient Instructions (Signed)
Good News! You do not need to see another surgeon in [place] at this time. If another abscess develops, you will need to see someone at that time.  We have sent your prescriptions to your pharmacy in [place].  You will need to soak in a tub at least once daily, preferably twice to help this area finish healing. It will continue to drain until it is healed completely.

## 2015-10-10 LAB — ANAEROBIC CULTURE

## 2016-03-20 ENCOUNTER — Emergency Department
Admission: EM | Admit: 2016-03-20 | Discharge: 2016-03-20 | Disposition: A | Discharge status: Home / Self Care | Payer: Medicaid Other | Attending: Emergency Medicine | Admitting: Emergency Medicine

## 2016-03-20 ENCOUNTER — Encounter: Payer: Self-pay | Admitting: *Deleted

## 2016-03-20 DIAGNOSIS — F329 Major depressive disorder, single episode, unspecified: Secondary | ICD-10-CM | POA: Diagnosis not present

## 2016-03-20 DIAGNOSIS — Z791 Long term (current) use of non-steroidal anti-inflammatories (NSAID): Secondary | ICD-10-CM | POA: Diagnosis not present

## 2016-03-20 DIAGNOSIS — F17210 Nicotine dependence, cigarettes, uncomplicated: Secondary | ICD-10-CM | POA: Insufficient documentation

## 2016-03-20 DIAGNOSIS — Z79899 Other long term (current) drug therapy: Secondary | ICD-10-CM | POA: Insufficient documentation

## 2016-03-20 DIAGNOSIS — E039 Hypothyroidism, unspecified: Secondary | ICD-10-CM | POA: Diagnosis not present

## 2016-03-20 DIAGNOSIS — F209 Schizophrenia, unspecified: Secondary | ICD-10-CM | POA: Diagnosis not present

## 2016-03-20 DIAGNOSIS — F1410 Cocaine abuse, uncomplicated: Secondary | ICD-10-CM | POA: Diagnosis not present

## 2016-03-20 DIAGNOSIS — R1084 Generalized abdominal pain: Secondary | ICD-10-CM | POA: Diagnosis not present

## 2016-03-20 LAB — CBC
HEMATOCRIT: 41.8 % (ref 40.0–52.0)
HEMOGLOBIN: 14.9 g/dL (ref 13.0–18.0)
MCH: 33.2 pg (ref 26.0–34.0)
MCHC: 35.7 g/dL (ref 32.0–36.0)
MCV: 93.1 fL (ref 80.0–100.0)
Platelets: 347 10*3/uL (ref 150–440)
RBC: 4.49 MIL/uL (ref 4.40–5.90)
RDW: 13.8 % (ref 11.5–14.5)
WBC: 6 10*3/uL (ref 3.8–10.6)

## 2016-03-20 LAB — COMPREHENSIVE METABOLIC PANEL
ALT: 21 U/L (ref 17–63)
ANION GAP: 6 (ref 5–15)
AST: 25 U/L (ref 15–41)
Albumin: 3.2 g/dL — ABNORMAL LOW (ref 3.5–5.0)
Alkaline Phosphatase: 68 U/L (ref 38–126)
BILIRUBIN TOTAL: 0.5 mg/dL (ref 0.3–1.2)
BUN: 15 mg/dL (ref 6–20)
CHLORIDE: 106 mmol/L (ref 101–111)
CO2: 27 mmol/L (ref 22–32)
Calcium: 9.3 mg/dL (ref 8.9–10.3)
Creatinine, Ser: 1.04 mg/dL (ref 0.61–1.24)
Glucose, Bld: 117 mg/dL — ABNORMAL HIGH (ref 65–99)
POTASSIUM: 3.9 mmol/L (ref 3.5–5.1)
Sodium: 139 mmol/L (ref 135–145)
TOTAL PROTEIN: 7 g/dL (ref 6.5–8.1)

## 2016-03-20 LAB — URINALYSIS COMPLETE WITH MICROSCOPIC (ARMC ONLY)
BACTERIA UA: NONE SEEN
BILIRUBIN URINE: NEGATIVE
Glucose, UA: NEGATIVE mg/dL
HGB URINE DIPSTICK: NEGATIVE
Ketones, ur: NEGATIVE mg/dL
NITRITE: NEGATIVE
PH: 7 (ref 5.0–8.0)
PROTEIN: NEGATIVE mg/dL
Specific Gravity, Urine: 1.011 (ref 1.005–1.030)

## 2016-03-20 LAB — URINE DRUG SCREEN, QUALITATIVE (ARMC ONLY)
Amphetamines, Ur Screen: NOT DETECTED
BARBITURATES, UR SCREEN: NOT DETECTED
BENZODIAZEPINE, UR SCRN: NOT DETECTED
Cannabinoid 50 Ng, Ur ~~LOC~~: POSITIVE — AB
Cocaine Metabolite,Ur ~~LOC~~: POSITIVE — AB
MDMA (Ecstasy)Ur Screen: NOT DETECTED
METHADONE SCREEN, URINE: NOT DETECTED
OPIATE, UR SCREEN: NOT DETECTED
PHENCYCLIDINE (PCP) UR S: NOT DETECTED
Tricyclic, Ur Screen: NOT DETECTED

## 2016-03-20 LAB — LIPASE, BLOOD: LIPASE: 26 U/L (ref 11–51)

## 2016-03-20 MED ORDER — FAMOTIDINE 20 MG PO TABS: 20.0000 mg | ORAL_TABLET | Freq: Two times a day (BID) | ORAL | 1 refills | Status: DC

## 2016-03-20 MED ORDER — ONDANSETRON 4 MG PO TBDP
4.0000 mg | ORAL_TABLET | Freq: Once | ORAL | Status: AC
  Administered 2016-03-20: 4 mg via ORAL
  Filled 2016-03-20: qty 1

## 2016-03-20 MED ORDER — HALOPERIDOL 5 MG PO TABS: 5.0000 mg | ORAL_TABLET | Freq: Every day | ORAL | 2 refills | Status: DC

## 2016-03-20 MED ORDER — BENZTROPINE MESYLATE 1 MG PO TABS: 1.0000 mg | ORAL_TABLET | Freq: Every day | ORAL | 2 refills | Status: DC

## 2016-03-20 MED ORDER — TRAZODONE HCL 50 MG PO TABS: 50.0000 mg | ORAL_TABLET | Freq: Every day | ORAL | 2 refills | Status: DC

## 2016-03-20 MED ORDER — FAMOTIDINE 20 MG PO TABS
20.0000 mg | ORAL_TABLET | Freq: Once | ORAL | Status: AC
  Administered 2016-03-20: 20 mg via ORAL
  Filled 2016-03-20: qty 1
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
## 2016-03-20 NOTE — ED Triage Notes (Signed)
Pt complains of diffuse abdominal pain and tiredness for 1 week, pt denies any other symtptoms

## 2016-03-20 NOTE — ED Notes (Signed)

## 2016-04-09 ENCOUNTER — Encounter: Payer: Self-pay | Admitting: Emergency Medicine

## 2016-04-09 ENCOUNTER — Emergency Department
Admission: EM | Admit: 2016-04-09 | Discharge: 2016-04-10 | Disposition: A | Discharge status: Other Acute Inpatient Hospital | Payer: Medicaid Other | Attending: Emergency Medicine | Admitting: Emergency Medicine

## 2016-04-09 DIAGNOSIS — F1910 Other psychoactive substance abuse, uncomplicated: Secondary | ICD-10-CM

## 2016-04-09 DIAGNOSIS — Z79899 Other long term (current) drug therapy: Secondary | ICD-10-CM | POA: Insufficient documentation

## 2016-04-09 DIAGNOSIS — F259 Schizoaffective disorder, unspecified: Secondary | ICD-10-CM | POA: Insufficient documentation

## 2016-04-09 DIAGNOSIS — F17210 Nicotine dependence, cigarettes, uncomplicated: Secondary | ICD-10-CM | POA: Insufficient documentation

## 2016-04-09 DIAGNOSIS — F250 Schizoaffective disorder, bipolar type: Secondary | ICD-10-CM | POA: Diagnosis not present

## 2016-04-09 DIAGNOSIS — E039 Hypothyroidism, unspecified: Secondary | ICD-10-CM | POA: Diagnosis present

## 2016-04-09 DIAGNOSIS — F29 Unspecified psychosis not due to a substance or known physiological condition: Secondary | ICD-10-CM | POA: Diagnosis not present

## 2016-04-09 DIAGNOSIS — F1919 Other psychoactive substance abuse with unspecified psychoactive substance-induced disorder: Secondary | ICD-10-CM | POA: Insufficient documentation

## 2016-04-09 DIAGNOSIS — R45851 Suicidal ideations: Secondary | ICD-10-CM | POA: Diagnosis present

## 2016-04-09 DIAGNOSIS — F1420 Cocaine dependence, uncomplicated: Secondary | ICD-10-CM | POA: Diagnosis present

## 2016-04-09 HISTORY — DX: Suicidal ideations: R45.851

## 2016-04-09 LAB — COMPREHENSIVE METABOLIC PANEL
ALBUMIN: 3.8 g/dL (ref 3.5–5.0)
ALK PHOS: 62 U/L (ref 38–126)
ALT: 16 U/L — AB (ref 17–63)
AST: 23 U/L (ref 15–41)
Anion gap: 12 (ref 5–15)
BUN: 12 mg/dL (ref 6–20)
CHLORIDE: 104 mmol/L (ref 101–111)
CO2: 22 mmol/L (ref 22–32)
CREATININE: 0.99 mg/dL (ref 0.61–1.24)
Calcium: 9.7 mg/dL (ref 8.9–10.3)
GFR calc Af Amer: >60 mL/min (ref >60)
GFR calc non Af Amer: >60 mL/min (ref >60)
GLUCOSE: 98 mg/dL (ref 65–99)
Potassium: 3.8 mmol/L (ref 3.5–5.1)
SODIUM: 138 mmol/L (ref 135–145)
Total Bilirubin: 0.3 mg/dL (ref 0.3–1.2)
Total Protein: 7.9 g/dL (ref 6.5–8.1)

## 2016-04-09 LAB — URINE DRUG SCREEN, QUALITATIVE (ARMC ONLY)
AMPHETAMINES, UR SCREEN: NOT DETECTED
BARBITURATES, UR SCREEN: NOT DETECTED
BENZODIAZEPINE, UR SCRN: NOT DETECTED
Cannabinoid 50 Ng, Ur ~~LOC~~: NOT DETECTED
Cocaine Metabolite,Ur ~~LOC~~: POSITIVE — AB
MDMA (Ecstasy)Ur Screen: NOT DETECTED
METHADONE SCREEN, URINE: NOT DETECTED
Opiate, Ur Screen: NOT DETECTED
PHENCYCLIDINE (PCP) UR S: NOT DETECTED
Tricyclic, Ur Screen: NOT DETECTED

## 2016-04-09 LAB — CBC
HCT: 44.5 % (ref 40.0–52.0)
Hemoglobin: 15.3 g/dL (ref 13.0–18.0)
MCH: 32.5 pg (ref 26.0–34.0)
MCHC: 34.5 g/dL (ref 32.0–36.0)
MCV: 94.2 fL (ref 80.0–100.0)
PLATELETS: 342 10*3/uL (ref 150–440)
RBC: 4.72 MIL/uL (ref 4.40–5.90)
RDW: 13.3 % (ref 11.5–14.5)
WBC: 9.2 10*3/uL (ref 3.8–10.6)

## 2016-04-09 LAB — ACETAMINOPHEN LEVEL: Acetaminophen (Tylenol), Serum: <10 ug/mL — ABNORMAL LOW (ref 10–30)

## 2016-04-09 LAB — SALICYLATE LEVEL

## 2016-04-09 LAB — ETHANOL: Alcohol, Ethyl (B): 61 mg/dL — ABNORMAL HIGH (ref <5)

## 2016-04-09 MED ORDER — BENZTROPINE MESYLATE 1 MG PO TABS
0.5000 mg | ORAL_TABLET | Freq: Two times a day (BID) | ORAL | Status: DC
Start: 2016-04-09 — End: 2016-04-10
  Administered 2016-04-09 – 2016-04-10 (×2): 0.5 mg via ORAL
  Filled 2016-04-09 (×2): qty 1

## 2016-04-09 MED ORDER — TRAZODONE HCL 50 MG PO TABS
50.0000 mg | ORAL_TABLET | Freq: Every day | ORAL | Status: DC
  Administered 2016-04-09: 50 mg via ORAL
  Filled 2016-04-09: qty 1

## 2016-04-09 MED ORDER — LEVOTHYROXINE SODIUM 25 MCG PO TABS
50.0000 ug | ORAL_TABLET | Freq: Every day | ORAL | Status: DC
  Administered 2016-04-10: 50 ug via ORAL
  Filled 2016-04-09 (×2): qty 2

## 2016-04-09 MED ORDER — HALOPERIDOL 5 MG PO TABS
5.0000 mg | ORAL_TABLET | Freq: Every day | ORAL | Status: DC
  Administered 2016-04-09: 5 mg via ORAL
  Filled 2016-04-09: qty 1

## 2016-04-09 NOTE — ED Triage Notes (Signed)
Pt reports SI since last night, reports hearing voices that are telling him to hurt himself and others. Pt reports recent alcohol and cocaine use in the past 24 hours.

## 2016-04-09 NOTE — ED Notes (Signed)
Pt threw away all clothing except belt.

## 2016-04-09 NOTE — BH Assessment (Signed)
Assessment Note  Charles Charles is an 55 y.o. male presenting to the ED with concerns of hearing voices telling him to hurt himself or hurt someone else.  He says that he is feeling overwhelmed and run down.  He reports weight loss and insomnia.  He says he has been noncompliant with medications and has not taken any meds in a year.  He reports drinking alcohol and using cocaine as a coping mechanism. He reports drinking one beer today and using a rock of cocaine yesterday.  Diagnosis: Schizo-affective schizophrenia  Past Medical History:  Past Medical History:  Diagnosis Date  . GERD (gastroesophageal reflux disease)   . Headache   . Schizo-affective schizophrenia [facility])     Past Surgical History:  Procedure Laterality Date  . COLONOSCOPY WITH PROPOFOL N/A 08/04/2015   Procedure: COLONOSCOPY WITH PROPOFOL;  Surgeon: Midge Minium, MD;  Location[facility];  Service: Endoscopy;  Laterality: N/A;  . INCISION AND DRAINAGE PERIRECTAL ABSCESS N/A 07/14/2015   Procedure: IRRIGATION AND DEBRIDEMENT PERIRECTAL ABSCESS;  Surgeon: Lattie Haw, MD;  Location: [facility] OR[facility];  Service: General;  Laterality: N/A;  . INCISION AND DRAINAGE PERIRECTAL ABSCESS N/A 08/14/2015   Procedure: IRRIGATION AND DEBRIDEMENT PERIRECTAL ABSCESS;  Surgeon: Gladis Riffle, MD;  Location: [facility];  Service: General;  Laterality: N/A;    Family History:  Family History  Problem Relation Age of Onset  . Anxiety disorder Mother   . Diabetes Mother   . Diabetes Father     Social History:  reports that he has been smoking Cigarettes.  He has been smoking about 0.10 packs per day. He has never used smokeless tobacco. He reports that he drinks about 33.6 oz of alcohol per week . He reports that he does not use drugs.  Additional Social History:  Alcohol / Drug Use History of alcohol / drug use?: Yes (Patient reports a history of alcohol cocaine abuse.)  CIWA: CIWA-Ar BP: 110/62 Pulse Rate: 83 COWS:     Allergies:  Allergies  Allergen Reactions  . Penicillins Anaphylaxis, Hives and Other (See Comments)    Has patient had a PCN reaction causing immediate rash, facial/tongue/throat swelling, SOB or lightheadedness with hypotension: Yes Has patient had a PCN reaction causing severe rash involving mucus membranes or skin necrosis: No Has patient had a PCN reaction that required hospitalization No Has patient had a PCN reaction occurring within the last 10 years: Yes If all of the above answers are "NO", then may proceed with Cephalosporin use.    Home Medications:  (Not in a hospital admission)  OB/GYN Status:  No LMP for male patient.  General Assessment Data Location of Assessment: [facility] ED TTS Assessment: In system Is this a Tele or Face-to-Face Assessment?: Face-to-Face Is this an Initial Assessment or a Re-assessment for this encounter?: Initial Assessment Marital status: Single Maiden name: n/a Is patient pregnant?: No Pregnancy Status: No Living Arrangements: Non-relatives/Friends Can pt return to current living arrangement?: Yes Admission Status: Involuntary Is patient capable of signing voluntary admission?: Yes Referral Source: Self/Family/Friend Insurance type: Medicaid  Medical Screening Exam [facility] Walk-in ONLY) Medical Exam completed: Yes  Crisis Care Plan Living Arrangements: Non-relatives/Friends Legal Guardian: Other: Name of Psychiatrist: [facility] Name of Therapist: [facility]  Education Status Is patient currently in school?: No Current Grade: n/a Highest grade of school patient has completed: n/a Name of school: n/a Contact person: n/a  Risk to self with the past 6 months Suicidal Ideation: Yes-Currently Present Has patient been a  risk to self within the past 6 months prior to admission? : No Suicidal Intent: No Has patient had any suicidal intent within the past 6 months prior to admission? : No Is patient at risk for suicide?: Yes Suicidal Plan?: No Has  patient had any suicidal plan within the past 6 months prior to admission? : No Access to Means: Yes Specify Access to Suicidal Means: Patient has access to illegal drugs What has been your use of drugs/alcohol within the last 12 months?: alcohol, crack cocaine Previous Attempts/Gestures: Yes How many times?: 1 Other Self Harm Risks: drug use Triggers for Past Attempts: Family contact Intentional Self Injurious Behavior: None Family Suicide History: No Recent stressful life event(s): Conflict (Comment) Persecutory voices/beliefs?: No Depression: Yes Depression Symptoms: Insomnia, Loss of interest in usual pleasures, Feeling worthless/self pity, Feeling angry/irritable Substance abuse history and/or treatment for substance abuse?: Yes Suicide prevention information given to non-admitted patients: Not applicable  Risk to Others within the past 6 months Homicidal Ideation: No Does patient have any lifetime risk of violence toward others beyond the six months prior to admission? : No Thoughts of Harm to Others: No Current Homicidal Intent: No Current Homicidal Plan: No Access to Homicidal Means: No Identified Victim: none identified History of harm to others?: No Assessment of Violence: None Noted Violent Behavior Description: none identified Does patient have access to weapons?: No Criminal Charges Pending?: No Does patient have a court date: No Is patient on probation?: No  Psychosis Hallucinations: Auditory Delusions: None noted  Mental Status Report Appearance/Hygiene: In scrubs Eye Contact: Fair Motor Activity: Freedom of movement Speech: Logical/coherent Level of Consciousness: Quiet/awake Mood: Depressed Affect: Appropriate to circumstance Anxiety Level: Minimal Thought Processes: Relevant Judgement: Partial Orientation: Person, Place, Time, Situation Obsessive Compulsive Thoughts/Behaviors: None  Cognitive Functioning Concentration: Normal Memory: Recent  Intact, Remote Intact IQ: Average Insight: Fair Impulse Control: Fair Appetite: Poor Sleep: Decreased Total Hours of Sleep: 4 Vegetative Symptoms: None  ADLScreening [facility] Assessment Services) Patient's cognitive ability adequate to safely complete daily activities?: Yes Patient able to express need for assistance with ADLs?: Yes Independently performs ADLs?: Yes (appropriate for developmental age)  Prior Inpatient Therapy Prior Inpatient Therapy: No Prior Therapy Dates: n/a Prior Therapy Facilty/Provider(s): n/a Reason for Treatment: n/a  Prior Outpatient Therapy Prior Outpatient Therapy: Yes Prior Therapy Dates: current Prior Therapy Facilty/Provider(s): [facility] Reason for Treatment: schizophrenia Does patient have an ACCT team?: No Does patient have Intensive In-House Services?  : No Does patient have [facility] services? : No Does patient have P4CC services?: No  ADL Screening (condition at time of admission) Patient's cognitive ability adequate to safely complete daily activities?: Yes Patient able to express need for assistance with ADLs?: Yes Independently performs ADLs?: Yes (appropriate for developmental age)       Abuse/Neglect Assessment (Assessment to be complete while patient is alone) Physical Abuse: Denies Verbal Abuse: Denies Sexual Abuse: Denies Exploitation of patient/patient's resources: Denies Self-Neglect: Denies Values / Beliefs Cultural Requests During Hospitalization: None Spiritual Requests During Hospitalization: None Consults Spiritual Care Consult Needed: No Social Work Consult Needed: No Merchant navy officerAdvance Directives (For Healthcare) Does patient have an advance directive?: No    Additional Information 1:1 In Past 12 Months?: No CIRT Risk: No Elopement Risk: No Does patient have medical clearance?: Yes     Disposition:  Disposition Initial Assessment Completed for this Encounter: Yes Disposition of Patient: Inpatient treatment program,  Referred to Type of inpatient treatment program: Adult Patient referred to:  [facility] Behavioral medical unit)  On Site Evaluation by:   Reviewed with Physician:    [place] 04/09/2016 9:42 PM

## 2016-04-09 NOTE — ED Notes (Signed)
Pt is alert and oriented on admission this evening. Pt mood is anxious/sad, but he is pleasant and cooperative with staff. Pt endorses passive SI but he does contract for safety with staff. Writer provided fluids, medications and 15 minute checks are ongoing for safety. Pt went to sleep upon arriving to the unit.
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
## 2016-04-09 NOTE — ED Provider Notes (Signed)
[facility] Emergency Department Provider Note  Time seen: 7:32 PM  I have reviewed the triage vital signs and the nursing notes.   HISTORY  Chief Complaint Suicidal    HPI Charles Charles is a 55 y.o. male with a past medical history of schizophrenia who presents the emergency department. Voices telling him to kill himself or others. According to the patient he has been off his medications for the past 6 months. Admits to recent alcohol and cocaine use earlier today. States today his hearing voices that are telling him to kill himself or kill other people. Denies any medical complaints.  Past Medical History:  Diagnosis Date  . GERD (gastroesophageal reflux disease)   . Headache   . Schizo-affective schizophrenia [place]([facility])     Patient Active Problem List   Diagnosis Date Noted  . Stimulant abuse 12/14/2014  . Cocaine abuse 12/17/2013  . Adult hypothyroidism 12/17/2013  . 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 Blurred vision 12/17/2013    Past Surgical History:  Procedure Laterality Date  . COLONOSCOPY WITH PROPOFOL N/A 08/04/2015   Procedure: COLONOSCOPY WITH PROPOFOL;  Surgeon: Midge Miniumarren Wohl, MD;  [facility];  Service: Endoscopy;  Laterality: N/A;  . INCISION AND DRAINAGE PERIRECTAL ABSCESS N/A 07/14/2015   Procedure: IRRIGATION AND DEBRIDEMENT PERIRECTAL ABSCESS;  Surgeon: Lattie Hawichard E Cooper, MD;  Location: [facility];  Service: General;  Laterality: N/A;  . INCISION AND DRAINAGE PERIRECTAL ABSCESS N/A 08/14/2015   Procedure: IRRIGATION AND DEBRIDEMENT PERIRECTAL ABSCESS;  Surgeon: Gladis Riffleatherine L Loflin, MD;  Location: [facility];  Service: General;  Laterality: N/A;    Prior to Admission medications   Medication Sig Start Date End Date Taking? Authorizing Provider  benztropine (COGENTIN) 1 MG tablet Take 1 tablet (1 mg total) by mouth daily. 03/20/16 03/20/17  Emily FilbertJonathan E Williams, MD  famotidine (PEPCID) 20 MG tablet Take 1 tablet (20 mg  total) by mouth 2 (two) times daily. 03/20/16   Emily FilbertJonathan E Williams, MD  haloperidol (HALDOL) 5 MG tablet Take 1 tablet (5 mg total) by mouth at bedtime. 03/20/16   Emily FilbertJonathan E Williams, MD  ibuprofen (ADVIL,MOTRIN) 600 MG tablet Take 1 tablet (600 mg total) by mouth every 6 (six) hours as needed. 09/15/15   Gladis Riffleatherine L Loflin, MD  Polyethylene Glycol POWD Take 17 g by mouth daily. 09/15/15   Gladis Riffleatherine L Loflin, MD  traZODone (DESYREL) 50 MG tablet Take 1 tablet (50 mg total) by mouth at bedtime. 03/20/16   Emily FilbertJonathan E Williams, MD    Allergies  Allergen Reactions  . Penicillins Anaphylaxis, Hives and Other (See Comments)    Has patient had a PCN reaction causing immediate rash, facial/tongue/throat swelling, SOB or lightheadedness with hypotension: Yes Has patient had a PCN reaction causing severe rash involving mucus membranes or skin necrosis: No Has patient had a PCN reaction that required hospitalization No Has patient had a PCN reaction occurring within the last 10 years: Yes If all of the above answers are "NO", then may proceed with Cephalosporin use.    Family History  Problem Relation Age of Onset  . Anxiety disorder Mother   . Diabetes Mother   . Diabetes Father     Social History Social History  Substance Use Topics  . Smoking status: Current Every Day Smoker    Packs/day: 0.10    Types: Cigarettes  . Smokeless tobacco: Never Used     Comment: (1-2 cigs/day)  . Alcohol use 33.6 oz/week    56 Cans  of beer per week     Comment: 2 x 40's daily - pt says no alcohol for several weeks    Review of Systems Constitutional: Negative for fever. Cardiovascular: Negative for chest pain. Respiratory: Negative for shortness of breath. Gastrointestinal: Negative for abdominal pain Neurological: Negative for headache 10-point ROS otherwise negative.  ____________________________________________   PHYSICAL EXAM:  VITAL SIGNS: ED Triage Vitals  Enc Vitals Group     BP 04/09/16  1836 110/62     Pulse Rate 04/09/16 1836 83     Resp 04/09/16 1836 16     Temp 04/09/16 1836 98.6 F (37 C)     Temp Source 04/09/16 1836 Oral     SpO2 04/09/16 1836 97 %     Weight --      Height --      Head Circumference --      Peak Flow --      Pain Score 04/09/16 1844 10     Pain Loc --      Pain Edu? --      Excl. in GC? --     Constitutional: Alert and oriented. Well appearing and in no distress. Eyes: Normal exam ENT   Head: Normocephalic and atraumatic.   Mouth/Throat: Mucous membranes are moist. Cardiovascular: Normal rate, regular rhythm. No murmur Respiratory: Normal respiratory effort without tachypnea nor retractions. Breath sounds are clear Gastrointestinal: Soft and nontender. No distention.   Musculoskeletal: Nontender with normal range of motion in all extremities. Neurologic:  Normal speech and language. No gross focal neurologic deficits  Skin:  Skin is warm, dry and intact.  Psychiatric: Patient states he is hearing voices with SI/HI. Appears to be acting overall appropriate in the emergency department.  ____________________________________________    INITIAL IMPRESSION / ASSESSMENT AND PLAN / ED COURSE  Pertinent labs & imaging results that were available during my care of the patient were reviewed by me and considered in my medical decision making (see chart for details).  Patient presents the emergency department hearing voices telling him to kill himself and others. We'll place the patient under an involuntary commitment. Patient's labs are largely within normal limits besides an alcohol level of 61 and positive cocaine on urine toxicology. Patient is medically clear at this time, currently awaiting psychiatric evaluation.  Patient has been seen by psychiatry, and they'll admit to their service for further treatment.  ____________________________________________   FINAL CLINICAL IMPRESSION(S) / ED DIAGNOSES  Substance abuse Auditory  hallucinations Suicidal ideation    Minna Antis, MD 04/09/16 1946

## 2016-04-10 ENCOUNTER — Inpatient Hospital Stay
Admission: RE | Admit: 2016-04-10 | Discharge: 2016-04-16 | DRG: 885 | Disposition: A | Discharge status: Home / Self Care | Payer: Medicaid Other | Source: Intra-hospital | Attending: Psychiatry | Admitting: Psychiatry

## 2016-04-10 ENCOUNTER — Encounter: Payer: Self-pay | Admitting: Psychiatry

## 2016-04-10 DIAGNOSIS — R1013 Epigastric pain: Secondary | ICD-10-CM

## 2016-04-10 DIAGNOSIS — F29 Unspecified psychosis not due to a substance or known physiological condition: Secondary | ICD-10-CM | POA: Diagnosis not present

## 2016-04-10 DIAGNOSIS — E039 Hypothyroidism, unspecified: Secondary | ICD-10-CM | POA: Diagnosis present

## 2016-04-10 DIAGNOSIS — R079 Chest pain, unspecified: Secondary | ICD-10-CM

## 2016-04-10 DIAGNOSIS — F1420 Cocaine dependence, uncomplicated: Secondary | ICD-10-CM | POA: Diagnosis present

## 2016-04-10 DIAGNOSIS — F1020 Alcohol dependence, uncomplicated: Secondary | ICD-10-CM

## 2016-04-10 DIAGNOSIS — R45851 Suicidal ideations: Secondary | ICD-10-CM | POA: Diagnosis present

## 2016-04-10 DIAGNOSIS — F17210 Nicotine dependence, cigarettes, uncomplicated: Secondary | ICD-10-CM | POA: Diagnosis present

## 2016-04-10 DIAGNOSIS — G4700 Insomnia, unspecified: Secondary | ICD-10-CM | POA: Diagnosis present

## 2016-04-10 DIAGNOSIS — Z9114 Patient's other noncompliance with medication regimen: Secondary | ICD-10-CM

## 2016-04-10 DIAGNOSIS — F203 Undifferentiated schizophrenia: Secondary | ICD-10-CM | POA: Diagnosis present

## 2016-04-10 DIAGNOSIS — R45850 Homicidal ideations: Secondary | ICD-10-CM | POA: Diagnosis present

## 2016-04-10 DIAGNOSIS — K219 Gastro-esophageal reflux disease without esophagitis: Secondary | ICD-10-CM | POA: Diagnosis present

## 2016-04-10 DIAGNOSIS — F17200 Nicotine dependence, unspecified, uncomplicated: Secondary | ICD-10-CM | POA: Diagnosis present

## 2016-04-10 DIAGNOSIS — Z818 Family history of other mental and behavioral disorders: Secondary | ICD-10-CM | POA: Diagnosis not present

## 2016-04-10 HISTORY — DX: Alcohol dependence, uncomplicated: F10.20

## 2016-04-10 MED ORDER — HYDROXYZINE HCL 25 MG PO TABS
25.0000 mg | ORAL_TABLET | Freq: Three times a day (TID) | ORAL | Status: DC | PRN
  Administered 2016-04-14: 25 mg via ORAL
  Filled 2016-04-10: qty 1

## 2016-04-10 MED ORDER — ALUM & MAG HYDROXIDE-SIMETH 200-200-20 MG/5ML PO SUSP: 30.0000 mL | ORAL | Status: DC | PRN

## 2016-04-10 MED ORDER — FAMOTIDINE 20 MG PO TABS
20.0000 mg | ORAL_TABLET | Freq: Two times a day (BID) | ORAL | Status: DC
  Administered 2016-04-10 – 2016-04-16 (×12): 20 mg via ORAL
  Filled 2016-04-10 (×12): qty 1

## 2016-04-10 MED ORDER — TRAZODONE HCL 50 MG PO TABS
50.0000 mg | ORAL_TABLET | Freq: Every day | ORAL | Status: DC
  Administered 2016-04-10 – 2016-04-15 (×6): 50 mg via ORAL
  Filled 2016-04-10 (×6): qty 1

## 2016-04-10 MED ORDER — BENZTROPINE MESYLATE 1 MG PO TABS
0.5000 mg | ORAL_TABLET | Freq: Two times a day (BID) | ORAL | Status: DC
  Administered 2016-04-10 – 2016-04-13 (×6): 0.5 mg via ORAL
  Filled 2016-04-10 (×6): qty 1

## 2016-04-10 MED ORDER — ACETAMINOPHEN 325 MG PO TABS: 650.0000 mg | ORAL_TABLET | Freq: Four times a day (QID) | ORAL | Status: DC | PRN

## 2016-04-10 MED ORDER — MAGNESIUM HYDROXIDE 400 MG/5ML PO SUSP: 30.0000 mL | Freq: Every day | ORAL | Status: DC | PRN

## 2016-04-10 MED ORDER — LEVOTHYROXINE SODIUM 25 MCG PO TABS
50.0000 ug | ORAL_TABLET | Freq: Every day | ORAL | Status: DC
  Administered 2016-04-11 – 2016-04-16 (×6): 50 ug via ORAL
  Filled 2016-04-10 (×7): qty 2

## 2016-04-10 MED ORDER — HALOPERIDOL 5 MG PO TABS
5.0000 mg | ORAL_TABLET | Freq: Every day | ORAL | Status: DC
  Administered 2016-04-10 – 2016-04-15 (×6): 5 mg via ORAL
  Filled 2016-04-10 (×6): qty 1
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
## 2016-04-10 NOTE — [facility] Suicide Risk Assessment (Signed)
[facility] INPATIENT:  Family/Significant Other Suicide Prevention Education  Suicide Prevention Education:  Education Completed; mother, Blair HeysBetty Harvey ph#: [phone] has been identified by the patient as the family member/significant other with whom the patient will be residing, and identified as the person(s) who will aid the patient in the event of a mental health crisis (suicidal ideations/suicide attempt).  With written consent from the patient, the family member/significant other has been provided the following suicide prevention education, prior to the and/or following the discharge of the patient. Pt's mother stated that she will consider letting patient stay with her but has not made a concrete decision at this time.  The suicide prevention education provided includes the following:  Suicide risk factors  Suicide prevention and interventions  National Suicide Hotline telephone number  [facility] assessment telephone number  [facility] Emergency Assistance 911  [facility] and/or Residential Mobile Crisis Unit telephone number  Request made of family/significant other to:  Remove weapons (e.g., guns, rifles, knives), all items previously/currently identified as safety concern.    Remove drugs/medications (over-the-counter, prescriptions, illicit drugs), all items previously/currently identified as a safety concern.  The family member/significant other verbalizes understanding of the suicide prevention education information provided.  The family member/significant other agrees to remove the items of safety concern listed above.  Lynden OxfordKadijah R Felise Georgia, MSW, LCSW-A 04/10/2016, 4:01 PM

## 2016-04-10 NOTE — BHH Counselor (Signed)
Adult Comprehensive Assessment  Patient ID: Charles Charles, male   DOB: January 03, 1961, 55 y.o.   MRN: [id number]  Information Source: Information source: Patient  Current Stressors:  Educational / Learning stressors: No stressors identified  Employment / Job issues: No stressors identified  Family Relationships: No stressors identified  Surveyor, quantity / Lack of resources (include bankruptcy): No stressors identified  Housing / Lack of housing: No stressors identified  Physical health (include injuries & life threatening diseases): No stressors identified  Social relationships: No stressors identified  Substance abuse: Alcohol and cocaine use  Bereavement / Loss: No stressors identified   Living/Environment/Situation:  Living Arrangements: Non-relatives/Friends Living conditions (as described by patient or guardian): They are alright, a lot of drinking and people that live there How long has patient lived in current situation?: About two years  What is atmosphere in current home: Chaotic, Temporary  Family History:  Marital status: Separated Separated, when?: A couple years  What types of issues is patient dealing with in the relationship?: Unknown  What is your sexual orientation?: Straight  Has your sexual activity been affected by drugs, alcohol, medication, or emotional stress?: N/A Does patient have children?: Yes How many children?: 7 How is patient's relationship with their children?: 5 girls, two boys - does not see children very often   Childhood History:  By whom was/is the patient raised?: Mother Description of patient's relationship with caregiver when they were a child: "It was alright"  Patient's description of current relationship with people who raised him/her: "It was alright" How were you disciplined when you got in trouble as a child/adolescent?: Whoopings  Does patient have siblings?: Yes Number of Siblings: 2 Description of patient's current relationship with siblings:  One brother and one sister; no relationship at all Did patient suffer any verbal/emotional/physical/sexual abuse as a child?: No Did patient suffer from severe childhood neglect?: No Has patient ever been sexually abused/assaulted/raped as an adolescent or adult?: No Was the patient ever a victim of a crime or a disaster?: No Witnessed domestic violence?: No Has patient been effected by domestic violence as an adult?: No  Education:  Highest grade of school patient has completed: 8th or 9th grade Currently a student?: No Learning disability?: Yes (Can't read or write ) What learning problems does patient have?: Cannot read or write   Employment/Work Situation:   Employment situation: On disability Why is patient on disability: Mental health  How long has patient been on disability: Since 1998 Patient's job has been impacted by current illness: No What is the longest time patient has a held a job?: About two years  Where was the patient employed at that time?: [employer]  Has patient ever been in the [employer]?: No Has patient ever served in combat?: No Did You Receive Any Psychiatric Treatment/Services While in Equities trader?: No Are There Guns or Other Weapons in Your Home?: No Are These Comptroller?:  (N/A)  Financial Resources:   Surveyor, quantity resources: Sales executive, Medicaid, Insurance claims handler Does patient have a Lawyer or guardian?: No  Alcohol/Substance Abuse:   What has been your use of drugs/alcohol within the last 12 months?: Alcohol and crack cocaine  If attempted suicide, did drugs/alcohol play a role in this?: No Alcohol/Substance Abuse Treatment Hx: Denies past history Has alcohol/substance abuse ever caused legal problems?: Yes (DUI)  Social Support System:   Patient's Community Support System: Fair Museum/gallery exhibitions officer System: Does not identify a strong support system at this time Type of faith/religion:  None identified  How does patient's  faith help to cope with current illness?: None identified   Leisure/Recreation:   Leisure and Hobbies: USe to love playing basketball - does not do anything currently  Strengths/Needs:   What things does the patient do well?: Good at playing basketball and dancing  In what areas does patient struggle / problems for patient: Reading, writing  Discharge Plan:   Does patient have access to transportation?: No Plan for no access to transportation at discharge: [employer] bus Will patient be returning to same living situation after discharge?: Yes Currently receiving community mental health services: Yes (From Whom) ([facility] ) Does patient have financial barriers related to discharge medications?: No  Summary/Recommendations:   Summary and Recommendations (to be completed by the evaluator): Patient presented to the hospital voluntarily and was admitted for AVH.  Patient is a [age] year AA maleb with a primary diagnosis of schizophrenia.  Pt reports primary triggers for admission were hearing voices telling him to hurt himself and/or others.  Pt now denies SI/HI/AVH.  [place]  Pt lists supports in the community as his mother.  Patient will benefit from crisis stabilization, medication evaluation, group therapy, and psycho education in addition to case management for discharge planning. Patient and CSW reviewed pt's identified goals and treatment plan. Pt verbalized understanding and agreed to treatment plan.  At discharge it is recommended that patient remain compliant with established plan and continue treatment.  Lynden OxfordKadijah R Lyall Faciane, MSW, LCSW-A  04/10/2016

## 2016-04-10 NOTE — ED Notes (Signed)
Pt admitted ivc to unit

## 2016-04-10 NOTE — Plan of Care (Signed)
Problem: Education: Goal: Ability to make informed decisions regarding treatment will improve Outcome: Not Progressing New Adfmission continue to voice of suicidal ideations

## 2016-04-10 NOTE — [facility] Suicide Risk Assessment (Signed)
[facility]   Nursing information obtained from:    Demographic factors:    Current Mental Status:    Loss Factors:    Historical Factors:    Risk Reduction Factors:     Total Time spent with patient: 1 hour Principal Problem: Undifferentiated schizophrenia (HCC) Diagnosis:   Patient Active Problem List   Diagnosis Date Noted  . Undifferentiated schizophrenia (HCC) [F20.3] 04/10/2016  . Alcohol use disorder, moderate, dependence (HCC) [F10.20] 04/10/2016  . GERD (gastroesophageal reflux disease) [K21.9] 04/10/2016  . Tobacco use disorder [F17.200] 04/10/2016  . Suicidal ideation [R45.851] 04/09/2016  . Stimulant abuse [F15.10] 12/14/2014  . Cocaine use disorder, moderate, dependence (HCC) [F14.20] 12/17/2013  . Adult hypothyroidism [E03.9] 12/17/2013  . 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 Blurred vision [H53.8] 12/17/2013   Subjective Data: psychosis, suicidal and homicidal ideation.  Continued Clinical Symptoms:    The "Alcohol Use Disorders Identification Test", Guidelines for Use in Primary Care, Second Edition.  World Science writerHealth Organization [facility](WHO). Score between 0-7:  no or low risk or alcohol related problems. Score between 8-15:  moderate risk of alcohol related problems. Score between 16-19:  high risk of alcohol related problems. Score 20 or above:  warrants further diagnostic evaluation for alcohol dependence and treatment.   CLINICAL FACTORS:   Alcohol/Substance Abuse/Dependencies Schizophrenia:   Command hallucinatons Depressive state Paranoid or undifferentiated type   Musculoskeletal: Strength & Muscle Tone: within normal limits Gait & Station: normal Patient leans: N/A  Psychiatric Specialty Exam: Physical Exam  Nursing note and vitals reviewed.   Review of Systems  Psychiatric/Behavioral: Positive for depression, hallucinations, substance abuse and suicidal ideas.  All other systems reviewed and are  negative.   There were no vitals taken for this visit.There is no height or weight on file to calculate BMI.  General Appearance: Fairly Groomed  Eye Contact:  Good  Speech:  Clear and Coherent  Volume:  Normal  Mood:  Depressed  Affect:  Blunt  Thought Process:  Goal Directed and Descriptions of Associations: Tangential  Orientation:  Full (Time, Place, and Person)  Thought Content:  Hallucinations: Auditory Command:  Telling him to kill people.  Suicidal Thoughts:  Yes.  with intent/plan  Homicidal Thoughts:  Yes.  with intent/plan  Memory:  Immediate;   Fair Recent;   Fair Remote;   Fair  Judgement:  Impaired  Insight:  Lacking  Psychomotor Activity:  Normal  Concentration:  Concentration: Fair and Attention Span: Fair  Recall:  [employer]Fair  Fund of Knowledge:  Fair  Language:  Fair  Akathisia:  No  Handed:  Right  AIMS (if indicated):     Assets:  Communication Skills Desire for Improvement Financial Resources/Insurance Housing Physical Health Resilience Social Support  ADL's:  Intact  Cognition:  WNL  Sleep:         COGNITIVE FEATURES THAT CONTRIBUTE TO RISK:  None    SUICIDE RISK:   Moderate:  Frequent suicidal ideation with limited intensity, and duration, some specificity in terms of plans, no associated intent, good self-control, limited dysphoria/symptomatology, some risk factors present, and identifiable protective factors, including available and accessible social support.   PLAN OF CARE: Hospital admission, medication management, substance abuse counseling, discharge planning.  [name] is a 55 year old male with history of schizophrenia and substance use admitted for worsening of psychosis, suicidal or homicidal ideation, and substance use in the context of treatment noncompliance.  1. Suicidal and homicidal ideation. The patient is able to contract for  safety in the hospital.  2. Psychosis. He was restarted on Haldol and Cogentin for psychosis.  3.  Insomnia. Trazodone is available.  4. GERD. He is on Pepcid.  5. Metabolic syndrome monitoring. Lipid profile, TSH, and hemoglobin A1c are pending  6. Hypothyroidism. He is on Synthroid.  7. Substance abuse. The patient denies heavy drinking. We will monitor for symptoms of alcohol withdrawal.  8. Substance abuse treatment. The patient minimizes his problems and declines residential treatment.  9. Disposition. He will be discharged back to his boarding house. He will follow up with [facility].  I certify that inpatient services furnished can reasonably be expected to improve the patient's condition.  Kristine Linea, MD 04/10/2016, 2:47 PM

## 2016-04-10 NOTE — ED Provider Notes (Signed)
-----------------------------------------   7:35 AM on 04/10/2016 -----------------------------------------  Vitals:   04/09/16 2100 04/10/16 0638  BP: 94/62 105/68  Pulse: 78 60  Resp: 18 18  Temp: 98.6 F (37 C) 98.6 F (37 C)   No acute events reported to me overnight. Patient is under IVC and awaiting psychiatric disposition.   Governor Rooksebecca Latika Kronick, MD 04/10/16 [phone]

## 2016-04-10 NOTE — Tx Team (Signed)
Initial Treatment Plan 04/10/2016 3:47 PM Charles CaveGarry L Jemmott UJW:[id number]    PATIENT STRESSORS: Financial difficulties Health problems Substance abuse   PATIENT STRENGTHS: Ability for insight Average or above average intelligence Capable of independent living Communication skills Supportive family/friends   PATIENT IDENTIFIED PROBLEMS: Suicidal  04/10/16  Depression 04/10/16  Substance Abuse  04/10/16  Auditory Hallucination 04/10/16               DISCHARGE CRITERIA:  Ability to meet basic life and health needs Improved stabilization in mood, thinking, and/or behavior Withdrawal symptoms are absent or subacute and managed without 24-hour nursing intervention  PRELIMINARY DISCHARGE PLAN: Outpatient therapy Return to previous living arrangement  PATIENT/FAMILY INVOLVEMENT: This treatment plan has been presented to and reviewed with the patient, Charles Charles, and/or family member,The patient and family have been given the opportunity to ask questions and make suggestions.  Crist InfanteGwen A Fausto Sampedro, RN 04/10/2016, 3:47 PM

## 2016-04-10 NOTE — BHH Suicide Risk Assessment (Signed)
CSW attempted to contact pt's mother, Blair HeysBetty Harvey @ [phone]. Pt's mother did not answer at this time. CSW is awaiting return phone call. Will make another attempt at later time.    Hampton AbbotKadijah Brissia Delisa, MSW, LCSW-A 04/10/2016, 2:31PM

## 2016-04-10 NOTE — H&P (Addendum)
Psychiatric Admission Assessment Adult  Patient Identification: Charles Charles MRN:  [id number] Date of Evaluation:  04/10/2016 Chief Complaint:  Under Commitment Principal Diagnosis: Undifferentiated schizophrenia ([place]) Diagnosis:   Patient Active Problem List   Diagnosis Date Noted  . Undifferentiated schizophrenia ([place]) [F20.3] 04/10/2016  . Alcohol use disorder, moderate, dependence ([place]) [F10.20] 04/10/2016  . GERD (gastroesophageal reflux disease) [K21.9] 04/10/2016  . Tobacco use disorder [F17.200] 04/10/2016  . Suicidal ideation [R45.851] 04/09/2016  . Stimulant abuse [F15.10] 12/14/2014  . Cocaine use disorder, moderate, dependence ([place]) [F14.20] 12/17/2013  . Adult hypothyroidism [E03.9] 12/17/2013  . 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 Blurred vision [H53.8] 12/17/2013   History of Present Illness:   Identifying data. Charles Charles is a 55 year old male with a history of schizophrenia and substance abuse.  Chief complaint. "I hear voices."  History of present illness. Information was obtained from the patient and the chart. The patient has long and well-documented history of schizophrenia. He responds well to Haldol but for the past 7 months he claims not to be compliant. In the past few days he started developing auditory hallucinations commanding him to kill himself and others. He became suicidal and came to the emergency room. He reports some symptoms of depression with poor sleep, decreased appetite and weight loss, anhedonia, feeling of guilt, social isolation, and heightened anxiety. She started drinking beer and took some cocaine prior to admission. He denies heavy alcohol intake or heavy drug use.  Past psychiatric history. There is a long history of schizophrenia with multiple hospitalizations including at [facility]. There is also history of substance use. The patient denies attempting suicide.  Family psychiatric history.  Nonreported.  Social history. He is disabled from mental illness. He lives in an apartment now. He had 7 children. One of them died on 05-11-23 years ago but  It still makes him sad. His elderly mother is his only relatives and support but they have a very difficult relationship and argued frequently. Of note and the patient was seen at [facility] family practice on 9/25 and apparently was prescribed his Haldol and Cogentin there.  Total Time spent with patient: 1 hour  Is the patient at risk to self? Yes.    Has the patient been a risk to self in the past 6 months? No.  Has the patient been a risk to self within the distant past? No.  Is the patient a risk to others? Yes.    Has the patient been a risk to others in the past 6 months? No.  Has the patient been a risk to others within the distant past? No.   Prior Inpatient Therapy:   Prior Outpatient Therapy:    Alcohol Screening:   Substance Abuse History in the last 12 months:  Yes.   Consequences of Substance Abuse: Negative Previous Psychotropic Medications: Yes  Psychological Evaluations: No  Past Medical History:  Past Medical History:  Diagnosis Date  . GERD (gastroesophageal reflux disease)   . Headache   . Schizo-affective schizophrenia [facility])     Past Surgical History:  Procedure Laterality Date  . COLONOSCOPY WITH PROPOFOL N/A 08/04/2015   Procedure: COLONOSCOPY WITH PROPOFOL;  Surgeon: Lucilla Lame, MD;  Location: [facility];  Service: Endoscopy;  Laterality: N/A;  . INCISION AND DRAINAGE PERIRECTAL ABSCESS N/A 07/14/2015   Procedure: IRRIGATION AND DEBRIDEMENT PERIRECTAL ABSCESS;  Surgeon: Florene Glen, MD;  Location: [facility] ORS;  Service: General;  Laterality: N/A;  . INCISION AND DRAINAGE  PERIRECTAL ABSCESS N/A 08/14/2015   Procedure: IRRIGATION AND DEBRIDEMENT PERIRECTAL ABSCESS;  Surgeon: Hubbard Robinson, MD;  Location: [facility] OR[facility];  Service: General;  Laterality: N/A;   Family History:  Family History  Problem  Relation Age of Onset  . Anxiety disorder Mother   . Diabetes Mother   . Diabetes Father     Tobacco Screening:   Social History:  History  Alcohol Use  . 33.6 oz/week  . 11 Cans of beer per week    Comment: 2 x 40's daily - pt says no alcohol for several weeks     History  Drug Use No    Comment: back in the days    Additional Social History: Marital status: Separated Separated, when?: A couple years  What types of issues is patient dealing with in the relationship?: Unknown  What is your sexual orientation?: Straight  Has your sexual activity been affected by drugs, alcohol, medication, or emotional stress?: N/A Does patient have children?: Yes How many children?: 7 How is patient's relationship with their children?: 5 girls, two boys - does not see children very often                          Allergies:   Allergies  Allergen Reactions  . Penicillins Anaphylaxis, Hives and Other (See Comments)    Has patient had a PCN reaction causing immediate rash, facial/tongue/throat swelling, SOB or lightheadedness with hypotension: Yes Has patient had a PCN reaction causing severe rash involving mucus membranes or skin necrosis: No Has patient had a PCN reaction that required hospitalization No Has patient had a PCN reaction occurring within the last 10 years: Yes If all of the above answers are "NO", then may proceed with Cephalosporin use.   Lab Results:  Results for orders placed or performed during the hospital encounter of 04/09/16 (from the past 48 hour(s))  Comprehensive metabolic panel     Status: Abnormal   Collection Time: 04/09/16  6:41 PM  Result Value Ref Range   Sodium 138 135 - 145 mmol/L   Potassium 3.8 3.5 - 5.1 mmol/L   Chloride 104 101 - 111 mmol/L   CO2 22 22 - 32 mmol/L   Glucose, Bld 98 65 - 99 mg/dL   BUN 12 6 - 20 mg/dL   Creatinine, Ser 0.99 0.61 - 1.24 mg/dL   Calcium 9.7 8.9 - 10.3 mg/dL   Total Protein 7.9 6.5 - 8.1 g/dL   Albumin 3.8  3.5 - 5.0 g/dL   AST 23 15 - 41 U/L   ALT 16 (L) 17 - 63 U/L   Alkaline Phosphatase 62 38 - 126 U/L   Total Bilirubin 0.3 0.3 - 1.2 mg/dL   GFR calc non Af Amer >60 >60 mL/min   GFR calc Af Amer >60 >60 mL/min    Comment: (NOTE) The eGFR has been calculated using the CKD EPI equation. This calculation has not been validated in all clinical situations. eGFR's persistently <60 mL/min signify possible Chronic Kidney Disease.    Anion gap 12 5 - 15  Ethanol     Status: Abnormal   Collection Time: 04/09/16  6:41 PM  Result Value Ref Range   Alcohol, Ethyl (B) 61 (H) <5 mg/dL    Comment:        LOWEST DETECTABLE LIMIT FOR SERUM ALCOHOL IS 5 mg/dL FOR MEDICAL PURPOSES ONLY   Salicylate level     Status: None   Collection  Time: 04/09/16  6:41 PM  Result Value Ref Range   Salicylate Lvl <2.8 2.8 - 30.0 mg/dL  Acetaminophen level     Status: Abnormal   Collection Time: 04/09/16  6:41 PM  Result Value Ref Range   Acetaminophen (Tylenol), Serum <10 (L) 10 - 30 ug/mL    Comment:        THERAPEUTIC CONCENTRATIONS VARY SIGNIFICANTLY. A RANGE OF 10-30 ug/mL MAY BE AN EFFECTIVE CONCENTRATION FOR MANY PATIENTS. HOWEVER, SOME ARE BEST TREATED AT CONCENTRATIONS OUTSIDE THIS RANGE. ACETAMINOPHEN CONCENTRATIONS >150 ug/mL AT 4 HOURS AFTER INGESTION AND >50 ug/mL AT 12 HOURS AFTER INGESTION ARE OFTEN ASSOCIATED WITH TOXIC REACTIONS.   cbc     Status: None   Collection Time: 04/09/16  6:41 PM  Result Value Ref Range   WBC 9.2 3.8 - 10.6 K/uL   RBC 4.72 4.40 - 5.90 MIL/uL   Hemoglobin 15.3 13.0 - 18.0 g/dL   HCT 44.5 40.0 - 52.0 %   MCV 94.2 80.0 - 100.0 fL   MCH 32.5 26.0 - 34.0 pg   MCHC 34.5 32.0 - 36.0 g/dL   RDW 13.3 11.5 - 14.5 %   Platelets 342 150 - 440 K/uL  Urine Drug Screen, Qualitative     Status: Abnormal   Collection Time: 04/09/16  6:41 PM  Result Value Ref Range   Tricyclic, Ur Screen NONE DETECTED NONE DETECTED   Amphetamines, Ur Screen NONE DETECTED NONE  DETECTED   MDMA (Ecstasy)Ur Screen NONE DETECTED NONE DETECTED   Cocaine Metabolite,Ur [place] POSITIVE (A) NONE DETECTED   Opiate, Ur Screen NONE DETECTED NONE DETECTED   Phencyclidine (PCP) Ur S NONE DETECTED NONE DETECTED   Cannabinoid 50 Ng, Ur [place] NONE DETECTED NONE DETECTED   Barbiturates, Ur Screen NONE DETECTED NONE DETECTED   Benzodiazepine, Ur Scrn NONE DETECTED NONE DETECTED   Methadone Scn, Ur NONE DETECTED NONE DETECTED    Comment: (NOTE) 786  Tricyclics, urine               Cutoff 1000 ng/mL 200  Amphetamines, urine             Cutoff 1000 ng/mL 300  MDMA (Ecstasy), urine           Cutoff 500 ng/mL 400  Cocaine Metabolite, urine       Cutoff 300 ng/mL 500  Opiate, urine                   Cutoff 300 ng/mL 600  Phencyclidine (PCP), urine      Cutoff 25 ng/mL 700  Cannabinoid, urine              Cutoff 50 ng/mL 800  Barbiturates, urine             Cutoff 200 ng/mL 900  Benzodiazepine, urine           Cutoff 200 ng/mL 1000 Methadone, urine                Cutoff 300 ng/mL 1100 1200 The urine drug screen provides only a preliminary, unconfirmed 1300 analytical test result and should not be used for non-medical 1400 purposes. Clinical consideration and professional judgment should 1500 be applied to any positive drug screen result due to possible 1600 interfering substances. A more specific alternate chemical method 1700 must be used in order to obtain a confirmed analytical result.  1800 Gas chromato graphy / mass spectrometry (GC/MS) is the preferred 1900 confirmatory method.     Blood  Alcohol level:  Lab Results  Component Value Date   ETH 61 (H) 04/09/2016   ETH 133 (H) 09/60/4540    Metabolic Disorder Labs:  Lab Results  Component Value Date   HGBA1C 5.7 01/27/2014   No results found for: PROLACTIN Lab Results  Component Value Date   CHOL 156 01/27/2014   TRIG 121 01/27/2014   HDL 56 01/27/2014   VLDL 24 01/27/2014   LDLCALC 76 01/27/2014    Current  Medications: Current Facility-Administered Medications  Medication Dose Route Frequency Provider Last Rate Last Dose  . acetaminophen (TYLENOL) tablet 650 mg  650 mg Oral Q6H PRN Gonzella Lex, MD      . alum & mag hydroxide-simeth (MAALOX/MYLANTA) 200-200-20 MG/5ML suspension 30 mL  30 mL Oral Q4H PRN Gonzella Lex, MD      . benztropine (COGENTIN) tablet 0.5 mg  0.5 mg Oral BID Gonzella Lex, MD      . famotidine (PEPCID) tablet 20 mg  20 mg Oral BID Gonzella Lex, MD      . haloperidol (HALDOL) tablet 5 mg  5 mg Oral QHS Gonzella Lex, MD      . hydrOXYzine (ATARAX/VISTARIL) tablet 25 mg  25 mg Oral TID PRN Gonzella Lex, MD      . Derrill Memo ON 04/11/2016] levothyroxine (SYNTHROID, LEVOTHROID) tablet 50 mcg  50 mcg Oral QAC breakfast Gonzella Lex, MD      . magnesium hydroxide (MILK OF MAGNESIA) suspension 30 mL  30 mL Oral Daily PRN Gonzella Lex, MD      . traZODone (DESYREL) tablet 50 mg  50 mg Oral QHS Gonzella Lex, MD       PTA Medications: Prescriptions Prior to Admission  Medication Sig Dispense Refill Last Dose  . benztropine (COGENTIN) 1 MG tablet Take 1 tablet (1 mg total) by mouth daily. 30 tablet 2   . famotidine (PEPCID) 20 MG tablet Take 1 tablet (20 mg total) by mouth 2 (two) times daily. 60 tablet 1   . haloperidol (HALDOL) 5 MG tablet Take 1 tablet (5 mg total) by mouth at bedtime. 30 tablet 2   . ibuprofen (ADVIL,MOTRIN) 600 MG tablet Take 1 tablet (600 mg total) by mouth every 6 (six) hours as needed. 30 tablet 0   . Polyethylene Glycol POWD Take 17 g by mouth daily. 1000 g 0   . traZODone (DESYREL) 50 MG tablet Take 1 tablet (50 mg total) by mouth at bedtime. 30 tablet 2     Musculoskeletal: Strength & Muscle Tone: within normal limits Gait & Station: normal Patient leans: N/A  Psychiatric Specialty Exam: I reviewed physical exam performed in the emergency room with the findings. Physical Exam  Nursing note and vitals reviewed.   Review of Systems   Psychiatric/Behavioral: Positive for depression, hallucinations, substance abuse and suicidal ideas.  All other systems reviewed and are negative.   There were no vitals taken for this visit.There is no height or weight on file to calculate BMI.  See SRA.                                                      Treatment Plan Summary: Daily contact with patient to assess and evaluate symptoms and progress in treatment and Medication management   Mr. Prosch is  a 55 year old male with history of schizophrenia and substance use admitted for worsening of psychosis, suicidal or homicidal ideation, and substance use in the context of treatment noncompliance.  1. Suicidal and homicidal ideation. The patient is able to contract for safety in the hospital.  2. Psychosis. He was restarted on Haldol and Cogentin for psychosis.  3. Insomnia. Trazodone is available.  4. GERD. He is on Pepcid.  5. Metabolic syndrome monitoring. Lipid profile, TSH, and hemoglobin A1c are pending  6. Hypothyroidism. He is on Synthroid.  7. Substance abuse. The patient denies heavy drinking. We will monitor for symptoms of alcohol withdrawal.  8. Substance abuse treatment. The patient minimizes his problems and declines residential treatment.  9. Disposition. He will be discharged back to his boarding house. He will follow up with [facility].   Observation Level/Precautions:  15 minute checks  Laboratory:  CBC Chemistry Profile UDS UA  Psychotherapy:    Medications:    Consultations:    Discharge Concerns:    Estimated LOS:  Other:     Physician Treatment Plan for Primary Diagnosis: Undifferentiated schizophrenia ([place]) Long Term Goal(s): Improvement in symptoms so as ready for discharge  Short Term Goals: Ability to identify changes in lifestyle to reduce recurrence of condition will improve, Ability to verbalize feelings will improve, Ability to disclose and discuss suicidal ideas, Ability  to demonstrate self-control will improve, Ability to identify and develop effective coping behaviors will improve, Ability to maintain clinical measurements within normal limits will improve and Compliance with prescribed medications will improve  Physician Treatment Plan for Secondary Diagnosis: Principal Problem:   Undifferentiated schizophrenia ([place]) Active Problems:   Cocaine use disorder, moderate, dependence ([place])   Adult hypothyroidism   Suicidal ideation   Alcohol use disorder, moderate, dependence ([place])   GERD (gastroesophageal reflux disease)   Tobacco use disorder  Long Term Goal(s): Improvement in symptoms so as ready for discharge  Short Term Goals: Ability to identify changes in lifestyle to reduce recurrence of condition will improve, Ability to demonstrate self-control will improve and Ability to identify triggers associated with substance abuse/mental health issues will improve  I certify that inpatient services furnished can reasonably be expected to improve the patient's condition.    Orson Slick, MD 10/11/20172:53 PM

## 2016-04-11 LAB — LIPID PANEL
CHOL/HDL RATIO: 2.6 ratio
CHOLESTEROL: 162 mg/dL (ref 0–200)
HDL: 62 mg/dL (ref >40)
LDL CALC: 85 mg/dL (ref 0–99)
Triglycerides: 75 mg/dL (ref <150)
VLDL: 15 mg/dL (ref 0–40)

## 2016-04-11 LAB — TSH: TSH: 3.971 u[IU]/mL (ref 0.350–4.500)

## 2016-04-11 NOTE — Progress Notes (Signed)
D: Patient has not attended unit programing this shift . Patient remained isolated to room  Patient out only for  Meals .  Patient stated slept fair last night .Stated appetite fair and energy level  Low . Stated concentration poor . Stated on Depression scale  8, hopeless 8 and anxiety 8 .( low 0-10 high) Denies suicidal  homicidal ideations  .  No auditory hallucinations  No pain concerns . Appropriate ADL'S. Interacting with peers and staff. Voice  Of continue work on coping skills  A: Encourage patient participation with unit programming . Instruction  Given on  Medication , verbalize understanding. R: Voice no other concerns. Staff continue to monitor

## 2016-04-11 NOTE — Progress Notes (Signed)
Recreation Therapy Notes  Date: 10.12.17 Time: 9:30 am Location: Craft Room  Group Topic: Leisure Education  Goal Area(s) Addresses:  Patient will identify activities for each letter of the alphabet. Patient will verbalize ability to integrate positive leisure into life post d/c. Patient will verbalize ability to use leisure as a coping skill.  Behavioral Response: Did not attend  Intervention: Leisure Alphabet  Activity: Patients were given a Leisure Alphabet worksheet and instructed to write healthy leisure activities for each letter of the alphabet.  Education: LRT educated patients on ways they can participate in leisure.  Education Outcome: Patient did not attend group.  Clinical Observations/Feedback: Patient did not attend group.  Cordia Miklos M, LRT/CTRS 04/11/2016 10:20 AM 

## 2016-04-11 NOTE — Progress Notes (Signed)
D: Observed pt in room sleeping in bed. Patient alert and oriented x4. Patient endorses passive, but verbally contracts for safety. Pt endorses AH telling pt to "hurt myself." Pt denies HI/VH. Pt affect is flat. Pt stated indicated he stayed in be most of the day stating, "I just don't want to be around people." Pt isolated to room and forwarded little this evening. Pt rated depression 10/10 and anxiety 8/10. A: Offered active listening and support. Provided therapeutic communication. Administered scheduled medications. Encouraged pt to attend groups and actively participate  R: Pt pleasant and cooperative. Pt medication compliant. Will continue Q15 min. checks. Safety maintained.

## 2016-04-11 NOTE — Tx Team (Signed)
Interdisciplinary Treatment and Diagnostic Plan Update  04/11/2016 Time of Session: 11:30am Charles Charles MRN: [id number]  Principal Diagnosis: Undifferentiated schizophrenia [facility])  Secondary Diagnoses: Principal Problem:   Undifferentiated schizophrenia (HCC) Active Problems:   Cocaine use disorder, moderate, dependence (HCC)   Adult hypothyroidism   Suicidal ideation   Alcohol use disorder, moderate, dependence (HCC)   GERD (gastroesophageal reflux disease)   Tobacco use disorder   Current Medications:  Current Facility-Administered Medications  Medication Dose Route Frequency Provider Last Rate Last Dose  . acetaminophen (TYLENOL) tablet 650 mg  650 mg Oral Q6H PRN Audery Amel, MD      . alum & mag hydroxide-simeth (MAALOX/MYLANTA) 200-200-20 MG/5ML suspension 30 mL  30 mL Oral Q4H PRN Audery Amel, MD      . benztropine (COGENTIN) tablet 0.5 mg  0.5 mg Oral BID Audery Amel, MD   0.5 mg at 04/11/16 4098  . famotidine (PEPCID) tablet 20 mg  20 mg Oral BID Audery Amel, MD   20 mg at 04/11/16 1191  . haloperidol (HALDOL) tablet 5 mg  5 mg Oral QHS Audery Amel, MD   5 mg at 04/10/16 2158  . hydrOXYzine (ATARAX/VISTARIL) tablet 25 mg  25 mg Oral TID PRN Audery Amel, MD      . levothyroxine (SYNTHROID, LEVOTHROID) tablet 50 mcg  50 mcg Oral QAC breakfast Audery Amel, MD   50 mcg at 04/11/16 [phone]  . magnesium hydroxide (MILK OF MAGNESIA) suspension 30 mL  30 mL Oral Daily PRN Audery Amel, MD      . traZODone (DESYREL) tablet 50 mg  50 mg Oral QHS Audery Amel, MD   50 mg at 04/10/16 2158   PTA Medications: Prescriptions Prior to Admission  Medication Sig Dispense Refill Last Dose  . benztropine (COGENTIN) 1 MG tablet Take 1 tablet (1 mg total) by mouth daily. 30 tablet 2   . famotidine (PEPCID) 20 MG tablet Take 1 tablet (20 mg total) by mouth 2 (two) times daily. 60 tablet 1   . haloperidol (HALDOL) 5 MG tablet Take 1 tablet (5 mg total) by mouth at bedtime.  30 tablet 2   . ibuprofen (ADVIL,MOTRIN) 600 MG tablet Take 1 tablet (600 mg total) by mouth every 6 (six) hours as needed. 30 tablet 0   . Polyethylene Glycol POWD Take 17 g by mouth daily. 1000 g 0   . traZODone (DESYREL) 50 MG tablet Take 1 tablet (50 mg total) by mouth at bedtime. 30 tablet 2     Patient Stressors: Financial difficulties Health problems Substance abuse  Patient Strengths: Ability for insight Average or above average intelligence Capable of independent living Communication skills Supportive family/friends  Treatment Modalities: Medication Management, Group therapy, Case management,  1 to 1 session with clinician, Psychoeducation, Recreational therapy.   Physician Treatment Plan for Primary Diagnosis: Undifferentiated schizophrenia (HCC) Long Term Goal(s): Improvement in symptoms so as ready for discharge Improvement in symptoms so as ready for discharge   Short Term Goals: Ability to identify changes in lifestyle to reduce recurrence of condition will improve Ability to verbalize feelings will improve Ability to disclose and discuss suicidal ideas Ability to demonstrate self-control will improve Ability to identify and develop effective coping behaviors will improve Ability to maintain clinical measurements within normal limits will improve Compliance with prescribed medications will improve Ability to identify changes in lifestyle to reduce recurrence of condition will improve Ability to demonstrate self-control will improve Ability  to identify triggers associated with substance abuse/mental health issues will improve  Medication Management: Evaluate patient's response, side effects, and tolerance of medication regimen.  Therapeutic Interventions: 1 to 1 sessions, Unit Group sessions and Medication administration.  Evaluation of Outcomes: Progressing  Physician Treatment Plan for Secondary Diagnosis: Principal Problem:   Undifferentiated schizophrenia  (HCC) Active Problems:   Cocaine use disorder, moderate, dependence (HCC)   Adult hypothyroidism   Suicidal ideation   Alcohol use disorder, moderate, dependence (HCC)   GERD (gastroesophageal reflux disease)   Tobacco use disorder  Long Term Goal(s): Improvement in symptoms so as ready for discharge Improvement in symptoms so as ready for discharge   Short Term Goals: Ability to identify changes in lifestyle to reduce recurrence of condition will improve Ability to verbalize feelings will improve Ability to disclose and discuss suicidal ideas Ability to demonstrate self-control will improve Ability to identify and develop effective coping behaviors will improve Ability to maintain clinical measurements within normal limits will improve Compliance with prescribed medications will improve Ability to identify changes in lifestyle to reduce recurrence of condition will improve Ability to demonstrate self-control will improve Ability to identify triggers associated with substance abuse/mental health issues will improve     Medication Management: Evaluate patient's response, side effects, and tolerance of medication regimen.  Therapeutic Interventions: 1 to 1 sessions, Unit Group sessions and Medication administration.  Evaluation of Outcomes: Progressing   RN Treatment Plan for Primary Diagnosis: Undifferentiated schizophrenia (HCC) Long Term Goal(s): Knowledge of disease and therapeutic regimen to maintain health will improve  Short Term Goals: Ability to verbalize feelings will improve, Ability to disclose and discuss suicidal ideas, Ability to identify and develop effective coping behaviors will improve and Compliance with prescribed medications will improve  Medication Management: RN will administer medications as ordered by provider, will assess and evaluate patient's response and provide education to patient for prescribed medication. RN will report any adverse and/or side  effects to prescribing provider.  Therapeutic Interventions: 1 on 1 counseling sessions, Psychoeducation, Medication administration, Evaluate responses to treatment, Monitor vital signs and CBGs as ordered, Perform/monitor CIWA, COWS, AIMS and Fall Risk screenings as ordered, Perform wound care treatments as ordered.  Evaluation of Outcomes: Progressing   LCSW Treatment Plan for Primary Diagnosis: Undifferentiated schizophrenia (HCC) Long Term Goal(s): Safe transition to appropriate next level of care at discharge, Engage patient in therapeutic group addressing interpersonal concerns.  Short Term Goals: Engage patient in aftercare planning with referrals and resources, Increase social support, Increase ability to appropriately verbalize feelings, Increase emotional regulation, Facilitate acceptance of mental health diagnosis and concerns, Facilitate patient progression through stages of change regarding substance use diagnoses and concerns, Identify triggers associated with mental health/substance abuse issues and Increase skills for wellness and recovery  Therapeutic Interventions: Assess for all discharge needs, 1 to 1 time with Social worker, Explore available resources and support systems, Assess for adequacy in community support network, Educate family and significant other(s) on suicide prevention, Complete Psychosocial Assessment, Interpersonal group therapy.  Evaluation of Outcomes: Progressing   Progress in Treatment: Attending groups: CSW still assessing, pt new to milieu Participating in groups: CSW still assessing, pt new to milieu Taking medication as prescribed: Yes. Toleration medication: Yes. Family/Significant other contact made: No, will contact:  pt's family Patient understands diagnosis: Yes. Discussing patient identified problems/goals with staff: Yes. Medical problems stabilized or resolved: Yes. Denies suicidal/homicidal ideation: Yes. Issues/concerns per patient  self-inventory: No. Other: None  New problem(s) identified: No, Describe:  none  listed  New Short Term/Long Term Goal(s):  Discharge Plan or Barriers: CSW still assessing for appropriate referrals   Reason for Continuation of Hospitalization: Anxiety Delusions  Suicidal ideation Withdrawal symptoms  Estimated Length of Stay: 3-5 days  Attendees: Patient: Charles Charles  04/11/2016 10:58 AM  Physician: Kristine LineaJolanta [name], MD 04/11/2016 10:58 AM  Nursing: Elenore PaddyJennifer Morrow, RN 04/11/2016 10:58 AM  RN Care Manager: 04/11/2016 10:58 AM  Social Worker: York GriceJonathan Eldon Zietlow, MSW, LCSW-A 04/11/2016 10:58 AM  Recreational Therapist:                                        04/11/2016  11:37AM Beth Neva SeatGreene, LRT, CTRS   Scribe for Treatment Team: Dorothe PeaJonathan F Terreon Ekholm, LCSWA 04/11/2016 11:27 AM

## 2016-04-11 NOTE — Plan of Care (Signed)
Problem: Education: Goal: Knowledge of [facility] Education information/materials will improve Outcome: Progressing Verbalized understanding.     

## 2016-04-11 NOTE — Progress Notes (Signed)
[facility] MD Progress Note  04/11/2016 12:10 PM Charles Charles  MRN:  [id number]  Subjective:  Charles Charles is a 54 year old male with a history of schizophrenia and substance abuse admitted for worsening of psychosis with auditory hallucinations, depression, and suicidal ideation in the context of medication noncompliance and relapse on cocaine. He was restarted on Haldol and Cogentin that has been helpful in the past.  Today the patient met with his treatment team. He reports some improvement but still feels depressed, suicidal, and psychotic. He slept last night and feels rested today. He is still secluded to his room and does not participate in programming. He was able to discuss discharge plan. He agrees to a follow-up with intensive outpatient substance abuse treatment program. There are no somatic complaints. He tolerates medications well.  Principal Problem: Undifferentiated schizophrenia ([place]) Diagnosis:   Patient Active Problem List   Diagnosis Date Noted  . Undifferentiated schizophrenia ([place]) [F20.3] 04/10/2016  . Alcohol use disorder, moderate, dependence ([place]) [F10.20] 04/10/2016  . GERD (gastroesophageal reflux disease) [K21.9] 04/10/2016  . Tobacco use disorder [F17.200] 04/10/2016  . Suicidal ideation [R45.851] 04/09/2016  . Stimulant abuse [F15.10] 12/14/2014  . Cocaine use disorder, moderate, dependence ([place]) [F14.20] 12/17/2013  . Adult hypothyroidism [E03.9] 12/17/2013  . 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 Blurred vision [H53.8] 12/17/2013   Total Time spent with patient: 20 minutes  Past Psychiatric History: Schizophrenia and substance use.  Past Medical History:  Past Medical History:  Diagnosis Date  . GERD (gastroesophageal reflux disease)   . Headache   . Schizo-affective schizophrenia [facility])     Past Surgical History:  Procedure Laterality Date  . COLONOSCOPY WITH PROPOFOL N/A 08/04/2015   Procedure: COLONOSCOPY WITH PROPOFOL;  Surgeon: Lucilla Lame, MD;  Location: [place];  Service: Endoscopy;  Laterality: N/A;  . INCISION AND DRAINAGE PERIRECTAL ABSCESS N/A 07/14/2015   Procedure: IRRIGATION AND DEBRIDEMENT PERIRECTAL ABSCESS;  Surgeon: Florene Glen, MD;  Location: [facility];  Service: General;  Laterality: N/A;  . INCISION AND DRAINAGE PERIRECTAL ABSCESS N/A 08/14/2015   Procedure: IRRIGATION AND DEBRIDEMENT PERIRECTAL ABSCESS;  Surgeon: Hubbard Robinson, MD;  Location: [facility];  Service: General;  Laterality: N/A;   Family History:  Family History  Problem Relation Age of Onset  . Anxiety disorder Mother   . Diabetes Mother   . Diabetes Father    Family Psychiatric  History: See H&P. Social History:  History  Alcohol Use  . 33.6 oz/week  . 54 Cans of beer per week    Comment: 2 x 40's daily - pt says no alcohol for several weeks     History  Drug Use No    Comment: back in the days    Social History   Social History  . Marital status: Single    Spouse name: N/A  . Number of children: N/A  . Years of education: N/A   Social History Main Topics  . Smoking status: Current Every Day Smoker    Packs/day: 0.10    Types: Cigarettes  . Smokeless tobacco: Never Used     Comment: (1-2 cigs/day)  . Alcohol use 33.6 oz/week    56 Cans of beer per week     Comment: 2 x 40's daily - pt says no alcohol for several weeks  . Drug use: No     Comment: back in the days  . Sexual activity: Not Asked   Other Topics Concern  . None   Social  History Narrative  . None   Additional Social History:                         Sleep: Fair  Appetite:  Poor  Current Medications: Current Facility-Administered Medications  Medication Dose Route Frequency Provider Last Rate Last Dose  . acetaminophen (TYLENOL) tablet 650 mg  650 mg Oral Q6H PRN Gonzella Lex, MD      . alum & mag hydroxide-simeth (MAALOX/MYLANTA) 200-200-20 MG/5ML suspension 30 mL  30 mL Oral Q4H PRN Gonzella Lex, MD      .  benztropine (COGENTIN) tablet 0.5 mg  0.5 mg Oral BID Gonzella Lex, MD   0.5 mg at 04/11/16 5621  . famotidine (PEPCID) tablet 20 mg  20 mg Oral BID Gonzella Lex, MD   20 mg at 04/11/16 3086  . haloperidol (HALDOL) tablet 5 mg  5 mg Oral QHS Gonzella Lex, MD   5 mg at 04/10/16 2158  . hydrOXYzine (ATARAX/VISTARIL) tablet 25 mg  25 mg Oral TID PRN Gonzella Lex, MD      . levothyroxine (SYNTHROID, LEVOTHROID) tablet 50 mcg  50 mcg Oral QAC breakfast Gonzella Lex, MD   50 mcg at 04/11/16 [phone]  . magnesium hydroxide (MILK OF MAGNESIA) suspension 30 mL  30 mL Oral Daily PRN Gonzella Lex, MD      . traZODone (DESYREL) tablet 50 mg  50 mg Oral QHS Gonzella Lex, MD   50 mg at 04/10/16 2158    Lab Results:  Results for orders placed or performed during the hospital encounter of 04/10/16 (from the past 48 hour(s))  Lipid panel     Status: None   Collection Time: 04/11/16  6:53 AM  Result Value Ref Range   Cholesterol 162 0 - 200 mg/dL   Triglycerides 75 <150 mg/dL   HDL 62 >40 mg/dL   Total CHOL/HDL Ratio 2.6 RATIO   VLDL 15 0 - 40 mg/dL   LDL Cholesterol 85 0 - 99 mg/dL    Comment:        Total Cholesterol/HDL:CHD Risk Coronary Heart Disease Risk Table                     Men   Women  1/2 Average Risk   3.4   3.3  Average Risk       5.0   4.4  2 X Average Risk   9.6   7.1  3 X Average Risk  23.4   11.0        Use the calculated Patient Ratio above and the CHD Risk Table to determine the patient's CHD Risk.        ATP III CLASSIFICATION (LDL):  <100     mg/dL   Optimal  100-129  mg/dL   Near or Above                    Optimal  130-159  mg/dL   Borderline  160-189  mg/dL   High  >190     mg/dL   Very High   TSH     Status: None   Collection Time: 04/11/16  6:53 AM  Result Value Ref Range   TSH 3.971 0.350 - 4.500 uIU/mL    Comment: Performed by a 3rd Generation assay with a functional sensitivity of <=0.01 uIU/mL.    Blood Alcohol level:  Lab Results  Component  Value Date   ETH 61 (H) 04/09/2016   ETH 133 (H) 88/91/6945    Metabolic Disorder Labs: Lab Results  Component Value Date   HGBA1C 5.7 01/27/2014   No results found for: PROLACTIN Lab Results  Component Value Date   CHOL 162 04/11/2016   TRIG 75 04/11/2016   HDL 62 04/11/2016   CHOLHDL 2.6 04/11/2016   VLDL 15 04/11/2016   LDLCALC 85 04/11/2016   LDLCALC 76 01/27/2014    Physical Findings: AIMS:  , ,  ,  ,    CIWA:    COWS:     Musculoskeletal: Strength & Muscle Tone: within normal limits Gait & Station: normal Patient leans: N/A  Psychiatric Specialty Exam: Physical Exam  Nursing note and vitals reviewed.   Review of Systems  Psychiatric/Behavioral: Positive for depression, hallucinations, substance abuse and suicidal ideas.  All other systems reviewed and are negative.   Blood pressure 100/60, pulse (!) 59, temperature 98 F (36.7 C), resp. rate 18, height _0  (1.6 m), weight 63 kg (139 lb), SpO2 100 %.Body mass index is 24.62 kg/m.  General Appearance: Casual  Eye Contact:  Good  Speech:  Clear and Coherent  Volume:  Normal  Mood:  Anxious, Hopeless and Worthless  Affect:  Blunt  Thought Process:  Goal Directed and Descriptions of Associations: Intact  Orientation:  Full (Time, Place, and Person)  Thought Content:  Hallucinations: Auditory  Suicidal Thoughts:  Yes.  with intent/plan  Homicidal Thoughts:  No  Memory:  Immediate;   Fair Recent;   Fair Remote;   Fair  Judgement:  Poor  Insight:  Lacking  Psychomotor Activity:  Psychomotor Retardation  Concentration:  Concentration: Fair and Attention Span: Fair  Recall:  [employer] of Knowledge:  Fair  Language:  Fair  Akathisia:  No  Handed:  Right  AIMS (if indicated):     Assets:  Communication Skills Desire for Improvement Financial Resources/Insurance Housing Physical Health Resilience Social Support  ADL's:  Intact  Cognition:  WNL  Sleep:  Number of Hours: 7.5     Treatment Plan  Summary: Daily contact with patient to assess and evaluate symptoms and progress in treatment and Medication management   Charles Charles is a 55 year old male with history of schizophrenia and substance use admitted for worsening of psychosis, suicidal or homicidal ideation, and substance use in the context of treatment noncompliance.  1. Suicidal and homicidal ideation. The patient is able to contract for safety in the hospital.  2. Psychosis. He was restarted on Haldol and Cogentin for psychosis.  3. Insomnia. Trazodone is available.  4. GERD. He is on Pepcid.  5. Metabolic syndrome monitoring. Lipid profile, TSH, and hemoglobin A1c are normal.   6. EKG. Normal EKG.   7. Hypothyroidism. He is on Synthroid.  8. Substance abuse. The patient denies heavy drinking. We will monitor for symptoms of alcohol withdrawal.  9. Substance abuse treatment. The patient minimizes his problems and declines residential treatment but is interested in [facility] IOP.  10. Weight loss. The patient requested double portions.   11. Disposition. He will be discharged back to his boarding house. He will follow up with [facility].  Orson Slick, MD 04/11/2016, 12:10 PM

## 2016-04-11 NOTE — [facility] Group Notes (Signed)
[facility] Group Notes:  (Nursing/MHT/Case Management/Adjunct)  Date:  04/11/2016  Time:  11:25 PM  Type of Therapy:  Psychoeducational Skills  Participation Level:  Did Not Attend  Participation Quality  Summary of Progress/Problems:  Mayra NeerJackie L Mendi Constable 04/11/2016, 11:25 PM

## 2016-04-11 NOTE — [facility] Group Notes (Signed)
[facility] LCSW Group Therapy     04/11/2016 1 pm   Type of Therapy: Group Therapy     Participation Level: Did Not Attend. Patient invited to participate but declined.    Willie Plain LCSW  

## 2016-04-11 NOTE — Plan of Care (Signed)
Problem: Activity: Goal: Interest or engagement in leisure activities will improve Outcome: Not Progressing Pt isolated to room all evening.    

## 2016-04-12 LAB — HEMOGLOBIN A1C
HEMOGLOBIN A1C: 5.5 % (ref 4.8–5.6)
MEAN PLASMA GLUCOSE: 111 mg/dL

## 2016-04-12 MED ORDER — HALOPERIDOL DECANOATE 100 MG/ML IM SOLN
50.0000 mg | INTRAMUSCULAR | Status: DC
  Filled 2016-04-12: qty 0.5

## 2016-04-12 NOTE — Plan of Care (Signed)
Problem: Safety: Goal: Ability to remain free from injury will improve Outcome: Progressing Pt remained free from injury   

## 2016-04-12 NOTE — Progress Notes (Addendum)
[facility] MD Progress Note  04/12/2016 12:05 PM TRICE ASPINALL  MRN:  [id number]  Subjective:  Mr. Manning is a 55 year old male with a history of schizophrenia and substance abuse admitted for worsening of psychosis with auditory hallucinations, depression, and suicidal ideation in the context of medication noncompliance and relapse on cocaine. He was restarted on Haldol and Cogentin that has been helpful in the past.  Today the patient reports mild improvement but still feels depressed, suicidal, and psychotic. He has improved. He asked about portions and his appetite is improving. He lost weight before coming to the hospital. He is still secluded to his room with his head covered. He does not participate in programming. He is disappointed today as he will not be able to live with his mother. She is an elderly and they do not have a good relationship. He agrees to a follow-up with intensive outpatient substance abuse treatment program. There are no somatic complaints. He tolerates medications well.  Principal Problem: Undifferentiated schizophrenia (HCC) Diagnosis:   Patient Active Problem List   Diagnosis Date Noted  . Undifferentiated schizophrenia (HCC) [F20.3] 04/10/2016  . Alcohol use disorder, moderate, dependence (HCC) [F10.20] 04/10/2016  . GERD (gastroesophageal reflux disease) [K21.9] 04/10/2016  . Tobacco use disorder [F17.200] 04/10/2016  . Suicidal ideation [R45.851] 04/09/2016  . Stimulant abuse [F15.10] 12/14/2014  . Cocaine use disorder, moderate, dependence (HCC) [F14.20] 12/17/2013  . Adult hypothyroidism [E03.9] 12/17/2013  . 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 Blurred vision [H53.8] 12/17/2013   Total Time spent with patient: 20 minutes  Past Psychiatric History: Schizophrenia and substance use.  Past Medical History:  Past Medical History:  Diagnosis Date  . GERD (gastroesophageal reflux disease)   . Headache   . Schizo-affective schizophrenia [facility])      Past Surgical History:  Procedure Laterality Date  . COLONOSCOPY WITH PROPOFOL N/A 08/04/2015   Procedure: COLONOSCOPY WITH PROPOFOL;  Surgeon: Midge Minium, MD;  Location: [facility];  Service: Endoscopy;  Laterality: N/A;  . INCISION AND DRAINAGE PERIRECTAL ABSCESS N/A 07/14/2015   Procedure: IRRIGATION AND DEBRIDEMENT PERIRECTAL ABSCESS;  Surgeon: Lattie Haw, MD;  Location: [facility];  Service: General;  Laterality: N/A;  . INCISION AND DRAINAGE PERIRECTAL ABSCESS N/A 08/14/2015   Procedure: IRRIGATION AND DEBRIDEMENT PERIRECTAL ABSCESS;  Surgeon: Gladis Riffle, MD;  Location: [facility];  Service: General;  Laterality: N/A;   Family History:  Family History  Problem Relation Age of Onset  . Anxiety disorder Mother   . Diabetes Mother   . Diabetes Father    Family Psychiatric  History: See H&P. Social History:  History  Alcohol Use  . 33.6 oz/week  . 56 Cans of beer per week    Comment: 2 x 40's daily - pt says no alcohol for several weeks     History  Drug Use No    Comment: back in the days    Social History   Social History  . Marital status: Single    Spouse name: N/A  . Number of children: N/A  . Years of education: N/A   Social History Main Topics  . Smoking status: Current Every Day Smoker    Packs/day: 0.10    Types: Cigarettes  . Smokeless tobacco: Never Used     Comment: (1-2 cigs/day)  . Alcohol use 33.6 oz/week    56 Cans of beer per week     Comment: 2 x 40's daily - pt says no alcohol for several weeks  .  Drug use: No     Comment: back in the days  . Sexual activity: Not Asked   Other Topics Concern  . None   Social History Narrative  . None   Additional Social History:                         Sleep: Fair  Appetite:  Poor  Current Medications: Current Facility-Administered Medications  Medication Dose Route Frequency Provider Last Rate Last Dose  . acetaminophen (TYLENOL) tablet 650 mg  650 mg Oral Q6H PRN  Audery Amel, MD      . alum & mag hydroxide-simeth (MAALOX/MYLANTA) 200-200-20 MG/5ML suspension 30 mL  30 mL Oral Q4H PRN Audery Amel, MD      . benztropine (COGENTIN) tablet 0.5 mg  0.5 mg Oral BID Audery Amel, MD   0.5 mg at 04/12/16 0830  . famotidine (PEPCID) tablet 20 mg  20 mg Oral BID Audery Amel, MD   20 mg at 04/12/16 0830  . haloperidol (HALDOL) tablet 5 mg  5 mg Oral QHS Audery Amel, MD   5 mg at 04/11/16 2249  . hydrOXYzine (ATARAX/VISTARIL) tablet 25 mg  25 mg Oral TID PRN Audery Amel, MD      . levothyroxine (SYNTHROID, LEVOTHROID) tablet 50 mcg  50 mcg Oral QAC breakfast Audery Amel, MD   50 mcg at 04/12/16 0641  . magnesium hydroxide (MILK OF MAGNESIA) suspension 30 mL  30 mL Oral Daily PRN Audery Amel, MD      . traZODone (DESYREL) tablet 50 mg  50 mg Oral QHS Audery Amel, MD   50 mg at 04/11/16 2249    Lab Results:  Results for orders placed or performed during the hospital encounter of 04/10/16 (from the past 48 hour(s))  Hemoglobin A1c     Status: None   Collection Time: 04/11/16  6:53 AM  Result Value Ref Range   Hgb A1c MFr Bld 5.5 4.8 - 5.6 %    Comment: (NOTE)         Pre-diabetes: 5.7 - 6.4         Diabetes: >6.4         Glycemic control for adults with diabetes: <7.0    Mean Plasma Glucose 111 mg/dL    Comment: (NOTE) Performed At: [facility] [address] [place], [place] [id number] Mila Homer MD WU:[id number]   Lipid panel     Status: None   Collection Time: 04/11/16  6:53 AM  Result Value Ref Range   Cholesterol 162 0 - 200 mg/dL   Triglycerides 75 <956 mg/dL   HDL 62 >21 mg/dL   Total CHOL/HDL Ratio 2.6 RATIO   VLDL 15 0 - 40 mg/dL   LDL Cholesterol 85 0 - 99 mg/dL    Comment:        Total Cholesterol/HDL:CHD Risk Coronary Heart Disease Risk Table                     Men   Women  1/2 Average Risk   3.4   3.3  Average Risk       5.0   4.4  2 X Average Risk   9.6   7.1  3 X Average Risk  23.4   11.0         Use the calculated Patient Ratio above and the CHD Risk Table to determine the patient's CHD  Risk.        ATP III CLASSIFICATION (LDL):  <100     mg/dL   Optimal  161-096  mg/dL   Near or Above                    Optimal  130-159  mg/dL   Borderline  045-409  mg/dL   High  >811     mg/dL   Very High   TSH     Status: None   Collection Time: 04/11/16  6:53 AM  Result Value Ref Range   TSH 3.971 0.350 - 4.500 uIU/mL    Comment: Performed by a 3rd Generation assay with a functional sensitivity of <=0.01 uIU/mL.    Blood Alcohol level:  Lab Results  Component Value Date   ETH 61 (H) 04/09/2016   ETH 133 (H) 12/23/2014    Metabolic Disorder Labs: Lab Results  Component Value Date   HGBA1C 5.5 04/11/2016   MPG 111 04/11/2016   No results found for: PROLACTIN Lab Results  Component Value Date   CHOL 162 04/11/2016   TRIG 75 04/11/2016   HDL 62 04/11/2016   CHOLHDL 2.6 04/11/2016   VLDL 15 04/11/2016   LDLCALC 85 04/11/2016   LDLCALC 76 01/27/2014    Physical Findings: AIMS:  , ,  ,  ,    CIWA:    COWS:     Musculoskeletal: Strength & Muscle Tone: within normal limits Gait & Station: normal Patient leans: N/A  Psychiatric Specialty Exam: Physical Exam  Nursing note and vitals reviewed.   Review of Systems  Psychiatric/Behavioral: Positive for depression, hallucinations, substance abuse and suicidal ideas.  All other systems reviewed and are negative.   Blood pressure 98/62, pulse 66, temperature 98 F (36.7 C), temperature source Oral, resp. rate 18, height 5\' 3"  (1.6 m), weight 63 kg (139 lb), SpO2 100 %.Body mass index is 24.62 kg/m.  General Appearance: Casual  Eye Contact:  Good  Speech:  Clear and Coherent  Volume:  Normal  Mood:  Anxious, Hopeless and Worthless  Affect:  Blunt  Thought Process:  Goal Directed and Descriptions of Associations: Intact  Orientation:  Full (Time, Place, and Person)  Thought Content:  Hallucinations: Auditory   Suicidal Thoughts:  Yes.  with intent/plan  Homicidal Thoughts:  No  Memory:  Immediate;   Fair Recent;   Fair Remote;   Fair  Judgement:  Poor  Insight:  Lacking  Psychomotor Activity:  Psychomotor Retardation  Concentration:  Concentration: Fair and Attention Span: Fair  Recall:  [employer] of Knowledge:  Fair  Language:  Fair  Akathisia:  No  Handed:  Right  AIMS (if indicated):     Assets:  Communication Skills Desire for Improvement Financial Resources/Insurance Housing Physical Health Resilience Social Support  ADL's:  Intact  Cognition:  WNL  Sleep:  Number of Hours: 7.75     Treatment Plan Summary: Daily contact with patient to assess and evaluate symptoms and progress in treatment and Medication management   Mr. Villada is a 55 year old male with history of schizophrenia and substance use admitted for worsening of psychosis, suicidal or homicidal ideation, and substance use in the context of treatment noncompliance.  1. Suicidal and homicidal ideation. The patient is able to contract for safety in the hospital.  2. Psychosis. He was restarted on Haldol and Cogentin for psychosis. We offered Haldol decanoate injection to improve compliance but the patient refused.  3. Insomnia. Trazodone is available.  4. GERD. He is on Pepcid.  5. Metabolic syndrome monitoring. Lipid profile, TSH, and hemoglobin A1c are normal.   6. EKG. Normal EKG.   7. Hypothyroidism. He is on Synthroid.  8. Substance abuse. The patient denies heavy drinking. We will monitor for symptoms of alcohol withdrawal.  9. Substance abuse treatment. The patient minimizes his problems and declines residential treatment but is interested in SA IOP.  10. Weight loss. The patient requested double portions.   11. Disposition. He will be discharged back to his boarding house. He will follow up with [facility].  Kristine LineaJolanta Rusty Glodowski, MD 04/12/2016, 12:05 PM

## 2016-04-12 NOTE — Progress Notes (Signed)
D: Observed pt in room sleeping in bed. Patient alert and oriented x4. Patient denies SI but. Pt endorses AH, but pt stated "I can't make it out."  Pt denies HI/VH. Pt affect is flat. Pt stated indicated he stayed in be most of the day stating, "I'm scared something might happen." Pt isolated to room and forwarded little this evening. Pt rated depression 8/10 and anxiety 7/10. A: Offered active listening and support. Provided therapeutic communication. Administered scheduled medications. Encouraged pt to attend groups and actively participate. Informed pt that if he felt thoughts of harm towards others, to talk to staff. R: Pt pleasant and cooperative. Pt indicated he would reach out to staff if he felt thoughts of harm to others. Pt medication compliant. Will continue Q15 min. checks. Safety maintained.

## 2016-04-12 NOTE — [facility] Group Notes (Signed)
[facility] LCSW Group Therapy  04/12/2016 1pm Type of Therapy: Group Therapy Participation Level: Active  Participation Quality: Attentive, Sharing and Supportive  Affect: Appropriate  Cognitive: Alert and Oriented  Insight: Developing/Improving and Engaged  Engagement in Therapy: Developing/Improving and Engaged  Modes of Intervention: Clarification, Confrontation, Discussion, Education, Exploration, Limit-setting, Orientation, Problem-solving, Rapport Building, Dance movement psychotherapisteality Testing, Socialization and Support  Summary of Progress/Problems: The topic for today was feelings about relapse. Pt discussed what relapse prevention is to them and identified triggers that they are on the path to relapse. Pt processed their feeling towards relapse and was able to relate to peers. Pt discussed coping skills that can be used for relapse prevention. This patient reports he get unwell when he neglects to take his medication and keep his follow up community appointments. He understands this leads to his hospitalization. He will attempt to remain on medications and practice self care- and focus on his grand kids. Paulla Mcclaskey BeachBandi, LCSW

## 2016-04-12 NOTE — Progress Notes (Signed)
Patient is calm & isolated in the room most of the morning.Stated that his depression is getting better & hearing the voices for suicidal ideations is less.Compliant with medications.Attended groups.Appropriate with staff & peers.Supprot & encouragement given.

## 2016-04-12 NOTE — Progress Notes (Signed)
Recreation Therapy Notes  Date: 10.13.17 Time: 9:30 am Location: Craft Room  Group Topic: Self-expression/Coping Skills  Goal Area(s) Addresses:  Patient will effectively use art as a means of self-expression. Patient will recognize positive benefit of self-expression. Patient will be able to identify one emotion experienced during group session. Patient will identify use of art/self-expression as a coping skill.  Behavioral Response: Attentive  Intervention: Two Faces of Me  Activity: Patients were given a blank face worksheet and instructed to draw a line down the middle. On one side, patients were instructed to draw or write how they felt when they were admitted to the hospital and on the other side, patients were instructed to draw or write how they want to feel when they are d/c.  Education: LRT educated patients on other forms of self-expression.  Education Outcome: Acknowledges education/In group clarification offered  Clinical Observations/Feedback: Patient completed activity by writing how he felt when he was admitted and how he wants to feel when he is d/c. Patient did not contribute to group discussion.  Jacquelynn CreeGreene,Kelso Bibby M, LRT/CTRS 04/12/2016 10:19 AM

## 2016-04-12 NOTE — [facility] Group Notes (Signed)
[facility] Group Notes:  (Nursing/MHT/Case Management/Adjunct)  Date:  04/12/2016  Time:  4:17 PM  Type of Therapy:  Psychoeducational Skills  Participation Level:  Active  Participation Quality:  Appropriate and Attentive  Affect:  Appropriate  Cognitive:  Alert, Appropriate and Oriented  Insight:  Appropriate  Engagement in Group:  Engaged  Modes of Intervention:  Activity  Summary of Progress/Problems:  Charles Charles 04/12/2016, 4:17 PM

## 2016-04-13 MED ORDER — BENZTROPINE MESYLATE 1 MG PO TABS
0.5000 mg | ORAL_TABLET | Freq: Every day | ORAL | Status: DC
  Administered 2016-04-13 – 2016-04-15 (×3): 0.5 mg via ORAL
  Filled 2016-04-13 (×3): qty 1

## 2016-04-13 MED ORDER — ESCITALOPRAM OXALATE 10 MG PO TABS
10.0000 mg | ORAL_TABLET | Freq: Every day | ORAL | Status: DC
  Administered 2016-04-13: 10 mg via ORAL
  Filled 2016-04-13 (×2): qty 1

## 2016-04-13 NOTE — [facility] Group Notes (Signed)
[facility] Group Notes:  (Nursing/MHT/Case Management/Adjunct)  Date:  04/13/2016  Time:  9:33 PM  Type of Therapy:  Evening Wrap-up Group  Participation Level:  Minimal  Participation Quality:  Appropriate and Attentive  Affect:  Appropriate  Cognitive:  Alert and Appropriate  Insight:  Appropriate  Engagement in Group:  Developing/Improving  Modes of Intervention:  Discussion  Summary of Progress/Problems: Patient states his day was okay and that he is just trying to get over his daughter's death.  Sindy Mccune Nanta Elonna Mcfarlane 04/13/2016, 9:33 PM

## 2016-04-13 NOTE — [facility] Group Notes (Signed)
LCSW Group Therapy  Saturday 10/14/20107 Participation: Active Therapeutic goals: Group discussed relationship between thoughts, feelings, and behavior.  Group members asked to identify how these were relevant to their admission into the hospital and look at opportunities to change patterns of thinking/behavior.  Reaction: Pt able to meet above therapeutic goals. Arizbeth Cawthorn, MSW, LCSW 

## 2016-04-13 NOTE — Progress Notes (Signed)
D: Observed pt walking in the hallway. Patient alert and oriented x4. Patient denies SI/HI/VH. Pt endorsed AH of his daughter's voice. Pt affect is blunted but brightening. Pt seen out of bed much more so than previous nights and was interacting more with peers. Pt talked about feeling sad over the anniversary of his daughter's death. Pt rated depression 8/10 and anxiety 6/10.  A: Offered active listening and support. Provided therapeutic communication. Administered scheduled medications. Encouraged pt to continue being more active on the unit. R: Pt pleasant and cooperative. Pt medication compliant. Will continue Q15 min. checks. Safety maintained.

## 2016-04-13 NOTE — Progress Notes (Signed)
[facility] MD Progress Note  04/13/2016 4:06 PM Charles Charles  MRN:  [id number]  Subjective:  Charles Charles is a 55 year old male with a history of schizophrenia and substance abuse admitted for worsening of psychosis with auditory hallucinations, depression, and suicidal ideation in the context of medication noncompliance and relapse on cocaine. He was restarted on Haldol and Cogentin that has been helpful in the past.  He reported that he has been feeling depressed due to the death anniversary of his daughter. He reported that he continues to have auditory hallucinations. He has started attending groups. He stated that he was drinking alcohol and was taking his medications but they were not helpful. He continues to keep secluding himself. However he is trying to get more involved. He stated that the voices are asking him to hurt himself. He has been compliant with his medications. He stated that the medications are making his vision blurry. He stated that he feels depressed and hopeless. We discussed about the medications in detail and he is interested in taking medications to help with his depression.  Patient currently denied having any suicidal ideations or plans.  . He agrees to a follow-up with intensive outpatient substance abuse treatment program. There are no somatic complaints. He tolerates medications well.  Principal Problem: Undifferentiated schizophrenia (HCC) Diagnosis:   Patient Active Problem List   Diagnosis Date Noted  . Undifferentiated schizophrenia (HCC) [F20.3] 04/10/2016  . Alcohol use disorder, moderate, dependence (HCC) [F10.20] 04/10/2016  . GERD (gastroesophageal reflux disease) [K21.9] 04/10/2016  . Tobacco use disorder [F17.200] 04/10/2016  . Suicidal ideation [R45.851] 04/09/2016  . Stimulant abuse [F15.10] 12/14/2014  . Cocaine use disorder, moderate, dependence (HCC) [F14.20] 12/17/2013  . Adult hypothyroidism [E03.9] 12/17/2013  . Drug-induced hallucinosis (HCC)  [F19.951] 12/17/2013  . Blurred vision [H53.8] 12/17/2013   Total Time spent with patient: 20 minutes  Past Psychiatric History: Schizophrenia and substance use.  Past Medical History:  Past Medical History:  Diagnosis Date  . GERD (gastroesophageal reflux disease)   . Headache   . Schizo-affective schizophrenia [facility])     Past Surgical History:  Procedure Laterality Date  . COLONOSCOPY WITH PROPOFOL N/A 08/04/2015   Procedure: COLONOSCOPY WITH PROPOFOL;  Surgeon: Midge Minium, MD;  Location: [facility];  Service: Endoscopy;  Laterality: N/A;  . INCISION AND DRAINAGE PERIRECTAL ABSCESS N/A 07/14/2015   Procedure: IRRIGATION AND DEBRIDEMENT PERIRECTAL ABSCESS;  Surgeon: Lattie Haw, MD;  Location: [facility];  Service: General;  Laterality: N/A;  . INCISION AND DRAINAGE PERIRECTAL ABSCESS N/A 08/14/2015   Procedure: IRRIGATION AND DEBRIDEMENT PERIRECTAL ABSCESS;  Surgeon: Gladis Riffle, MD;  Location: [facility];  Service: General;  Laterality: N/A;   Family History:  Family History  Problem Relation Age of Onset  . Anxiety disorder Mother   . Diabetes Mother   . Diabetes Father    Family Psychiatric  History: See H&P. Social History:  History  Alcohol Use  . 33.6 oz/week  . 56 Cans of beer per week    Comment: 2 x 40's daily - pt says no alcohol for several weeks     History  Drug Use No    Comment: back in the days    Social History   Social History  . Marital status: Single    Spouse name: N/A  . Number of children: N/A  . Years of education: N/A   Social History Main Topics  . Smoking status: Current Every Day Smoker    Packs/day: 0.10  Types: Cigarettes  . Smokeless tobacco: Never Used     Comment: (1-2 cigs/day)  . Alcohol use 33.6 oz/week    56 Cans of beer per week     Comment: 2 x 40's daily - pt says no alcohol for several weeks  . Drug use: No     Comment: back in the days  . Sexual activity: Not Asked   Other Topics Concern  . None    Social History Narrative  . None   Additional Social History:                         Sleep: Fair  Appetite:  Poor  Current Medications: Current Facility-Administered Medications  Medication Dose Route Frequency Provider Last Rate Last Dose  . acetaminophen (TYLENOL) tablet 650 mg  650 mg Oral Q6H PRN Audery Amel, MD      . alum & mag hydroxide-simeth (MAALOX/MYLANTA) 200-200-20 MG/5ML suspension 30 mL  30 mL Oral Q4H PRN Audery Amel, MD      . benztropine (COGENTIN) tablet 0.5 mg  0.5 mg Oral QHS Brandy Hale, MD      . escitalopram (LEXAPRO) tablet 10 mg  10 mg Oral Daily Brandy Hale, MD   10 mg at 04/13/16 1300  . famotidine (PEPCID) tablet 20 mg  20 mg Oral BID Audery Amel, MD   20 mg at 04/13/16 1610  . haloperidol (HALDOL) tablet 5 mg  5 mg Oral QHS Audery Amel, MD   5 mg at 04/12/16 2109  . hydrOXYzine (ATARAX/VISTARIL) tablet 25 mg  25 mg Oral TID PRN Audery Amel, MD      . levothyroxine (SYNTHROID, LEVOTHROID) tablet 50 mcg  50 mcg Oral QAC breakfast Audery Amel, MD   50 mcg at 04/13/16 0655  . magnesium hydroxide (MILK OF MAGNESIA) suspension 30 mL  30 mL Oral Daily PRN Audery Amel, MD      . traZODone (DESYREL) tablet 50 mg  50 mg Oral QHS Audery Amel, MD   50 mg at 04/12/16 2109    Lab Results:  No results found for this or any previous visit (from the past 48 hour(s)).  Blood Alcohol level:  Lab Results  Component Value Date   ETH 61 (H) 04/09/2016   ETH 133 (H) 12/23/2014    Metabolic Disorder Labs: Lab Results  Component Value Date   HGBA1C 5.5 04/11/2016   MPG 111 04/11/2016   No results found for: PROLACTIN Lab Results  Component Value Date   CHOL 162 04/11/2016   TRIG 75 04/11/2016   HDL 62 04/11/2016   CHOLHDL 2.6 04/11/2016   VLDL 15 04/11/2016   LDLCALC 85 04/11/2016   LDLCALC 76 01/27/2014    Physical Findings: AIMS:  , ,  ,  ,    CIWA:    COWS:     Musculoskeletal: Strength & Muscle Tone: within  normal limits Gait & Station: normal Patient leans: N/A  Psychiatric Specialty Exam: Physical Exam  Nursing note and vitals reviewed.   Review of Systems  Psychiatric/Behavioral: Positive for depression, hallucinations, substance abuse and suicidal ideas.  All other systems reviewed and are negative.   Blood pressure 101/66, pulse (!) 59, temperature 98.7 F (37.1 C), temperature source Oral, resp. rate 18, height 5\' 3"  (1.6 m), weight 139 lb (63 kg), SpO2 100 %.Body mass index is 24.62 kg/m.  General Appearance: Casual  Eye Contact:  Good  Speech:  Clear and Coherent  Volume:  Normal  Mood:  Anxious, Hopeless and Worthless  Affect:  Blunt  Thought Process:  Goal Directed and Descriptions of Associations: Intact  Orientation:  Full (Time, Place, and Person)  Thought Content:  Hallucinations: Auditory  Suicidal Thoughts:  Yes.  with intent/plan  Homicidal Thoughts:  No  Memory:  Immediate;   Fair Recent;   Fair Remote;   Fair  Judgement:  Poor  Insight:  Lacking  Psychomotor Activity:  Psychomotor Retardation  Concentration:  Concentration: Fair and Attention Span: Fair  Recall:  [employer]  Fund of Knowledge:  Fair  Language:  Fair  Akathisia:  No  Handed:  Right  AIMS (if indicated):     Assets:  Communication Skills Desire for Improvement Financial Resources/Insurance Housing Physical Health Resilience Social Support  ADL's:  Intact  Cognition:  WNL  Sleep:  Number of Hours: 7.25     Treatment Plan Summary: Daily contact with patient to assess and evaluate symptoms and progress in treatment and Medication management   Charles Charles is a 55 year old male with history of schizophrenia and substance use admitted for worsening of psychosis, suicidal or homicidal ideation, and substance use in the context of treatment noncompliance.  1. Suicidal and homicidal ideation. The patient is able to contract for safety in the hospital.  2. Psychosis. He was restarted on Haldol  and Cogentin for psychosis. We offered Haldol decanoate injection to improve compliance but the patient refused.I will decrease the dose of Cogentin due to his vision problems.  Depression I will start him on Lexapro 10 mg daily and he agreed with the plan.  3. Insomnia. Trazodone is available.  4. GERD. He is on Pepcid.  5. Metabolic syndrome monitoring. Lipid profile, TSH, and hemoglobin A1c are normal.   6. EKG. Normal EKG.   7. Hypothyroidism. He is on Synthroid.  8. Substance abuse. The patient denies heavy drinking. We will monitor for symptoms of alcohol withdrawal.  9. Substance abuse treatment. The patient minimizes his problems and declines residential treatment but is interested in SA IOP.  10. Weight loss. The patient requested double portions.   11. Disposition. He will be discharged back to his boarding house. He will follow up with [facility].  Brandy HaleUzma Kemauri Musa, MD 04/13/2016, 4:06 PM

## 2016-04-13 NOTE — Plan of Care (Signed)
Problem: Activity: Goal: Interest or engagement in leisure activities will improve Outcome: Progressing Pt seen being more active on the milieu and interacting more with peers.

## 2016-04-13 NOTE — Progress Notes (Signed)
Pt has been pleasant and cooperative. Pt is continuing to have passive SI but is able to contract for safety. Pt has been seclusive to his room most of the day. Pt continues to endorse having A/ hallucinations.

## 2016-04-14 ENCOUNTER — Inpatient Hospital Stay: Payer: Medicaid Other

## 2016-04-14 DIAGNOSIS — R079 Chest pain, unspecified: Secondary | ICD-10-CM

## 2016-04-14 DIAGNOSIS — R1013 Epigastric pain: Secondary | ICD-10-CM

## 2016-04-14 LAB — EXPECTORATED SPUTUM ASSESSMENT W GRAM STAIN, RFLX TO RESP C

## 2016-04-14 LAB — EXPECTORATED SPUTUM ASSESSMENT W REFEX TO RESP CULTURE

## 2016-04-14 LAB — LIPASE, BLOOD: LIPASE: 30 U/L (ref 11–51)

## 2016-04-14 LAB — TROPONIN I: Troponin I: <0.03 ng/mL (ref <0.03)

## 2016-04-14 MED ORDER — PANTOPRAZOLE SODIUM 40 MG PO TBEC
40.0000 mg | DELAYED_RELEASE_TABLET | Freq: Two times a day (BID) | ORAL | Status: DC
  Administered 2016-04-14 – 2016-04-16 (×4): 40 mg via ORAL
  Filled 2016-04-14 (×4): qty 1

## 2016-04-14 MED ORDER — IPRATROPIUM-ALBUTEROL 0.5-2.5 (3) MG/3ML IN SOLN
3.0000 mL | Freq: Four times a day (QID) | RESPIRATORY_TRACT | Status: DC | PRN
Start: 2016-04-14 — End: 2016-04-16

## 2016-04-14 MED ORDER — NITROGLYCERIN 2 % TD OINT
0.5000 [in_us] | TOPICAL_OINTMENT | Freq: Three times a day (TID) | TRANSDERMAL | Status: DC
  Administered 2016-04-14 – 2016-04-15 (×3): 0.5 [in_us] via TOPICAL
  Filled 2016-04-14: qty 1
  Filled 2016-04-14: qty 30

## 2016-04-14 MED ORDER — IPRATROPIUM-ALBUTEROL 0.5-2.5 (3) MG/3ML IN SOLN
3.0000 mL | Freq: Four times a day (QID) | RESPIRATORY_TRACT | Status: DC
  Administered 2016-04-14: 3 mL via RESPIRATORY_TRACT
  Filled 2016-04-14: qty 3

## 2016-04-14 MED ORDER — GUAIFENESIN ER 600 MG PO TB12
600.0000 mg | ORAL_TABLET | Freq: Two times a day (BID) | ORAL | Status: DC
  Administered 2016-04-14 – 2016-04-16 (×4): 600 mg via ORAL
  Filled 2016-04-14 (×5): qty 1

## 2016-04-14 MED ORDER — NICOTINE 21 MG/24HR TD PT24
21.0000 mg | MEDICATED_PATCH | Freq: Every day | TRANSDERMAL | Status: DC
  Filled 2016-04-14: qty 1

## 2016-04-14 MED ORDER — HYDROCOD POLST-CPM POLST ER 10-8 MG/5ML PO SUER
5.0000 mL | Freq: Two times a day (BID) | ORAL | Status: DC
  Administered 2016-04-14 – 2016-04-16 (×4): 5 mL via ORAL
  Filled 2016-04-14 (×3): qty 5

## 2016-04-14 MED ORDER — LEVOFLOXACIN 500 MG PO TABS: 500.0000 mg | ORAL_TABLET | Freq: Every day | ORAL | Status: DC

## 2016-04-14 MED ORDER — ASPIRIN EC 81 MG PO TBEC
81.0000 mg | DELAYED_RELEASE_TABLET | Freq: Every day | ORAL | Status: DC
  Administered 2016-04-15 – 2016-04-16 (×2): 81 mg via ORAL
  Filled 2016-04-14 (×2): qty 1

## 2016-04-14 MED ORDER — ESCITALOPRAM OXALATE 10 MG PO TABS
5.0000 mg | ORAL_TABLET | Freq: Every morning | ORAL | Status: DC
  Administered 2016-04-14 – 2016-04-15 (×2): 5 mg via ORAL
  Filled 2016-04-14: qty 1

## 2016-04-14 NOTE — Progress Notes (Signed)
Notified by lab that additional sputum sample is needed. Pt given cup and encouraged to deep breathe and cough. Awaiting sample.

## 2016-04-14 NOTE — Progress Notes (Signed)
Pt c/o having chest discomfort grabbing at his chest. About 1640hrs. Pt taken tohis room and V'S taken at about 1645hrs. B/P-105/64 P-273--68 SPO2=100% RRP called. At 1700hrs Dr Garnetta BuddyFaheem called. At 17oohrs Pt given atatax 25mg s and pepcid ac 20mg s.. STAT Consult called for.

## 2016-04-14 NOTE — Plan of Care (Signed)
Problem: Self-Concept: Goal: Ability to disclose and discuss suicidal ideas will improve Outcome: Progressing Pt denies SI at this time     

## 2016-04-14 NOTE — Progress Notes (Signed)
CH responded to a Rapid Response for Pt in BH 306. Pt was complaining of chest pain. AC and response team were in place. Nurse was attempting to contact a hospitalitis. Pt was sitting and in no apparent pain at the time of my arrival. [facility] the situation was in control. CH is available for follow up as needed.    04/14/16 1700  Clinical Encounter Type  Visited With Patient;Health care provider  Visit Type Initial;Spiritual support;Behavioral Health;Code (Code: Rapid Responce)  Referral From Nurse  Consult/Referral To Chaplain  Spiritual Encounters  Spiritual Needs Prayer

## 2016-04-14 NOTE — Progress Notes (Signed)
Rapid Response called at approx. 1640. Upon my arrival to room 306 patient sitting in wheelchair at bedside. Wallis BambergFrieda, RT in process of doing 12 lead EKG on the patient. Adam, RN [place]ham from ICU already at bedside assessing patient. Nurse reports patient was walking to dining area when he c/o chest pain. He is A&O x3, skin warm and dry, denies chest pain or SOB at this time. States "I feel really shaking." Tremors noted. VSS- BP 105/64 HR 65 R 20 RA oxi 100%. NT and myself assisted patient back to bed. No distress noted, just fine tremors of extremities. I instructed Cliff to call Dr. Garnetta BuddyFaheem and ask that she order a stat Hospitalist consult. Dr. Winona LegatoVaickute called and in to see patient. See Dr. Arlys JohnVaickute's note and orders. Patient remains stable in no acute distress. Daron OfferCliff stated would let AC/Bed Placement know if at some point patient needs to be transferred to medical unit.

## 2016-04-14 NOTE — Plan of Care (Signed)
Problem: Medication: Goal: Compliance with prescribed medication regimen will improve Outcome: Progressing Pt taking medications as prescribed.  Problem: Safety: Goal: Ability to remain free from injury will improve Outcome: Progressing Pt remains free from harm.

## 2016-04-14 NOTE — Progress Notes (Signed)
D: Pt is pleasant and cooperative this evening. He is seen in the milieu, some interaction noted. Denies SI/HI/AVH at this time. A: Emotional support and encouragement provided. Medications administered with education. q15 minute safety checks maintained. R: Pt remains free from harm. Will continue to monitor.

## 2016-04-14 NOTE — Consult Note (Signed)
[facility] - [place] at [facility]   PATIENT NAME: Charles Charles    MR#:  [id number]  DATE OF BIRTH:  1960-07-22  DATE OF ADMISSION:  04/10/2016  PRIMARY CARE PHYSICIAN: [facility]   REQUESTING/REFERRING PHYSICIAN: Dr Garnetta Buddy  CHIEF COMPLAINT:  No chief complaint on file.   HISTORY OF PRESENT ILLNESS:  Charles Charles  is a 55 y.o. 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 Today, however, he was walking to the dining hall, and developed midsternal chest pain which was described as sharp 10 out of 10 by intensity, intermittent pain lasting 15-20 minutes, increasing whenever he was walking, associated with shortness of breath and feeling presyncopal, feeling cold. He was also shaking, tremulous. He felt somewhat weak. He admits of coughing and some phlegm production. Admits of smoking for the past 30 years. He denies any chest pains in the past. Hospitalist services were contacted for evaluation.   PAST MEDICAL HISTORY:   Past Medical History:  Diagnosis Date  . GERD (gastroesophageal reflux disease)   . Headache   . Schizo-affective schizophrenia (HCC)     PAST SURGICAL HISTOIRY:   Past Surgical History:  Procedure Laterality Date  . COLONOSCOPY WITH PROPOFOL N/A 08/04/2015   Procedure: COLONOSCOPY WITH PROPOFOL;  Surgeon: Midge Minium, MD;  Location: [facility];  Service: Endoscopy;  Laterality: N/A;  . INCISION AND DRAINAGE PERIRECTAL ABSCESS N/A 07/14/2015   Procedure: IRRIGATION AND DEBRIDEMENT PERIRECTAL ABSCESS;  Surgeon: Lattie Haw, MD;  Location: [facility] ORS;  Service: General;  Laterality: N/A;  . INCISION AND DRAINAGE PERIRECTAL ABSCESS N/A 08/14/2015   Procedure: IRRIGATION AND DEBRIDEMENT PERIRECTAL ABSCESS;  Surgeon: Gladis Riffle, MD;  Location: [facility] ORS;  Service:  General;  Laterality: N/A;    SOCIAL HISTORY:   Social History  Substance Use Topics  . Smoking status: Current Every Day Smoker    Packs/day: 0.10    Types: Cigarettes  . Smokeless tobacco: Never Used     Comment: (1-2 cigs/day)  . Alcohol use 33.6 oz/week    56 Cans of beer per week     Comment: 2 x 40's daily - pt says no alcohol for several weeks    FAMILY HISTORY:   Family History  Problem Relation Age of Onset  . Anxiety disorder Mother   . Diabetes Mother   . Diabetes Father     DRUG ALLERGIES:   Allergies  Allergen Reactions  . Penicillins Anaphylaxis, Hives and Other (See Comments)    Has patient had a PCN reaction causing immediate rash, facial/tongue/throat swelling, SOB or lightheadedness with hypotension: Yes Has patient had a PCN reaction causing severe rash involving mucus membranes or skin necrosis: No Has patient had a PCN reaction that required hospitalization No Has patient had a PCN reaction occurring within the last 10 years: Yes If all of the above answers are "NO", then may proceed with Cephalosporin use.    REVIEW OF SYSTEMS:  CONSTITUTIONAL: No fever, Admits of feeling cold, fatigue and weakness. Admits of weight loss of approximately 135 pounds over the past 2 or 3 months EYES: Admits of blurred and double vision intermittently.  EARS, NOSE, AND THROAT: No tinnitus or ear pain.  RESPIRATORY: Admits of cough, shortness of breath, wheezing , but no hemoptysis. Dark sputum, chronically CARDIOVASCULAR: Admits of midsternal chest pain, worsening with chest pressure and walking around, no radiation, no  orthopnea, edema. Feeling presyncopal GASTROINTESTINAL: No nausea, vomiting, diarrhea or abdominal pain. Admits of rectal bleeding since 6 or 7 months ago drainage of recurrent perirectal abscess in February 2017 by Dr. Excell Seltzer GENITOURINARY: No dysuria, hematuria.  ENDOCRINE: No polyuria, nocturia,  HEMATOLOGY: No anemia, easy bruising or  bleeding SKIN: No rash or lesion. MUSCULOSKELETAL: No joint pain or arthritis.  Tendinitis of right elbow NEUROLOGIC: No tingling, numbness, weakness.  PSYCHIATRY: No anxiety or depression.   MEDICATIONS AT HOME:   Prior to Admission medications   Medication Sig Start Date End Date Taking? Authorizing Provider  benztropine (COGENTIN) 1 MG tablet Take 1 tablet (1 mg total) by mouth daily. 03/20/16 03/20/17  Emily Filbert, MD  famotidine (PEPCID) 20 MG tablet Take 1 tablet (20 mg total) by mouth 2 (two) times daily. 03/20/16   Emily Filbert, MD  haloperidol (HALDOL) 5 MG tablet Take 1 tablet (5 mg total) by mouth at bedtime. 03/20/16   Emily Filbert, MD  ibuprofen (ADVIL,MOTRIN) 600 MG tablet Take 1 tablet (600 mg total) by mouth every 6 (six) hours as needed. 09/15/15   Gladis Riffle, MD  Polyethylene Glycol POWD Take 17 g by mouth daily. 09/15/15   Gladis Riffle, MD  traZODone (DESYREL) 50 MG tablet Take 1 tablet (50 mg total) by mouth at bedtime. 03/20/16   Emily Filbert, MD      VITAL SIGNS:  Blood pressure 105/64, pulse 65, temperature 98.1 F (36.7 C), temperature source Oral, resp. rate 20, height 5\' 3"  (1.6 m), weight 63 kg (139 lb), SpO2 100 %.  PHYSICAL EXAMINATION:  GENERAL:  55 y.o.-year-old patient lying in the bed with no acute distress.  EYES: Pupils equal, round, reactive to light and accommodation. No scleral icterus. Extraocular muscles intact.  HEENT: Head atraumatic, normocephalic. Oropharynx and nasopharynx clear.  NECK:  Supple, no jugular venous distention. No thyroid enlargement, no tenderness.  LUNGS: Diminished breath sounds bilaterally, no wheezing, significant right-sided rales,rhonchi and crepitations. No significant dullness to percussion.  No use of accessory muscles of respiration. Chest is tender to palpation anteriorly in the sternal area and epigastric region, pain resembles the pain the patient had earlier CARDIOVASCULAR: S1,  S2 normal. No murmurs, rubs, or gallops.  ABDOMEN: Soft, tender in the epigastric area, but overall diffusely but no rebound or guarding was noted, nondistended. Bowel sounds present. No organomegaly or mass.  EXTREMITIES: No pedal edema, cyanosis, or clubbing.  NEUROLOGIC: Cranial nerves II through XII are intact. Muscle strength 5/5 in all extremities. Sensation intact. Gait not checked.  PSYCHIATRIC: The patient is alert and oriented x 3.  SKIN: No obvious rash, lesion, or ulcer.   LABORATORY PANEL:   CBC  Recent Labs Lab 04/09/16 1841  WBC 9.2  HGB 15.3  HCT 44.5  PLT 342   ------------------------------------------------------------------------------------------------------------------  Chemistries   Recent Labs Lab 04/09/16 1841  NA 138  K 3.8  CL 104  CO2 22  GLUCOSE 98  BUN 12  CREATININE 0.99  CALCIUM 9.7  AST 23  ALT 16*  ALKPHOS 62  BILITOT 0.3   ------------------------------------------------------------------------------------------------------------------  Cardiac Enzymes No results for input(s): TROPONINI in the last 168 hours. ------------------------------------------------------------------------------------------------------------------  RADIOLOGY:  No results found.  EKG:   Orders placed or performed during the hospital encounter of 04/10/16  . EKG 12-Lead  . EKG 12-Lead  . EKG 12-Lead  . EKG 12-Lead  . EKG 12-Lead  . EKG 12-Lead   EKG revealed sinus or junctional rhythm  with no acute ST-T changes, interpretation is difficult due to and even background due to patient's tremors IMPRESSION AND PLAN:    Principal Problem:   Undifferentiated schizophrenia (HCC) Active Problems:   Cocaine use disorder, moderate, dependence (HCC)   Adult hypothyroidism   Suicidal ideation   Alcohol use disorder, moderate, dependence (HCC)   GERD (gastroesophageal reflux disease)   Tobacco use disorder   Chest pain   Abdominal pain,  epigastric   #1. Chest pain of unclear etiology, continuing both atypical and typical features, initiate patient on low-dose of metoprolol if repeated urine drug screen is negative for cocaine, aspirin, check cardiac enzymes 3, get Myoview stress test in the morning #2. Cocaine abuse, repeat urine drug screen #3. Rales in the lungs, concerning for pneumonia, questionable aspiration pneumonitis, get chest x-ray, CT scan of the chest if needed #4. Diffuse abdominal pain, especially in the epigastric region, get lipase level, LFTs were unremarkable. In the past, patient may benefit from CT of abdomen and pelvis, initiate him on PPI, follow clinically  #5. Tobacco abuse, discussed this patient for 3 minutes, nicotine replacement therapy will be initiated  All the records are reviewed and case discussed with Consulting provider. Management plans discussed with the patient, family and they are in agreement.  CODE STATUS: Full code  TOTAL TIME TAKING CARE OF THIS PATIENT: 60 minutes.    Katharina CaperVAICKUTE,Everette Dimauro M.D on 04/14/2016 at 6:01 PM  Between 7am to 6pm - Pager - [phone]  After 6pm go to [email or web address] - password [email or web address] [facility]  [facility]  Office  [phone]  CC: Primary care Physician: [facility]

## 2016-04-14 NOTE — [facility] Group Notes (Signed)
[facility] LCSW Group Therapy  04/14/2016 2:48 PM  Type of Therapy:  Group Therapy  Participation Level:  Active  Participation Quality:  Attentive  Affect:  Appropriate  Cognitive:  Alert  Insight:  Improving  Engagement in Therapy:  Improving  Modes of Intervention:  Discussion, Education, Socialization and Support  Summary of Progress/Problems: Balance in life: Patients will discuss the concept of balance and how it looks and feels to be unbalanced. Pt will identify areas in their life that is unbalanced and ways to become more balanced.  Henreitta CeaGarry states he struggles every October due to the anniversary of the death of his daughter. He reports feeling a lot of guilt due to being in prison at the time of her death.   Janyia Guion L Shelby Peltz MSW, LCSWA  04/14/2016, 2:48 PM

## 2016-04-14 NOTE — Progress Notes (Signed)
Pt has been pleasant and cooperative. Pt is continuing to have passive SI but is able to contract for safety. Pt has been seclusive to his room most of the day. Pt continues to endorse having A/ hallucinations. Pt's mood and affect has been a little less depressed.

## 2016-04-14 NOTE — Progress Notes (Signed)
[facility] MD Progress Note  04/14/2016 10:52 AM Charles Charles  MRN:  [id number]  Subjective:  Charles Charles is a 55 year old male with a history of schizophrenia and substance abuse admitted for worsening of psychosis with auditory hallucinations, depression, and suicidal ideation in the context of medication noncompliance and relapse on cocaine. He was restarted on Haldol and Cogentin that has been helpful in the past.  Patient was seen this morning. He reported that he was started on Lexapro yesterday which made him very hyper and anxious. He reported that he wants to decrease the dose of the medication as the Lexapro makes him feel like a pinball. He was happy and smiling today. The vision is also improving since we cut down on the Cogentin. He currently denied having any suicidal ideations. He was happy and interactive during the interview.     Patient currently denied having any suicidal ideations or plans.  He agrees to a follow-up with intensive outpatient substance abuse treatment program. There are no somatic complaints. He tolerates medications well.  Principal Problem: Undifferentiated schizophrenia (HCC) Diagnosis:   Patient Active Problem List   Diagnosis Date Noted  . Undifferentiated schizophrenia (HCC) [F20.3] 04/10/2016  . Alcohol use disorder, moderate, dependence (HCC) [F10.20] 04/10/2016  . GERD (gastroesophageal reflux disease) [K21.9] 04/10/2016  . Tobacco use disorder [F17.200] 04/10/2016  . Suicidal ideation [R45.851] 04/09/2016  . Stimulant abuse [F15.10] 12/14/2014  . Cocaine use disorder, moderate, dependence (HCC) [F14.20] 12/17/2013  . Adult hypothyroidism [E03.9] 12/17/2013  . 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 Blurred vision [H53.8] 12/17/2013   Total Time spent with patient: 20 minutes  Past Psychiatric History: Schizophrenia and substance use.  Past Medical History:  Past Medical History:  Diagnosis Date  . GERD (gastroesophageal reflux  disease)   . Headache   . Schizo-affective schizophrenia [facility])     Past Surgical History:  Procedure Laterality Date  . COLONOSCOPY WITH PROPOFOL N/A 08/04/2015   Procedure: COLONOSCOPY WITH PROPOFOL;  Surgeon: Midge Minium, MD;  Location: [facility];  Service: Endoscopy;  Laterality: N/A;  . INCISION AND DRAINAGE PERIRECTAL ABSCESS N/A 07/14/2015   Procedure: IRRIGATION AND DEBRIDEMENT PERIRECTAL ABSCESS;  Surgeon: Lattie Haw, MD;  Location: [facility] ORS;  Service: General;  Laterality: N/A;  . INCISION AND DRAINAGE PERIRECTAL ABSCESS N/A 08/14/2015   Procedure: IRRIGATION AND DEBRIDEMENT PERIRECTAL ABSCESS;  Surgeon: Gladis Riffle, MD;  Location: [facility] ORS;  Service: General;  Laterality: N/A;   Family History:  Family History  Problem Relation Age of Onset  . Anxiety disorder Mother   . Diabetes Mother   . Diabetes Father    Family Psychiatric  History: See H&P. Social History:  History  Alcohol Use  . 33.6 oz/week  . 56 Cans of beer per week    Comment: 2 x 40's daily - pt says no alcohol for several weeks     History  Drug Use No    Comment: back in the days    Social History   Social History  . Marital status: Single    Spouse name: N/A  . Number of children: N/A  . Years of education: N/A   Social History Main Topics  . Smoking status: Current Every Day Smoker    Packs/day: 0.10    Types: Cigarettes  . Smokeless tobacco: Never Used     Comment: (1-2 cigs/day)  . Alcohol use 33.6 oz/week    56 Cans of beer per week     Comment: 2  x 40's daily - pt says no alcohol for several weeks  . Drug use: No     Comment: back in the days  . Sexual activity: Not Asked   Other Topics Concern  . None   Social History Narrative  . None   Additional Social History:                         Sleep: Fair  Appetite:  Poor  Current Medications: Current Facility-Administered Medications  Medication Dose Route Frequency Provider Last Rate Last Dose   . acetaminophen (TYLENOL) tablet 650 mg  650 mg Oral Q6H PRN Audery Amel, MD      . alum & mag hydroxide-simeth (MAALOX/MYLANTA) 200-200-20 MG/5ML suspension 30 mL  30 mL Oral Q4H PRN Audery Amel, MD      . benztropine (COGENTIN) tablet 0.5 mg  0.5 mg Oral QHS Brandy Hale, MD   0.5 mg at 04/13/16 2118  . escitalopram (LEXAPRO) tablet 5 mg  5 mg Oral q morning - 10a Brandy Hale, MD      . famotidine (PEPCID) tablet 20 mg  20 mg Oral BID Audery Amel, MD   20 mg at 04/14/16 0858  . haloperidol (HALDOL) tablet 5 mg  5 mg Oral QHS Audery Amel, MD   5 mg at 04/13/16 2118  . hydrOXYzine (ATARAX/VISTARIL) tablet 25 mg  25 mg Oral TID PRN Audery Amel, MD      . levothyroxine (SYNTHROID, LEVOTHROID) tablet 50 mcg  50 mcg Oral QAC breakfast Audery Amel, MD   50 mcg at 04/14/16 1610  . magnesium hydroxide (MILK OF MAGNESIA) suspension 30 mL  30 mL Oral Daily PRN Audery Amel, MD      . traZODone (DESYREL) tablet 50 mg  50 mg Oral QHS Audery Amel, MD   50 mg at 04/13/16 2118    Lab Results:  No results found for this or any previous visit (from the past 48 hour(s)).  Blood Alcohol level:  Lab Results  Component Value Date   ETH 61 (H) 04/09/2016   ETH 133 (H) 12/23/2014    Metabolic Disorder Labs: Lab Results  Component Value Date   HGBA1C 5.5 04/11/2016   MPG 111 04/11/2016   No results found for: PROLACTIN Lab Results  Component Value Date   CHOL 162 04/11/2016   TRIG 75 04/11/2016   HDL 62 04/11/2016   CHOLHDL 2.6 04/11/2016   VLDL 15 04/11/2016   LDLCALC 85 04/11/2016   LDLCALC 76 01/27/2014    Physical Findings: AIMS:  , ,  ,  ,    CIWA:    COWS:     Musculoskeletal: Strength & Muscle Tone: within normal limits Gait & Station: normal Patient leans: N/A  Psychiatric Specialty Exam: Physical Exam  Nursing note and vitals reviewed.   Review of Systems  Psychiatric/Behavioral: Positive for depression, hallucinations, substance abuse and suicidal  ideas.  All other systems reviewed and are negative.   Blood pressure 110/66, pulse 77, temperature 98.1 F (36.7 C), temperature source Oral, resp. rate 20, height 5\' 3"  (1.6 m), weight 139 lb (63 kg), SpO2 100 %.Body mass index is 24.62 kg/m.  General Appearance: Casual  Eye Contact:  Good  Speech:  Clear and Coherent  Volume:  Normal  Mood:  Anxious, Hopeless and Worthless  Affect:  Blunt  Thought Process:  Goal Directed and Descriptions of Associations: Intact  Orientation:  Full (  Time, Place, and Person)  Thought Content:  Hallucinations: Auditory  Suicidal Thoughts:  Yes.  with intent/plan  Homicidal Thoughts:  No  Memory:  Immediate;   Fair Recent;   Fair Remote;   Fair  Judgement:  Poor  Insight:  Lacking  Psychomotor Activity:  Normal  Concentration:  Concentration: Fair and Attention Span: Fair  Recall:  [employer]Fair  Fund of Knowledge:  Fair  Language:  Fair  Akathisia:  No  Handed:  Right  AIMS (if indicated):     Assets:  Communication Skills Desire for Improvement Financial Resources/Insurance Housing Physical Health Resilience Social Support  ADL's:  Intact  Cognition:  WNL  Sleep:  Number of Hours: 6.5     Treatment Plan Summary: Daily contact with patient to assess and evaluate symptoms and progress in treatment and Medication management   Charles Charles is a 55 year old male with history of schizophrenia and substance use admitted for worsening of psychosis, suicidal or homicidal ideation, and substance use in the context of treatment noncompliance.  1. Suicidal and homicidal ideation. The patient is able to contract for safety in the hospital.  2. Psychosis. He was restarted on Haldol and Cogentin for psychosis. We offered Haldol decanoate injection to improve compliance but the patient refused.I will decrease the dose of Cogentin due to his vision problems.  Depression I will start him on Lexapro 5 mg daily and he agreed with the plan.  3. Insomnia.  Trazodone is available.  4. GERD. He is on Pepcid.  5. Metabolic syndrome monitoring. Lipid profile, TSH, and hemoglobin A1c are normal.   6. EKG. Normal EKG.   7. Hypothyroidism. He is on Synthroid.  8. Substance abuse. The patient denies heavy drinking. We will monitor for symptoms of alcohol withdrawal.  9. Substance abuse treatment. The patient minimizes his problems and declines residential treatment but is interested in SA IOP.  10. Weight loss. The patient requested double portions.   11. Disposition. He will be discharged back to his boarding house. He will follow up with [facility].  Brandy HaleUzma Anyela Napierkowski, MD 04/14/2016, 10:52 AM

## 2016-04-15 LAB — EXPECTORATED SPUTUM ASSESSMENT W GRAM STAIN, RFLX TO RESP C

## 2016-04-15 LAB — EXPECTORATED SPUTUM ASSESSMENT W REFEX TO RESP CULTURE

## 2016-04-15 LAB — URINE DRUG SCREEN, QUALITATIVE (ARMC ONLY)
AMPHETAMINES, UR SCREEN: NOT DETECTED
BARBITURATES, UR SCREEN: NOT DETECTED
BENZODIAZEPINE, UR SCRN: NOT DETECTED
COCAINE METABOLITE, UR ~~LOC~~: NOT DETECTED
Cannabinoid 50 Ng, Ur ~~LOC~~: NOT DETECTED
MDMA (Ecstasy)Ur Screen: NOT DETECTED
Methadone Scn, Ur: NOT DETECTED
OPIATE, UR SCREEN: POSITIVE — AB
PHENCYCLIDINE (PCP) UR S: NOT DETECTED
Tricyclic, Ur Screen: NOT DETECTED

## 2016-04-15 LAB — TROPONIN I: Troponin I: 0.03 ng/mL (ref <0.03)

## 2016-04-15 MED ORDER — TRAZODONE HCL 50 MG PO TABS: 50.0000 mg | ORAL_TABLET | Freq: Every day | ORAL | 1 refills | Status: DC

## 2016-04-15 MED ORDER — HALOPERIDOL 5 MG PO TABS: 5.0000 mg | ORAL_TABLET | Freq: Every day | ORAL | 1 refills | Status: DC

## 2016-04-15 MED ORDER — BENZTROPINE MESYLATE 0.5 MG PO TABS: 0.5000 mg | ORAL_TABLET | Freq: Every day | ORAL | 1 refills | Status: DC

## 2016-04-15 MED ORDER — ASPIRIN 81 MG PO TBEC: 81.0000 mg | DELAYED_RELEASE_TABLET | Freq: Every day | ORAL | 1 refills | Status: DC

## 2016-04-15 MED ORDER — LEVOTHYROXINE SODIUM 50 MCG PO TABS: 50.0000 ug | ORAL_TABLET | Freq: Every day | ORAL | 1 refills | Status: DC

## 2016-04-15 NOTE — Progress Notes (Signed)
Spoke with Dr. Jennet MaduroPucilowska regarding nuclear medicine requests for the following orders in order to complete stress test:  Cardiology consult NPO after midnight on day of test Trended troponins x 3 (ordered) No tussionex administered on day of testing   Denies chest pain at this time. VS stable. Safety maintained. Will continue to monitor.

## 2016-04-15 NOTE — B[facility] Suicide Risk Assessment (Signed)
[facility]   Principal Problem: Undifferentiated schizophrenia [facility](HCC) Discharge Diagnoses:  Patient Active Problem List   Diagnosis Date Noted  . Chest pain [R07.9] 04/14/2016  . Abdominal pain, epigastric [R10.13] 04/14/2016  . Undifferentiated schizophrenia (HCC) [F20.3] 04/10/2016  . Alcohol use disorder, moderate, dependence (HCC) [F10.20] 04/10/2016  . GERD (gastroesophageal reflux disease) [K21.9] 04/10/2016  . Tobacco use disorder [F17.200] 04/10/2016  . Suicidal ideation [R45.851] 04/09/2016  . Stimulant abuse [F15.10] 12/14/2014  . Cocaine use disorder, moderate, dependence (HCC) [F14.20] 12/17/2013  . Adult hypothyroidism [E03.9] 12/17/2013  . 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 Blurred vision [H53.8] 12/17/2013    Total Time spent with patient: 30 minutes  Musculoskeletal: Strength & Muscle Tone: within normal limits Gait & Station: normal Patient leans: N/A  Psychiatric Specialty Exam: Review of Systems  All other systems reviewed and are negative.   Blood pressure 102/61, pulse 62, temperature 97.9 F (36.6 C), temperature source Oral, resp. rate 18, height 5\' 3"  (1.6 m), weight 63 kg (139 lb), SpO2 100 %.Body mass index is 24.62 kg/m.  General Appearance: Casual  Eye Contact::  Good  Speech:  Clear and Coherent409  Volume:  Normal  Mood:  Euthymic  Affect:  Appropriate  Thought Process:  Goal Directed and Descriptions of Associations: Intact  Orientation:  Full (Time, Place, and Person)  Thought Content:  WDL  Suicidal Thoughts:  No  Homicidal Thoughts:  No  Memory:  Immediate;   Fair Recent;   Fair Remote;   Fair  Judgement:  Impaired  Insight:  Present  Psychomotor Activity:  Normal  Concentration:  Fair  Recall:  [employer]Fair  Fund of Knowledge:Fair  Language: Fair  Akathisia:  No  Handed:  Right  AIMS (if indicated):     Assets:  Communication Skills Desire for Improvement Financial  Resources/Insurance Housing Resilience Social Support  Sleep:  Number of Hours: 8.75  Cognition: WNL  ADL's:  Intact   Mental Status Per Nursing Assessment::   On Admission:     Demographic Factors:  Male  Loss Factors: NA  Historical Factors: Prior suicide attempts and Impulsivity  Risk Reduction Factors:   Sense of responsibility to family, Living with another person, especially a relative and Positive social support  Continued Clinical Symptoms:  Alcohol/Substance Abuse/Dependencies Schizophrenia:   Depressive state Paranoid or undifferentiated type  Cognitive Features That Contribute To Risk:  None    Suicide Risk:  Minimal: No identifiable suicidal ideation.  Patients presenting with no risk factors but with morbid ruminations; may be classified as minimal risk based on the severity of the depressive symptoms  Follow-up Information    [facility]. Go on 04/19/2016.   Why:  [facility] for assessment for medication management and therapy. Arrive as early as possible for prompt service. Please call Unk PintoHarvey Bryant at [phone] for questions and assistance. Contact information: Address: [address], [place], [place] [address] Phone: [phone] Fax: [phone]          Plan Of Care/Follow-up recommendations:  Activity:  as tolerated. Diet:  low sodium heart healthy. Other:  keep follow up appointments.  Kristine LineaJolanta Yordy Matton, MD 04/15/2016, 3:27 PM

## 2016-04-15 NOTE — Progress Notes (Signed)
Recreation Therapy Notes  Date: 10.16.17 Time: 9:30 am Location: Craft Room  Group Topic: Self-expression  Goal Area(s) Addresses:  Patient will identify one color per emotion listed on wheel. Patient will verbalize benefit of using art as a means of self-expression. Patient will verbalize one emotion experienced during session. Patient will be educated on other forms of self-expression.  Behavioral Response: Did not attend  Intervention: Emotion Wheel  Activity: Patients were given Emotion Wheel worksheets and instructed to pick a color for each emotion listed on the wheel.  Education: LRT educated patients on different forms of self-expression.  Education Outcome: Patient did not attend group.  Clinical Observations/Feedback: Patient did not attend group.  Wanna Gully M, LRT/CTRS 04/15/2016 10:31 AM 

## 2016-04-15 NOTE — Plan of Care (Signed)
Problem: Physical Regulation: Goal: Complications related to the disease process, condition or treatment will be avoided or minimized Outcome: Progressing Work up in progress for chest pain per medical. Trending troponin. Echo ordered. Stress test ordered. Denied chest pain most of today, until 1405. Nitro administered as ordered.

## 2016-04-15 NOTE — BHH Group Notes (Signed)
BHH Group Notes:  (Nursing/MHT/Case Management/Adjunct)  Date:  04/15/2016  Time:  10:22 PM  Type of Therapy:  Evening Wrap-up Group  Participation Level:  Active  Participation Quality:  Appropriate and Attentive  Affect:  Appropriate  Cognitive:  Alert and Appropriate  Insight:  Appropriate, Good and Improving  Engagement in Group:  Developing/Improving and Engaged  Modes of Intervention:  Discussion  Summary of Progress/Problems:  Charles MorrowChelsea Charles Charles Charles 04/15/2016, 10:22 PM

## 2016-04-15 NOTE — Progress Notes (Signed)
[facility] MD Progress Note  04/15/2016 11:34 AM Charles Charles  MRN:  [id number]  Subjective:  [name] is a 55 year old male with a history of schizophrenia and substance abuse admitted for worsening of psychosis with auditory hallucinations, depression, and suicidal ideation in the context of medication noncompliance and relapse on cocaine. He was restarted on Haldol and Cogentin that has been helpful in the past. Over the weekend, the patient experienced chest pain and was seen by medicine consultant.  Today the patient does not report any physical complaints. There is no chest pain and cough  is less severe with Levaquin treatment. We could not do Myoview this morning as he ate breakfast. First troponin is elevated. Medicine will follow up.  He denies any symptoms of depression or anxiety. Auditory hallucinations have resolved. He is not suicidal or homicidal. He tolerates Haldol well but has not been happy with Lexapro and it makes him anxious.    Principal Problem: Undifferentiated schizophrenia ([place]) Diagnosis:   Patient Active Problem List   Diagnosis Date Noted  . Chest pain [R07.9] 04/14/2016  . Abdominal pain, epigastric [R10.13] 04/14/2016  . Undifferentiated schizophrenia ([place]) [F20.3] 04/10/2016  . Alcohol use disorder, moderate, dependence ([place]) [F10.20] 04/10/2016  . GERD (gastroesophageal reflux disease) [K21.9] 04/10/2016  . Tobacco use disorder [F17.200] 04/10/2016  . Suicidal ideation [R45.851] 04/09/2016  . Stimulant abuse [F15.10] 12/14/2014  . Cocaine use disorder, moderate, dependence ([place]) [F14.20] 12/17/2013  . Adult hypothyroidism [E03.9] 12/17/2013  . Drug-induced hallucinosis ([place]) [F19.951] 12/17/2013  . Blurred vision [H53.8] 12/17/2013   Total Time spent with patient: 20 minutes  Past Psychiatric History: Schizophrenia and substance use.  Past Medical History:  Past Medical History:  Diagnosis Date  . GERD (gastroesophageal reflux disease)   . Headache   .  Schizo-affective schizophrenia [facility])     Past Surgical History:  Procedure Laterality Date  . COLONOSCOPY WITH PROPOFOL N/A 08/04/2015   Procedure: COLONOSCOPY WITH PROPOFOL;  Surgeon: Lucilla Lame, MD;  Location: [place];  Service: Endoscopy;  Laterality: N/A;  . INCISION AND DRAINAGE PERIRECTAL ABSCESS N/A 07/14/2015   Procedure: IRRIGATION AND DEBRIDEMENT PERIRECTAL ABSCESS;  Surgeon: Florene Glen, MD;  Location: [facility];  Service: General;  Laterality: N/A;  . INCISION AND DRAINAGE PERIRECTAL ABSCESS N/A 08/14/2015   Procedure: IRRIGATION AND DEBRIDEMENT PERIRECTAL ABSCESS;  Surgeon: Hubbard Robinson, MD;  Location: [facility];  Service: General;  Laterality: N/A;   Family History:  Family History  Problem Relation Age of Onset  . Anxiety disorder Mother   . Diabetes Mother   . Diabetes Father    Family Psychiatric  History: See H&P. Social History:  History  Alcohol Use  . 33.6 oz/week  . 40 Cans of beer per week    Comment: 2 x 40's daily - pt says no alcohol for several weeks     History  Drug Use No    Comment: back in the days    Social History   Social History  . Marital status: Single    Spouse name: N/A  . Number of children: N/A  . Years of education: N/A   Social History Main Topics  . Smoking status: Current Every Day Smoker    Packs/day: 0.10    Types: Cigarettes  . Smokeless tobacco: Never Used     Comment: (1-2 cigs/day)  . Alcohol use 33.6 oz/week    56 Cans of beer per week     Comment: 2 x 40's daily -  pt says no alcohol for several weeks  . Drug use: No     Comment: back in the days  . Sexual activity: Not Asked   Other Topics Concern  . None   Social History Narrative  . None   Additional Social History:                         Sleep: Fair  Appetite:  Poor  Current Medications: Current Facility-Administered Medications  Medication Dose Route Frequency Provider Last Rate Last Dose  . acetaminophen (TYLENOL)  tablet 650 mg  650 mg Oral Q6H PRN Gonzella Lex, MD      . alum & mag hydroxide-simeth (MAALOX/MYLANTA) 200-200-20 MG/5ML suspension 30 mL  30 mL Oral Q4H PRN Gonzella Lex, MD      . aspirin EC tablet 81 mg  81 mg Oral Daily Theodoro Grist, MD   81 mg at 04/15/16 0858  . benztropine (COGENTIN) tablet 0.5 mg  0.5 mg Oral QHS Rainey Pines, MD   0.5 mg at 04/14/16 2111  . chlorpheniramine-HYDROcodone (TUSSIONEX) 10-8 MG/5ML suspension 5 mL  5 mL Oral Q12H Theodoro Grist, MD   5 mL at 04/15/16 0859  . escitalopram (LEXAPRO) tablet 5 mg  5 mg Oral q morning - 10a Rainey Pines, MD   5 mg at 04/15/16 0858  . famotidine (PEPCID) tablet 20 mg  20 mg Oral BID Gonzella Lex, MD   20 mg at 04/15/16 0858  . guaiFENesin (MUCINEX) 12 hr tablet 600 mg  600 mg Oral BID Theodoro Grist, MD   600 mg at 04/15/16 0858  . haloperidol (HALDOL) tablet 5 mg  5 mg Oral QHS Gonzella Lex, MD   5 mg at 04/14/16 2110  . hydrOXYzine (ATARAX/VISTARIL) tablet 25 mg  25 mg Oral TID PRN Gonzella Lex, MD   25 mg at 04/14/16 1711  . ipratropium-albuterol (DUONEB) 0.5-2.5 (3) MG/3ML nebulizer solution 3 mL  3 mL Nebulization Q6H PRN Theodoro Grist, MD      . levofloxacin (LEVAQUIN) tablet 500 mg  500 mg Oral q1800 Theodoro Grist, MD      . levothyroxine (SYNTHROID, LEVOTHROID) tablet 50 mcg  50 mcg Oral QAC breakfast Gonzella Lex, MD   50 mcg at 04/15/16 0630  . magnesium hydroxide (MILK OF MAGNESIA) suspension 30 mL  30 mL Oral Daily PRN Gonzella Lex, MD      . nicotine (NICODERM CQ - dosed in mg/24 hours) patch 21 mg  21 mg Transdermal Daily Theodoro Grist, MD      . nitroGLYCERIN (NITROGLYN) 2 % ointment 0.5 inch  0.5 inch Topical Q8H Theodoro Grist, MD   0.5 inch at 04/15/16 0630  . pantoprazole (PROTONIX) EC tablet 40 mg  40 mg Oral BID Theodoro Grist, MD   40 mg at 04/15/16 0858  . traZODone (DESYREL) tablet 50 mg  50 mg Oral QHS Gonzella Lex, MD   50 mg at 04/14/16 2110    Lab Results:  Results for orders placed or  performed during the hospital encounter of 04/10/16 (from the past 48 hour(s))  Troponin I     Status: None   Collection Time: 04/14/16  5:48 PM  Result Value Ref Range   Troponin I <0.03 <0.03 ng/mL  Lipase, blood     Status: None   Collection Time: 04/14/16  5:48 PM  Result Value Ref Range   Lipase 30 11 - 51 U/L  Culture, expectorated sputum-assessment     Status: None   Collection Time: 04/14/16  7:26 PM  Result Value Ref Range   Specimen Description EXPECTORATED SPUTUM    Special Requests NONE    Sputum evaluation      Sputum specimen not acceptable for testing.  Please recollect.   C/MICHELLE STEFFENS AT 2158 04/14/16.PMH   Report Status 04/14/2016 FINAL   Urine Drug Screen, Qualitative ([place] only)     Status: Abnormal   Collection Time: 04/15/16  6:30 AM  Result Value Ref Range   Tricyclic, Ur Screen NONE DETECTED NONE DETECTED   Amphetamines, Ur Screen NONE DETECTED NONE DETECTED   MDMA (Ecstasy)Ur Screen NONE DETECTED NONE DETECTED   Cocaine Metabolite,Ur [place] NONE DETECTED NONE DETECTED   Opiate, Ur Screen POSITIVE (A) NONE DETECTED   Phencyclidine (PCP) Ur S NONE DETECTED NONE DETECTED   Cannabinoid 50 Ng, Ur [place] NONE DETECTED NONE DETECTED   Barbiturates, Ur Screen NONE DETECTED NONE DETECTED   Benzodiazepine, Ur Scrn NONE DETECTED NONE DETECTED   Methadone Scn, Ur NONE DETECTED NONE DETECTED    Comment: (NOTE) 448  Tricyclics, urine               Cutoff 1000 ng/mL 200  Amphetamines, urine             Cutoff 1000 ng/mL 300  MDMA (Ecstasy), urine           Cutoff 500 ng/mL 400  Cocaine Metabolite, urine       Cutoff 300 ng/mL 500  Opiate, urine                   Cutoff 300 ng/mL 600  Phencyclidine (PCP), urine      Cutoff 25 ng/mL 700  Cannabinoid, urine              Cutoff 50 ng/mL 800  Barbiturates, urine             Cutoff 200 ng/mL 900  Benzodiazepine, urine           Cutoff 200 ng/mL 1000 Methadone, urine                Cutoff 300 ng/mL 1100 1200 The urine drug  screen provides only a preliminary, unconfirmed 1300 analytical test result and should not be used for non-medical 1400 purposes. Clinical consideration and professional judgment should 1500 be applied to any positive drug screen result due to possible 1600 interfering substances. A more specific alternate chemical method 1700 must be used in order to obtain a confirmed analytical result.  1800 Gas chromato graphy / mass spectrometry (GC/MS) is the preferred 1900 confirmatory method.   Troponin I (q 6hr x 3)     Status: Abnormal   Collection Time: 04/15/16  9:19 AM  Result Value Ref Range   Troponin I 0.03 (HH) <0.03 ng/mL    Comment: CRITICAL RESULT CALLED TO, READ BACK BY AND VERIFIED WITH MANDY STONE AT 1037 04/15/16 DAS     Blood Alcohol level:  Lab Results  Component Value Date   ETH 61 (H) 04/09/2016   ETH 133 (H) 18/56/3149    Metabolic Disorder Labs: Lab Results  Component Value Date   HGBA1C 5.5 04/11/2016   MPG 111 04/11/2016   No results found for: PROLACTIN Lab Results  Component Value Date   CHOL 162 04/11/2016   TRIG 75 04/11/2016   HDL 62 04/11/2016   CHOLHDL 2.6 04/11/2016   VLDL 15 04/11/2016  [place] 85 04/11/2016   LDLCALC 76 01/27/2014    Physical Findings: AIMS:  , ,  ,  ,    CIWA:    COWS:     Musculoskeletal: Strength & Muscle Tone: within normal limits Gait & Station: normal Patient leans: N/A  Psychiatric Specialty Exam: Physical Exam  Nursing note and vitals reviewed.   Review of Systems  Cardiovascular: Positive for chest pain.  Psychiatric/Behavioral: Positive for depression, substance abuse and suicidal ideas.  All other systems reviewed and are negative.   Blood pressure 105/62, pulse 68, temperature 97.9 F (36.6 C), temperature source Oral, resp. rate 18, height _0  (1.6 m), weight 63 kg (139 lb), SpO2 100 %.Body mass index is 24.62 kg/m.  General Appearance: Casual  Eye Contact:  Good  Speech:  Clear and Coherent   Volume:  Normal  Mood:  Anxious, Hopeless and Worthless  Affect:  Blunt  Thought Process:  Goal Directed and Descriptions of Associations: Intact  Orientation:  Full (Time, Place, and Person)  Thought Content:  Hallucinations: Auditory  Suicidal Thoughts:  Yes.  with intent/plan  Homicidal Thoughts:  No  Memory:  Immediate;   Fair Recent;   Fair Remote;   Fair  Judgement:  Poor  Insight:  Lacking  Psychomotor Activity:  Normal  Concentration:  Concentration: Fair and Attention Span: Fair  Recall:  [employer] of Knowledge:  Fair  Language:  Fair  Akathisia:  No  Handed:  Right  AIMS (if indicated):     Assets:  Communication Skills Desire for Improvement Financial Resources/Insurance Housing Physical Health Resilience Social Support  ADL's:  Intact  Cognition:  WNL  Sleep:  Number of Hours: 8.75     Treatment Plan Summary: Daily contact with patient to assess and evaluate symptoms and progress in treatment and Medication management   Mr. Yoho is a 55 year old male with history of schizophrenia and substance use admitted for worsening of psychosis, suicidal or homicidal ideation, and substance use in the context of treatment noncompliance.  1. Suicidal and homicidal ideation. This has resolved. The patient is able to contract for safety. He is forward thinking and optimistic about the future.   2. Psychosis. He was restarted on Haldol and Cogentin for psychosis. We offered Haldol decanoate injection to improve compliance but the patient refused. We discontinued Lexapro.   3. Insomnia. Trazodone is available.  4. GERD. He is on Pepcid.  5. Metabolic syndrome monitoring. Lipid profile, TSH, and hemoglobin A1c are normal.   6. Chest pain. Medicine input is greatly appreciated. Troponin this am is elevated. He was started on ASA and Nitro for chest pain and Levaquin for pneumonia along with Duoneb, Mucinex and Tussionex.    7. Hypothyroidism. He is on  Synthroid.  8. Substance abuse. The patient denies heavy drinking. There were no symptoms of alcohol withdrawal.   9. Substance abuse treatment. The patient minimizes his problems and declines residential treatment but is interested in SA IOP. He met with Sherrian Divers of [facility] today.   10. Weight loss. The patient requested double portions.   11. Disposition. He will be discharged back to his boarding house. He will follow up with [facility].  Orson Slick, MD 04/15/2016, 11:34 AM

## 2016-04-15 NOTE — Progress Notes (Signed)
Pt went outside for group, played basketball for 1 hour without complaints. Denies pain at this time. Safety maintained. Will continue to monitor.

## 2016-04-15 NOTE — Discharge Summary (Signed)
Physician Discharge Summary Note  Patient:  Charles Charles is an 55 y.o., male MRN:  [id number] DOB:  October 02, 1960 Patient phone:  [phone] (home)  Patient address:   [address] [place] [address],  Total Time spent with patient: 30 minutes  Date of Admission:  04/10/2016 Date of Discharge: 04/15/2016  Reason for Admission:  Suicidal ideation.  Identifying data. Charles Charles is a 55 year old male with a history of schizophrenia and substance abuse.  Chief complaint. "I hear voices."  History of present illness. Information was obtained from the patient and the chart. The patient has long and well-documented history of schizophrenia. He responds well to Haldol but for the past 7 months he claims not to be compliant. In the past few days he started developing auditory hallucinations commanding him to kill himself and others. He became suicidal and came to the emergency room. He reports some symptoms of depression with poor sleep, decreased appetite and weight loss, anhedonia, feeling of guilt, social isolation, and heightened anxiety. She started drinking beer and took some cocaine prior to admission. He denies heavy alcohol intake or heavy drug use.  Past psychiatric history. There is a long history of schizophrenia with multiple hospitalizations including at [facility]. There is also history of substance use. The patient denies attempting suicide.  Family psychiatric history. Nonreported.  Social history. He is disabled from mental illness. He lives in an apartment now. He had 7 children. One of them died on 2023-04-28 years ago but  It still makes him sad. His elderly mother is his only relatives and support but they have a very difficult relationship and argued frequently. Of note and the patient was seen at [facility] family practice on 9/25 and apparently was prescribed his Haldol and Cogentin there.  Principal Problem: Undifferentiated schizophrenia [facility]) Discharge  Diagnoses: Patient Active Problem List   Diagnosis Date Noted  . Chest pain [R07.9] 04/14/2016  . Abdominal pain, epigastric [R10.13] 04/14/2016  . Undifferentiated schizophrenia ([place]) [F20.3] 04/10/2016  . Alcohol use disorder, moderate, dependence ([place]) [F10.20] 04/10/2016  . GERD (gastroesophageal reflux disease) [K21.9] 04/10/2016  . Tobacco use disorder [F17.200] 04/10/2016  . Suicidal ideation [R45.851] 04/09/2016  . Stimulant abuse [F15.10] 12/14/2014  . Cocaine use disorder, moderate, dependence ([place]) [F14.20] 12/17/2013  . Adult hypothyroidism [E03.9] 12/17/2013  . 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 Blurred vision [H53.8] 12/17/2013    Past Medical History:  Past Medical History:  Diagnosis Date  . GERD (gastroesophageal reflux disease)   . Headache   . Schizo-affective schizophrenia [facility])     Past Surgical History:  Procedure Laterality Date  . COLONOSCOPY WITH PROPOFOL N/A 08/04/2015   Procedure: COLONOSCOPY WITH PROPOFOL;  Surgeon: Lucilla Lame, MD;  Location: [place];  Service: Endoscopy;  Laterality: N/A;  . INCISION AND DRAINAGE PERIRECTAL ABSCESS N/A 07/14/2015   Procedure: IRRIGATION AND DEBRIDEMENT PERIRECTAL ABSCESS;  Surgeon: Florene Glen, MD;  Location: [facility] ORS;  Service: General;  Laterality: N/A;  . INCISION AND DRAINAGE PERIRECTAL ABSCESS N/A 08/14/2015   Procedure: IRRIGATION AND DEBRIDEMENT PERIRECTAL ABSCESS;  Surgeon: Hubbard Robinson, MD;  Location: [facility] ORS;  Service: General;  Laterality: N/A;   Family History:  Family History  Problem Relation Age of Onset  . Anxiety disorder Mother   . Diabetes Mother   . Diabetes Father    Social History:  History  Alcohol Use  . 33.6 oz/week  . 56 Cans of beer per week    Comment: 2  x 40's daily - pt says no alcohol for several weeks     History  Drug Use No    Comment: back in the days    Social History   Social History  . Marital status: Single    Spouse  name: N/A  . Number of children: N/A  . Years of education: N/A   Social History Main Topics  . Smoking status: Current Every Day Smoker    Packs/day: 0.10    Types: Cigarettes  . Smokeless tobacco: Never Used     Comment: (1-2 cigs/day)  . Alcohol use 33.6 oz/week    56 Cans of beer per week     Comment: 2 x 40's daily - pt says no alcohol for several weeks  . Drug use: No     Comment: back in the days  . Sexual activity: Not Asked   Other Topics Concern  . None   Social History Narrative  . None    Hospital Course:    Charles Charles is a 55 year old male with history of schizophrenia and substance use admitted for worsening of psychosis, suicidal or homicidal ideation, and substance use in the context of treatment noncompliance.  1. Suicidal and homicidal ideation. This has resolved. The patient is able to contract for safety. He is forward thinking and optimistic about the future.   2. Psychosis. He was restarted on Haldol and Cogentin for psychosis. We offered Haldol decanoate injection to improve compliance but the patient refused.  3. Insomnia. Trazodone was available.  4. GERD. He is on Pepcid.  5. Metabolic syndrome monitoring. Lipid profile, TSH, and hemoglobin A1c are normal.   6. Chest pain. Medicine input is greatly appreciated. Troponin this am is elevated. He was started on ASA and Nitro for chest pain and Levaquin for pneumonia along with Duoneb, Mucinex and Tussionex.    7. Hypothyroidism. He is on Synthroid.  8. Substance abuse. The patient denies heavy drinking. There were no symptoms of alcohol withdrawal.   9. Substance abuse treatment. The patient minimizes his problems and declines residential treatment but is interested in SA IOP. He met with Sherrian Divers of [facility] today.   10. Weight loss. The patient requested double portions.   11. Disposition. He was discharged back to his boarding house. He will follow up with [facility].  Physical  Findings: AIMS:  , ,  ,  ,    CIWA:    COWS:     Musculoskeletal: Strength & Muscle Tone: within normal limits Gait & Station: normal Patient leans: N/A  Psychiatric Specialty Exam: Physical Exam  Nursing note and vitals reviewed.   Review of Systems  Psychiatric/Behavioral: Positive for substance abuse.  All other systems reviewed and are negative.   Blood pressure 102/61, pulse 62, temperature 97.9 F (36.6 C), temperature source Oral, resp. rate 18, height _0  (1.6 m), weight 63 kg (139 lb), SpO2 100 %.Body mass index is 24.62 kg/m.  See SRA.                                                  Sleep:  Number of Hours: 8.75     Have you used any form of tobacco in the last 30 days? (Cigarettes, Smokeless Tobacco, Cigars, and/or Pipes): Yes  Has this patient used any form of tobacco in the last 30 days? (  Cigarettes, Smokeless Tobacco, Cigars, and/or Pipes) Yes, Yes, A prescription for an FDA-approved tobacco cessation medication was offered at discharge and the patient refused  Blood Alcohol level:  Lab Results  Component Value Date   ETH 61 (H) 04/09/2016   ETH 133 (H) 62/26/3335    Metabolic Disorder Labs:  Lab Results  Component Value Date   HGBA1C 5.5 04/11/2016   MPG 111 04/11/2016   No results found for: PROLACTIN Lab Results  Component Value Date   CHOL 162 04/11/2016   TRIG 75 04/11/2016   HDL 62 04/11/2016   CHOLHDL 2.6 04/11/2016   VLDL 15 04/11/2016   LDLCALC 85 04/11/2016   LDLCALC 76 01/27/2014    See Psychiatric Specialty Exam and Suicide Risk Assessment completed by Attending Physician prior to discharge.  Discharge destination:  Home  Is patient on multiple antipsychotic therapies at discharge:  No   Has Patient had three or more failed trials of antipsychotic monotherapy by history:  No  Recommended Plan for Multiple Antipsychotic Therapies: NA     Medication List    TAKE these medications     Indication   aspirin 81 MG EC tablet Take 1 tablet (81 mg total) by mouth daily. Start taking on:  04/16/2016  Indication:  Heart Attack   benztropine 0.5 MG tablet Commonly known as:  COGENTIN Take 1 tablet (0.5 mg total) by mouth at bedtime. What changed:  medication strength  how much to take  when to take this  Indication:  Extrapyramidal Reaction caused by Medications   famotidine 20 MG tablet Commonly known as:  PEPCID Take 1 tablet (20 mg total) by mouth 2 (two) times daily.  Indication:  Gastroesophageal Reflux Disease   haloperidol 5 MG tablet Commonly known as:  HALDOL Take 1 tablet (5 mg total) by mouth at bedtime.  Indication:  Schizophrenia   ibuprofen 600 MG tablet Commonly known as:  ADVIL,MOTRIN Take 1 tablet (600 mg total) by mouth every 6 (six) hours as needed.  Indication:  Joint Damage causing Pain and Loss of Function   levothyroxine 50 MCG tablet Commonly known as:  SYNTHROID, LEVOTHROID Take 1 tablet (50 mcg total) by mouth daily before breakfast. Start taking on:  04/16/2016  Indication:  Underactive Thyroid   Polyethylene Glycol Powd Take 17 g by mouth daily.  Indication:  constipation   traZODone 50 MG tablet Commonly known as:  DESYREL Take 1 tablet (50 mg total) by mouth at bedtime.  Indication:  [place]. Go on 04/19/2016.   Why:  Please arrive to [place] [facility] for assessment for medication management and therapy. Arrive as early as possible for prompt service. Please call Sherrian Divers at [phone] for questions and assistance. Contact information: Address: [address], [place], [place] [address] Phone: [phone] Fax: [phone]          Follow-up recommendations:  Activity:  As tolerated. Diet:  Low sodium heart healthy. Other:  Keep follow-up appointments.  Comments:    Signed: Orson Slick, MD 04/15/2016, 3:30 PM

## 2016-04-15 NOTE — Progress Notes (Signed)
Sputum sample provided by pt. Sent to lab.

## 2016-04-15 NOTE — Plan of Care (Signed)
Problem: Pain Managment: Goal: General experience of comfort will improve Outcome: Progressing Pt denies chest pain at this time.  Problem: Self-Concept: Goal: Ability to disclose and discuss suicidal ideas will improve Outcome: Progressing Pt affect is bright and appropriate to situation. He denies SI at this time.

## 2016-04-15 NOTE — Progress Notes (Signed)
D: Pt appears slightly anxious this evening d/t episode of chest pain during previous shift. He is pleasant and cooperative upon approach. Pt rates chest pain 4/10 at this time and states that it has gone down. Pt continues to report feeling cold and weak. Cough is noted with minimal phlegm production. VS checked and documented. Rates anxiety 5/10 and depression 4/10. Denies SI/HI/AVH at this time.  A: Emotional support and encouragement provided. Medications administered with education. q15 minute safety checks maintained. R: Pt remains free from harm. Will continue to monitor.

## 2016-04-15 NOTE — [facility] Group Notes (Signed)
[facility] LCSW Group Therapy   04/15/2016 1pm Type of Therapy: Group Therapy   Participation Level: Pt invited but did not attend.  Participation Quality: Pt invited but did not attend.   Hampton AbbotKadijah Hiromi Knodel, MSW, LCSWA 04/15/2016, 1:47PM

## 2016-04-15 NOTE — Progress Notes (Signed)
[facility] [place] [facility]   PATIENT NAME: Charles BridgemanGarry Loewen    MR#:  [id number]  DATE OF BIRTH:  09/09/1960  SUBJECTIVE:  CHIEF COMPLAINT:  Patient is doing fine today. Denies any chest pain or shortness of breath. Denies any palpitation. Played basketball for 1 hour without any complaints as per nurse's report  REVIEW OF SYSTEMS:  CONSTITUTIONAL: No fever, fatigue or weakness.  EYES: No blurred or double vision.  EARS, NOSE, AND THROAT: No tinnitus or ear pain.  RESPIRATORY: No cough, shortness of breath, wheezing or hemoptysis.  CARDIOVASCULAR: No chest pain, orthopnea, edema.  GASTROINTESTINAL: No nausea, vomiting, diarrhea or abdominal pain.  GENITOURINARY: No dysuria, hematuria.  ENDOCRINE: No polyuria, nocturia,  HEMATOLOGY: No anemia, easy bruising or bleeding SKIN: No rash or lesion. MUSCULOSKELETAL: No joint pain or arthritis.   NEUROLOGIC: No tingling, numbness, weakness.  PSYCHIATRY: No anxiety or depression.   DRUG ALLERGIES:   Allergies  Allergen Reactions  . Penicillins Anaphylaxis, Hives and Other (See Comments)    Has patient had a PCN reaction causing immediate rash, facial/tongue/throat swelling, SOB or lightheadedness with hypotension: Yes Has patient had a PCN reaction causing severe rash involving mucus membranes or skin necrosis: No Has patient had a PCN reaction that required hospitalization No Has patient had a PCN reaction occurring within the last 10 years: Yes If all of the above answers are "NO", then may proceed with Cephalosporin use.    VITALS:  Blood pressure 102/61, pulse 62, temperature 97.9 F (36.6 C), temperature source Oral, resp. rate 18, height 5\' 3"  (1.6 m), weight 63 kg (139 lb), SpO2 100 %.  PHYSICAL EXAMINATION:  GENERAL:  55 y.o.-year-old patient lying in the bed with no acute distress.  EYES: Pupils equal, round, reactive to light and accommodation. No scleral icterus. Extraocular muscles intact.   HEENT: Head atraumatic, normocephalic. Oropharynx and nasopharynx clear.  NECK:  Supple, no jugular venous distention. No thyroid enlargement, no tenderness.  LUNGS: Normal breath sounds bilaterally, no wheezing, rales,rhonchi or crepitation. No use of accessory muscles of respiration.  CARDIOVASCULAR: S1, S2 normal. No murmurs, rubs, or gallops.  ABDOMEN: Soft, nontender, nondistended. Bowel sounds present. No organomegaly or mass.  EXTREMITIES: No pedal edema, cyanosis, or clubbing.  NEUROLOGIC: Cranial nerves II through XII are intact. Muscle strength 5/5 in all extremities. Sensation intact. Gait not checked.  PSYCHIATRIC: The patient is alert and oriented x 3.  SKIN: No obvious rash, lesion, or ulcer.    LABORATORY PANEL:   CBC  Recent Labs Lab 04/09/16 1841  WBC 9.2  HGB 15.3  HCT 44.5  PLT 342   ------------------------------------------------------------------------------------------------------------------  Chemistries   Recent Labs Lab 04/09/16 1841  NA 138  K 3.8  CL 104  CO2 22  GLUCOSE 98  BUN 12  CREATININE 0.99  CALCIUM 9.7  AST 23  ALT 16*  ALKPHOS 62  BILITOT 0.3   ------------------------------------------------------------------------------------------------------------------  Cardiac Enzymes  Recent Labs Lab 04/15/16 1424  TROPONINI <0.03   ------------------------------------------------------------------------------------------------------------------  RADIOLOGY:  Dg Chest 1 View  Result Date: 04/14/2016 CLINICAL DATA:  Acute chest pain today. EXAM: CHEST 1 VIEW COMPARISON:  None. FINDINGS: The cardiomediastinal silhouette is unremarkable. There is no evidence of focal airspace disease, pulmonary edema, suspicious pulmonary nodule/mass, pleural effusion, or pneumothorax. No acute bony abnormalities are identified. IMPRESSION: No active disease. Electronically Signed   By: Harmon PierJeffrey  Hu M.D.   On: 04/14/2016 18:36   Dg Abd 1  View  Result Date: 04/14/2016  CLINICAL DATA:  Epigastric abdominal pain. EXAM: ABDOMEN - 1 VIEW COMPARISON:  None FINDINGS: The bowel gas pattern is normal. No evidence of urolithiasis. Prostatic calcifications and right pelvic phlebolith noted. No concerning mass effect. Lung bases are clear. IMPRESSION: Negative. Electronically Signed   By: Marnee Spring M.D.   On: 04/14/2016 18:47    EKG:   Orders placed or performed during the hospital encounter of 04/10/16  . EKG 12-Lead  . EKG 12-Lead  . EKG 12-Lead  . EKG 12-Lead  . EKG 12-Lead  . EKG 12-Lead    ASSESSMENT AND PLAN:   #1. Chest pain- atypical  Ruled out acute MI with 3 sets of negative troponin  initiated patient on low-dose of metoprolol  Myoview scan was not done as the patient was not made nothing by mouth Patient is clinically symptomatic Patient can be discharged from a hospitalist standpoint if echocardiogram is normal Outpatient follow-up with cardiology is recommended  #2. Cocaine abuse, repeat urine drug screen opiate positive  #3.  chronic smoker's cough  chest x-rays negative and patient is afebrile Discontinued levofloxacin   #4. Diffuse abdominal pain, especially in the epigastric region,  Resolved Normal lipase  LFTs were unremarkable. Normal abdominal x-ray   #5. Tobacco abuse, discussed this patient for 3 minutes, nicotine replacement therapy will be initiated  We will sign off. Please contact hospitalist team as needed   All the records are reviewed and case discussed with Care Management/Social Workerr. Management plans discussed with the patient, and his nurse  CODE STATUS: fc  TOTAL TIME TAKING CARE OF THIS PATIENT: 33 minutes.     Note: This dictation was prepared with Dragon dictation along with smaller phrase technology. Any transcriptional errors that result from this process are unintentional.   Ramonita Lab M.D on 04/15/2016 at 5:00 PM  Between 7am to 6pm - Pager -  [phone] After 6pm go to [email or web address] - password [email or web address]  [facility]  Office  [phone]  CC: Primary care physician; [facility]

## 2016-04-15 NOTE — Progress Notes (Signed)
D: Pt affect is much brighter this evening. He is seen laughing and interacting with staff and peers appropriately. He reports a desire to be discharged. Pt denies chest pain at this time and refuses evening nitroglycerin ointment. Denies SI/HI/AVH at this time. A: Emotional support and encouragement provided. Medications administered with education. q15 minute safety checks maintained. R: Pt remains free from harm. Will continue to monitor.

## 2016-04-15 NOTE — Progress Notes (Signed)
Echo lab called. Writer informed that they would come first thing in the morning to perform echo on pt as ordered.

## 2016-04-15 NOTE — [facility] Group Notes (Signed)
[facility] Group Notes:  (Nursing/MHT/Case Management/Adjunct)  Date:  04/15/2016  Time:  3:57 PM  Type of Therapy:  Psychoeducational Skills  Participation Level:  Active  Participation Quality:  Appropriate  Affect:  Appropriate  Cognitive:  Alert and Appropriate  Insight:  Appropriate and Good  Engagement in Group:  Engaged  Modes of Intervention:  Activity and Discussion  Summary of Progress/Problems:  Charles Charles 04/15/2016, 3:57 PM

## 2016-04-15 NOTE — Progress Notes (Signed)
Paged Dr. Winona LegatoVaickute regarding stress test orders on this patient.  Pt has not been NPO, nuclear medicine is requiring 3 trended troponin levels and requests that patient be NPO and without tussinex dose in order for the test to be completed. Will continue to monitor.

## 2016-04-15 NOTE — Progress Notes (Signed)
Awake and alert this morning, out of room for meals and medications, but stays in room otherwise. Reports anxiety regarding discharge asking "if I had not had chest pain, would I have been discharged today?"  Denies chest pain today until around 1405, when pt came to nurses station, he reports middle chest pain mainly when he coughs rated 6/10. VS stable as noted. Nitroglycerin cream applied as ordered. Sputum sent to lab for assessment as ordered. Elevated troponin at 0.03 reported. Appropriate on interaction with staff/peers, cooperative, calm. Does not attend groups today. Denies SI/HI/AVH.   Safety maintained with every 15 minute checks. Support and encouragement provided. Medications administered as ordered with education. Will continue to monitor.

## 2016-04-16 ENCOUNTER — Inpatient Hospital Stay
Admission: RE | Admit: 2016-04-16 | Discharge: 2016-04-16 | Disposition: A | Discharge status: Home / Self Care | Payer: Medicaid Other | Source: Intra-hospital | Attending: Internal Medicine | Admitting: Internal Medicine

## 2016-04-16 ENCOUNTER — Ambulatory Visit: Payer: Medicaid Other

## 2016-04-16 NOTE — Progress Notes (Signed)
D:Patient aware of discharge this shift . Patient returning home . Patient received all belonging locked up . Patient denies  Suicidal  And homicidal ideations  .  A: Writer instructed on discharge criteria  . Informed of Discharge [place], Transitional Record  Suicide Risk Assessment and prescriptions  given to patient. Aware  Of follow up appointment . R: Patient left unit with no questions  Or concerns  With mother

## 2016-04-16 NOTE — Progress Notes (Signed)
*  PRELIMINARY RESULTS* Echocardiogram 2D Echocardiogram has been performed.  Cristela BlueHege, Lloyd Cullinan 04/16/2016, 11:13 AM

## 2016-04-16 NOTE — Discharge Summary (Signed)
Physician Discharge Summary Note  Patient:  Charles Charles is an 55 y.o., male MRN:  [id number] DOB:  October 02, 1960 Patient phone:  [phone] (home)  Patient address:   [address] [place] [address],  Total Time spent with patient: 30 minutes  Date of Admission:  04/10/2016 Date of Discharge: 04/15/2016  Reason for Admission:  Suicidal ideation.  Identifying data. Charles Charles is a 55 year old male with a history of schizophrenia and substance abuse.  Chief complaint. "I hear voices."  History of present illness. Information was obtained from the patient and the chart. The patient has long and well-documented history of schizophrenia. He responds well to Haldol but for the past 7 months he claims not to be compliant. In the past few days he started developing auditory hallucinations commanding him to kill himself and others. He became suicidal and came to the emergency room. He reports some symptoms of depression with poor sleep, decreased appetite and weight loss, anhedonia, feeling of guilt, social isolation, and heightened anxiety. She started drinking beer and took some cocaine prior to admission. He denies heavy alcohol intake or heavy drug use.  Past psychiatric history. There is a long history of schizophrenia with multiple hospitalizations including at [facility]. There is also history of substance use. The patient denies attempting suicide.  Family psychiatric history. Nonreported.  Social history. He is disabled from mental illness. He lives in an apartment now. He had 7 children. One of them died on 2023-04-28 years ago but  It still makes him sad. His elderly mother is his only relatives and support but they have a very difficult relationship and argued frequently. Of note and the patient was seen at [facility] family practice on 9/25 and apparently was prescribed his Haldol and Cogentin there.  Principal Problem: Undifferentiated schizophrenia [facility]) Discharge  Diagnoses: Patient Active Problem List   Diagnosis Date Noted  . Chest pain [R07.9] 04/14/2016  . Abdominal pain, epigastric [R10.13] 04/14/2016  . Undifferentiated schizophrenia ([place]) [F20.3] 04/10/2016  . Alcohol use disorder, moderate, dependence ([place]) [F10.20] 04/10/2016  . GERD (gastroesophageal reflux disease) [K21.9] 04/10/2016  . Tobacco use disorder [F17.200] 04/10/2016  . Suicidal ideation [R45.851] 04/09/2016  . Stimulant abuse [F15.10] 12/14/2014  . Cocaine use disorder, moderate, dependence ([place]) [F14.20] 12/17/2013  . Adult hypothyroidism [E03.9] 12/17/2013  . 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 Blurred vision [H53.8] 12/17/2013    Past Medical History:  Past Medical History:  Diagnosis Date  . GERD (gastroesophageal reflux disease)   . Headache   . Schizo-affective schizophrenia [facility])     Past Surgical History:  Procedure Laterality Date  . COLONOSCOPY WITH PROPOFOL N/A 08/04/2015   Procedure: COLONOSCOPY WITH PROPOFOL;  Surgeon: Lucilla Lame, MD;  Location: [place];  Service: Endoscopy;  Laterality: N/A;  . INCISION AND DRAINAGE PERIRECTAL ABSCESS N/A 07/14/2015   Procedure: IRRIGATION AND DEBRIDEMENT PERIRECTAL ABSCESS;  Surgeon: Florene Glen, MD;  Location: [facility];  Service: General;  Laterality: N/A;  . INCISION AND DRAINAGE PERIRECTAL ABSCESS N/A 08/14/2015   Procedure: IRRIGATION AND DEBRIDEMENT PERIRECTAL ABSCESS;  Surgeon: Hubbard Robinson, MD;  Location: [facility] OR[facility];  Service: General;  Laterality: N/A;   Family History:  Family History  Problem Relation Age of Onset  . Anxiety disorder Mother   . Diabetes Mother   . Diabetes Father    Social History:  History  Alcohol Use  . 33.6 oz/week  . 56 Cans of beer per week    Comment: 2  x 40's daily - pt says no alcohol for several weeks     History  Drug Use No    Comment: back in the days    Social History   Social History  . Marital status: Single    Spouse  name: N/A  . Number of children: N/A  . Years of education: N/A   Social History Main Topics  . Smoking status: Current Every Day Smoker    Packs/day: 0.10    Types: Cigarettes  . Smokeless tobacco: Never Used     Comment: (1-2 cigs/day)  . Alcohol use 33.6 oz/week    56 Cans of beer per week     Comment: 2 x 40's daily - pt says no alcohol for several weeks  . Drug use: No     Comment: back in the days  . Sexual activity: Not Asked   Other Topics Concern  . None   Social History Narrative  . None    Hospital Course:    Charles Charles is a 55 year old male with history of schizophrenia and substance use admitted for worsening of psychosis, suicidal or homicidal ideation, and substance use in the context of treatment noncompliance.  1. Suicidal and homicidal ideation. This has resolved. The patient is able to contract for safety. He is forward thinking and optimistic about the future.   2. Psychosis. He was restarted on Haldol and Cogentin for psychosis. We offered Haldol decanoate injection to improve compliance but the patient refused.  3. Insomnia. Trazodone was available.  4. GERD. He is on Pepcid.  5. Metabolic syndrome monitoring. Lipid profile, TSH, and hemoglobin A1c are normal.   6. Chest pain. Medicine input is greatly appreciated. He was started on ASA and Nitro for chest pain. Cardiac ECHO completed. Results pending. He is to follow up with a cardiologist for stress test.    7. Hypothyroidism. He is on Synthroid.  8. Substance abuse. The patient denies heavy drinking. There were no symptoms of alcohol withdrawal.   9. Substance abuse treatment. The patient minimizes his problems and declines residential treatment but is interested in SA IOP. He met with Sherrian Divers of [place].   10. Weight loss. The patient requested double portions.   11. Disposition. He was discharged back to his boarding house. He will follow up with [facility].  Physical Findings: AIMS:   , ,  ,  ,    CIWA:    COWS:     Musculoskeletal: Strength & Muscle Tone: within normal limits Gait & Station: normal Patient leans: N/A  Psychiatric Specialty Exam: Physical Exam  Nursing note and vitals reviewed.   Review of Systems  Psychiatric/Behavioral: Positive for substance abuse.  All other systems reviewed and are negative.   Blood pressure 102/73, pulse 62, temperature 98.3 F (36.8 C), temperature source Oral, resp. rate 18, height '5\' 3"'$  (1.6 m), weight 63 kg (139 lb), SpO2 100 %.Body mass index is 24.62 kg/m.  See SRA.                                                  Sleep:  Number of Hours: 6.5     Have you used any form of tobacco in the last 30 days? (Cigarettes, Smokeless Tobacco, Cigars, and/or Pipes): Yes  Has this patient used any form of tobacco in the last 30 days? (  Cigarettes, Smokeless Tobacco, Cigars, and/or Pipes) Yes, Yes, A prescription for an FDA-approved tobacco cessation medication was offered at discharge and the patient refused  Blood Alcohol level:  Lab Results  Component Value Date   ETH 61 (H) 04/09/2016   ETH 133 (H) 00/93/8182    Metabolic Disorder Labs:  Lab Results  Component Value Date   HGBA1C 5.5 04/11/2016   MPG 111 04/11/2016   No results found for: PROLACTIN Lab Results  Component Value Date   CHOL 162 04/11/2016   TRIG 75 04/11/2016   HDL 62 04/11/2016   CHOLHDL 2.6 04/11/2016   VLDL 15 04/11/2016   LDLCALC 85 04/11/2016   LDLCALC 76 01/27/2014    See Psychiatric Specialty Exam and Suicide Risk Assessment completed by Attending Physician prior to discharge.  Discharge destination:  Home  Is patient on multiple antipsychotic therapies at discharge:  No   Has Patient had three or more failed trials of antipsychotic monotherapy by history:  No  Recommended Plan for Multiple Antipsychotic Therapies: NA  Discharge Instructions    Diet - low sodium heart healthy    Complete by:  As  directed    Increase activity slowly    Complete by:  As directed        Medication List    TAKE these medications     Indication  aspirin 81 MG EC tablet Take 1 tablet (81 mg total) by mouth daily.  Indication:  Heart Attack   benztropine 0.5 MG tablet Commonly known as:  COGENTIN Take 1 tablet (0.5 mg total) by mouth at bedtime. What changed:  medication strength  how much to take  when to take this  Indication:  Extrapyramidal Reaction caused by Medications   famotidine 20 MG tablet Commonly known as:  PEPCID Take 1 tablet (20 mg total) by mouth 2 (two) times daily.  Indication:  Gastroesophageal Reflux Disease   haloperidol 5 MG tablet Commonly known as:  HALDOL Take 1 tablet (5 mg total) by mouth at bedtime.  Indication:  Schizophrenia   ibuprofen 600 MG tablet Commonly known as:  ADVIL,MOTRIN Take 1 tablet (600 mg total) by mouth every 6 (six) hours as needed.  Indication:  Joint Damage causing Pain and Loss of Function   levothyroxine 50 MCG tablet Commonly known as:  SYNTHROID, LEVOTHROID Take 1 tablet (50 mcg total) by mouth daily before breakfast.  Indication:  Underactive Thyroid   Polyethylene Glycol Powd Take 17 g by mouth daily.  Indication:  constipation   traZODone 50 MG tablet Commonly known as:  DESYREL Take 1 tablet (50 mg total) by mouth at bedtime.  Indication:  [place]. Go on 04/19/2016.   Why:  Please arrive to [place] at [facility] for assessment for medication management and therapy. Arrive as early as possible for prompt service. Please call Sherrian Divers at [phone] for questions and assistance. Contact information: Address: [address], [place], [place] [address] Phone: [phone] Fax: [phone]          Follow-up recommendations:  Activity:  As tolerated. Diet:  Low sodium heart healthy. Other:  Keep follow-up  appointments.  Comments:    Signed: Orson Slick, MD 04/16/2016, 11:36 AM

## 2016-04-16 NOTE — [facility] Suicide Risk Assessment (Signed)
[facility]   Principal Problem: Undifferentiated schizophrenia [facility](HCC) Discharge Diagnoses:  Patient Active Problem List   Diagnosis Date Noted  . Chest pain [R07.9] 04/14/2016  . Abdominal pain, epigastric [R10.13] 04/14/2016  . Undifferentiated schizophrenia (HCC) [F20.3] 04/10/2016  . Alcohol use disorder, moderate, dependence (HCC) [F10.20] 04/10/2016  . GERD (gastroesophageal reflux disease) [K21.9] 04/10/2016  . Tobacco use disorder [F17.200] 04/10/2016  . Suicidal ideation [R45.851] 04/09/2016  . Stimulant abuse [F15.10] 12/14/2014  . Cocaine use disorder, moderate, dependence (HCC) [F14.20] 12/17/2013  . Adult hypothyroidism [E03.9] 12/17/2013  . 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 Blurred vision [H53.8] 12/17/2013    Total Time spent with patient: 30 minutes  Musculoskeletal: Strength & Muscle Tone: within normal limits Gait & Station: normal Patient leans: N/A  Psychiatric Specialty Exam: Review of Systems  All other systems reviewed and are negative.   Blood pressure 102/73, pulse 62, temperature 98.3 F (36.8 C), temperature source Oral, resp. rate 18, height 5\' 3"  (1.6 m), weight 63 kg (139 lb), SpO2 100 %.Body mass index is 24.62 kg/m.  General Appearance: Casual  Eye Contact::  Good  Speech:  Clear and Coherent409  Volume:  Normal  Mood:  Euthymic  Affect:  Appropriate  Thought Process:  Goal Directed and Descriptions of Associations: Intact  Orientation:  Full (Time, Place, and Person)  Thought Content:  WDL  Suicidal Thoughts:  No  Homicidal Thoughts:  No  Memory:  Immediate;   Fair Recent;   Fair Remote;   Fair  Judgement:  Impaired  Insight:  Present  Psychomotor Activity:  Normal  Concentration:  Fair  Recall:  FiservFair  Fund of Knowledge:Fair  Language: Fair  Akathisia:  No  Handed:  Right  AIMS (if indicated):     Assets:  Communication Skills Desire for Improvement Financial  Resources/Insurance Housing Resilience Social Support  Sleep:  Number of Hours: 6.5  Cognition: WNL  ADL's:  Intact   Mental Status Per Nursing Assessment::   On Admission:     Demographic Factors:  Male  Loss Factors: NA  Historical Factors: Prior suicide attempts and Impulsivity  Risk Reduction Factors:   Sense of responsibility to family, Living with another person, especially a relative and Positive social support  Continued Clinical Symptoms:  Alcohol/Substance Abuse/Dependencies Schizophrenia:   Depressive state Paranoid or undifferentiated type  Cognitive Features That Contribute To Risk:  None    Suicide Risk:  Minimal: No identifiable suicidal ideation.  Patients presenting with no risk factors but with morbid ruminations; may be classified as minimal risk based on the severity of the depressive symptoms  Follow-up Information    [facility]. Go on 04/19/2016.   Why:  [facility] for assessment for medication management and therapy. Arrive as early as possible for prompt service. Please call Unk PintoHarvey Bryant at [phone] for questions and assistance. Contact information: Address: [address], [place], [place] [address] Phone: [phone] Fax: [phone]          Plan Of Care/Follow-up recommendations:  Activity:  as tolerated. Diet:  low sodium heart healthy. Other:  keep follow up appointments.  Kristine LineaJolanta Ryeleigh Santore, MD 04/16/2016, 8:07 AM

## 2016-04-16 NOTE — Progress Notes (Signed)
Recreation Therapy Notes  Date: 10.17.17 Time: 9:30 am Location: Craft Room  Group Topic: Coping Skills  Goal Area(s) Addresses:  Patient will identify things they are grateful for. Patient will identify how being grateful can influence decision making.  Behavioral Response: Attentive  Intervention: LexicographerGrateful Wheel  Activity: Patients were given an I Am Grateful For worksheet and instructed to write 2-3 things they are grateful for under each category.  Education: LRT educated patients on why it is important to be grateful.  Education Outcome: Acknowledges education/In group clarification offered  Clinical Observations/Feedback: Patient completed activity by writing things he was grateful for with assistance from LRT. Patient did not contribute to group discussion.  Jacquelynn CreeGreene,Bernice Mcauliffe M, LRT/CTRS 04/16/2016 10:12 AM

## 2016-04-16 NOTE — Discharge Summary (Signed)
Physician Discharge Summary Note  Patient:  Charles Charles is an 55 y.o., male MRN:  [id number] DOB:  October 02, 1960 Patient phone:  [phone] (home)  Patient address:   [address] [place] [address],  Total Time spent with patient: 30 minutes  Date of Admission:  04/10/2016 Date of Discharge: 04/15/2016  Reason for Admission:  Suicidal ideation.  Identifying data. Charles Charles is a 55 year old male with a history of schizophrenia and substance abuse.  Chief complaint. "I hear voices."  History of present illness. Information was obtained from the patient and the chart. The patient has long and well-documented history of schizophrenia. He responds well to Haldol but for the past 7 months he claims not to be compliant. In the past few days he started developing auditory hallucinations commanding him to kill himself and others. He became suicidal and came to the emergency room. He reports some symptoms of depression with poor sleep, decreased appetite and weight loss, anhedonia, feeling of guilt, social isolation, and heightened anxiety. She started drinking beer and took some cocaine prior to admission. He denies heavy alcohol intake or heavy drug use.  Past psychiatric history. There is a long history of schizophrenia with multiple hospitalizations including at [facility]. There is also history of substance use. The patient denies attempting suicide.  Family psychiatric history. Nonreported.  Social history. He is disabled from mental illness. He lives in an apartment now. He had 7 children. One of them died on 2023-04-28 years ago but  It still makes him sad. His elderly mother is his only relatives and support but they have a very difficult relationship and argued frequently. Of note and the patient was seen at [facility] family practice on 9/25 and apparently was prescribed his Haldol and Cogentin there.  Principal Problem: Undifferentiated schizophrenia [facility]) Discharge  Diagnoses: Patient Active Problem List   Diagnosis Date Noted  . Chest pain [R07.9] 04/14/2016  . Abdominal pain, epigastric [R10.13] 04/14/2016  . Undifferentiated schizophrenia ([place]) [F20.3] 04/10/2016  . Alcohol use disorder, moderate, dependence ([place]) [F10.20] 04/10/2016  . GERD (gastroesophageal reflux disease) [K21.9] 04/10/2016  . Tobacco use disorder [F17.200] 04/10/2016  . Suicidal ideation [R45.851] 04/09/2016  . Stimulant abuse [F15.10] 12/14/2014  . Cocaine use disorder, moderate, dependence ([place]) [F14.20] 12/17/2013  . Adult hypothyroidism [E03.9] 12/17/2013  . 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 Blurred vision [H53.8] 12/17/2013    Past Medical History:  Past Medical History:  Diagnosis Date  . GERD (gastroesophageal reflux disease)   . Headache   . Schizo-affective schizophrenia [facility])     Past Surgical History:  Procedure Laterality Date  . COLONOSCOPY WITH PROPOFOL N/A 08/04/2015   Procedure: COLONOSCOPY WITH PROPOFOL;  Surgeon: Lucilla Lame, MD;  Location: [place];  Service: Endoscopy;  Laterality: N/A;  . INCISION AND DRAINAGE PERIRECTAL ABSCESS N/A 07/14/2015   Procedure: IRRIGATION AND DEBRIDEMENT PERIRECTAL ABSCESS;  Surgeon: Florene Glen, MD;  Location: [facility];  Service: General;  Laterality: N/A;  . INCISION AND DRAINAGE PERIRECTAL ABSCESS N/A 08/14/2015   Procedure: IRRIGATION AND DEBRIDEMENT PERIRECTAL ABSCESS;  Surgeon: Hubbard Robinson, MD;  Location: [facility];  Service: General;  Laterality: N/A;   Family History:  Family History  Problem Relation Age of Onset  . Anxiety disorder Mother   . Diabetes Mother   . Diabetes Father    Social History:  History  Alcohol Use  . 33.6 oz/week  . 56 Cans of beer per week    Comment: 2  x 40's daily - pt says no alcohol for several weeks     History  Drug Use No    Comment: back in the days    Social History   Social History  . Marital status: Single    Spouse  name: N/A  . Number of children: N/A  . Years of education: N/A   Social History Main Topics  . Smoking status: Current Every Day Smoker    Packs/day: 0.10    Types: Cigarettes  . Smokeless tobacco: Never Used     Comment: (1-2 cigs/day)  . Alcohol use 33.6 oz/week    56 Cans of beer per week     Comment: 2 x 40's daily - pt says no alcohol for several weeks  . Drug use: No     Comment: back in the days  . Sexual activity: Not Asked   Other Topics Concern  . None   Social History Narrative  . None    Hospital Course:    Charles Charles is a 55 year old male with history of schizophrenia and substance use admitted for worsening of psychosis, suicidal or homicidal ideation, and substance use in the context of treatment noncompliance.  1. Suicidal and homicidal ideation. This has resolved. The patient is able to contract for safety. He is forward thinking and optimistic about the future.   2. Psychosis. He was restarted on Haldol and Cogentin for psychosis. We offered Haldol decanoate injection to improve compliance but the patient refused.  3. Insomnia. Trazodone was available.  4. GERD. He is on Pepcid.  5. Metabolic syndrome monitoring. Lipid profile, TSH, and hemoglobin A1c are normal.   6. Chest pain. Medicine input is greatly appreciated. He was started on ASA and Nitro for chest pain. Cardiac ECHO pending. He is to follow up with cardiologist.     7. Hypothyroidism. He is on Synthroid.  8. Substance abuse. The patient denies heavy drinking. There were no symptoms of alcohol withdrawal.   9. Substance abuse treatment. The patient minimizes his problems and declines residential treatment but is interested in SA IOP. He met with Sherrian Divers of [place].   10. Weight loss. The patient requested double portions.   11. Disposition. He was discharged back to his boarding house. He will follow up with [facility].  Physical Findings: AIMS:  , ,  ,  ,    CIWA:    COWS:      Musculoskeletal: Strength & Muscle Tone: within normal limits Gait & Station: normal Patient leans: N/A  Psychiatric Specialty Exam: Physical Exam  Nursing note and vitals reviewed.   Review of Systems  Psychiatric/Behavioral: Positive for substance abuse.  All other systems reviewed and are negative.   Blood pressure 102/73, pulse 62, temperature 98.3 F (36.8 C), temperature source Oral, resp. rate 18, height _0  (1.6 m), weight 63 kg (139 lb), SpO2 100 %.Body mass index is 24.62 kg/m.  See SRA.                                                  Sleep:  Number of Hours: 6.5     Have you used any form of tobacco in the last 30 days? (Cigarettes, Smokeless Tobacco, Cigars, and/or Pipes): Yes  Has this patient used any form of tobacco in the last 30 days? (Cigarettes, Smokeless Tobacco, Cigars, and/or  Pipes) Yes, Yes, A prescription for an FDA-approved tobacco cessation medication was offered at discharge and the patient refused  Blood Alcohol level:  Lab Results  Component Value Date   ETH 61 (H) 04/09/2016   ETH 133 (H) 26/37/8588    Metabolic Disorder Labs:  Lab Results  Component Value Date   HGBA1C 5.5 04/11/2016   MPG 111 04/11/2016   No results found for: PROLACTIN Lab Results  Component Value Date   CHOL 162 04/11/2016   TRIG 75 04/11/2016   HDL 62 04/11/2016   CHOLHDL 2.6 04/11/2016   VLDL 15 04/11/2016   LDLCALC 85 04/11/2016   LDLCALC 76 01/27/2014    See Psychiatric Specialty Exam and Suicide Risk Assessment completed by Attending Physician prior to discharge.  Discharge destination:  Home  Is patient on multiple antipsychotic therapies at discharge:  No   Has Patient had three or more failed trials of antipsychotic monotherapy by history:  No  Recommended Plan for Multiple Antipsychotic Therapies: NA     Medication List    TAKE these medications     Indication  aspirin 81 MG EC tablet Take 1 tablet (81 mg total)  by mouth daily.  Indication:  Heart Attack   benztropine 0.5 MG tablet Commonly known as:  COGENTIN Take 1 tablet (0.5 mg total) by mouth at bedtime. What changed:  medication strength  how much to take  when to take this  Indication:  Extrapyramidal Reaction caused by Medications   famotidine 20 MG tablet Commonly known as:  PEPCID Take 1 tablet (20 mg total) by mouth 2 (two) times daily.  Indication:  Gastroesophageal Reflux Disease   haloperidol 5 MG tablet Commonly known as:  HALDOL Take 1 tablet (5 mg total) by mouth at bedtime.  Indication:  Schizophrenia   ibuprofen 600 MG tablet Commonly known as:  ADVIL,MOTRIN Take 1 tablet (600 mg total) by mouth every 6 (six) hours as needed.  Indication:  Joint Damage causing Pain and Loss of Function   levothyroxine 50 MCG tablet Commonly known as:  SYNTHROID, LEVOTHROID Take 1 tablet (50 mcg total) by mouth daily before breakfast.  Indication:  Underactive Thyroid   Polyethylene Glycol Powd Take 17 g by mouth daily.  Indication:  constipation   traZODone 50 MG tablet Commonly known as:  DESYREL Take 1 tablet (50 mg total) by mouth at bedtime.  Indication:  [place]. Go on 04/19/2016.   Why:  Please arrive to [place] at [facility] for assessment for medication management and therapy. Arrive as early as possible for prompt service. Please call Sherrian Divers at [phone] for questions and assistance. Contact information: Address: [address], [place], [place] [address] Phone: [phone] Fax: [phone]          Follow-up recommendations:  Activity:  As tolerated. Diet:  Low sodium heart healthy. Other:  Keep follow-up appointments.  Comments:    Signed: Orson Slick, MD 04/16/2016, 10:06 AM

## 2016-04-16 NOTE — Tx Team (Signed)
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 acetaminophen (TYLENOL) tablet 650 mg  650 mg Oral Q6H PRN Audery AmelJohn T Clapacs, MD      . alum & mag hydroxide-simeth (MAALOX/MYLANTA) 200-200-20 MG/5ML suspension 30 mL  30 mL Oral Q4H PRN Audery AmelJohn T Clapacs, MD      . aspirin EC tablet 81 mg  81 mg Oral Daily Katharina Caperima Vaickute, MD   81 mg at 04/16/16 0845  . benztropine (COGENTIN) tablet 0.5 mg  0.5 mg Oral QHS Brandy HaleUzma Faheem, MD   0.5 mg at 04/15/16 2129  . chlorpheniramine-HYDROcodone (TUSSIONEX) 10-8 MG/5ML suspension 5 mL  5 mL Oral Q12H Katharina Caperima Vaickute, MD   5 mL at 04/16/16 0845  . famotidine (PEPCID) tablet 20 mg  20 mg Oral BID Audery AmelJohn T Clapacs, MD   20 mg at 04/16/16 0846  . guaiFENesin (MUCINEX) 12 hr tablet 600 mg  600 mg Oral BID Katharina Caperima Vaickute, MD   600 mg at 04/16/16 0845  . haloperidol (HALDOL) tablet 5 mg  5 mg Oral QHS Audery AmelJohn T Clapacs, MD   5 mg at 04/15/16 2129  . hydrOXYzine (ATARAX/VISTARIL) tablet 25 mg  25 mg Oral TID PRN Audery AmelJohn T Clapacs, MD   25 mg at 04/14/16 1711  . ipratropium-albuterol (DUONEB) 0.5-2.5 (3) MG/3ML nebulizer solution 3 mL  3 mL Nebulization Q6H PRN Katharina Caperima Vaickute, MD      . levothyroxine (SYNTHROID, LEVOTHROID) tablet 50 mcg  50 mcg Oral QAC breakfast Audery AmelJohn T Clapacs, MD   50 mcg at 04/16/16 0846  . magnesium hydroxide (MILK OF MAGNESIA) suspension 30 mL  30 mL Oral  Daily PRN Audery AmelJohn T Clapacs, MD      . nicotine (NICODERM CQ - dosed in mg/24 hours) patch 21 mg  21 mg Transdermal Daily Katharina Caperima Vaickute, MD      . nitroGLYCERIN (NITROGLYN) 2 % ointment 0.5 inch  0.5 inch Topical Q8H Katharina Caperima Vaickute, MD   0.5 inch at 04/15/16 1407  . pantoprazole (PROTONIX) EC tablet 40 mg  40 mg Oral BID Katharina Caperima Vaickute, MD   40 mg at 04/16/16 0846  . traZODone (DESYREL) tablet 50 mg  50 mg Oral QHS Audery AmelJohn T Clapacs, MD   50 mg at 04/15/16 2129   PTA Medications: Prescriptions Prior to Admission  Medication Sig Dispense Refill Last Dose  . benztropine (COGENTIN) 1 MG tablet Take 1 tablet (1 mg total) by mouth daily. 30 tablet 2   . famotidine (PEPCID) 20 MG tablet Take 1 tablet (20 mg total) by mouth 2 (two) times daily. 60 tablet 1   . ibuprofen (ADVIL,MOTRIN) 600 MG tablet Take 1 tablet (600 mg total) by mouth every 6 (six) hours as needed. 30 tablet 0   . Polyethylene Glycol POWD Take 17 g by mouth daily. 1000 g 0   . [DISCONTINUED] haloperidol (HALDOL) 5 MG tablet Take 1 tablet (5 mg total) by mouth at bedtime. 30 tablet 2   . [DISCONTINUED] traZODone (DESYREL)  50 MG tablet Take 1 tablet (50 mg total) by mouth at bedtime. 30 tablet 2     Patient Stressors: Financial difficulties Health problems Substance abuse  Patient Strengths: Ability for insight Average or above average intelligence Capable of independent living Communication skills Supportive family/friends  Treatment Modalities: Medication Management, Group therapy, Case management,  1 to 1 session with clinician, Psychoeducation, Recreational therapy.   Physician Treatment Plan for Primary Diagnosis: Undifferentiated schizophrenia (HCC) Long Term Goal(s): Improvement in symptoms so as ready for discharge Improvement in symptoms so as ready for discharge   Short Term Goals: Ability to identify changes in lifestyle to reduce recurrence of condition will improve Ability to verbalize feelings will improve Ability  to disclose and discuss suicidal ideas Ability to demonstrate self-control will improve Ability to identify and develop effective coping behaviors will improve Ability to maintain clinical measurements within normal limits will improve Compliance with prescribed medications will improve Ability to identify changes in lifestyle to reduce recurrence of condition will improve Ability to demonstrate self-control will improve Ability to identify triggers associated with substance abuse/mental health issues will improve  Medication Management: Evaluate patient's response, side effects, and tolerance of medication regimen.  Therapeutic Interventions: 1 to 1 sessions, Unit Group sessions and Medication administration.  Evaluation of Outcomes: Adequate for discharge   Physician Treatment Plan for Secondary Diagnosis: Principal Problem:   Undifferentiated schizophrenia (HCC) Active Problems:   Cocaine use disorder, moderate, dependence (HCC)   Adult hypothyroidism   Suicidal ideation   Alcohol use disorder, moderate, dependence (HCC)   GERD (gastroesophageal reflux disease)   Tobacco use disorder   Chest pain   Abdominal pain, epigastric  Long Term Goal(s): Improvement in symptoms so as ready for discharge Improvement in symptoms so as ready for discharge   Short Term Goals: Ability to identify changes in lifestyle to reduce recurrence of condition will improve Ability to verbalize feelings will improve Ability to disclose and discuss suicidal ideas Ability to demonstrate self-control will improve Ability to identify and develop effective coping behaviors will improve Ability to maintain clinical measurements within normal limits will improve Compliance with prescribed medications will improve Ability to identify changes in lifestyle to reduce recurrence of condition will improve Ability to demonstrate self-control will improve Ability to identify triggers associated with substance  abuse/mental health issues will improve     Medication Management: Evaluate patient's response, side effects, and tolerance of medication regimen.  Therapeutic Interventions: 1 to 1 sessions, Unit Group sessions and Medication administration.  Evaluation of Outcomes: Adequate for discharge    RN Treatment Plan for Primary Diagnosis: Undifferentiated schizophrenia (HCC) Long Term Goal(s): Knowledge of disease and therapeutic regimen to maintain health will improve  Short Term Goals: Ability to verbalize feelings will improve, Ability to disclose and discuss suicidal ideas, Ability to identify and develop effective coping behaviors will improve and Compliance with prescribed medications will improve  Medication Management: RN will administer medications as ordered by provider, will assess and evaluate patient's response and provide education to patient for prescribed medication. RN will report any adverse and/or side effects to prescribing provider.  Therapeutic Interventions: 1 on 1 counseling sessions, Psychoeducation, Medication administration, Evaluate responses to treatment, Monitor vital signs and CBGs as ordered, Perform/monitor CIWA, COWS, AIMS and Fall Risk screenings as ordered, Perform wound care treatments as ordered.  Evaluation of Outcomes: Adequate for discharge    LCSW Treatment Plan for Primary Diagnosis: Undifferentiated schizophrenia (HCC) Long Term Goal(s): Safe transition to appropriate next level of care  at discharge, Engage patient in therapeutic group addressing interpersonal concerns.  Short Term Goals: Engage patient in aftercare planning with referrals and resources, Increase social support, Increase ability to appropriately verbalize feelings, Increase emotional regulation, Facilitate acceptance of mental health diagnosis and concerns, Facilitate patient progression through stages of change regarding substance use diagnoses and concerns, Identify triggers associated  with mental health/substance abuse issues and Increase skills for wellness and recovery  Therapeutic Interventions: Assess for all discharge needs, 1 to 1 time with Social worker, Explore available resources and support systems, Assess for adequacy in community support network, Educate family and significant other(s) on suicide prevention, Complete Psychosocial Assessment, Interpersonal group therapy.  Evaluation of Outcomes: Adequate for discharge    Progress in Treatment: Attending groups: CSW still assessing, pt new to milieu Participating in groups: CSW still assessing, pt new to milieu Taking medication as prescribed: Yes. Toleration medication: Yes. Family/Significant other contact made: No, will contact:  pt's family Patient understands diagnosis: Yes. Discussing patient identified problems/goals with staff: Yes. Medical problems stabilized or resolved: Yes. Denies suicidal/homicidal ideation: Yes. Issues/concerns per patient self-inventory: No. Other: None  New problem(s) identified: No, Describe:  none listed  New Short Term/Long Term Goal(s):  Discharge Plan or Barriers: CSW still assessing for appropriate referrals   Reason for Continuation of Hospitalization: Anxiety Delusions  Suicidal ideation Withdrawal symptoms  Estimated Length of Stay: 3-5 days  Attendees: Patient: Charles Charles  04/11/2016 10:58 AM  Physician: Kristine Linea, MD 04/11/2016 10:58 AM  Nursing: Elenore Paddy, RN 04/11/2016 10:58 AM  RN Care Manager: 04/11/2016 10:58 AM  Social Worker: York Grice, MSW, LCSW-A 04/11/2016 10:58 AM  Recreational Therapist:                                        04/11/2016  11:37AM Beth Neva Seat, LRT, CTRS   Scribe for Treatment Team: Lynden Oxford, LCSWA 04/16/2016 11:11 AM

## 2016-04-16 NOTE — Discharge Instructions (Signed)

## 2016-04-16 NOTE — Progress Notes (Signed)
  [facility] Adult Case Management Discharge Plan :  Will you be returning to the same living situation after discharge:  Yes,  home with family. At discharge, do you have transportation home?: Yes,  mother will pick pt up. Do you have the ability to pay for your medications: Yes,  Medicaid   Release of information consent forms completed and in the chart;  Patient's signature needed at discharge.  Patient to Follow up at: Follow-up Information    [facility]. Go on 04/19/2016.   Why:  [facility] at [facility] for assessment for medication management and therapy. Arrive as early as possible for prompt service. Please call Unk PintoHarvey Bryant at [phone] for questions and assistance. Contact information: Address: [address], [place], [place] [address] Phone: [phone] Fax: [phone]          Next level of care provider has access to [facility] Link:no  Safety Planning and Suicide Prevention discussed: Yes,  SPE completed with patient and mother.  Have you used any form of tobacco in the last 30 days? (Cigarettes, Smokeless Tobacco, Cigars, and/or Pipes): Yes  Has patient been referred to the Quitline?: Patient refused referral  Patient has been referred for addiction treatment: Pt. refused referral  Lynden OxfordKadijah R Tanieka Pownall, MSW, LCSW-A 04/16/2016, 11:22 AM

## 2016-04-17 LAB — ECHOCARDIOGRAM COMPLETE
HEIGHTINCHES: 63 in
WEIGHTICAEL: 2224 [oz_av]

## 2016-05-05 ENCOUNTER — Emergency Department
Admission: EM | Admit: 2016-05-05 | Discharge: 2016-05-06 | Disposition: A | Discharge status: Other Acute Inpatient Hospital | Payer: Medicaid Other | Attending: Emergency Medicine | Admitting: Emergency Medicine

## 2016-05-05 ENCOUNTER — Encounter: Payer: Self-pay | Admitting: Emergency Medicine

## 2016-05-05 DIAGNOSIS — Z9119 Patient's noncompliance with other medical treatment and regimen: Secondary | ICD-10-CM

## 2016-05-05 DIAGNOSIS — Z79899 Other long term (current) drug therapy: Secondary | ICD-10-CM | POA: Insufficient documentation

## 2016-05-05 DIAGNOSIS — Z791 Long term (current) use of non-steroidal anti-inflammatories (NSAID): Secondary | ICD-10-CM | POA: Diagnosis not present

## 2016-05-05 DIAGNOSIS — R45851 Suicidal ideations: Secondary | ICD-10-CM | POA: Diagnosis present

## 2016-05-05 DIAGNOSIS — F17210 Nicotine dependence, cigarettes, uncomplicated: Secondary | ICD-10-CM | POA: Insufficient documentation

## 2016-05-05 DIAGNOSIS — R440 Auditory hallucinations: Secondary | ICD-10-CM | POA: Insufficient documentation

## 2016-05-05 DIAGNOSIS — Z91199 Patient's noncompliance with other medical treatment and regimen due to unspecified reason: Secondary | ICD-10-CM

## 2016-05-05 DIAGNOSIS — E039 Hypothyroidism, unspecified: Secondary | ICD-10-CM | POA: Diagnosis present

## 2016-05-05 DIAGNOSIS — R45850 Homicidal ideations: Secondary | ICD-10-CM | POA: Diagnosis not present

## 2016-05-05 DIAGNOSIS — F203 Undifferentiated schizophrenia: Secondary | ICD-10-CM | POA: Diagnosis present

## 2016-05-05 DIAGNOSIS — F1020 Alcohol dependence, uncomplicated: Secondary | ICD-10-CM | POA: Diagnosis present

## 2016-05-05 DIAGNOSIS — K219 Gastro-esophageal reflux disease without esophagitis: Secondary | ICD-10-CM | POA: Diagnosis present

## 2016-05-05 LAB — CBC
HEMATOCRIT: 38.6 % — AB (ref 40.0–52.0)
Hemoglobin: 13.8 g/dL (ref 13.0–18.0)
MCH: 33.6 pg (ref 26.0–34.0)
MCHC: 35.7 g/dL (ref 32.0–36.0)
MCV: 94.3 fL (ref 80.0–100.0)
PLATELETS: 334 10*3/uL (ref 150–440)
RBC: 4.1 MIL/uL — ABNORMAL LOW (ref 4.40–5.90)
RDW: 13.6 % (ref 11.5–14.5)
WBC: 7 10*3/uL (ref 3.8–10.6)

## 2016-05-05 LAB — URINE DRUG SCREEN, QUALITATIVE (ARMC ONLY)
Amphetamines, Ur Screen: NOT DETECTED
BARBITURATES, UR SCREEN: NOT DETECTED
BENZODIAZEPINE, UR SCRN: NOT DETECTED
Cannabinoid 50 Ng, Ur ~~LOC~~: POSITIVE — AB
Cocaine Metabolite,Ur ~~LOC~~: POSITIVE — AB
MDMA (Ecstasy)Ur Screen: NOT DETECTED
Methadone Scn, Ur: NOT DETECTED
OPIATE, UR SCREEN: NOT DETECTED
PHENCYCLIDINE (PCP) UR S: NOT DETECTED
Tricyclic, Ur Screen: NOT DETECTED

## 2016-05-05 LAB — COMPREHENSIVE METABOLIC PANEL
ALBUMIN: 3.2 g/dL — AB (ref 3.5–5.0)
ALK PHOS: 71 U/L (ref 38–126)
ALT: 14 U/L — ABNORMAL LOW (ref 17–63)
ANION GAP: 5 (ref 5–15)
AST: 20 U/L (ref 15–41)
BILIRUBIN TOTAL: 0.3 mg/dL (ref 0.3–1.2)
BUN: 15 mg/dL (ref 6–20)
CALCIUM: 8.8 mg/dL — AB (ref 8.9–10.3)
CO2: 25 mmol/L (ref 22–32)
Chloride: 109 mmol/L (ref 101–111)
Creatinine, Ser: 0.87 mg/dL (ref 0.61–1.24)
GFR calc Af Amer: >60 mL/min (ref >60)
GLUCOSE: 113 mg/dL — AB (ref 65–99)
Potassium: 3.8 mmol/L (ref 3.5–5.1)
Sodium: 139 mmol/L (ref 135–145)
TOTAL PROTEIN: 7 g/dL (ref 6.5–8.1)

## 2016-05-05 LAB — SALICYLATE LEVEL: Salicylate Lvl: <7 mg/dL (ref 2.8–30.0)

## 2016-05-05 LAB — ETHANOL

## 2016-05-05 LAB — ACETAMINOPHEN LEVEL
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
## 2016-05-05 NOTE — ED Triage Notes (Signed)
Pt reports arguing with mom today and now has thoughts to hurt himself and his mother.  Also hearing voices per pt. No plan on how to hurt himself.

## 2016-05-05 NOTE — ED Notes (Signed)
TTS at bedside. 

## 2016-05-05 NOTE — ED Notes (Signed)
Report was received from Jodelle GreenLuAnn C., RN; Pt. Verbalizes no complaints or distress; verbalizes having both S.I./Hi; and c/o hearing voices;  Continue to monitor with 15 min. Monitoring.

## 2016-05-05 NOTE — ED Provider Notes (Signed)
[facility] Emergency Department Provider Note  ____________________________________________   First MD Initiated Contact with Patient 05/05/16 1620     (approximate)  I have reviewed the triage vital signs and the nursing notes.   HISTORY  Chief Complaint Suicidal   HPI Charles Charles is a 55 y.o. male with a history of schizophrenia who is presenting to the emergency department with several days of worsening thoughts of hurting himself as well as his mother. He does not a specific plan. He says that his is worsened when he got into an argument with his mother. He says that he is also hearing voices telling him to hurt his mother. He denies any homicidal and suffers mother prior to arrival. Denies any toxic ingestions. Denies any substance abuse or drinking.   Past Medical History:  Diagnosis Date  . GERD (gastroesophageal reflux disease)   . Headache   . Schizo-affective schizophrenia [facility])     Patient Active Problem List   Diagnosis Date Noted  . Chest pain 04/14/2016  . Abdominal pain, epigastric 04/14/2016  . 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  . Alcohol use disorder, moderate, dependence (HCC) 04/10/2016  . GERD (gastroesophageal reflux disease) 04/10/2016  . Tobacco use disorder 04/10/2016  . Suicidal ideation 04/09/2016  . Stimulant abuse 12/14/2014  . Cocaine use disorder, moderate, dependence (HCC) 12/17/2013  . Adult hypothyroidism 12/17/2013  . 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 Blurred vision 12/17/2013    Past Surgical History:  Procedure Laterality Date  . COLONOSCOPY WITH PROPOFOL N/A 08/04/2015   Procedure: COLONOSCOPY WITH PROPOFOL;  Surgeon: Midge Minium, MD;  Location: [facility];  Service: Endoscopy;  Laterality: N/A;  . INCISION AND DRAINAGE PERIRECTAL ABSCESS N/A 07/14/2015   Procedure: IRRIGATION AND DEBRIDEMENT PERIRECTAL ABSCESS;  Surgeon: Lattie Haw, MD;  Location: [facility];  Service:  General;  Laterality: N/A;  . INCISION AND DRAINAGE PERIRECTAL ABSCESS N/A 08/14/2015   Procedure: IRRIGATION AND DEBRIDEMENT PERIRECTAL ABSCESS;  Surgeon: Gladis Riffle, MD;  Location: [facility];  Service: General;  Laterality: N/A;    Prior to Admission medications   Medication Sig Start Date End Date Taking? Authorizing Provider  aspirin EC 81 MG EC tablet Take 1 tablet (81 mg total) by mouth daily. 04/16/16   Shari Prows, MD  benztropine (COGENTIN) 0.5 MG tablet Take 1 tablet (0.5 mg total) by mouth at bedtime. 04/15/16   Shari Prows, MD  famotidine (PEPCID) 20 MG tablet Take 1 tablet (20 mg total) by mouth 2 (two) times daily. 03/20/16   Emily Filbert, MD  haloperidol (HALDOL) 5 MG tablet Take 1 tablet (5 mg total) by mouth at bedtime. 04/15/16   Shari Prows, MD  ibuprofen (ADVIL,MOTRIN) 600 MG tablet Take 1 tablet (600 mg total) by mouth every 6 (six) hours as needed. 09/15/15   Gladis Riffle, MD  levothyroxine (SYNTHROID, LEVOTHROID) 50 MCG tablet Take 1 tablet (50 mcg total) by mouth daily before breakfast. 04/16/16   Shari Prows, MD  Polyethylene Glycol POWD Take 17 g by mouth daily. 09/15/15   Gladis Riffle, MD  traZODone (DESYREL) 50 MG tablet Take 1 tablet (50 mg total) by mouth at bedtime. 04/15/16   Shari Prows, MD    Allergies Penicillins  Family History  Problem Relation Age of Onset  . Anxiety disorder Mother   . Diabetes Mother   . Diabetes Father     Social History Social History  Substance Use Topics  .  Smoking status: Current Every Day Smoker    Packs/day: 0.10    Types: Cigarettes  . Smokeless tobacco: Never Used     Comment: (1-2 cigs/day)  . Alcohol use 33.6 oz/week    56 Cans of beer per week     Comment: 2 x 40's daily - pt says no alcohol for several weeks    Review of Systems Constitutional: No fever/chills Eyes: No visual changes. ENT: No sore throat. Cardiovascular: Denies chest  pain. Respiratory: Denies shortness of breath. Gastrointestinal: No abdominal pain.  No nausea, no vomiting.  No diarrhea.  No constipation. Genitourinary: Negative for dysuria. Musculoskeletal: Negative for back pain. Skin: Negative for rash. Neurological: Negative for headaches, focal weakness or numbness.  10-point ROS otherwise negative.  ____________________________________________   PHYSICAL EXAM:  VITAL SIGNS: ED Triage Vitals [05/05/16 1539]  Enc Vitals Group     BP (!) 103/52     Pulse Rate 66     Resp 14     Temp 98.5 F (36.9 C)     Temp Source Oral     SpO2 99 %     Weight 144 lb (65.3 kg)     Height 5\' 3"  (1.6 m)     Head Circumference      Peak Flow      Pain Score      Pain Loc      Pain Edu?      Excl. in GC?     Constitutional: Alert and oriented. Well appearing and in no acute distress. Eyes: Conjunctivae are normal. PERRL. EOMI. Head: Atraumatic. Nose: No congestion/rhinnorhea. Mouth/Throat: Mucous membranes are moist.  Neck: No stridor.   Cardiovascular: Normal rate, regular rhythm. Grossly normal heart sounds.   Respiratory: Normal respiratory effort.  No retractions. Lungs CTAB. Gastrointestinal: Soft and nontender. No distention.  Musculoskeletal: No lower extremity tenderness nor edema.  No joint effusions. Neurologic:  Normal speech and language. No gross focal neurologic deficits are appreciated. Skin:  Skin is warm, dry and intact. No rash noted. Psychiatric: Mood and affect are normal. Speech and behavior are normal.  ____________________________________________   LABS (all labs ordered are listed, but only abnormal results are displayed)  Labs Reviewed  COMPREHENSIVE METABOLIC PANEL - Abnormal; Notable for the following:       Result Value   Glucose, Bld 113 (*)    Calcium 8.8 (*)    Albumin 3.2 (*)    ALT 14 (*)    All other components within normal limits  ACETAMINOPHEN LEVEL - Abnormal; Notable for the following:     Acetaminophen (Tylenol), Serum <10 (*)    All other components within normal limits  CBC - Abnormal; Notable for the following:    RBC 4.10 (*)    HCT 38.6 (*)    All other components within normal limits  URINE DRUG SCREEN, QUALITATIVE ([facility] ONLY) - Abnormal; Notable for the following:    Cocaine Metabolite,Ur [place] POSITIVE (*)    Cannabinoid 50 Ng, Ur [place] POSITIVE (*)    All other components within normal limits  ETHANOL  SALICYLATE LEVEL   ____________________________________________  EKG   ____________________________________________  RADIOLOGY   ____________________________________________   PROCEDURES  Procedure(s) performed:   Procedures  Critical Care performed:   ____________________________________________   INITIAL IMPRESSION / ASSESSMENT AND PLAN / ED COURSE  Pertinent labs & imaging results that were available during my care of the patient were reviewed by me and considered in my medical decision making (see chart for  details).  We'll complete involuntary commitment paperwork for this patient. Consult with psychiatry as well as the behavioral health intake. The patient is understanding of the need for commitment.  Clinical Course      ____________________________________________   FINAL CLINICAL IMPRESSION(S) / ED DIAGNOSES  Suicidal and homicidal ideation. Auditory hallucinations.    NEW MEDICATIONS STARTED DURING THIS VISIT:  New Prescriptions   No medications on file     Note:  This document was prepared using Dragon voice recognition software and may include unintentional dictation errors.    Myrna Blazeravid Matthew Schaevitz, MD 05/05/16 [phone]

## 2016-05-05 NOTE — ED Notes (Signed)

## 2016-05-05 NOTE — ED Notes (Signed)
Patient states he "needs peace and quiet".  States he would be interested in long term substance abuse treatment. Confirms SI with "no plan."  Support offered.

## 2016-05-06 ENCOUNTER — Inpatient Hospital Stay
Admission: EM | Admit: 2016-05-06 | Discharge: 2016-05-09 | DRG: 885 | Disposition: A | Discharge status: Home / Self Care | Payer: Medicaid Other | Source: Intra-hospital | Attending: Psychiatry | Admitting: Psychiatry

## 2016-05-06 DIAGNOSIS — Z9119 Patient's noncompliance with other medical treatment and regimen: Secondary | ICD-10-CM

## 2016-05-06 DIAGNOSIS — F203 Undifferentiated schizophrenia: Principal | ICD-10-CM | POA: Diagnosis present

## 2016-05-06 DIAGNOSIS — F17210 Nicotine dependence, cigarettes, uncomplicated: Secondary | ICD-10-CM | POA: Diagnosis present

## 2016-05-06 DIAGNOSIS — F1220 Cannabis dependence, uncomplicated: Secondary | ICD-10-CM | POA: Diagnosis present

## 2016-05-06 DIAGNOSIS — E039 Hypothyroidism, unspecified: Secondary | ICD-10-CM | POA: Diagnosis present

## 2016-05-06 DIAGNOSIS — R45851 Suicidal ideations: Secondary | ICD-10-CM | POA: Diagnosis present

## 2016-05-06 DIAGNOSIS — R440 Auditory hallucinations: Secondary | ICD-10-CM | POA: Diagnosis not present

## 2016-05-06 DIAGNOSIS — F1020 Alcohol dependence, uncomplicated: Secondary | ICD-10-CM | POA: Diagnosis present

## 2016-05-06 DIAGNOSIS — R45850 Homicidal ideations: Secondary | ICD-10-CM | POA: Diagnosis present

## 2016-05-06 DIAGNOSIS — G4700 Insomnia, unspecified: Secondary | ICD-10-CM | POA: Diagnosis present

## 2016-05-06 DIAGNOSIS — K219 Gastro-esophageal reflux disease without esophagitis: Secondary | ICD-10-CM | POA: Diagnosis present

## 2016-05-06 DIAGNOSIS — Z91199 Patient's noncompliance with other medical treatment and regimen due to unspecified reason: Secondary | ICD-10-CM

## 2016-05-06 DIAGNOSIS — F209 Schizophrenia, unspecified: Secondary | ICD-10-CM | POA: Diagnosis present

## 2016-05-06 DIAGNOSIS — F17200 Nicotine dependence, unspecified, uncomplicated: Secondary | ICD-10-CM | POA: Diagnosis present

## 2016-05-06 DIAGNOSIS — F1420 Cocaine dependence, uncomplicated: Secondary | ICD-10-CM | POA: Diagnosis present

## 2016-05-06 HISTORY — DX: Cannabis dependence, uncomplicated: F12.20

## 2016-05-06 MED ORDER — MAGNESIUM HYDROXIDE 400 MG/5ML PO SUSP: 30.0000 mL | Freq: Every day | ORAL | Status: DC | PRN

## 2016-05-06 MED ORDER — FAMOTIDINE 20 MG PO TABS
20.0000 mg | ORAL_TABLET | Freq: Every day | ORAL | Status: DC
  Administered 2016-05-07 – 2016-05-09 (×3): 20 mg via ORAL
  Filled 2016-05-06 (×3): qty 1

## 2016-05-06 MED ORDER — HYDROXYZINE HCL 25 MG PO TABS: 25.0000 mg | ORAL_TABLET | Freq: Three times a day (TID) | ORAL | Status: DC | PRN

## 2016-05-06 MED ORDER — HALOPERIDOL 5 MG PO TABS: 5.0000 mg | ORAL_TABLET | Freq: Every day | ORAL | Status: DC

## 2016-05-06 MED ORDER — FAMOTIDINE 20 MG PO TABS
20.0000 mg | ORAL_TABLET | Freq: Every day | ORAL | Status: DC
  Administered 2016-05-06: 20 mg via ORAL
  Filled 2016-05-06: qty 1

## 2016-05-06 MED ORDER — TRAZODONE HCL 50 MG PO TABS
50.0000 mg | ORAL_TABLET | Freq: Every evening | ORAL | Status: DC | PRN
  Administered 2016-05-06: 50 mg via ORAL
  Filled 2016-05-06: qty 1

## 2016-05-06 MED ORDER — HALOPERIDOL 5 MG PO TABS
5.0000 mg | ORAL_TABLET | Freq: Every day | ORAL | Status: DC
  Administered 2016-05-06 – 2016-05-08 (×3): 5 mg via ORAL
  Filled 2016-05-06 (×4): qty 1

## 2016-05-06 MED ORDER — BENZTROPINE MESYLATE 1 MG PO TABS: 0.5000 mg | ORAL_TABLET | Freq: Every day | ORAL | Status: DC

## 2016-05-06 MED ORDER — TRAZODONE HCL 100 MG PO TABS: 100.0000 mg | ORAL_TABLET | Freq: Every evening | ORAL | Status: DC | PRN

## 2016-05-06 MED ORDER — ASPIRIN EC 81 MG PO TBEC
81.0000 mg | DELAYED_RELEASE_TABLET | Freq: Every day | ORAL | Status: DC
  Administered 2016-05-06 – 2016-05-09 (×4): 81 mg via ORAL
  Filled 2016-05-06 (×4): qty 1

## 2016-05-06 MED ORDER — ASPIRIN EC 81 MG PO TBEC
81.0000 mg | DELAYED_RELEASE_TABLET | Freq: Every day | ORAL | Status: DC
  Administered 2016-05-06: 81 mg via ORAL
  Filled 2016-05-06: qty 1

## 2016-05-06 MED ORDER — ACETAMINOPHEN 325 MG PO TABS
650.0000 mg | ORAL_TABLET | Freq: Four times a day (QID) | ORAL | Status: DC | PRN
  Administered 2016-05-07 – 2016-05-08 (×4): 650 mg via ORAL
  Filled 2016-05-06 (×4): qty 2

## 2016-05-06 MED ORDER — LEVOTHYROXINE SODIUM 25 MCG PO TABS: 50.0000 ug | ORAL_TABLET | Freq: Every day | ORAL | Status: DC

## 2016-05-06 MED ORDER — LEVOTHYROXINE SODIUM 25 MCG PO TABS
50.0000 ug | ORAL_TABLET | Freq: Every day | ORAL | Status: DC
  Administered 2016-05-06 – 2016-05-09 (×4): 50 ug via ORAL
  Filled 2016-05-06 (×4): qty 2

## 2016-05-06 MED ORDER — BENZTROPINE MESYLATE 1 MG PO TABS
0.5000 mg | ORAL_TABLET | Freq: Every day | ORAL | Status: DC
  Administered 2016-05-06 – 2016-05-08 (×3): 0.5 mg via ORAL
  Filled 2016-05-06 (×4): qty 1

## 2016-05-06 MED ORDER — ALUM & MAG HYDROXIDE-SIMETH 200-200-20 MG/5ML PO SUSP: 30.0000 mL | ORAL | Status: DC | PRN

## 2016-05-06 MED ORDER — TRAZODONE HCL 50 MG PO TABS: 50.0000 mg | ORAL_TABLET | Freq: Every evening | ORAL | Status: DC | PRN

## 2016-05-06 NOTE — ED Notes (Signed)
Pt asking RN when he will be able to have a hot meal.  RN informed pt he will have more options when he is transferred to BMU. Pt accepting. Pt continues to be polite and cooperative.  Report called to [employer], Charity fundraiserN.   Maintained on 15 minute checks and observation by security camera for safety.

## 2016-05-06 NOTE — ED Notes (Signed)
Pt denies pain. Pt verbalizes hearing voices telling him to kill his mother and himself.  Pt expresses SI, HI and AH. Pt's mother now has his code.  Pt cooperative, calm and polite. Breakfast tray given.   Maintained on 15 minute checks and observation by security camera for safety.

## 2016-05-06 NOTE — Progress Notes (Signed)
[facility] Dr. Toni Amendlapacs.  Attending Physician will be Dr. Jennet MaduroPucilowska.   Patient [facility] [place].    ER staff is aware of the admission ( ER Sect.; ER MD; Patient's Nurse & Patient Access). Laddie Naeem K. Sherlon HandingHarris, LCAS-A, LPC-A, [facility]  Counselor 05/06/2016 11:58 AM

## 2016-05-06 NOTE — Progress Notes (Signed)
Attempted to call pt. Mother Kathie RhodesBetty home [phone] and cell [phone], no answer unable to leave message, no voice identification. Melena Hayes K. Sherlon HandingHarris, LCAS-A, LPC-A, [facility]  Counselor 05/06/2016 10:42 AM

## 2016-05-06 NOTE — Consult Note (Signed)
[facility] Consult   Reason for Consult:  Consult for 55 year old man with a history of schizophrenia and substance abuse who came to the emergency room with worsening symptoms Referring Physician:  [facility] Patient Identification: Charles Charles MRN:  [id number] Principal Diagnosis: Undifferentiated schizophrenia [facility]) Diagnosis:   Patient Active Problem List   Diagnosis Date Noted  . Noncompliance [Z91.19] 05/06/2016  . Chest pain [R07.9] 04/14/2016  . Abdominal pain, epigastric [R10.13] 04/14/2016  . Undifferentiated schizophrenia ([place]) [F20.3] 04/10/2016  . Alcohol use disorder, moderate, dependence ([place]) [F10.20] 04/10/2016  . GERD (gastroesophageal reflux disease) [K21.9] 04/10/2016  . Tobacco use disorder [F17.200] 04/10/2016  . Suicidal ideation [R45.851] 04/09/2016  . Stimulant abuse [F15.10] 12/14/2014  . Cocaine use disorder, moderate, dependence ([place]) [F14.20] 12/17/2013  . Adult hypothyroidism [E03.9] 12/17/2013  . 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 Blurred vision [H53.8] 12/17/2013    Total Time spent with patient: 1 hour  Subjective:   Charles Charles is a 55 y.o. male patient admitted with "I've been arguing with my mother".  HPI:  Patient interviewed. Chart reviewed. Labs and vitals reviewed. 55 year old man with a history of schizophrenia and substance abuse returns voluntarily to the emergency room. He states that his symptoms have been getting markedly worse for the past one to one and a half weeks. It sounds like he was initially doing reasonably well after he left the hospital last time but he was staying with his mother. He and his mother started arguing and his mood got progressively worse. He sounds like he probably moved out of the home over a week ago and somehow he either loss neglected or ran out of his medicine. He says he hasn't been on his medicine for at least a week. He denies that he's been drinking very much. Also  denies that he's been using cocaine. Admits he has smoked a little bit of weed. Situation has been stressful. Doesn't have a stable place to stay. Sleep is poor. Not eating well not taking care of his health. Not taking his medicine. He says he's having hallucinations most of the time now that are hostile and telling him negative things and encourage him to act badly. Mood is feeling down. Having suicidal and aggressive thoughts. Wants to get back into stability.  Social history: He does receive disability. Tends to resist living in group homes because of his money. Had been staying with his mother recently which sounds like it is a due situation. Has other extended family but the closest other relatives are in [place].  Substance abuse history: Has a history of abuse of alcohol cocaine and marijuana. Drug abuse obviously has driven symptoms to be worse in the past. He says that he hasn't been drinking or using cocaine in the last week or so.  Medical history: Has hypothyroidism. Also gastric reflux symptoms.  Past Psychiatric History: Long-standing established history of schizophrenia or schizoaffective disorder complicated by substance abuse. He was just in the hospital here during October. He was noncompliant with appropriate treatment after last discharge. Had responded reasonably well to medications specifically Haldol in the past. Does have a distant history of suicidal and of violent and aggressive behavior.  Risk to Self: Suicidal Ideation: Yes-Currently Present Suicidal Intent: No Is patient at risk for suicide?: Yes Suicidal Plan?: No Access to Means: No Specify Access to Suicidal Means: n/a What has been your use of drugs/alcohol within the last 12 months?: marijuana in past How many times?: 2 Other Self  Harm Risks: cutting Triggers for Past Attempts: None known Intentional Self Injurious Behavior: Cutting Comment - Self Injurious Behavior: cutting behaviors Risk to Others:  Homicidal Ideation: Yes-Currently Present Thoughts of Harm to Others: Yes-Currently Present Comment - Thoughts of Harm to Others: pt. states intrusive thoughts Current Homicidal Intent: No Current Homicidal Plan: No Access to Homicidal Means: Yes Describe Access to Homicidal Means: unspecified Identified Victim: none History of harm to others?: No Assessment of Violence: In distant past Violent Behavior Description: throw object, hit walls Does patient have access to weapons?: No Criminal Charges Pending?: Yes Describe Pending Criminal Charges: unspecified Does patient have a court date: Yes Prior Inpatient Therapy: Prior Inpatient Therapy: Yes Prior Therapy Dates: 2017 Prior Therapy Facilty/Provider(s): [facility] Reason for Treatment: Schizophrenia Prior Outpatient Therapy: Prior Outpatient Therapy: Yes Prior Therapy Dates: current Prior Therapy Facilty/Provider(s): [facility] Reason for Treatment: Schizophrenia Does patient have an ACCT team?: No Does patient have Intensive In-House Services?  : No Does patient have [facility] services? : No Does patient have P4CC services?: No  Past Medical History:  Past Medical History:  Diagnosis Date  . GERD (gastroesophageal reflux disease)   . Headache   . Schizo-affective schizophrenia [facility])     Past Surgical History:  Procedure Laterality Date  . COLONOSCOPY WITH PROPOFOL N/A 08/04/2015   Procedure: COLONOSCOPY WITH PROPOFOL;  Surgeon: Lucilla Lame, MD;  Location: [facility];  Service: Endoscopy;  Laterality: N/A;  . INCISION AND DRAINAGE PERIRECTAL ABSCESS N/A 07/14/2015   Procedure: IRRIGATION AND DEBRIDEMENT PERIRECTAL ABSCESS;  Surgeon: Florene Glen, MD;  Location: [facility];  Service: General;  Laterality: N/A;  . INCISION AND DRAINAGE PERIRECTAL ABSCESS N/A 08/14/2015   Procedure: IRRIGATION AND DEBRIDEMENT PERIRECTAL ABSCESS;  Surgeon: Hubbard Robinson, MD;  Location: [facility];  Service: General;  Laterality: N/A;   Family  History:  Family History  Problem Relation Age of Onset  . Anxiety disorder Mother   . Diabetes Mother   . Diabetes Father    Family Psychiatric  History: He says he thinks his mother might have mental health problems but I think he is probably just trying to be insulting. There isn't any other specific documented mental health history in the family. Social History:  History  Alcohol Use  . 33.6 oz/week  . 4 Cans of beer per week    Comment: 2 x 40's daily - pt says no alcohol for several weeks     History  Drug Use No    Comment: back in the days    Social History   Social History  . Marital status: Single    Spouse name: N/A  . Number of children: N/A  . Years of education: N/A   Social History Main Topics  . Smoking status: Current Every Day Smoker    Packs/day: 0.10    Types: Cigarettes  . Smokeless tobacco: Never Used     Comment: (1-2 cigs/day)  . Alcohol use 33.6 oz/week    56 Cans of beer per week     Comment: 2 x 40's daily - pt says no alcohol for several weeks  . Drug use: No     Comment: back in the days  . Sexual activity: Not Asked   Other Topics Concern  . None   Social History Narrative  . None   Additional Social History:    Allergies:   Allergies  Allergen Reactions  . Penicillins Anaphylaxis, Hives and Other (See Comments)    Has patient had  a PCN reaction causing immediate rash, facial/tongue/throat swelling, SOB or lightheadedness with hypotension: Yes Has patient had a PCN reaction causing severe rash involving mucus membranes or skin necrosis: No Has patient had a PCN reaction that required hospitalization No Has patient had a PCN reaction occurring within the last 10 years: Yes If all of the above answers are "NO", then may proceed with Cephalosporin use.    Labs:  Results for orders placed or performed during the hospital encounter of 05/05/16 (from the past 48 hour(s))  Comprehensive metabolic panel     Status: Abnormal    Collection Time: 05/05/16  3:45 PM  Result Value Ref Range   Sodium 139 135 - 145 mmol/L   Potassium 3.8 3.5 - 5.1 mmol/L   Chloride 109 101 - 111 mmol/L   CO2 25 22 - 32 mmol/L   Glucose, Bld 113 (H) 65 - 99 mg/dL   BUN 15 6 - 20 mg/dL   Creatinine, Ser 0.87 0.61 - 1.24 mg/dL   Calcium 8.8 (L) 8.9 - 10.3 mg/dL   Total Protein 7.0 6.5 - 8.1 g/dL   Albumin 3.2 (L) 3.5 - 5.0 g/dL   AST 20 15 - 41 U/L   ALT 14 (L) 17 - 63 U/L   Alkaline Phosphatase 71 38 - 126 U/L   Total Bilirubin 0.3 0.3 - 1.2 mg/dL   GFR calc non Af Amer >60 >60 mL/min   GFR calc Af Amer >60 >60 mL/min    Comment: (NOTE) The eGFR has been calculated using the CKD EPI equation. This calculation has not been validated in all clinical situations. eGFR's persistently <60 mL/min signify possible Chronic Kidney Disease.    Anion gap 5 5 - 15  Ethanol     Status: None   Collection Time: 05/05/16  3:45 PM  Result Value Ref Range   Alcohol, Ethyl (B) <5 <5 mg/dL    Comment:        LOWEST DETECTABLE LIMIT FOR SERUM ALCOHOL IS 5 mg/dL FOR MEDICAL PURPOSES ONLY   Salicylate level     Status: None   Collection Time: 05/05/16  3:45 PM  Result Value Ref Range   Salicylate Lvl <3.7 2.8 - 30.0 mg/dL  Acetaminophen level     Status: Abnormal   Collection Time: 05/05/16  3:45 PM  Result Value Ref Range   Acetaminophen (Tylenol), Serum <10 (L) 10 - 30 ug/mL    Comment:        THERAPEUTIC CONCENTRATIONS VARY SIGNIFICANTLY. A RANGE OF 10-30 ug/mL MAY BE AN EFFECTIVE CONCENTRATION FOR MANY PATIENTS. HOWEVER, SOME ARE BEST TREATED AT CONCENTRATIONS OUTSIDE THIS RANGE. ACETAMINOPHEN CONCENTRATIONS >150 ug/mL AT 4 HOURS AFTER INGESTION AND >50 ug/mL AT 12 HOURS AFTER INGESTION ARE OFTEN ASSOCIATED WITH TOXIC REACTIONS.   cbc     Status: Abnormal   Collection Time: 05/05/16  3:45 PM  Result Value Ref Range   WBC 7.0 3.8 - 10.6 K/uL   RBC 4.10 (L) 4.40 - 5.90 MIL/uL   Hemoglobin 13.8 13.0 - 18.0 g/dL   HCT 38.6 (L)  40.0 - 52.0 %   MCV 94.3 80.0 - 100.0 fL   MCH 33.6 26.0 - 34.0 pg   MCHC 35.7 32.0 - 36.0 g/dL   RDW 13.6 11.5 - 14.5 %   Platelets 334 150 - 440 K/uL  Urine Drug Screen, Qualitative     Status: Abnormal   Collection Time: 05/05/16  3:45 PM  Result Value Ref Range   Tricyclic, Ur  Screen NONE DETECTED NONE DETECTED   Amphetamines, Ur Screen NONE DETECTED NONE DETECTED   MDMA (Ecstasy)Ur Screen NONE DETECTED NONE DETECTED   Cocaine Metabolite,Ur [place] POSITIVE (A) NONE DETECTED   Opiate, Ur Screen NONE DETECTED NONE DETECTED   Phencyclidine (PCP) Ur S NONE DETECTED NONE DETECTED   Cannabinoid 50 Ng, Ur [place] POSITIVE (A) NONE DETECTED   Barbiturates, Ur Screen NONE DETECTED NONE DETECTED   Benzodiazepine, Ur Scrn NONE DETECTED NONE DETECTED   Methadone Scn, Ur NONE DETECTED NONE DETECTED    Comment: (NOTE) 914  Tricyclics, urine               Cutoff 1000 ng/mL 200  Amphetamines, urine             Cutoff 1000 ng/mL 300  MDMA (Ecstasy), urine           Cutoff 500 ng/mL 400  Cocaine Metabolite, urine       Cutoff 300 ng/mL 500  Opiate, urine                   Cutoff 300 ng/mL 600  Phencyclidine (PCP), urine      Cutoff 25 ng/mL 700  Cannabinoid, urine              Cutoff 50 ng/mL 800  Barbiturates, urine             Cutoff 200 ng/mL 900  Benzodiazepine, urine           Cutoff 200 ng/mL 1000 Methadone, urine                Cutoff 300 ng/mL 1100 1200 The urine drug screen provides only a preliminary, unconfirmed 1300 analytical test result and should not be used for non-medical 1400 purposes. Clinical consideration and professional judgment should 1500 be applied to any positive drug screen result due to possible 1600 interfering substances. A more specific alternate chemical method 1700 must be used in order to obtain a confirmed analytical result.  1800 Gas chromato graphy / mass spectrometry (GC/MS) is the preferred 1900 confirmatory method.     Current Facility-Administered  Medications  Medication Dose Route Frequency Provider Last Rate Last Dose  . aspirin EC tablet 81 mg  81 mg Oral Daily Khilynn Borntreger T Argie Applegate, MD      . benztropine (COGENTIN) tablet 0.5 mg  0.5 mg Oral QHS Gonzella Lex, MD      . famotidine (PEPCID) tablet 20 mg  20 mg Oral Daily Christee Mervine T Mykela Mewborn, MD      . haloperidol (HALDOL) tablet 5 mg  5 mg Oral QHS Gonzella Lex, MD      . Derrill Memo ON 05/07/2016] levothyroxine (SYNTHROID, LEVOTHROID) tablet 50 mcg  50 mcg Oral QAC breakfast Gonzella Lex, MD      . traZODone (DESYREL) tablet 50 mg  50 mg Oral QHS PRN Gonzella Lex, MD       Current Outpatient Prescriptions  Medication Sig Dispense Refill  . aspirin EC 81 MG EC tablet Take 1 tablet (81 mg total) by mouth daily. 30 tablet 1  . benztropine (COGENTIN) 0.5 MG tablet Take 1 tablet (0.5 mg total) by mouth at bedtime. 30 tablet 1  . famotidine (PEPCID) 20 MG tablet Take 1 tablet (20 mg total) by mouth 2 (two) times daily. 60 tablet 1  . haloperidol (HALDOL) 5 MG tablet Take 1 tablet (5 mg total) by mouth at bedtime. 30 tablet 1  . ibuprofen (ADVIL,MOTRIN) 600  MG tablet Take 1 tablet (600 mg total) by mouth every 6 (six) hours as needed. 30 tablet 0  . levothyroxine (SYNTHROID, LEVOTHROID) 50 MCG tablet Take 1 tablet (50 mcg total) by mouth daily before breakfast. 30 tablet 1  . Polyethylene Glycol POWD Take 17 g by mouth daily. 1000 g 0  . traZODone (DESYREL) 50 MG tablet Take 1 tablet (50 mg total) by mouth at bedtime. 30 tablet 1    Musculoskeletal: Strength & Muscle Tone: within normal limits Gait & Station: normal Patient leans: N/A  Psychiatric Specialty Exam: Physical Exam  Nursing note and vitals reviewed. Constitutional: He appears well-developed and well-nourished.  HENT:  Head: Normocephalic and atraumatic.  Eyes: Conjunctivae are normal. Pupils are equal, round, and reactive to light.  Neck: Normal range of motion.  Cardiovascular: Regular rhythm and normal heart sounds.    Respiratory: Effort normal. No respiratory distress.  GI: Soft.  Musculoskeletal: Normal range of motion.  Neurological: He is alert.  Skin: Skin is warm and dry.  Psychiatric: His affect is blunt. His speech is delayed. He is slowed. Thought content is paranoid. Cognition and memory are normal. He expresses impulsivity. He expresses homicidal and suicidal ideation. He expresses suicidal plans.    Review of Systems  Constitutional: Negative.   HENT: Negative.   Eyes: Negative.   Respiratory: Negative.   Cardiovascular: Negative.   Gastrointestinal: Negative.   Musculoskeletal: Negative.   Skin: Negative.   Neurological: Negative.   Psychiatric/Behavioral: Positive for depression, hallucinations, substance abuse and suicidal ideas. Negative for memory loss. The patient is nervous/anxious and has insomnia.     Blood pressure 115/77, pulse 64, temperature 98 F (36.7 C), temperature source Oral, resp. rate 18, height '5\' 3"'$  (1.6 m), weight 65.3 kg (144 lb), SpO2 99 %.Body mass index is 25.51 kg/m.  General Appearance: Casual  Eye Contact:  Fair  Speech:  Slow  Volume:  Decreased  Mood:  Dysphoric  Affect:  Congruent  Thought Process:  Goal Directed  Orientation:  Full (Time, Place, and Person)  Thought Content:  Illogical and Rumination  Suicidal Thoughts:  Yes.  with intent/plan  Homicidal Thoughts:  Yes.  with intent/plan  Memory:  Immediate;   Good Recent;   Fair Remote;   Fair  Judgement:  Fair  Insight:  Fair  Psychomotor Activity:  Decreased  Concentration:  Concentration: Fair  Recall:  [employer] of Knowledge:  Fair  Language:  Fair  Akathisia:  No  Handed:  Right  AIMS (if indicated):     Assets:  Communication Skills Desire for Improvement Financial Resources/Insurance Resilience  ADL's:  Intact  Cognition:  WNL  Sleep:        Treatment Plan Summary: Daily contact with patient to assess and evaluate symptoms and progress in treatment, Medication  management and Plan Patient with return of suicidal and homicidal thoughts. Psychotic symptoms worse. Off of medication. Unstable living in treatment situation. Readmit to the psychiatric ward of the hospital. 15 minute checks in place. Restart medications based on previous orders. Recheck full lab tests including EKG. Reviewed plan with patient who is agreeable.  Disposition: Recommend psychiatric Inpatient admission when medically cleared. Supportive therapy provided about ongoing stressors.  Alethia Berthold, MD 05/06/2016 11:14 AM

## 2016-05-06 NOTE — Progress Notes (Signed)
Patient pleasant and cooperative during admission assessment. Patient denies SI/HI at this time. Patient verbalized that he is hearing voices to hurt himself  & others at times. Patient informed of fall risk status, fall risk assessed "low" at this time. Patient oriented to unit/staff/room. Patient denies any questions/concerns at this time. Patient safe on unit with Q15 minute checks for safety. Skin assessment & body search done.No contraband found.

## 2016-05-06 NOTE — ED Notes (Signed)
Patient resting quietly in room. No noted distress or abnormal behaviors noted. Will continue 15 minute checks and observation by security camera for safety. 

## 2016-05-06 NOTE — Tx Team (Signed)
Initial Treatment Plan 05/06/2016 5:18 PM Charles CaveGarry L Lecker ZOX:[id number]    PATIENT STRESSORS: Marital or family conflict Medication change or noncompliance Substance abuse   PATIENT STRENGTHS: Active sense of humor Average or above average intelligence Communication skills   PATIENT IDENTIFIED PROBLEMS: Undifferentiated schizophrenia 05/06/2016  Suicidal and homicidal ideations 05/06/2016                   DISCHARGE CRITERIA:  Ability to meet basic life and health needs Verbal commitment to aftercare and medication compliance  PRELIMINARY DISCHARGE PLAN: Attend aftercare/continuing care group Return to previous living arrangement  PATIENT/FAMILY INVOLVEMENT: This treatment plan has been presented to and reviewed with the patient, Charles CaveGarry L Balik, and/or family member,   The patient and family have been given the opportunity to ask questions and make suggestions.  Leonarda SalonGigi George Mel Langan, RN 05/06/2016, 5:18 PM

## 2016-05-06 NOTE — ED Notes (Signed)
All belongings will be sent with pt to BMU.  Pt accepting of inpatient admission.

## 2016-05-06 NOTE — ED Notes (Signed)
Pt to be admitted to BMU. Pt accepting. Compliant with medications.  Maintained on 15 minute checks and observation by security camera for safety.

## 2016-05-06 NOTE — ED Provider Notes (Signed)
-----------------------------------------   6:28 AM on 05/06/2016 -----------------------------------------   Blood pressure 115/77, pulse 64, temperature 98 F (36.7 C), temperature source Oral, resp. rate 18, height 5\' 3"  (1.6 m), weight 144 lb (65.3 kg), SpO2 99 %.  The patient had no acute events since last update.  Calm and cooperative at this time.  Disposition is pending Psychiatry/Behavioral Medicine team recommendations.     Irean HongJade J Lukah Goswami, MD 05/06/16 [phone]

## 2016-05-07 DIAGNOSIS — F203 Undifferentiated schizophrenia: Principal | ICD-10-CM

## 2016-05-07 NOTE — [facility] Counselor (Deleted)
Adult Comprehensive Assessment  Patient ID: Charles Charles, male   DOB: 07/12/1960, 55 y.o.   MRN: [id number]  Information Source: Information source: Patient  Current Stressors:     Living/Environment/Situation:  Living Arrangements: Parent  Family History:     Childhood History:     Education:     Employment/Work Situation:      Surveyor, quantityinancial Resources:      Alcohol/Substance Abuse:      Social Support System:      Leisure/Recreation:      Strengths/Needs:      Discharge Plan:      Summary/Recommendations:   Summary and Recommendations (to be completed by the evaluator):  Patient presented to the hospital voluntarily and was admitted for AVH.  Patient is a [age] year AA male with a primary diagnosis of schizophrenia.  Pt reports primary triggers for admission were hearing voices telling him to hurt himself and/or others.  Pt now denies SI/HI/AVH.  Patient [place] in [place], [place]  Pt lists supports in the community as his mother.  Patient will benefit from crisis stabilization, medication evaluation, group therapy, and psycho education in addition to case management for discharge planning. Patient and CSW reviewed pt's identified goals and treatment plan. Pt verbalized understanding and agreed to treatment plan.  At discharge it is recommended that patient remain compliant with established plan and continue treatment.  Charles Charles. 05/07/2016
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
## 2016-05-07 NOTE — Progress Notes (Signed)
D: Patient stated slept good last night .Stated appetite is good and energy level  Is normal. Stated concentration is good . Stated on Depression scale 0, hopeless 0 and anxiety 0 .( low 0-10 high) Denies suicidal  homicidal ideations  .  No auditory hallucinations  No pain concerns . Appropriate ADL'S. Interacting with peers and staff. Patient voice of auditory hallucinations.  Patient  Wanting to go to long term  Rehab. A: Encourage patient participation with unit programming . Instruction  Given on  Medication , verbalize understanding. R: Voice no other concerns. Staff continue to monitor

## 2016-05-07 NOTE — Tx Team (Signed)
Interdisciplinary Treatment and Diagnostic Plan Update  05/07/2016 Time of Session: 10:30am Charles Charles MRN: [id number]  Principal Diagnosis: <principal problem not specified>  Secondary Diagnoses: Active Problems:   Cocaine use disorder, moderate, dependence (HCC)   Adult hypothyroidism   Undifferentiated schizophrenia (HCC)   Alcohol use disorder, moderate, dependence (HCC)   Tobacco use disorder   Cannabis use disorder, moderate, dependence (HCC)   Current Medications:  Current Facility-Administered Medications  Medication Dose Route Frequency Provider Last Rate Last Dose  . acetaminophen (TYLENOL) tablet 650 mg  650 mg Oral Q6H PRN Audery AmelJohn T Clapacs, MD      . alum & mag hydroxide-simeth (MAALOX/MYLANTA) 200-200-20 MG/5ML suspension 30 mL  30 mL Oral Q4H PRN Audery AmelJohn T Clapacs, MD      . aspirin EC tablet 81 mg  81 mg Oral Daily Audery AmelJohn T Clapacs, MD   81 mg at 05/07/16 0826  . benztropine (COGENTIN) tablet 0.5 mg  0.5 mg Oral QHS Audery AmelJohn T Clapacs, MD   0.5 mg at 05/06/16 2123  . famotidine (PEPCID) tablet 20 mg  20 mg Oral Daily Audery AmelJohn T Clapacs, MD   20 mg at 05/07/16 [id number]  . haloperidol (HALDOL) tablet 5 mg  5 mg Oral QHS Audery AmelJohn T Clapacs, MD   5 mg at 05/06/16 2122  . hydrOXYzine (ATARAX/VISTARIL) tablet 25 mg  25 mg Oral TID PRN Audery AmelJohn T Clapacs, MD      . levothyroxine (SYNTHROID, LEVOTHROID) tablet 50 mcg  50 mcg Oral QAC breakfast Audery AmelJohn T Clapacs, MD   50 mcg at 05/07/16 0650  . magnesium hydroxide (MILK OF MAGNESIA) suspension 30 mL  30 mL Oral Daily PRN Audery AmelJohn T Clapacs, MD      . traZODone (DESYREL) tablet 50 mg  50 mg Oral QHS PRN Audery AmelJohn T Clapacs, MD   50 mg at 05/06/16 2124   PTA Medications: Prescriptions Prior to Admission  Medication Sig Dispense Refill Last Dose  . aspirin EC 81 MG EC tablet Take 1 tablet (81 mg total) by mouth daily. 30 tablet 1   . benztropine (COGENTIN) 0.5 MG tablet Take 1 tablet (0.5 mg total) by mouth at bedtime. 30 tablet 1   . famotidine (PEPCID) 20 MG  tablet Take 1 tablet (20 mg total) by mouth 2 (two) times daily. 60 tablet 1   . haloperidol (HALDOL) 5 MG tablet Take 1 tablet (5 mg total) by mouth at bedtime. 30 tablet 1   . ibuprofen (ADVIL,MOTRIN) 600 MG tablet Take 1 tablet (600 mg total) by mouth every 6 (six) hours as needed. 30 tablet 0   . levothyroxine (SYNTHROID, LEVOTHROID) 50 MCG tablet Take 1 tablet (50 mcg total) by mouth daily before breakfast. 30 tablet 1   . Polyethylene Glycol POWD Take 17 g by mouth daily. 1000 g 0   . traZODone (DESYREL) 50 MG tablet Take 1 tablet (50 mg total) by mouth at bedtime. 30 tablet 1     Patient Stressors: Marital or family conflict Medication change or noncompliance Substance abuse  Patient Strengths: Active sense of humor Average or above average intelligence Communication skills  Treatment Modalities: Medication Management, Group therapy, Case management,  1 to 1 session with clinician, Psychoeducation, Recreational therapy.   Physician Treatment Plan for Primary Diagnosis: <principal problem not specified> Long Term Goal(s): Improvement in symptoms so as ready for discharge Improvement in symptoms so as ready for discharge   Short Term Goals: Ability to identify changes in lifestyle to reduce recurrence of condition will  improve Ability to verbalize feelings will improve Ability to disclose and discuss suicidal ideas Ability to demonstrate self-control will improve Ability to identify and develop effective coping behaviors will improve Ability to maintain clinical measurements within normal limits will improve Compliance with prescribed medications will improve Ability to identify changes in lifestyle to reduce recurrence of condition will improve Ability to demonstrate self-control will improve Ability to identify triggers associated with substance abuse/mental health issues will improve  Medication Management: Evaluate patient's response, side effects, and tolerance of  medication regimen.  Therapeutic Interventions: 1 to 1 sessions, Unit Group sessions and Medication administration.  Evaluation of Outcomes: Progressing  Physician Treatment Plan for Secondary Diagnosis: Active Problems:   Cocaine use disorder, moderate, dependence (HCC)   Adult hypothyroidism   Undifferentiated schizophrenia (HCC)   Alcohol use disorder, moderate, dependence (HCC)   Tobacco use disorder   Cannabis use disorder, moderate, dependence (HCC)  Long Term Goal(s): Improvement in symptoms so as ready for discharge Improvement in symptoms so as ready for discharge   Short Term Goals: Ability to identify changes in lifestyle to reduce recurrence of condition will improve Ability to verbalize feelings will improve Ability to disclose and discuss suicidal ideas Ability to demonstrate self-control will improve Ability to identify and develop effective coping behaviors will improve Ability to maintain clinical measurements within normal limits will improve Compliance with prescribed medications will improve Ability to identify changes in lifestyle to reduce recurrence of condition will improve Ability to demonstrate self-control will improve Ability to identify triggers associated with substance abuse/mental health issues will improve     Medication Management: Evaluate patient's response, side effects, and tolerance of medication regimen.  Therapeutic Interventions: 1 to 1 sessions, Unit Group sessions and Medication administration.  Evaluation of Outcomes: Progressing   RN Treatment Plan for Primary Diagnosis: <principal problem not specified> Long Term Goal(s): Knowledge of disease and therapeutic regimen to maintain health will improve  Short Term Goals: Ability to remain free from injury will improve, Ability to participate in decision making will improve, Ability to identify and develop effective coping behaviors will improve and Compliance with prescribed medications  will improve  Medication Management: RN will administer medications as ordered by provider, will assess and evaluate patient's response and provide education to patient for prescribed medication. RN will report any adverse and/or side effects to prescribing provider.  Therapeutic Interventions: 1 on 1 counseling sessions, Psychoeducation, Medication administration, Evaluate responses to treatment, Monitor vital signs and CBGs as ordered, Perform/monitor CIWA, COWS, AIMS and Fall Risk screenings as ordered, Perform wound care treatments as ordered.  Evaluation of Outcomes: Progressing   LCSW Treatment Plan for Primary Diagnosis: <principal problem not specified> Long Term Goal(s): Safe transition to appropriate next level of care at discharge, Engage patient in therapeutic group addressing interpersonal concerns.  Short Term Goals: Engage patient in aftercare planning with referrals and resources, Increase social support, Increase ability to appropriately verbalize feelings, Increase emotional regulation, Facilitate acceptance of mental health diagnosis and concerns and Increase skills for wellness and recovery  Therapeutic Interventions: Assess for all discharge needs, 1 to 1 time with Social worker, Explore available resources and support systems, Assess for adequacy in community support network, Educate family and significant other(s) on suicide prevention, Complete Psychosocial Assessment, Interpersonal group therapy.  Evaluation of Outcomes: Progressing   Progress in Treatment: Attending groups: No. CSW still assessing, pt new to milieu Participating in groups: No. CSW still assessing, pt new to milieu Taking medication as prescribed: Yes. Toleration medication:  Yes. Family/Significant other contact made: No, will contact:  pt's mother Patient understands diagnosis: Yes. Discussing patient identified problems/goals with staff: Yes Medical problems stabilized or resolved: Yes. Denies  suicidal/homicidal ideation: Yes. Issues/concerns per patient self-inventory: Yes. Other: none reported  New problem(s) identified: No, Describe:  none reported  New Short Term/Long Term Goal(s):  Discharge Plan or Barriers: CSW still assessing for appropriate referrals   Reason for Continuation of Hospitalization: Anxiety Depression Hallucinations Suicidal ideation Withdrawal symptoms  Estimated Length of Stay: 3-5 days  Attendees: Nigel BridgemanGarry Charles, pt Patient:Charles Charles, Charles Charles 05/07/2016 10:46 AM  Physician: Dr. Jennet MaduroPucilowska, MD 05/07/2016 10:46 AM  Nursing: Hulan AmatoGwen Farrish, RN 05/07/2016 10:46 AM  RN Care Manager: 05/07/2016 10:46 AM  Social Worker: Dorothe PeaJonathan F. Roylene ReasonRiffey, LCSWA, LCAS 05/07/2016 10:46 AM  Recreational Therapist: Hershal CoriaBeth Greene, LRT 05/07/2016 10:46 AM  Other:  05/07/2016 10:46 AM  Other:  05/07/2016 10:46 AM  Other: 05/07/2016 10:46 AM    Scribe for Treatment Team: Dorothe PeaJonathan F Lynna Zamorano, LCSWA 05/07/2016 10:46 AM

## 2016-05-07 NOTE — Plan of Care (Signed)
Problem: Coping: Goal: Ability to verbalize frustrations and anger appropriately will improve Outcome: Progressing Patient was encouraged to express feelings and frustrations; clinical support provided, maintained on 15 minutes check for safety.

## 2016-05-07 NOTE — Plan of Care (Signed)
Problem: Coping: Goal: Ability to cope will improve Outcome: Progressing Patient working [employer] Corporationeon coping skills

## 2016-05-07 NOTE — [facility] Group Notes (Signed)
[facility] Group Notes:  (Nursing/MHT/Case Management/Adjunct)  Date:  05/07/2016  Time:  5:22 PM  Type of Therapy:  Psychoeducational Skills  Participation Level:  Active  Participation Quality:  Appropriate, Attentive and Sharing  Affect:  Appropriate  Cognitive:  Alert and Appropriate  Insight:  Appropriate  Engagement in Group:  Engaged  Modes of Intervention:  Discussion, Education and Support  Summary of Progress/Problems:  Lynelle SmokeCara Travis Jaeson Molstad 05/07/2016, 5:22 PM

## 2016-05-07 NOTE — Progress Notes (Signed)
A&Ox3, denied pain, endorsed Auditory hallucinations, "voices telling me to hurt myself and others, that's why I stay in my room and away from others..." Pleasant, polite, persuaded to come out for snacks and medications, minimal interactions with others; no aggressive behaviors noted, reported or verbalized.

## 2016-05-07 NOTE — Progress Notes (Signed)
Patient voice of wanting to see a doctor . Stated he has a boil at his rectum and it has some drainage . Patient wearing a pad . Writer  Looked at the site open with drainage  Between buttocks cheeks . Small amount noted with drainage

## 2016-05-07 NOTE — H&P (Signed)
Psychiatric Admission Assessment Adult  Patient Identification: Charles Charles MRN:  [id number] Date of Evaluation:  05/07/2016 Chief Complaint:  schizophrenia Principal Diagnosis: <principal problem not specified> Diagnosis:   Patient Active Problem List   Diagnosis Date Noted  . Noncompliance [Z91.19] 05/06/2016  . Cannabis use disorder, moderate, dependence ([place]) [F12.20] 05/06/2016  . Chest pain [R07.9] 04/14/2016  . Abdominal pain, epigastric [R10.13] 04/14/2016  . Undifferentiated schizophrenia ([place]) [F20.3] 04/10/2016  . Alcohol use disorder, moderate, dependence ([place]) [F10.20] 04/10/2016  . GERD (gastroesophageal reflux disease) [K21.9] 04/10/2016  . Tobacco use disorder [F17.200] 04/10/2016  . Suicidal ideation [R45.851] 04/09/2016  . Stimulant abuse [F15.10] 12/14/2014  . Cocaine use disorder, moderate, dependence ([place]) [F14.20] 12/17/2013  . Adult hypothyroidism [E03.9] 12/17/2013  . Blurred vision [H53.8] 12/17/2013   History of Present Illness:   Identifying data. [name] is a 55 year old male with a history of schizophrenia and substance abuse.  Chief complaint. "I hear voices."  History of present illness. Information was obtained from the patient and the chart. The patient has a long history of schizophrenia. He responds well to Haldol but has not been compliant with oral preparation and consistently refuses injectable long-lasting Haldol decanoate. He came to the hospital complaining of auditory command hallucinations telling him to kill himself and his mother. He came to the emergency room asking for help. He reports some symptoms of depression with poor sleep, decreased appetite, anhedonia, feeling of guilt, social isolation, and heightened anxiety. She relapsed on cocaine and cannabis. He denies heavy alcohol intake or heavy drug use. Following his last hospitalization, the patient was discharged to a boarding house and was supposed to follow up with SA IOP at [facility].  His mother was supposed to help him with transportation. The patient never started treatment as he "disappeared" from his apartment and could not be found. I do not believe that this patient take any medications at home and his major problem at present is substance abuse. He now appears to be interested in long term residential substance abuse treatment program participation. When we reviewed his option he made sure to let us know that he is unable to work because "the voices tell him not to".  Past psychiatric history. There is a long history of schizophrenia with multiple hospitalizations including at [facility]. There is also history of substance use. The patient denies attempting suicide. He is unable to name any medications tried before but is certain that he does not want any injectables as he is afraid of needles.  Family psychiatric history. Nonreported.  Social history. He is disabled from mental illness. He lives in an apartment now. He had 7 children. One of them died on 2023-05-14 years ago but It still makes him sad. His elderly mother is his only relatives and support but they have a very difficult relationship and argued frequently.   Total Time spent with patient: 1 hour  Is the patient at risk to self? Yes.    Has the patient been a risk to self in the past 6 months? Yes.    Has the patient been a risk to self within the distant past? No.  Is the patient a risk to others? Yes.    Has the patient been a risk to others in the past 6 months? Yes.    Has the patient been a risk to others within the distant past? No.   Prior Inpatient Therapy:   Prior Outpatient Therapy:    Alcohol Screening:  1. How often do you have a drink containing alcohol?: Monthly or less 2. How many drinks containing alcohol do you have on a typical day when you are drinking?: 1 or 2 3. How often do you have six or more drinks on one occasion?: Never Preliminary Score: 0 4. How often  during the last year have you found that you were not able to stop drinking once you had started?: Never 5. How often during the last year have you failed to do what was normally expected from you becasue of drinking?: Never 6. How often during the last year have you needed a first drink in the morning to get yourself going after a heavy drinking session?: Never 7. How often during the last year have you had a feeling of guilt of remorse after drinking?: Never 8. How often during the last year have you been unable to remember what happened the night before because you had been drinking?: Never 9. Have you or someone else been injured as a result of your drinking?: No 10. Has a relative or friend or a doctor or another health worker been concerned about your drinking or suggested you cut down?: No Alcohol Use Disorder Identification Test Final Score (AUDIT): 1 Brief Intervention: AUDIT score less than 7 or less-screening does not suggest unhealthy drinking-brief intervention not indicated Substance Abuse History in the last 12 months:  Yes.   Consequences of Substance Abuse: Negative Previous Psychotropic Medications: Yes  Psychological Evaluations: No  Past Medical History:  Past Medical History:  Diagnosis Date  . GERD (gastroesophageal reflux disease)   . Headache   . Schizo-affective schizophrenia [facility])     Past Surgical History:  Procedure Laterality Date  . COLONOSCOPY WITH PROPOFOL N/A 08/04/2015   Procedure: COLONOSCOPY WITH PROPOFOL;  Surgeon: Lucilla Lame, MD;  Location: [place];  Service: Endoscopy;  Laterality: N/A;  . INCISION AND DRAINAGE PERIRECTAL ABSCESS N/A 07/14/2015   Procedure: IRRIGATION AND DEBRIDEMENT PERIRECTAL ABSCESS;  Surgeon: Florene Glen, MD;  Location: [facility];  Service: General;  Laterality: N/A;  . INCISION AND DRAINAGE PERIRECTAL ABSCESS N/A 08/14/2015   Procedure: IRRIGATION AND DEBRIDEMENT PERIRECTAL ABSCESS;  Surgeon: Hubbard Robinson, MD;   Location: [facility];  Service: General;  Laterality: N/A;   Family History:  Family History  Problem Relation Age of Onset  . Anxiety disorder Mother   . Diabetes Mother   . Diabetes Father    Tobacco Screening: Have you used any form of tobacco in the last 30 days? (Cigarettes, Smokeless Tobacco, Cigars, and/or Pipes): Yes Tobacco use, Select all that apply: 5 or more cigarettes per day Are you interested in Tobacco Cessation Medications?: Yes, will notify MD for an order Counseled patient on smoking cessation including recognizing danger situations, developing coping skills and basic information about quitting provided: Yes Social History:  History  Alcohol Use  . 33.6 oz/week  . 25 Cans of beer per week    Comment: 2 x 40's daily - pt says no alcohol for several weeks     History  Drug Use No    Comment: back in the days    Additional Social History:                           Allergies:   Allergies  Allergen Reactions  . Penicillins Anaphylaxis, Hives and Other (See Comments)    Has patient had a PCN reaction causing immediate rash, facial/tongue/throat swelling,  SOB or lightheadedness with hypotension: Yes Has patient had a PCN reaction causing severe rash involving mucus membranes or skin necrosis: No Has patient had a PCN reaction that required hospitalization No Has patient had a PCN reaction occurring within the last 10 years: Yes If all of the above answers are "NO", then may proceed with Cephalosporin use.   Lab Results:  Results for orders placed or performed during the hospital encounter of 05/05/16 (from the past 48 hour(s))  Comprehensive metabolic panel     Status: Abnormal   Collection Time: 05/05/16  3:45 PM  Result Value Ref Range   Sodium 139 135 - 145 mmol/L   Potassium 3.8 3.5 - 5.1 mmol/L   Chloride 109 101 - 111 mmol/L   CO2 25 22 - 32 mmol/L   Glucose, Bld 113 (H) 65 - 99 mg/dL   BUN 15 6 - 20 mg/dL   Creatinine, Ser 0.87 0.61 - 1.24  mg/dL   Calcium 8.8 (L) 8.9 - 10.3 mg/dL   Total Protein 7.0 6.5 - 8.1 g/dL   Albumin 3.2 (L) 3.5 - 5.0 g/dL   AST 20 15 - 41 U/L   ALT 14 (L) 17 - 63 U/L   Alkaline Phosphatase 71 38 - 126 U/L   Total Bilirubin 0.3 0.3 - 1.2 mg/dL   GFR calc non Af Amer >60 >60 mL/min   GFR calc Af Amer >60 >60 mL/min    Comment: (NOTE) The eGFR has been calculated using the CKD EPI equation. This calculation has not been validated in all clinical situations. eGFR's persistently <60 mL/min signify possible Chronic Kidney Disease.    Anion gap 5 5 - 15  Ethanol     Status: None   Collection Time: 05/05/16  3:45 PM  Result Value Ref Range   Alcohol, Ethyl (B) <5 <5 mg/dL    Comment:        LOWEST DETECTABLE LIMIT FOR SERUM ALCOHOL IS 5 mg/dL FOR MEDICAL PURPOSES ONLY   Salicylate level     Status: None   Collection Time: 05/05/16  3:45 PM  Result Value Ref Range   Salicylate Lvl <9.5 2.8 - 30.0 mg/dL  Acetaminophen level     Status: Abnormal   Collection Time: 05/05/16  3:45 PM  Result Value Ref Range   Acetaminophen (Tylenol), Serum <10 (L) 10 - 30 ug/mL    Comment:        THERAPEUTIC CONCENTRATIONS VARY SIGNIFICANTLY. A RANGE OF 10-30 ug/mL MAY BE AN EFFECTIVE CONCENTRATION FOR MANY PATIENTS. HOWEVER, SOME ARE BEST TREATED AT CONCENTRATIONS OUTSIDE THIS RANGE. ACETAMINOPHEN CONCENTRATIONS >150 ug/mL AT 4 HOURS AFTER INGESTION AND >50 ug/mL AT 12 HOURS AFTER INGESTION ARE OFTEN ASSOCIATED WITH TOXIC REACTIONS.   cbc     Status: Abnormal   Collection Time: 05/05/16  3:45 PM  Result Value Ref Range   WBC 7.0 3.8 - 10.6 K/uL   RBC 4.10 (L) 4.40 - 5.90 MIL/uL   Hemoglobin 13.8 13.0 - 18.0 g/dL   HCT 38.6 (L) 40.0 - 52.0 %   MCV 94.3 80.0 - 100.0 fL   MCH 33.6 26.0 - 34.0 pg   MCHC 35.7 32.0 - 36.0 g/dL   RDW 13.6 11.5 - 14.5 %   Platelets 334 150 - 440 K/uL  Urine Drug Screen, Qualitative     Status: Abnormal   Collection Time: 05/05/16  3:45 PM  Result Value Ref Range    Tricyclic, Ur Screen NONE DETECTED NONE DETECTED   Amphetamines,  Ur Screen NONE DETECTED NONE DETECTED   MDMA (Ecstasy)Ur Screen NONE DETECTED NONE DETECTED   Cocaine Metabolite,Ur [place] POSITIVE (A) NONE DETECTED   Opiate, Ur Screen NONE DETECTED NONE DETECTED   Phencyclidine (PCP) Ur S NONE DETECTED NONE DETECTED   Cannabinoid 50 Ng, Ur [place] POSITIVE (A) NONE DETECTED   Barbiturates, Ur Screen NONE DETECTED NONE DETECTED   Benzodiazepine, Ur Scrn NONE DETECTED NONE DETECTED   Methadone Scn, Ur NONE DETECTED NONE DETECTED    Comment: (NOTE) 299  Tricyclics, urine               Cutoff 1000 ng/mL 200  Amphetamines, urine             Cutoff 1000 ng/mL 300  MDMA (Ecstasy), urine           Cutoff 500 ng/mL 400  Cocaine Metabolite, urine       Cutoff 300 ng/mL 500  Opiate, urine                   Cutoff 300 ng/mL 600  Phencyclidine (PCP), urine      Cutoff 25 ng/mL 700  Cannabinoid, urine              Cutoff 50 ng/mL 800  Barbiturates, urine             Cutoff 200 ng/mL 900  Benzodiazepine, urine           Cutoff 200 ng/mL 1000 Methadone, urine                Cutoff 300 ng/mL 1100 1200 The urine drug screen provides only a preliminary, unconfirmed 1300 analytical test result and should not be used for non-medical 1400 purposes. Clinical consideration and professional judgment should 1500 be applied to any positive drug screen result due to possible 1600 interfering substances. A more specific alternate chemical method 1700 must be used in order to obtain a confirmed analytical result.  1800 Gas chromato graphy / mass spectrometry (GC/MS) is the preferred 1900 confirmatory method.     Blood Alcohol level:  Lab Results  Component Value Date   ETH <5 05/05/2016   ETH 61 (H) 37/16/9678    Metabolic Disorder Labs:  Lab Results  Component Value Date   HGBA1C 5.5 04/11/2016   MPG 111 04/11/2016   No results found for: PROLACTIN Lab Results  Component Value Date   CHOL 162 04/11/2016    TRIG 75 04/11/2016   HDL 62 04/11/2016   CHOLHDL 2.6 04/11/2016   VLDL 15 04/11/2016   LDLCALC 85 04/11/2016   LDLCALC 76 01/27/2014    Current Medications: Current Facility-Administered Medications  Medication Dose Route Frequency Provider Last Rate Last Dose  . acetaminophen (TYLENOL) tablet 650 mg  650 mg Oral Q6H PRN Gonzella Lex, MD      . alum & mag hydroxide-simeth (MAALOX/MYLANTA) 200-200-20 MG/5ML suspension 30 mL  30 mL Oral Q4H PRN Gonzella Lex, MD      . aspirin EC tablet 81 mg  81 mg Oral Daily Gonzella Lex, MD   81 mg at 05/07/16 0826  . benztropine (COGENTIN) tablet 0.5 mg  0.5 mg Oral QHS Gonzella Lex, MD   0.5 mg at 05/06/16 2123  . famotidine (PEPCID) tablet 20 mg  20 mg Oral Daily Gonzella Lex, MD   20 mg at 05/07/16 0826  . haloperidol (HALDOL) tablet 5 mg  5 mg Oral QHS Gonzella Lex, MD  5 mg at 05/06/16 2122  . hydrOXYzine (ATARAX/VISTARIL) tablet 25 mg  25 mg Oral TID PRN Gonzella Lex, MD      . levothyroxine (SYNTHROID, LEVOTHROID) tablet 50 mcg  50 mcg Oral QAC breakfast Gonzella Lex, MD   50 mcg at 05/07/16 0650  . magnesium hydroxide (MILK OF MAGNESIA) suspension 30 mL  30 mL Oral Daily PRN Gonzella Lex, MD      . traZODone (DESYREL) tablet 50 mg  50 mg Oral QHS PRN Gonzella Lex, MD   50 mg at 05/06/16 2124   PTA Medications: Prescriptions Prior to Admission  Medication Sig Dispense Refill Last Dose  . aspirin EC 81 MG EC tablet Take 1 tablet (81 mg total) by mouth daily. 30 tablet 1   . benztropine (COGENTIN) 0.5 MG tablet Take 1 tablet (0.5 mg total) by mouth at bedtime. 30 tablet 1   . famotidine (PEPCID) 20 MG tablet Take 1 tablet (20 mg total) by mouth 2 (two) times daily. 60 tablet 1   . haloperidol (HALDOL) 5 MG tablet Take 1 tablet (5 mg total) by mouth at bedtime. 30 tablet 1   . ibuprofen (ADVIL,MOTRIN) 600 MG tablet Take 1 tablet (600 mg total) by mouth every 6 (six) hours as needed. 30 tablet 0   . levothyroxine (SYNTHROID,  LEVOTHROID) 50 MCG tablet Take 1 tablet (50 mcg total) by mouth daily before breakfast. 30 tablet 1   . Polyethylene Glycol POWD Take 17 g by mouth daily. 1000 g 0   . traZODone (DESYREL) 50 MG tablet Take 1 tablet (50 mg total) by mouth at bedtime. 30 tablet 1     Musculoskeletal: Strength & Muscle Tone: within normal limits Gait & Station: normal Patient leans: N/A  Psychiatric Specialty Exam:  Physical Exam  Nursing note and vitals reviewed. Constitutional: He is oriented to person, place, and time. He appears well-developed and well-nourished.  HENT:  Head: Normocephalic and atraumatic.  Eyes: Conjunctivae and EOM are normal. Pupils are equal, round, and reactive to light.  Neck: Normal range of motion. Neck supple.  Cardiovascular: Normal rate, regular rhythm and normal heart sounds.   Respiratory: Effort normal and breath sounds normal.  GI: Soft. Bowel sounds are normal.  Musculoskeletal: Normal range of motion.  Neurological: He is alert and oriented to person, place, and time.  Skin: Skin is warm and dry.    Review of Systems  Psychiatric/Behavioral: Positive for depression, hallucinations and substance abuse.  All other systems reviewed and are negative.   Blood pressure 96/60, pulse 61, temperature 98.1 F (36.7 C), temperature source Oral, resp. rate 18, height 5' 5" (1.651 m), weight 67.6 kg (149 lb), SpO2 98 %.Body mass index is 24.79 kg/m.  See SRA.                                                  Sleep:  Number of Hours: 7.3    Treatment Plan Summary: Daily contact with patient to assess and evaluate symptoms and progress in treatment and Medication management   Mr. Kendall is a 55 year old male with history of schizophrenia and substance use admitted for worsening of psychosis, suicidal or homicidal ideation, and substance use in the context of treatment noncompliance.  1. Suicidal and homicidal ideation. The patient is able to contract  for safety in the  hospital.    2. Psychosis. He was restarted again on Haldol and Cogentin for psychosis. We offered Haldol decanoate injection to improve compliance but the patient refused.  3. Insomnia. Trazodone was available.  4. GERD. He is on Pepcid.  5. Metabolic syndrome monitoring. Labs were performed during his recent hospitalization.    6. Substance abuse. The patient denies heavy drinking but was positive for cocaine and cannabis. There were no symptoms of alcohol withdrawal.    7. Hypothyroidism. He is on Synthroid.  8. Substance abuse treatment. The patient is interested in residential, long term treatment.    9. Disposition. TBE.    Observation Level/Precautions:  15 minute checks  Laboratory:  CBC Chemistry Profile UDS UA  Psychotherapy:    Medications:    Consultations:    Discharge Concerns:    Estimated LOS:  Other:     Physician Treatment Plan for Primary Diagnosis: <principal problem not specified> Long Term Goal(s): Improvement in symptoms so as ready for discharge  Short Term Goals: Ability to identify changes in lifestyle to reduce recurrence of condition will improve, Ability to verbalize feelings will improve, Ability to disclose and discuss suicidal ideas, Ability to demonstrate self-control will improve, Ability to identify and develop effective coping behaviors will improve, Ability to maintain clinical measurements within normal limits will improve and Compliance with prescribed medications will improve  Physician Treatment Plan for Secondary Diagnosis: Active Problems:   Cocaine use disorder, moderate, dependence ([facility])   Adult hypothyroidism   Undifferentiated schizophrenia ([place])   Alcohol use disorder, moderate, dependence ([place])   Tobacco use disorder   Cannabis use disorder, moderate, dependence ([place])  Long Term Goal(s): Improvement in symptoms so as ready for discharge  Short Term Goals: Ability to identify changes in lifestyle  to reduce recurrence of condition will improve, Ability to demonstrate self-control will improve and Ability to identify triggers associated with substance abuse/mental health issues will improve  I certify that inpatient services furnished can reasonably be expected to improve the patient's condition.    Orson Slick, MD 11/7/201710:30 AM

## 2016-05-07 NOTE — Progress Notes (Signed)
Recreation Therapy Notes  Date: 11.07.17 Time: 9:30 am Location: Craft Room  Group Topic: Goal Setting  Goal Area(s) Addresses:  Patient will write one goal. Patient will verbalize benefit of setting goals.  Behavioral Response: Attentive, Left early  Intervention: Step By Step  Activity: Patients were given a worksheet with a foot on it and were instructed to write their goal inside the foot. On the outside of the foot, patients were instructed to write positive statements to help them to keep working on their goals.  Education: LRT educated patients on ways to celebrate once they have reached their goals.  Education Outcome: Patient left before LRT educated group.  Clinical Observations/Feedback: Patient stated he could not write. LRT informed patient she would help him. Patient refused LRT's help. Patient left group at approximately 9:50 am and did not return to group.  Jacquelynn CreeGreene,Kingson Lohmeyer M, LRT/CTRS 05/07/2016 10:15 AM

## 2016-05-07 NOTE — B[facility] Counselor (Signed)
Adult Comprehensive Assessment  Patient ID: Charles Charles, male   DOB: 02/03/1961, 55 y.o.   MRN: [id number]  Information Source: Information source: Patient  Current Stressors:  Educational / Learning stressors: No stressors identified  Employment / Job issues: No stressors identified  Family Relationships: No stressors identified  Surveyor, quantityinancial / Lack of resources (include bankruptcy): Pt has no money until disability starts paying at first of month Housing / Lack of housing: Pt is homeless  Physical health (include injuries & life threatening diseases): Pt is hearing voices Social relationships: No stressors identified  Substance abuse: Alcohol and cocaine use  Bereavement / Loss: No stressors identified   Living/Environment/Situation:  Living Arrangements:Pt is homeless Living conditions (as described by patient or guardian): Unknown lived in current situation?: About two years  What is atmosphere in current home: Chaotic, Temporary  Family History:  Marital status: Separated Separated, when?: A couple years  What types of issues is patient dealing with in the relationship?: Unknown  What is your sexual orientation?: Straight  Has your sexual activity been affected by drugs, alcohol, medication, or emotional stress?: N/A Does patient have children?: Yes How many children?: 7 How is patient's relationship with their children?: 5 girls, two boys - does not see children very often   Childhood History:  By whom was/is the patient raised?: Mother Description of patient's relationship with caregiver when they were a child: "It was alright"  Patient's description of current relationship with people who raised him/her: "It was alright" How were you disciplined when you got in trouble as a child/adolescent?: Whoopings  Does patient have siblings?: Yes Number of Siblings: 2 Description of patient's current relationship with siblings: One brother and one sister; no relationship at  all Did patient suffer any verbal/emotional/physical/sexual abuse as a child?: No Did patient suffer from severe childhood neglect?: No Has patient ever been sexually abused/assaulted/raped as an adolescent or adult?: No Was the patient ever a victim of a crime or a disaster?: No Witnessed domestic violence?: No Has patient been effected by domestic violence as an adult?: No  Education:  Highest grade of school patient has completed: 8th or 9th grade Currently a student?: No Learning disability?: Yes (Can't read or write ) What learning problems does patient have?: Cannot read or write   Employment/Work Situation:   Employment situation: On disability Why is patient on disability: Mental health  How long has patient been on disability: Since 1998 Patient's job has been impacted by current illness: No What is the longest time patient has a held a job?: About two years  Where was the patient employed at that time?: [employer]  Has patient ever been in the Eli Lilly and Companymilitary?: No Has patient ever served in combat?: No Did You Receive Any Psychiatric Treatment/Services While in Equities traderthe Military?: No Are There Guns or Other Weapons in Your Home?: No Are These ComptrollerWeapons Safely Secured?:  (N/A)  Financial Resources:   Surveyor, quantityinancial resources: Sales executiveood stamps, Medicaid, Insurance claims handlereceives SSDI Does patient have a Lawyerrepresentative payee or guardian?: No  Alcohol/Substance Abuse:   What has been your use of drugs/alcohol within the last 12 months?: Alcohol and crack cocaine  If attempted suicide, did drugs/alcohol play a role in this?: No Alcohol/Substance Abuse Treatment Hx: Denies past history Has alcohol/substance abuse ever caused legal problems?: Yes (DUI)  Social Support System:   Patient's Community Support System: Fair Museum/gallery exhibitions officerDescribe Community Support System: Does not identify a strong support system at this time Type of faith/religion: None identified  How does patient's  faith help to cope with current illness?:  None identified   Leisure/Recreation:   Leisure and Hobbies: USe to love playing basketball - does not do anything currently  Strengths/Needs:   What things does the patient do well?: Good at playing basketball and dancing  In what areas does patient struggle / problems for patient: Reading, writing  Discharge Plan:   Does patient have access to transportation?: Pt's mother Plan for no access to transportation at discharge: [employer] bus Will patient be returning to same living situation after discharge?: Yes Currently receiving community mental health services: No (From Whom) ([facility] ) Does patient have financial barriers related to discharge medications?: No  Summary/Recommendations:   Summary and Recommendations (to be completed by the evaluator): Patient presented to the hospital voluntarily and was admitted for AVH.  Patient is a [age] year AA male with a primary diagnosis of schizophrenia.  Pt reports primary triggers for admission were hearing voices telling him to hurt himself and/or others.  Pt now denies SI/HI/AVH.  [place]  Pt lists supports in the community as his mother.  Patient will benefit from crisis stabilization, medication evaluation, group therapy, and psycho education in addition to case management for discharge planning. Patient and CSW reviewed pt's identified goals and treatment plan. Pt verbalized understanding and agreed to treatment plan.  At discharge it is recommended that patient remain compliant with established plan and continue treatment.  Charles Charles, MSW, LCSWA, LCAS  05/07/2016

## 2016-05-08 MED ORDER — HALOPERIDOL DECANOATE 100 MG/ML IM SOLN
50.0000 mg | INTRAMUSCULAR | Status: DC
  Administered 2016-05-08: 50 mg via INTRAMUSCULAR
  Filled 2016-05-08: qty 0.5

## 2016-05-08 NOTE — Progress Notes (Signed)
Better appearance, interacting well, reduced intrusive thoughts and auditory hallucination to hurt self and others; patient c/o 10/10 pain coming "from buttock, I have a boil and I need to see a doctor in the morning, it is becoming unbearable..." Additional Tylenol 650 mg given at 2025, verbalized partial relief of pain on re-assessment.

## 2016-05-08 NOTE — [facility] Group Notes (Signed)
[facility] LCSW Group Therapy Note  Date/Time:05/08/2016 1:00pm  Type of Therapy/Topic:  Group Therapy:  Emotion Regulation  Participation Level:  Active   Mood: Reports feeling tired he believes due to medications  Description of Group:    The purpose of this group is to assist patients in learning to regulate negative emotions and experience positive emotions. Patients will be guided to discuss ways in which they have been vulnerable to their negative emotions. These vulnerabilities will be juxtaposed with experiences of positive emotions or situations, and patients challenged to use positive emotions to combat negative ones. Special emphasis will be placed on coping with negative emotions in conflict situations, and patients will process healthy conflict resolution skills.  Therapeutic Goals: 1. Patient will identify two positive emotions or experiences to reflect on in order to balance out negative emotions:  2. Patient will label two or more emotions that they find the most difficult to experience:  3. Patient will be able to demonstrate positive conflict resolution skills through discussion or role plays:   Summary of Patient Progress: Pt able to achieve therapeutic goals.     Therapeutic Modalities:   Cognitive Behavioral Therapy Feelings Identification Dialectical Behavioral Therapy  Charles SharkSara Shaquala Charles, MSW, LCSW

## 2016-05-08 NOTE — Plan of Care (Signed)
Problem: Safety: Goal: Periods of time without injury will increase Outcome: Progressing .Medications compliant, Tylenol 650 PO PRN given, 15 minute checks maintained for safety, clinical and moral support provided, patient encouraged to continue to express feelings and demonstrate safe care. Patient remains free from harm, will continue to monitor.

## 2016-05-08 NOTE — [facility] Group Notes (Signed)
[facility] Group Notes:  (Nursing/MHT/Case Management/Adjunct)  Date:  05/08/2016  Time:  4:46 AM  Type of Therapy:  Psychoeducational Skills  Participation Level:  Active  Participation Quality:  Appropriate  Affect:  Appropriate  Cognitive:  Appropriate  Insight:  Appropriate and Good  Engagement in Group:  Engaged  Modes of Intervention:  Discussion, Socialization and Support  Summary of Progress/Problems:  Charles MilroyLaquanda Y Kelbie Moro 05/08/2016, 4:46 AM

## 2016-05-08 NOTE — Progress Notes (Signed)
D: Patient stated slept good last night .Stated appetite is good and energy level  Is normal. Stated concentration is good . Stated on Depression scale 0 , hopeless 0 and anxiety 0 .( low 0-10 high) Denies suicidal  homicidal ideations  .  No auditory hallucinations  No pain concerns . Appropriate ADL'S. Interacting with peers and staff. Continue to voice of wanting to see an MD about his buttocks . Voice concerns around withdrawal  A: Encourage patient participation with unit programming . Instruction  Given on  Medication , verbalize understanding. R: Voice no other concerns. Staff continue to monitor

## 2016-05-08 NOTE — Plan of Care (Signed)
Problem: Coping: Goal: Ability to verbalize feelings will improve Outcome: Progressing  Attending  Group  Hand out given on coping

## 2016-05-08 NOTE — Progress Notes (Signed)
Recreation Therapy Notes  Date: 11.08.17 Time: 9:30 am Location: Craft Room  Group Topic: Self-esteem  Goal Area(s) Addresses:  Patient will write at least one positive trait about self. Patient will verbalize benefit of having a healthy self-esteem.  Behavioral Response: Attentive  Intervention: I Am  Activity: Patients were given a worksheet with the letter I on it and were instructed to write as many positive traits about themselves inside the letter.  Education: LRT educated patients on ways they can increase their self-esteem.  Education Outcome: In group clarification offered  Clinical Observations/Feedback: Patient did not participate in group activity. Patient listened during group discussion.  Jacquelynn CreeGreene,Chrisanna Mishra M, LRT/CTRS 05/08/2016 11:48 AM

## 2016-05-08 NOTE — Progress Notes (Addendum)
[facility]  05/08/2016 12:10 PM Charles CaveGarry L Charles  MRN:  [id number]  Subjective:  Charles Charles still complains of auditory hallucinations and has been rather irritable, arguing with staff. He no longer feels suicidal or homicidal towards his mother. Moreover, he plans to move in with her, at least temporarily. This was not possible before. He accepts medications and tolerates them well. He agreed to haldol decanoate injection to improve compliance. He reports draining perirectal abscess. He had recent surgery for the same by Dr. Excell Seltzerooper. [facility].  Principal Problem: Undifferentiated schizophrenia (HCC) Diagnosis:   Patient Active Problem List   Diagnosis Date Noted  . Noncompliance [Z91.19] 05/06/2016  . Cannabis use disorder, moderate, dependence (HCC) [F12.20] 05/06/2016  . Chest pain [R07.9] 04/14/2016  . Abdominal pain, epigastric [R10.13] 04/14/2016  . Undifferentiated schizophrenia (HCC) [F20.3] 04/10/2016  . Alcohol use disorder, moderate, dependence (HCC) [F10.20] 04/10/2016  . GERD (gastroesophageal reflux disease) [K21.9] 04/10/2016  . Tobacco use disorder [F17.200] 04/10/2016  . Suicidal ideation [R45.851] 04/09/2016  . Stimulant abuse [F15.10] 12/14/2014  . Cocaine use disorder, moderate, dependence (HCC) [F14.20] 12/17/2013  . Adult hypothyroidism [E03.9] 12/17/2013  . Blurred vision [H53.8] 12/17/2013   Total Time spent with patient: 20 minutes  Past Psychiatric History: schizophrenia, substance abuse.  Past Medical History:  Past Medical History:  Diagnosis Date  . GERD (gastroesophageal reflux disease)   . Headache   . Schizo-affective schizophrenia [facility](HCC)     Past Surgical History:  Procedure Laterality Date  . COLONOSCOPY WITH PROPOFOL N/A 08/04/2015   Procedure: COLONOSCOPY WITH PROPOFOL;  Surgeon: Midge Miniumarren Wohl, MD;  [facility];  Service: Endoscopy;  Laterality: N/A;  . INCISION AND DRAINAGE  PERIRECTAL ABSCESS N/A 07/14/2015   Procedure: IRRIGATION AND DEBRIDEMENT PERIRECTAL ABSCESS;  Surgeon: Lattie Hawichard E Cooper, MD;  Location: [facility];  Service: General;  Laterality: N/A;  . INCISION AND DRAINAGE PERIRECTAL ABSCESS N/A 08/14/2015   Procedure: IRRIGATION AND DEBRIDEMENT PERIRECTAL ABSCESS;  Surgeon: Gladis Riffleatherine L Loflin, MD;  Location: [facility];  Service: General;  Laterality: N/A;   Family History:  Family History  Problem Relation Age of Onset  . Anxiety disorder Mother   . Diabetes Mother   . Diabetes Father    Family Psychiatric  History: see H&P. Social History:  History  Alcohol Use  . 33.6 oz/week  . 56 Cans of beer per week    Comment: 2 x 40's daily - pt says no alcohol for several weeks     History  Drug Use No    Comment: back in the days    Social History   Social History  . Marital status: Single    Spouse name: N/A  . Number of children: N/A  . Years of education: N/A   Social History Main Topics  . Smoking status: Current Every Day Smoker    Packs/day: 0.10    Types: Cigarettes  . Smokeless tobacco: Never Used     Comment: (1-2 cigs/day)  . Alcohol use 33.6 oz/week    56 Cans of beer per week     Comment: 2 x 40's daily - pt says no alcohol for several weeks  . Drug use: No     Comment: back in the days  . Sexual activity: Not Asked   Other Topics Concern  . None   Social History Narrative  . None   Additional Social History:  Sleep: Fair  Appetite:  Fair  Current Medications: Current Facility-Administered Medications  Medication Dose Route Frequency Provider Last Rate Last Dose  . acetaminophen (TYLENOL) tablet 650 mg  650 mg Oral Q6H PRN Audery AmelJohn T Clapacs, MD   650 mg at 05/08/16 0955  . alum & mag hydroxide-simeth (MAALOX/MYLANTA) 200-200-20 MG/5ML suspension 30 mL  30 mL Oral Q4H PRN Audery AmelJohn T Clapacs, MD      . aspirin EC tablet 81 mg  81 mg Oral Daily Audery AmelJohn T Clapacs, MD   81 mg at 05/08/16 0810  .  benztropine (COGENTIN) tablet 0.5 mg  0.5 mg Oral QHS Audery AmelJohn T Clapacs, MD   0.5 mg at 05/07/16 2105  . famotidine (PEPCID) tablet 20 mg  20 mg Oral Daily Audery AmelJohn T Clapacs, MD   20 mg at 05/08/16 0810  . haloperidol (HALDOL) tablet 5 mg  5 mg Oral QHS Audery AmelJohn T Clapacs, MD   5 mg at 05/07/16 2105  . haloperidol decanoate (HALDOL DECANOATE) 100 MG/ML injection 50 mg  50 mg Intramuscular Q30 days Bettejane Leavens B Altan Kraai, MD      . hydrOXYzine (ATARAX/VISTARIL) tablet 25 mg  25 mg Oral TID PRN Audery AmelJohn T Clapacs, MD      . levothyroxine (SYNTHROID, LEVOTHROID) tablet 50 mcg  50 mcg Oral QAC breakfast Audery AmelJohn T Clapacs, MD   50 mcg at 05/08/16 0646  . magnesium hydroxide (MILK OF MAGNESIA) suspension 30 mL  30 mL Oral Daily PRN Audery AmelJohn T Clapacs, MD      . traZODone (DESYREL) tablet 50 mg  50 mg Oral QHS PRN Audery AmelJohn T Clapacs, MD   50 mg at 05/06/16 2124    Lab Results: No results found for this or any previous visit (from the past 48 hour(s)).  Blood Alcohol level:  Lab Results  Component Value Date   ETH <5 05/05/2016   ETH 61 (H) 04/09/2016    Metabolic Disorder Labs: Lab Results  Component Value Date   HGBA1C 5.5 04/11/2016   MPG 111 04/11/2016   No results found for: PROLACTIN Lab Results  Component Value Date   CHOL 162 04/11/2016   TRIG 75 04/11/2016   HDL 62 04/11/2016   CHOLHDL 2.6 04/11/2016   VLDL 15 04/11/2016   LDLCALC 85 04/11/2016   LDLCALC 76 01/27/2014    Physical Findings: AIMS:  , ,  ,  ,    CIWA:    COWS:     Musculoskeletal: Strength & Muscle Tone: within normal limits Gait & Station: normal Patient leans: N/A  Psychiatric Specialty Exam: Physical Exam  Nursing note and vitals reviewed.   Review of Systems  Gastrointestinal:       Draining perirectal abscess.  Psychiatric/Behavioral: Positive for hallucinations and substance abuse.  All other systems reviewed and are negative.   Blood pressure 107/68, pulse 61, temperature 98.4 F (36.9 C), temperature source Oral,  resp. rate 18, height 5\' 5"  (1.651 m), weight 67.6 kg (149 lb), SpO2 98 %.Body mass index is 24.79 kg/m.  General Appearance: Casual  Eye Contact:  Good  Speech:  Clear and Coherent  Volume:  Normal  Mood:  Dysphoric  Affect:  Congruent  Thought Process:  Goal Directed and Descriptions of Associations: Intact  Orientation:  Full (Time, Place, and Person)  Thought Content:  Hallucinations: Auditory  Suicidal Thoughts:  No  Homicidal Thoughts:  No  Memory:  Immediate;   Fair Recent;   Fair Remote;   Fair  Judgement:  Poor  Insight:  Lacking  Psychomotor Activity:  Normal  Concentration:  Concentration: Fair and Attention Span: Fair  Recall:  [employer] of Knowledge:  Fair  Language:  Fair  Akathisia:  No  Handed:  Right  AIMS (if indicated):     Assets:  Communication Skills Desire for Improvement Financial Resources/Insurance Physical Health Resilience Social Support  ADL's:  Intact  Cognition:  WNL  Sleep:  Number of Hours: 8     Treatment Plan Summary: Daily contact with patient to assess and evaluate symptoms and progress in treatment and Medication management   Charles Charles is a 55 year old male with history of schizophrenia and substance use admitted for worsening of psychosis, suicidal or homicidal ideation, and substance use in the context of treatment noncompliance.  1. Suicidal and homicidal ideation. Resolved. The patient is able to contract for safety.    2. Psychosis. He was restarted again on Haldol and Cogentin for psychosis and agreed to Haldol decanoate injection to improve compliance.  3. Insomnia. Trazodone is available.  4. GERD. He is on Pepcid.  5. Metabolic syndrome monitoring. Labs were performed during his recent hospitalization.    6. Substance abuse. The patient denies heavy drinking but was positive for cocaine and cannabis. There are no symptoms of alcohol withdrawal.   7. Hypothyroidism. He is on Synthroid.  8. Substance abuse  treatment. The patient is somewhat interested in residential, long term treatment.    9. Perirectal abscess. Will discuss his case with the surgeon.   10. Disposition. TBE.    Kristine Linea, MD 05/08/2016, 12:10 PM

## 2016-05-08 NOTE — B[facility] Group Notes (Signed)
B[facility] Group Notes:  (Nursing/MHT/Case Management/Adjunct)  Date:  05/08/2016  Time:  3:48 PM  Type of Therapy:  Psychoeducational Skills  Participation Level:  Active  Participation Quality:  Appropriate, Sharing and Supportive  Affect:  Appropriate  Cognitive:  Appropriate  Insight:  Appropriate  Engagement in Group:  Engaged and Supportive  Modes of Intervention:  Discussion, Education and Support  Summary of Progress/Problems:  Noelle PennerKristen J Shell Yandow 05/08/2016, 3:48 PM

## 2016-05-09 MED ORDER — ASPIRIN 81 MG PO TBEC: 81.0000 mg | DELAYED_RELEASE_TABLET | Freq: Every day | ORAL | 1 refills | Status: DC

## 2016-05-09 MED ORDER — LEVOTHYROXINE SODIUM 50 MCG PO TABS: 50.0000 ug | ORAL_TABLET | Freq: Every day | ORAL | 1 refills | Status: DC

## 2016-05-09 MED ORDER — TRAZODONE HCL 50 MG PO TABS: 50.0000 mg | ORAL_TABLET | Freq: Every day | ORAL | 1 refills | Status: DC

## 2016-05-09 MED ORDER — BENZTROPINE MESYLATE 0.5 MG PO TABS: 0.5000 mg | ORAL_TABLET | Freq: Every day | ORAL | 1 refills | Status: DC

## 2016-05-09 MED ORDER — HALOPERIDOL 5 MG PO TABS: 5.0000 mg | ORAL_TABLET | Freq: Every day | ORAL | 1 refills | Status: DC

## 2016-05-09 MED ORDER — HALOPERIDOL DECANOATE 100 MG/ML IM SOLN: 50.0000 mg | INTRAMUSCULAR | 1 refills | Status: DC

## 2016-05-09 MED ORDER — FAMOTIDINE 20 MG PO TABS: 20.0000 mg | ORAL_TABLET | Freq: Two times a day (BID) | ORAL | 1 refills | Status: DC

## 2016-05-09 NOTE — B[facility] Group Notes (Signed)
[facility] Group Notes:  (Nursing/MHT/Case Management/Adjunct)  Date:  05/09/2016  Time:  3:45 AM  Type of Therapy:  Psychoeducational Skills  Participation Level:  Active  Participation Quality:  Appropriate  Affect:  Appropriate  Cognitive:  Appropriate  Insight:  Appropriate and Good  Engagement in Group:  Engaged  Modes of Intervention:  Discussion, Socialization and Support  Summary of Progress/Problems:  Chancy MilroyLaquanda Y Aysa Larivee 05/09/2016, 3:45 AM

## 2016-05-09 NOTE — [facility] Suicide Risk Assessment (Signed)
[facility]   Principal Problem: Undifferentiated schizophrenia [facility](HCC) Discharge Diagnoses:  Patient Active Problem List   Diagnosis Date Noted  . Noncompliance [Z91.19] 05/06/2016  . Cannabis use disorder, moderate, dependence (HCC) [F12.20] 05/06/2016  . Chest pain [R07.9] 04/14/2016  . Abdominal pain, epigastric [R10.13] 04/14/2016  . Undifferentiated schizophrenia (HCC) [F20.3] 04/10/2016  . Alcohol use disorder, moderate, dependence (HCC) [F10.20] 04/10/2016  . GERD (gastroesophageal reflux disease) [K21.9] 04/10/2016  . Tobacco use disorder [F17.200] 04/10/2016  . Suicidal ideation [R45.851] 04/09/2016  . Stimulant abuse [F15.10] 12/14/2014  . Cocaine use disorder, moderate, dependence (HCC) [F14.20] 12/17/2013  . Adult hypothyroidism [E03.9] 12/17/2013  . Blurred vision [H53.8] 12/17/2013    Total Time spent with patient: 30 minutes  Musculoskeletal: Strength & Muscle Tone: within normal limits Gait & Station: normal Patient leans: N/A  Psychiatric Specialty Exam: Review of Systems  Psychiatric/Behavioral: Positive for substance abuse.  All other systems reviewed and are negative.   Blood pressure 103/69, pulse (!) 57, temperature 98 F (36.7 C), temperature source Oral, resp. rate 18, height 5\' 5"  (1.651 m), weight 67.6 kg (149 lb), SpO2 98 %.Body mass index is 24.79 kg/m.  General Appearance: Casual  Eye Contact::  Good  Speech:  Clear and Coherent409  Volume:  Normal  Mood:  Euthymic  Affect:  Appropriate  Thought Process:  Goal Directed and Descriptions of Associations: Intact  Orientation:  Full (Time, Place, and Person)  Thought Content:  WDL  Suicidal Thoughts:  No  Homicidal Thoughts:  No  Memory:  Immediate;   Fair Recent;   Fair Remote;   Fair  Judgement:  Impaired  Insight:  Lacking  Psychomotor Activity:  Normal  Concentration:  Fair  Recall:  [employer]Fair  Fund of Knowledge:Fair  Language: Fair  Akathisia:  No  Handed:   Right  AIMS (if indicated):     Assets:  Communication Skills Desire for Improvement Financial Resources/Insurance Physical Health Resilience Social Support  Sleep:  Number of Hours: 7.15  Cognition: WNL  ADL's:  Intact   Mental Status Per Nursing Assessment::   On Admission:  Self-harm thoughts  Demographic Factors:  Male and Low socioeconomic status  Loss Factors: Financial problems/change in socioeconomic status  Historical Factors: Prior suicide attempts and Impulsivity  Risk Reduction Factors:   Sense of responsibility to family and Positive social support  Continued Clinical Symptoms:  Alcohol/Substance Abuse/Dependencies Schizophrenia:   Depressive state  Cognitive Features That Contribute To Risk:  None    Suicide Risk:  Minimal: No identifiable suicidal ideation.  Patients presenting with no risk factors but with morbid ruminations; may be classified as minimal risk based on the severity of the depressive symptoms    Plan Of Care/Follow-up recommendations:  Activity:  as tolerated. Diet:  low sodium heart healthy. Other:  keep follow up appointments.    Kristine LineaJolanta Faizah Kandler, MD 05/09/2016, 9:27 AM

## 2016-05-09 NOTE — B[facility] Group Notes (Signed)
B[facility] LCSW Group Therapy Note  Type of Therapy and Topic:  Group Therapy:  Goals Group: SMART Goals  Participation Level: Patient attended group on this date and minimally participated in the group discussion.    Description of Group:   The purpose of a daily goals group is to assist and guide patients in setting recovery/wellness-related goals.  The objective is to set goals as they relate to the crisis in which they were admitted. Patients will be using SMART goal modalities to set measurable goals.  Characteristics of realistic goals will be discussed and patients will be assisted in setting and processing how one will reach their goal. Facilitator will also assist patients in applying interventions and coping skills learned in psycho-education groups to the SMART goal and process how one will achieve defined goal.  Therapeutic Goals: -Patients will develop and document one goal related to or their crisis in which brought them into treatment. -Patients will be guided by LCSW using SMART goal setting modality in how to set a measurable, attainable, realistic and time sensitive goal.  -Patients will process barriers in reaching goal. -Patients will process interventions in how to overcome and successful in reaching goal.   Summary of Patient Progress:  Patient Goal: "I want to treat my parents better and take my medications when discharged".    Therapeutic Modalities:   Motivational Interviewing Engineer, manufacturing systemsCognitive Behavioral Therapy Crisis Intervention Model SMART goals setting  Charles Heyliger G. Garnette CzechSampson MSW, LCSWA 05/09/2016 10:35 AM

## 2016-05-09 NOTE — Plan of Care (Signed)
Problem: Coping: Goal: Ability to demonstrate self-control will improve Outcome: Progressing Social and peer interactions improved, no longer isolating to room, attending group and appropriate; will continue to provide clinical support.

## 2016-05-09 NOTE — Progress Notes (Signed)
Mood and Affect better, more positive interactions with others and staffs, "I got my shot today, (haldol decanoate injection), I will get it every 4 weeks ..." Rated rectal pain 8/10, Tylenol 650 mg given, "It is draining but I still need to see a doctor because I don't want it draining inside of me...." A&Ox3, denied SI/HI, denied AV/H; medications compliant.

## 2016-05-09 NOTE — [facility] Suicide Risk Assessment (Signed)
[facility] INPATIENT:  Family/Significant Other Suicide Prevention Education  Suicide Prevention Education:  Patient Refusal for Family/Significant Other Suicide Prevention Education: The patient Charles Charles has refused to provide written consent for family/significant other to be provided Family/Significant Other Suicide Prevention Education during admission and/or prior to discharge.  Physician notified. CSW completed SPE with the pt.    Dorothe PeaJonathan F Hagar Sadiq 05/09/2016, 2:39 PM

## 2016-05-09 NOTE — Progress Notes (Signed)
Recreation Therapy Notes  Date: 11.09.17 Time: 9:30 am Location: Craft Room  Group Topic: Leisure Education  Goal Area(s) Addresses:  Patient will identify activities for each letter of the alphabet. Patient will verbalize ability to integrate positive leisure into life post d/c. Patient will verbalize ability to use leisure as a Associate Professorcoping skill.  Behavioral Response: Attentive, Interactive  Intervention: Leisure Alphabet  Activity: Patients were given a Leisure Information systems managerAlphabet worksheet and were instructed to write healthy leisure activities for each letter of the alphabet.  Education: LRT educated patients on what they need to participate in leisure.  Education Outcome: In group clarification offered  Clinical Observations/Feedback: Patient did not work on worksheet. Patient contributed to group discussion by stating a healthy leisure activity.  Jacquelynn CreeGreene,Danni Leabo M, LRT/CTRS 05/09/2016 10:32 AM

## 2016-05-09 NOTE — Discharge Summary (Signed)
Physician Discharge Summary Note  Patient:  Charles Charles is an 55 y.o., male MRN:  [id number] DOB:  07/05/1960 Patient phone:  [phone] (home)  Patient address:   [address] [place] [address],  Total Time spent with patient: 30 minutes  Date of Admission:  05/06/2016 Date of Discharge: 05/09/2016  Reason for Admission:  Suicidal and homicidal ideation, substance abuse.  Identifying data. Charles Charles is a 55 year old male with a history of schizophrenia and substance abuse.  Chief complaint. "I hear voices."  History of present illness. Information was obtained from the patient and the chart. The patient has a long history of schizophrenia. He responds well to Haldol but has not been compliant with oral preparation and consistently refuses injectable long-lasting Haldol decanoate. He came to the hospital complaining of auditory command hallucinations telling him to kill himself and his mother. He came to the emergency room asking for help. He reports some symptoms of depression with poor sleep, decreased appetite, anhedonia, feeling of guilt, social isolation, and heightened anxiety. She relapsed on cocaine and cannabis. He denies heavy alcohol intake or heavy drug use. Following his last hospitalization, the patient was discharged to a boarding house and was [facility]. His mother was supposed to help him with transportation. The patient never started treatment as he "disappeared" from his apartment and could not be found. I do not believe that this patient take any medications at home and his major problem at present is substance abuse. He now appears to be interested in long term residential substance abuse treatment program participation. When we reviewed his option he made sure to let us know that he is unable to work because "the voices tell him not to".  Past psychiatric history. There is a long history of schizophrenia with multiple hospitalizations  [facility]. There is also history of substance use. The patient denies attempting suicide. He is unable to name any medications tried before but is certain that he does not want any injectables as he is afraid of needles.  Family psychiatric history. Nonreported.  Social history. He is disabled from mental illness. He lives in an apartment now. He had 7 children. One of them died on 10/26 years ago but It still makes him sad. His elderly mother is his only relatives and support but they have a very difficult relationship and argued frequently.   Principal Problem: Undifferentiated schizophrenia [facility](HCC) Discharge Diagnoses: Patient Active Problem List   Diagnosis Date Noted  . Noncompliance [Z91.19] 05/06/2016  . Cannabis use disorder, moderate, dependence (HCC) [F12.20] 05/06/2016  . Chest pain [R07.9] 04/14/2016  . Abdominal pain, epigastric [R10.13] 04/14/2016  . Undifferentiated schizophrenia (HCC) [F20.3] 04/10/2016  . Alcohol use disorder, moderate, dependence (HCC) [F10.20] 04/10/2016  . GERD (gastroesophageal reflux disease) [K21.9] 04/10/2016  . Tobacco use disorder [F17.200] 04/10/2016  . Suicidal ideation [R45.851] 04/09/2016  . Stimulant abuse [F15.10] 12/14/2014  . Cocaine use disorder, moderate, dependence (HCC) [F14.20] 12/17/2013  . Adult hypothyroidism [E03.9] 12/17/2013  . Blurred vision [H53.8] 12/17/2013   Past Medical History:  Past Medical History:  Diagnosis Date  . GERD (gastroesophageal reflux disease)   . Headache   . Schizo-affective schizophrenia [facility](HCC)     Past Surgical History:  Procedure Laterality Date  . COLONOSCOPY WITH PROPOFOL N/A 08/04/2015   Procedure: COLONOSCOPY WITH PROPOFOL;  Surgeon: Midge Miniumarren Wohl, MD;  [facility];  Service: Endoscopy;  Laterality: N/A;  . INCISION  AND DRAINAGE PERIRECTAL ABSCESS N/A 07/14/2015   Procedure: IRRIGATION AND DEBRIDEMENT PERIRECTAL ABSCESS;  Surgeon: Lattie Haw, MD;  Location: [facility] ORS;  Service: General;  Laterality: N/A;  . INCISION AND DRAINAGE PERIRECTAL ABSCESS N/A 08/14/2015   Procedure: IRRIGATION AND DEBRIDEMENT PERIRECTAL ABSCESS;  Surgeon: Gladis Riffle, MD;  Location: [facility] ORS;  Service: General;  Laterality: N/A;   Family History:  Family History  Problem Relation Age of Onset  . Anxiety disorder Mother   . Diabetes Mother   . Diabetes Father    Social History:  History  Alcohol Use  . 33.6 oz/week  . 56 Cans of beer per week    Comment: 2 x 40's daily - pt says no alcohol for several weeks     History  Drug Use No    Comment: back in the days    Social History   Social History  . Marital status: Single    Spouse name: N/A  . Number of children: N/A  . Years of education: N/A   Social History Main Topics  . Smoking status: Current Every Day Smoker    Packs/day: 0.10    Types: Cigarettes  . Smokeless tobacco: Never Used     Comment: (1-2 cigs/day)  . Alcohol use 33.6 oz/week    56 Cans of beer per week     Comment: 2 x 40's daily - pt says no alcohol for several weeks  . Drug use: No     Comment: back in the days  . Sexual activity: Not Asked   Other Topics Concern  . None   Social History Narrative  . None    Hospital Course:    Charles Charles is a 55 year old male with history of schizophrenia and substance use admitted for worsening of psychosis, suicidal or homicidal ideation, and substance use in the context of treatment noncompliance.  1. Suicidal and homicidal ideation. Resolved. The patient is able to contract for safety.   2. Psychosis. He was restarted again on Haldol and Cogentin for psychosis. He was given 50 mg of  Haldol decanoate injection on 05/07/2016 to improve compliance.  3. Insomnia. Trazodone was available.  4. GERD. He is on Pepcid.  5. Metabolic syndrome monitoring. Labs were performed during his recent hospitalization.   6. Substance abuse. The patient denies  heavy drinking but was positive for cocaine and cannabis. There are no symptoms of alcohol withdrawal.   7. Hypothyroidism. He is on Synthroid.  8. Substance abuse treatment. The patient was initially somewhat interested in residential, long term treatment but eventually changed his mind.  9. Perirectal abscess. I tried to obtain surgery consultation from [facility]. I spoke with 2 nurses but my call was not returned. We will make outpatient appointment with Dr. Excell Seltzer.   10. Disposition. He was discharged to home with his mother. He will follow up with SA IOP at [facility].  Physical Findings: AIMS:  , ,  ,  ,    CIWA:    COWS:     Musculoskeletal: Strength & Muscle Tone: within normal limits Gait & Station: normal Patient leans: N/A  Psychiatric Specialty Exam: Physical Exam  Nursing note and vitals reviewed.   Review of Systems  Gastrointestinal:       Draining perirectal abscess  Psychiatric/Behavioral: Positive for substance abuse.  All other systems reviewed and are negative.   Blood pressure 107/68, pulse 61, temperature 98.4 F (36.9 C), temperature source Oral, resp. rate 18, height 5'  5" (1.651 m), weight 67.6 kg (149 lb), SpO2 98 %.Body mass index is 24.79 kg/m.  General Appearance: Casual  Eye Contact:  Good  Speech:  Clear and Coherent  Volume:  Normal  Mood:  Anxious  Affect:  Appropriate  Thought Process:  Goal Directed and Descriptions of Associations: Intact  Orientation:  Full (Time, Place, and Person)  Thought Content:  WDL  Suicidal Thoughts:  No  Homicidal Thoughts:  No  Memory:  Immediate;   Fair Recent;   Fair Remote;   Fair  Judgement:  Poor  Insight:  Shallow  Psychomotor Activity:  Normal  Concentration:  Concentration: Fair and Attention Span: Fair  Recall:  [employer] of Knowledge:  Fair  Language:  Fair  Akathisia:  No  Handed:  Right  AIMS (if indicated):     Assets:  Communication Skills Desire for  Improvement Financial Resources/Insurance Physical Health Resilience Social Support  ADL's:  Intact  Cognition:  WNL  Sleep:  Number of Hours: 7.15     Have you used any form of tobacco in the last 30 days? (Cigarettes, Smokeless Tobacco, Cigars, and/or Pipes): Yes  Has this patient used any form of tobacco in the last 30 days? (Cigarettes, Smokeless Tobacco, Cigars, and/or Pipes) Yes, Yes, A prescription for an FDA-approved tobacco cessation medication was offered at discharge and the patient refused  Blood Alcohol level:  Lab Results  Component Value Date   ETH <5 05/05/2016   ETH 61 (H) 04/09/2016    Metabolic Disorder Labs:  Lab Results  Component Value Date   HGBA1C 5.5 04/11/2016   MPG 111 04/11/2016   No results found for: PROLACTIN Lab Results  Component Value Date   CHOL 162 04/11/2016   TRIG 75 04/11/2016   HDL 62 04/11/2016   CHOLHDL 2.6 04/11/2016   VLDL 15 04/11/2016   LDLCALC 85 04/11/2016   LDLCALC 76 01/27/2014    See Psychiatric Specialty Exam and Suicide Risk Assessment completed by Attending Physician prior to discharge.  Discharge destination:  Home  Is patient on multiple antipsychotic therapies at discharge:  No   Has Patient had three or more failed trials of antipsychotic monotherapy by history:  No  Recommended Plan for Multiple Antipsychotic Therapies: NA  Discharge Instructions    Diet - low sodium heart healthy    Complete by:  As directed    Increase activity slowly    Complete by:  As directed        Medication List    TAKE these medications     Indication  aspirin 81 MG EC tablet Take 1 tablet (81 mg total) by mouth daily.  Indication:  Heart Attack   benztropine 0.5 MG tablet Commonly known as:  COGENTIN Take 1 tablet (0.5 mg total) by mouth at bedtime.  Indication:  Extrapyramidal Reaction caused by Medications   famotidine 20 MG tablet Commonly known as:  PEPCID Take 1 tablet (20 mg total) by mouth 2 (two) times  daily.  Indication:  Gastroesophageal Reflux Disease   haloperidol 5 MG tablet Commonly known as:  HALDOL Take 1 tablet (5 mg total) by mouth at bedtime.  Indication:  Schizophrenia   haloperidol decanoate 100 MG/ML injection Commonly known as:  HALDOL DECANOATE Inject 0.5 mLs (50 mg total) into the muscle every 30 (thirty) days. Start taking on:  06/07/2016  Indication:  Schizophrenia   ibuprofen 600 MG tablet Commonly known as:  ADVIL,MOTRIN Take 1 tablet (600 mg total) by mouth every  6 (six) hours as needed.  Indication:  Joint Damage causing Pain and Loss of Function   levothyroxine 50 MCG tablet Commonly known as:  SYNTHROID, LEVOTHROID Take 1 tablet (50 mcg total) by mouth daily before breakfast.  Indication:  Underactive Thyroid   Polyethylene Glycol Powd Take 17 g by mouth daily.  Indication:  constipation   traZODone 50 MG tablet Commonly known as:  DESYREL Take 1 tablet (50 mg total) by mouth at bedtime.  Indication:  Trouble Sleeping        Follow-up recommendations:  Activity:  as tolerated. Diet:  low sodium heart healthy. Other:  keep follow up appointments.  Comments:     Signed: Kristine LineaJolanta Haven Pylant, MD 05/09/2016, 6:38 AM

## 2016-05-09 NOTE — Progress Notes (Signed)
  [facility] :  Will you be returning to the same living situation after discharge:  No. Pt is discharging to  to live with his parents At discharge, do you have transportation home?: Yes,  pt will be picked up by his mother Do you have the ability to pay for your medications: Yes,  pt will be provided with prescriptions at discharge   Release of information consent forms completed and in the chart;  Patient's signature needed at discharge.  Patient to Follow up at: Follow-up Information    [facility] Follow up.        [facility] Follow up.   Why:  Please arrive to the walk-in clinic Monday-Ffriday between the hours of 8am-2:30pm for an assessment for medication management, substance abuse treatment and therapy.  Please call Unk PintoHarvey Bryant at [phone] for questions and assistance.  Contact information: [address] [place] [address] [phone]           Next level of care provider has [facility] to [facility] Link:no  Safety Planning and Suicide Prevention discussed: Yes,  completed with pt  Have you used any form of tobacco in the last 30 days? (Cigarettes, Smokeless Tobacco, Cigars, and/or Pipes): Yes  Has patient been referred to the Quitline?: Patient refused referral  Patient has been referred for addiction treatment: Yes  Dorothe PeaJonathan F Usman Millett 05/09/2016, 2:42 PM

## 2016-05-09 NOTE — Discharge Summary (Signed)
Physician Discharge Summary Note  Patient:  Charles Charles is an 55 y.o., male MRN:  [id number] DOB:  02/01/1961 Patient phone:  [phone] (home)  Patient address:   [address] [place] [id number],  Total Time spent with patient: 30 minutes  Date of Admission:  05/06/2016 Date of Discharge: 05/09/2016  Reason for Admission:  Suicidal and homicidal ideation, substance abuse.  Identifying data. Charles Charles is a 55 year old male with a history of schizophrenia and substance abuse.  Chief complaint. "I hear voices."  History of present illness. Information was obtained from the patient and the chart. The patient has a long history of schizophrenia. He responds well to Haldol but has not been compliant with oral preparation and consistently refuses injectable long-lasting Haldol decanoate. He came to the hospital complaining of auditory command hallucinations telling him to kill himself and his mother. He came to the emergency room asking for help. He reports some symptoms of depression with poor sleep, decreased appetite, anhedonia, feeling of guilt, social isolation, and heightened anxiety. She relapsed on cocaine and cannabis. He denies heavy alcohol intake or heavy drug use. Following his last hospitalization, the patient was discharged to a boarding house and was supposed to [facility] up [facility]. His mother was supposed to help him with transportation. The patient never started treatment as he "disappeared" from his apartment and could not be found. I do not believe that this patient take any medications at home and his major problem at present is substance abuse. He now appears to be interested in long term residential substance abuse treatment program participation. When we reviewed his option he made sure to let us know that he is unable to work because "the voices tell him not to".  Past psychiatric history. There is a long history of schizophrenia with multiple hospitalizations  including at [facility]. There is also history of substance use. The patient denies attempting suicide. He is unable to name any medications tried before but is certain that he does not want any injectables as he is afraid of needles.  Family psychiatric history. Nonreported.  Social history. He is disabled from mental illness. He lives in an apartment now. He had 7 children. One of them died on 10/26 years ago but It still makes him sad. His elderly mother is his only relatives and support but they have a very difficult relationship and argued frequently.   Principal Problem: Undifferentiated schizophrenia [facility](HCC) Discharge Diagnoses: Patient Active Problem List   Diagnosis Date Noted  . Noncompliance [Z91.19] 05/06/2016  . Cannabis use disorder, moderate, dependence (HCC) [F12.20] 05/06/2016  . Chest pain [R07.9] 04/14/2016  . Abdominal pain, epigastric [R10.13] 04/14/2016  . Undifferentiated schizophrenia (HCC) [F20.3] 04/10/2016  . Alcohol use disorder, moderate, dependence (HCC) [F10.20] 04/10/2016  . GERD (gastroesophageal reflux disease) [K21.9] 04/10/2016  . Tobacco use disorder [F17.200] 04/10/2016  . Suicidal ideation [R45.851] 04/09/2016  . Stimulant abuse [F15.10] 12/14/2014  . Cocaine use disorder, moderate, dependence (HCC) [F14.20] 12/17/2013  . Adult hypothyroidism [E03.9] 12/17/2013  . Blurred vision [H53.8] 12/17/2013   Past Medical History:  Past Medical History:  Diagnosis Date  . GERD (gastroesophageal reflux disease)   . Headache   . Schizo-affective schizophrenia [facility](HCC)     Past Surgical History:  Procedure Laterality Date  . COLONOSCOPY WITH PROPOFOL N/A 08/04/2015   Procedure: COLONOSCOPY WITH PROPOFOL;  Surgeon: Midge Miniumarren Wohl, MD;  [facility];  Service: Endoscopy;  Laterality: N/A;  . INCISION  AND DRAINAGE PERIRECTAL ABSCESS N/A 07/14/2015   Procedure: IRRIGATION AND DEBRIDEMENT PERIRECTAL ABSCESS;  Surgeon: Lattie Haw, MD;  Location: [facility] ORS;  Service: General;  Laterality: N/A;  . INCISION AND DRAINAGE PERIRECTAL ABSCESS N/A 08/14/2015   Procedure: IRRIGATION AND DEBRIDEMENT PERIRECTAL ABSCESS;  Surgeon: Gladis Riffle, MD;  Location: [facility] ORS;  Service: General;  Laterality: N/A;   Family History:  Family History  Problem Relation Age of Onset  . Anxiety disorder Mother   . Diabetes Mother   . Diabetes Father    Social History:  History  Alcohol Use  . 33.6 oz/week  . 56 Cans of beer per week    Comment: 2 x 40's daily - pt says no alcohol for several weeks     History  Drug Use No    Comment: back in the days    Social History   Social History  . Marital status: Single    Spouse name: N/A  . Number of children: N/A  . Years of education: N/A   Social History Main Topics  . Smoking status: Current Every Day Smoker    Packs/day: 0.10    Types: Cigarettes  . Smokeless tobacco: Never Used     Comment: (1-2 cigs/day)  . Alcohol use 33.6 oz/week    56 Cans of beer per week     Comment: 2 x 40's daily - pt says no alcohol for several weeks  . Drug use: No     Comment: back in the days  . Sexual activity: Not Asked   Other Topics Concern  . None   Social History Narrative  . None    Hospital Course:    Charles Charles is a 55 year old male with history of schizophrenia and substance use admitted for worsening of psychosis, suicidal or homicidal ideation, and substance use in the context of treatment noncompliance.  1. Suicidal and homicidal ideation. Resolved. The patient is able to contract for safety.   2. Psychosis. He was restarted again on Haldol and Cogentin for psychosis. He was given 50 mg of  Haldol decanoate injection on 05/07/2016 to improve compliance.  3. Insomnia. Trazodone was available.  4. GERD. He is on Pepcid.  5. Metabolic syndrome monitoring. Labs were performed during his recent hospitalization.   6. Substance abuse. The patient denies  heavy drinking but was positive for cocaine and cannabis. There are no symptoms of alcohol withdrawal.   7. Hypothyroidism. He is on Synthroid.  8. Substance abuse treatment. The patient was initially somewhat interested in residential, long term treatment but eventually changed his mind.  9. Perirectal abscess. I tried to obtain surgery consultation from [facility]. I spoke with 2 nurses but my call was not returned. We will make outpatient appointment with Dr. Excell Seltzer.   10. EKG. Sinus bradycardia. Otherwise normal EKG.   11. Disposition. He was discharged to home with his mother. He will follow up with SA IOP at [facility].  Physical Findings: AIMS:  , ,  ,  ,    CIWA:    COWS:     Musculoskeletal: Strength & Muscle Tone: within normal limits Gait & Station: normal Patient leans: N/A  Psychiatric Specialty Exam: Physical Exam  Nursing note and vitals reviewed.   Review of Systems  Gastrointestinal:       Draining perirectal abscess  Psychiatric/Behavioral: Positive for substance abuse.  All other systems reviewed and are negative.   Blood pressure 103/69, pulse (!) 57, temperature 98 F (  36.7 C), temperature source Oral, resp. rate 18, height 5\' 5"  (1.651 m), weight 67.6 kg (149 lb), SpO2 98 %.Body mass index is 24.79 kg/m.  General Appearance: Casual  Eye Contact:  Good  Speech:  Clear and Coherent  Volume:  Normal  Mood:  Anxious  Affect:  Appropriate  Thought Process:  Goal Directed and Descriptions of Associations: Intact  Orientation:  Full (Time, Place, and Person)  Thought Content:  WDL  Suicidal Thoughts:  No  Homicidal Thoughts:  No  Memory:  Immediate;   Fair Recent;   Fair Remote;   Fair  Judgement:  Poor  Insight:  Shallow  Psychomotor Activity:  Normal  Concentration:  Concentration: Fair and Attention Span: Fair  Recall:  [employer]Fair  Fund of Knowledge:  Fair  Language:  Fair  Akathisia:  No  Handed:  Right  AIMS (if indicated):      Assets:  Communication Skills Desire for Improvement Financial Resources/Insurance Physical Health Resilience Social Support  ADL's:  Intact  Cognition:  WNL  Sleep:  Number of Hours: 7.15     Have you used any form of tobacco in the last 30 days? (Cigarettes, Smokeless Tobacco, Cigars, and/or Pipes): Yes  Has this patient used any form of tobacco in the last 30 days? (Cigarettes, Smokeless Tobacco, Cigars, and/or Pipes) Yes, Yes, A prescription for an FDA-approved tobacco cessation medication was offered at discharge and the patient refused  Blood Alcohol level:  Lab Results  Component Value Date   ETH <5 05/05/2016   ETH 61 (H) 04/09/2016    Metabolic Disorder Labs:  Lab Results  Component Value Date   HGBA1C 5.5 04/11/2016   MPG 111 04/11/2016   No results found for: PROLACTIN Lab Results  Component Value Date   CHOL 162 04/11/2016   TRIG 75 04/11/2016   HDL 62 04/11/2016   CHOLHDL 2.6 04/11/2016   VLDL 15 04/11/2016   LDLCALC 85 04/11/2016   LDLCALC 76 01/27/2014    See Psychiatric Specialty Exam and Suicide Risk Assessment completed by Attending Physician prior to discharge.  Discharge destination:  Home  Is patient on multiple antipsychotic therapies at discharge:  No   Has Patient had three or more failed trials of antipsychotic monotherapy by history:  No  Recommended Plan for Multiple Antipsychotic Therapies: NA  Discharge Instructions    Diet - low sodium heart healthy    Complete by:  As directed    Increase activity slowly    Complete by:  As directed        Medication List    TAKE these medications     Indication  aspirin 81 MG EC tablet Take 1 tablet (81 mg total) by mouth daily.  Indication:  Heart Attack   benztropine 0.5 MG tablet Commonly known as:  COGENTIN Take 1 tablet (0.5 mg total) by mouth at bedtime.  Indication:  Extrapyramidal Reaction caused by Medications   famotidine 20 MG tablet Commonly known as:  PEPCID Take 1  tablet (20 mg total) by mouth 2 (two) times daily.  Indication:  Gastroesophageal Reflux Disease   haloperidol 5 MG tablet Commonly known as:  HALDOL Take 1 tablet (5 mg total) by mouth at bedtime.  Indication:  Schizophrenia   haloperidol decanoate 100 MG/ML injection Commonly known as:  HALDOL DECANOATE Inject 0.5 mLs (50 mg total) into the muscle every 30 (thirty) days. Start taking on:  06/07/2016  Indication:  Schizophrenia   ibuprofen 600 MG tablet Commonly known as:  ADVIL,MOTRIN Take 1 tablet (600 mg total) by mouth every 6 (six) hours as needed.  Indication:  Joint Damage causing Pain and Loss of Function   levothyroxine 50 MCG tablet Commonly known as:  SYNTHROID, LEVOTHROID Take 1 tablet (50 mcg total) by mouth daily before breakfast.  Indication:  Underactive Thyroid   Polyethylene Glycol Powd Take 17 g by mouth daily.  Indication:  constipation   traZODone 50 MG tablet Commonly known as:  DESYREL Take 1 tablet (50 mg total) by mouth at bedtime.  Indication:  Trouble Sleeping        Follow-up recommendations:  Activity:  as tolerated. Diet:  low sodium heart healthy. Other:  keep follow up appointments.  Comments:     Signed: Kristine Linea, MD 05/09/2016, 9:27 AM

## 2016-05-09 NOTE — Progress Notes (Signed)
D:Patient aware of discharge this shift . Patient returning home . Patient received all belonging locked up . Patient denies  Suicidal  And homicidal ideations  .  A: Writer instructed on discharge criteria  . Informed of Suicidal Risk Assessment ,  Transitional Record  And Discharge Summary  prescriptions  given topatientf . Aware  Of follow up appointment . R: Patient left unit with no questions  Or concerns  With  mother

## 2016-05-09 NOTE — Tx Team (Addendum)
Interdisciplinary Treatment and Diagnostic Plan Update  05/09/2016 Time of Session: 10:30am Charles Charles MRN: [id number]  Principal Diagnosis: Undifferentiated schizophrenia [facility](HCC)  Secondary Diagnoses: Principal Problem:   Undifferentiated schizophrenia (HCC) Active Problems:   Cocaine use disorder, moderate, dependence (HCC)   Adult hypothyroidism   Alcohol use disorder, moderate, dependence (HCC)   Tobacco use disorder   Cannabis use disorder, moderate, dependence (HCC)   Current Medications:  Current Facility-Administered Medications  Medication Dose Route Frequency Provider Last Rate Last Dose  . acetaminophen (TYLENOL) tablet 650 mg  650 mg Oral Q6H PRN Audery AmelJohn T Clapacs, MD   650 mg at 05/08/16 2109  . alum & mag hydroxide-simeth (MAALOX/MYLANTA) 200-200-20 MG/5ML suspension 30 mL  30 mL Oral Q4H PRN Audery AmelJohn T Clapacs, MD      . aspirin EC tablet 81 mg  81 mg Oral Daily Audery AmelJohn T Clapacs, MD   81 mg at 05/09/16 0836  . benztropine (COGENTIN) tablet 0.5 mg  0.5 mg Oral QHS Audery AmelJohn T Clapacs, MD   0.5 mg at 05/08/16 2108  . famotidine (PEPCID) tablet 20 mg  20 mg Oral Daily Audery AmelJohn T Clapacs, MD   20 mg at 05/09/16 0836  . haloperidol (HALDOL) tablet 5 mg  5 mg Oral QHS Audery AmelJohn T Clapacs, MD   5 mg at 05/08/16 2108  . haloperidol decanoate (HALDOL DECANOATE) 100 MG/ML injection 50 mg  50 mg Intramuscular Q30 days Shari ProwsJolanta B [name], MD   50 mg at 05/08/16 1227  . hydrOXYzine (ATARAX/VISTARIL) tablet 25 mg  25 mg Oral TID PRN Audery AmelJohn T Clapacs, MD      . levothyroxine (SYNTHROID, LEVOTHROID) tablet 50 mcg  50 mcg Oral QAC breakfast Audery AmelJohn T Clapacs, MD   50 mcg at 05/09/16 0606  . magnesium hydroxide (MILK OF MAGNESIA) suspension 30 mL  30 mL Oral Daily PRN Audery AmelJohn T Clapacs, MD      . traZODone (DESYREL) tablet 50 mg  50 mg Oral QHS PRN Audery AmelJohn T Clapacs, MD   50 mg at 05/06/16 2124   PTA Medications: Prescriptions Prior to Admission  Medication Sig Dispense Refill Last Dose  . ibuprofen (ADVIL,MOTRIN)  600 MG tablet Take 1 tablet (600 mg total) by mouth every 6 (six) hours as needed. 30 tablet 0   . Polyethylene Glycol POWD Take 17 g by mouth daily. 1000 g 0   . [DISCONTINUED] aspirin EC 81 MG EC tablet Take 1 tablet (81 mg total) by mouth daily. 30 tablet 1   . [DISCONTINUED] benztropine (COGENTIN) 0.5 MG tablet Take 1 tablet (0.5 mg total) by mouth at bedtime. 30 tablet 1   . [DISCONTINUED] famotidine (PEPCID) 20 MG tablet Take 1 tablet (20 mg total) by mouth 2 (two) times daily. 60 tablet 1   . [DISCONTINUED] haloperidol (HALDOL) 5 MG tablet Take 1 tablet (5 mg total) by mouth at bedtime. 30 tablet 1   . [DISCONTINUED] levothyroxine (SYNTHROID, LEVOTHROID) 50 MCG tablet Take 1 tablet (50 mcg total) by mouth daily before breakfast. 30 tablet 1   . [DISCONTINUED] traZODone (DESYREL) 50 MG tablet Take 1 tablet (50 mg total) by mouth at bedtime. 30 tablet 1     Patient Stressors: Marital or family conflict Medication change or noncompliance Substance abuse  Patient Strengths: Active sense of humor Average or above average intelligence Communication skills  Treatment Modalities: Medication Management, Group therapy, Case management,  1 to 1 session with clinician, Psychoeducation, Recreational therapy.   Physician Treatment Plan for Primary Diagnosis: Undifferentiated schizophrenia (  HCC) Long Term Goal(s): Improvement in symptoms so as ready for discharge Improvement in symptoms so as ready for discharge   Short Term Goals: Ability to identify changes in lifestyle to reduce recurrence of condition will improve Ability to verbalize feelings will improve Ability to disclose and discuss suicidal ideas Ability to demonstrate self-control will improve Ability to identify and develop effective coping behaviors will improve Ability to maintain clinical measurements within normal limits will improve Compliance with prescribed medications will improve Ability to identify changes in lifestyle  to reduce recurrence of condition will improve Ability to demonstrate self-control will improve Ability to identify triggers associated with substance abuse/mental health issues will improve  Medication Management: Evaluate patient's response, side effects, and tolerance of medication regimen.  Therapeutic Interventions: 1 to 1 sessions, Unit Group sessions and Medication administration.  Evaluation of Outcomes: Adequate for discharge  Physician Treatment Plan for Secondary Diagnosis: Principal Problem:   Undifferentiated schizophrenia (HCC) Active Problems:   Cocaine use disorder, moderate, dependence (HCC)   Adult hypothyroidism   Alcohol use disorder, moderate, dependence (HCC)   Tobacco use disorder   Cannabis use disorder, moderate, dependence (HCC)  Long Term Goal(s): Improvement in symptoms so as ready for discharge Improvement in symptoms so as ready for discharge   Short Term Goals: Ability to identify changes in lifestyle to reduce recurrence of condition will improve Ability to verbalize feelings will improve Ability to disclose and discuss suicidal ideas Ability to demonstrate self-control will improve Ability to identify and develop effective coping behaviors will improve Ability to maintain clinical measurements within normal limits will improve Compliance with prescribed medications will improve Ability to identify changes in lifestyle to reduce recurrence of condition will improve Ability to demonstrate self-control will improve Ability to identify triggers associated with substance abuse/mental health issues will improve     Medication Management: Evaluate patient's response, side effects, and tolerance of medication regimen.  Therapeutic Interventions: 1 to 1 sessions, Unit Group sessions and Medication administration.  Evaluation of Outcomes: Adequate for discharge   RN Treatment Plan for Primary Diagnosis: Undifferentiated schizophrenia (HCC) Long Term  Goal(s): Knowledge of disease and therapeutic regimen to maintain health will improve  Short Term Goals: Ability to remain free from injury will improve, Ability to participate in decision making will improve, Ability to identify and develop effective coping behaviors will improve and Compliance with prescribed medications will improve  Medication Management: RN will administer medications as ordered by provider, will assess and evaluate patient's response and provide education to patient for prescribed medication. RN will report any adverse and/or side effects to prescribing provider.  Therapeutic Interventions: 1 on 1 counseling sessions, Psychoeducation, Medication administration, Evaluate responses to treatment, Monitor vital signs and CBGs as ordered, Perform/monitor CIWA, COWS, AIMS and Fall Risk screenings as ordered, Perform wound care treatments as ordered.  Evaluation of Outcomes: Adequate for discharge   LCSW Treatment Plan for Primary Diagnosis: Undifferentiated schizophrenia (HCC) Long Term Goal(s): Safe transition to appropriate next level of care at discharge, Engage patient in therapeutic group addressing interpersonal concerns.  Short Term Goals: Engage patient in aftercare planning with referrals and resources, Increase social support, Increase ability to appropriately verbalize feelings, Increase emotional regulation, Facilitate acceptance of mental health diagnosis and concerns and Increase skills for wellness and recovery  Therapeutic Interventions: Assess for all discharge needs, 1 to 1 time with Social worker, Explore available resources and support systems, Assess for adequacy in community support network, Educate family and significant other(s) on suicide prevention, Complete  Psychosocial Assessment, Interpersonal group therapy.  Evaluation of Outcomes: Adequate for discharge   Progress in Treatment: Attending groups: Yes Participating in groups: Yes Taking medication  as prescribed: Yes. Toleration medication: Yes. Family/Significant other contact made: Yes, CSW contacted pt's mother at ph: [phone] Patient understands diagnosis: Yes. Discussing patient identified problems/goals with staff: Yes Medical problems stabilized or resolved: Yes. Denies suicidal/homicidal ideation: Yes. Issues/concerns per patient self-inventory: Yes. Other: none reported  New problem(s) identified: No, Describe:  none reported  New Short Term/Long Term Goal(s):  Discharge Plan or Barriers:  [place] with his mother and will follow up with [facility] for medication management, substance abuse treatment and therapy.    Reason for Continuation of Hospitalization: Adequate for discharge per MD on 05/09/16  Anxiety Depression Hallucinations Suicidal ideation Withdrawal symptoms  Estimated Date of discharge: 05/09/16   Attendees: Patient: Charles Charles 05/09/2016 10:51 AM  Physician: Dr. Jennet MaduroPucilowska, MD 05/09/2016 10:51 AM  Nursing: Elenore PaddyJennifer Morrow, RN 05/09/2016 10:51 AM  RN Care Manager:  05/09/2016 10:51 AM  Social Worker: Dorothe PeaJonathan F. Roylene ReasonRiffey, LCSWA, LCAS 05/09/2016 10:51 AM  Recreational Therapist: Hershal CoriaBeth Greene, LRT 05/09/2016 10:51 AM  Other:  05/09/2016 10:51 AM  Other:  05/09/2016 10:51 AM  Other: 05/09/2016 10:51 AM    Scribe for Treatment Team: Dorothe PeaJonathan F Annelise Mccoy, LCSWA 05/09/2016

## 2016-05-10 ENCOUNTER — Other Ambulatory Visit: Payer: Self-pay

## 2016-05-14 ENCOUNTER — Ambulatory Visit: Payer: Self-pay | Admitting: Surgery

## 2016-05-16 ENCOUNTER — Ambulatory Visit (INDEPENDENT_AMBULATORY_CARE_PROVIDER_SITE_OTHER): Payer: Medicaid Other | Admitting: Surgery

## 2016-05-16 ENCOUNTER — Encounter: Payer: Self-pay | Admitting: Surgery

## 2016-05-16 VITALS — BP 106/75 | HR 67 | Temp 98.0°F | Wt 158.0 lb

## 2016-05-16 DIAGNOSIS — F1420 Cocaine dependence, uncomplicated: Secondary | ICD-10-CM

## 2016-05-16 DIAGNOSIS — K611 Rectal abscess: Secondary | ICD-10-CM

## 2016-05-16 DIAGNOSIS — F1020 Alcohol dependence, uncomplicated: Secondary | ICD-10-CM

## 2016-05-16 NOTE — Progress Notes (Signed)
Subjective:     Patient ID: Charles Charles, male   DOB: 12/22/1960, 55 y.o.   MRN: [id number]  HPI  55 year old male who is well-known to the surgery service with previous anal fistula and perianal abscesses. Patient is still having some anal area and states that he feels as if his never healed. Patient also has some bleeding and some drainage from the area that is continuous. Patient states that he hasn't had any fevers or chills and that he is not having increased pain in the area however is not seen to improve. He did briefly moved to [place] but now is staying with his mother. He has had some recent hospitalizations and emergency room admissions due to homicidal and suicidal ideations. He does not currently have any of these urges for homicidal or suicidal ideation at this time. Patient has been drinking plenty of water and has been having bowel movements whenever he is in not been going on binges from an out alcohol and cocaine standpoint. Patient does use crack and he is attempting to get his life in order and is attending support groups. His mother is here with him who is helping him as well.  Past Medical History:  Diagnosis Date  . GERD (gastroesophageal reflux disease)   . Headache   . Schizo-affective schizophrenia [facility](HCC)    Past Surgical History:  Procedure Laterality Date  . COLONOSCOPY WITH PROPOFOL N/A 08/04/2015   Procedure: COLONOSCOPY WITH PROPOFOL;  Surgeon: Midge Miniumarren Wohl, MD;  Location: [facility];  Service: Endoscopy;  Laterality: N/A;  . INCISION AND DRAINAGE PERIRECTAL ABSCESS N/A 07/14/2015   Procedure: IRRIGATION AND DEBRIDEMENT PERIRECTAL ABSCESS;  Surgeon: Lattie Hawichard E Cooper, MD;  Location: [facility];  Service: General;  Laterality: N/A;  . INCISION AND DRAINAGE PERIRECTAL ABSCESS N/A 08/14/2015   Procedure: IRRIGATION AND DEBRIDEMENT PERIRECTAL ABSCESS;  Surgeon: Gladis Riffleatherine L Tashay Bozich, MD;  Location: [facility];  Service: General;  Laterality: N/A;   Family History   Problem Relation Age of Onset  . Anxiety disorder Mother   . Diabetes Mother   . Diabetes Father    Social History   Social History  . Marital status: Single    Spouse name: N/A  . Number of children: N/A  . Years of education: N/A   Social History Main Topics  . Smoking status: Current Every Day Smoker    Packs/day: 0.10    Types: Cigarettes  . Smokeless tobacco: Never Used     Comment: (1-2 cigs/day)  . Alcohol use 33.6 oz/week    56 Cans of beer per week     Comment: 2 x 40's daily - pt says no alcohol for several weeks  . Drug use: No     Comment: back in the days  . Sexual activity: Not Asked   Other Topics Concern  . None   Social History Narrative  . None    Current Outpatient Prescriptions:  .  aspirin 81 MG EC tablet, Take 1 tablet (81 mg total) by mouth daily., Disp: 30 tablet, Rfl: 1 .  benztropine (COGENTIN) 1 MG tablet, Take 1 mg by mouth., Disp: , Rfl:  .  famotidine (PEPCID) 20 MG tablet, Take 1 tablet (20 mg total) by mouth 2 (two) times daily., Disp: 60 tablet, Rfl: 1 .  haloperidol (HALDOL) 5 MG tablet, Take 1 tablet (5 mg total) by mouth at bedtime., Disp: 30 tablet, Rfl: 1 .  ibuprofen (ADVIL,MOTRIN) 600 MG tablet, Take 1 tablet (600 mg total) by mouth  every 6 (six) hours as needed., Disp: 30 tablet, Rfl: 0 .  levothyroxine (SYNTHROID, LEVOTHROID) 50 MCG tablet, Take 1 tablet (50 mcg total) by mouth daily before breakfast., Disp: 30 tablet, Rfl: 1 .  naproxen (NAPROSYN) 500 MG tablet, Take 500 mg by mouth., Disp: , Rfl:  .  traZODone (DESYREL) 50 MG tablet, Take 1 tablet (50 mg total) by mouth at bedtime., Disp: 30 tablet, Rfl: 1 Allergies  Allergen Reactions  . Penicillins Anaphylaxis, Hives and Other (See Comments)    Has patient had a PCN reaction causing immediate rash, facial/tongue/throat swelling, SOB or lightheadedness with hypotension: Yes Has patient had a PCN reaction causing severe rash involving mucus membranes or skin necrosis: No Has  patient had a PCN reaction that required hospitalization No Has patient had a PCN reaction occurring within the last 10 years: Yes If all of the above answers are "NO", then may proceed with Cephalosporin use. Other reaction(s): UNKNOWN Has patient had a PCN reaction causing immediate rash, facial/tongue/throat swelling, SOB or lightheadedness with hypotension: Yes Has patient had a PCN reaction causing severe rash involving mucus membranes or skin necrosis: No Has patient had a PCN reaction that required hospitalization No Has patient had a PCN reaction occurring within the last 10 years: Yes If all of the above answers are "NO", then may proceed with Cephalosporin use.    Review of Systems  Constitutional: Negative for activity change, appetite change, chills, fatigue, fever and unexpected weight change.  HENT: Negative for congestion and sore throat.   Respiratory: Negative for apnea, cough, choking, chest tightness, shortness of breath, wheezing and stridor.   Cardiovascular: Negative for chest pain, palpitations and leg swelling.  Gastrointestinal: Positive for anal bleeding and rectal pain. Negative for abdominal distention, abdominal pain, blood in stool, constipation, diarrhea, nausea and vomiting.  Genitourinary: Negative for difficulty urinating and hematuria.  Musculoskeletal: Negative for back pain and neck pain.  Skin: Negative for color change, pallor, rash and wound.  All other systems reviewed and are negative.      Vitals:   05/16/16 1113  BP: 106/75  Pulse: 67  Temp: 98 F (36.7 C)    Objective:   Physical Exam  Constitutional: He is oriented to person, place, and time. He appears well-developed and well-nourished. No distress.  HENT:  Head: Normocephalic and atraumatic.  Right Ear: External ear normal.  Left Ear: External ear normal.  Nose: Nose normal.  Mouth/Throat: Oropharynx is clear and moist. No oropharyngeal exudate.  Eyes: Conjunctivae and EOM are  normal. Pupils are equal, round, and reactive to light. No scleral icterus.  Neck: Normal range of motion. Neck supple. No tracheal deviation present.  Cardiovascular: Normal rate, regular rhythm and normal heart sounds.  Exam reveals no friction rub.   No murmur heard. Pulmonary/Chest: Effort normal and breath sounds normal. No respiratory distress. He has no wheezes. He has no rales.  Abdominal: Soft. Bowel sounds are normal. He exhibits no distension. There is no tenderness. There is no rebound.  Genitourinary:  Genitourinary Comments: Rectum with left-sided posterior area of previous incision that is not healed it does still have some minimal drainage is not red and there is no induration surrounding however it still is tender. Also on left buttock has a sebaceous cyst that is approximately 2 cm in size with a punctum however no erythema and no current drainage.  Musculoskeletal: Normal range of motion. He exhibits no edema or tenderness.  Neurological: He is alert and oriented to person,  place, and time. No cranial nerve deficit.  Skin: Skin is warm and dry. No rash noted. No erythema. No pallor.  Psychiatric: He has a normal mood and affect. His behavior is normal. Judgment normal.  Vitals reviewed.      CBC Latest Ref Rng & Units 05/05/2016 04/09/2016 03/20/2016  WBC 3.8 - 10.6 K/uL 7.0 9.2 6.0  Hemoglobin 13.0 - 18.0 g/dL 40.913.8 81.115.3 91.414.9  Hematocrit 40.0 - 52.0 % 38.6(L) 44.5 41.8  Platelets 150 - 440 K/uL 334 342 347   CMP Latest Ref Rng & Units 05/05/2016 04/09/2016 03/20/2016  Glucose 65 - 99 mg/dL 782(N113(H) 98 562(Z117(H)  BUN 6 - 20 mg/dL 15 12 15   Creatinine 0.61 - 1.24 mg/dL 3.080.87 6.570.99 8.461.04  Sodium 135 - 145 mmol/L 139 138 139  Potassium 3.5 - 5.1 mmol/L 3.8 3.8 3.9  Chloride 101 - 111 mmol/L 109 104 106  CO2 22 - 32 mmol/L 25 22 27   Calcium 8.9 - 10.3 mg/dL 9.6(E8.8(L) 9.7 9.3  Total Protein 6.5 - 8.1 g/dL 7.0 7.9 7.0  Total Bilirubin 0.3 - 1.2 mg/dL 0.3 0.3 0.5  Alkaline Phos 38 - 126  U/L 71 62 68  AST 15 - 41 U/L 20 23 25   ALT 17 - 63 U/L 14(L) 16(L) 21    Assessment:     55 year old male with substance abuse issues and a nonhealing perirectal abscess    Plan:     I have personally reviewed his past medical history which includes recent hospitalizations and emergency department visits due to his crack and alcohol use. Patient is getting help in this area and now living with mother and attempting to turn his life around without using. I also personally reviewed his laboratory values were didn't have a high white blood cell count and otherwise within normal limits.  I personally discussed with both the patient and his mother that he is likely not healing this area for a couple of reasons, #1 because he is not drinking enough water and still having some constipation and #2 because with his binges he is not having adequate nutrition. I discussed with him that the longer he can stay clean and sober obviously the better. I have discussed with him drinking plenty of water and getting in a high protein diet. I don't believe that either of these things will happen without him staying clean from crack and alcohol use. I will have him follow-up with me in 2 weeks to ensure continuation with his plan for substance abuse and see if there is any improvement in the wound.  Time spent with the patient was 35 minutes, with more than 50% of the time spent in face-to-face education, counseling and care coordination.

## 2016-05-16 NOTE — Patient Instructions (Signed)
Please give us a call if you have any questions or concerns. 

## 2016-06-02 ENCOUNTER — Emergency Department
Admission: EM | Admit: 2016-06-02 | Discharge: 2016-06-04 | Disposition: A | Discharge status: Other Acute Inpatient Hospital | Payer: Medicaid Other | Attending: Emergency Medicine | Admitting: Emergency Medicine

## 2016-06-02 ENCOUNTER — Encounter: Payer: Self-pay | Admitting: Emergency Medicine

## 2016-06-02 DIAGNOSIS — F17210 Nicotine dependence, cigarettes, uncomplicated: Secondary | ICD-10-CM | POA: Diagnosis not present

## 2016-06-02 DIAGNOSIS — F10120 Alcohol abuse with intoxication, uncomplicated: Secondary | ICD-10-CM | POA: Diagnosis not present

## 2016-06-02 DIAGNOSIS — F203 Undifferentiated schizophrenia: Secondary | ICD-10-CM | POA: Diagnosis present

## 2016-06-02 DIAGNOSIS — F1410 Cocaine abuse, uncomplicated: Secondary | ICD-10-CM | POA: Insufficient documentation

## 2016-06-02 DIAGNOSIS — F1210 Cannabis abuse, uncomplicated: Secondary | ICD-10-CM | POA: Insufficient documentation

## 2016-06-02 DIAGNOSIS — E039 Hypothyroidism, unspecified: Secondary | ICD-10-CM | POA: Insufficient documentation

## 2016-06-02 DIAGNOSIS — Z5181 Encounter for therapeutic drug level monitoring: Secondary | ICD-10-CM | POA: Diagnosis not present

## 2016-06-02 DIAGNOSIS — R45851 Suicidal ideations: Secondary | ICD-10-CM | POA: Diagnosis present

## 2016-06-02 DIAGNOSIS — F1220 Cannabis dependence, uncomplicated: Secondary | ICD-10-CM | POA: Diagnosis present

## 2016-06-02 DIAGNOSIS — F331 Major depressive disorder, recurrent, moderate: Secondary | ICD-10-CM | POA: Diagnosis not present

## 2016-06-02 DIAGNOSIS — F1010 Alcohol abuse, uncomplicated: Secondary | ICD-10-CM

## 2016-06-02 DIAGNOSIS — F1020 Alcohol dependence, uncomplicated: Secondary | ICD-10-CM | POA: Diagnosis present

## 2016-06-02 DIAGNOSIS — F10920 Alcohol use, unspecified with intoxication, uncomplicated: Secondary | ICD-10-CM

## 2016-06-02 DIAGNOSIS — F1420 Cocaine dependence, uncomplicated: Secondary | ICD-10-CM | POA: Diagnosis present

## 2016-06-02 LAB — COMPREHENSIVE METABOLIC PANEL
ALBUMIN: 3.7 g/dL (ref 3.5–5.0)
ALT: 19 U/L (ref 17–63)
AST: 25 U/L (ref 15–41)
Alkaline Phosphatase: 75 U/L (ref 38–126)
Anion gap: 12 (ref 5–15)
BUN: 12 mg/dL (ref 6–20)
CHLORIDE: 105 mmol/L (ref 101–111)
CO2: 25 mmol/L (ref 22–32)
CREATININE: 1.38 mg/dL — AB (ref 0.61–1.24)
Calcium: 9.2 mg/dL (ref 8.9–10.3)
GFR calc Af Amer: >60 mL/min (ref >60)
GFR, EST NON AFRICAN AMERICAN: 56 mL/min — AB (ref >60)
GLUCOSE: 117 mg/dL — AB (ref 65–99)
POTASSIUM: 3.4 mmol/L — AB (ref 3.5–5.1)
Sodium: 142 mmol/L (ref 135–145)
Total Protein: 7.6 g/dL (ref 6.5–8.1)

## 2016-06-02 LAB — URINE DRUG SCREEN, QUALITATIVE (ARMC ONLY)
Amphetamines, Ur Screen: NOT DETECTED
BARBITURATES, UR SCREEN: NOT DETECTED
BENZODIAZEPINE, UR SCRN: NOT DETECTED
CANNABINOID 50 NG, UR ~~LOC~~: POSITIVE — AB
COCAINE METABOLITE, UR ~~LOC~~: POSITIVE — AB
MDMA (Ecstasy)Ur Screen: NOT DETECTED
METHADONE SCREEN, URINE: NOT DETECTED
OPIATE, UR SCREEN: NOT DETECTED
PHENCYCLIDINE (PCP) UR S: NOT DETECTED
Tricyclic, Ur Screen: NOT DETECTED

## 2016-06-02 LAB — CBC
HEMATOCRIT: 39.9 % — AB (ref 40.0–52.0)
HEMOGLOBIN: 14.2 g/dL (ref 13.0–18.0)
MCH: 32.7 pg (ref 26.0–34.0)
MCHC: 35.7 g/dL (ref 32.0–36.0)
MCV: 91.5 fL (ref 80.0–100.0)
Platelets: 309 10*3/uL (ref 150–440)
RBC: 4.36 MIL/uL — ABNORMAL LOW (ref 4.40–5.90)
RDW: 13.4 % (ref 11.5–14.5)
WBC: 10.2 10*3/uL (ref 3.8–10.6)

## 2016-06-02 LAB — ETHANOL: ALCOHOL ETHYL (B): 202 mg/dL — AB (ref <5)

## 2016-06-02 MED ORDER — VITAMIN B-1 100 MG PO TABS
100.0000 mg | ORAL_TABLET | Freq: Every day | ORAL | Status: DC
  Administered 2016-06-03: 100 mg via ORAL
  Filled 2016-06-02: qty 1

## 2016-06-02 MED ORDER — LORAZEPAM 2 MG PO TABS: 0.0000 mg | ORAL_TABLET | Freq: Four times a day (QID) | ORAL | Status: DC

## 2016-06-02 MED ORDER — THIAMINE HCL 100 MG/ML IJ SOLN
Freq: Once | INTRAVENOUS | Status: AC
  Administered 2016-06-03: 01:00:00 via INTRAVENOUS
  Filled 2016-06-02: qty 1000
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
## 2016-06-02 NOTE — ED Notes (Signed)
Pt come in to triage with cigarettes, two lighters, a pencil, and Haldol vial for injection which pt states he is supposed to have done once a month.
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
## 2016-06-02 NOTE — ED Notes (Signed)
Pt arrived voluntarily with [employer] officer [place]herty with c/o auditory hallucinations; says he's starting using drugs again as well; history of schizophrenia; pt calm and cooperative;

## 2016-06-03 DIAGNOSIS — F203 Undifferentiated schizophrenia: Secondary | ICD-10-CM | POA: Diagnosis not present

## 2016-06-03 MED ORDER — HALOPERIDOL DECANOATE 100 MG/ML IM SOLN
100.0000 mg | Freq: Once | INTRAMUSCULAR | Status: DC
  Filled 2016-06-03: qty 1

## 2016-06-03 MED ORDER — FAMOTIDINE 20 MG PO TABS
20.0000 mg | ORAL_TABLET | Freq: Two times a day (BID) | ORAL | Status: DC
  Filled 2016-06-03: qty 1

## 2016-06-03 MED ORDER — LEVOTHYROXINE SODIUM 25 MCG PO TABS: 50.0000 ug | ORAL_TABLET | Freq: Every day | ORAL | Status: DC

## 2016-06-03 MED ORDER — TRAZODONE HCL 100 MG PO TABS: 100.0000 mg | ORAL_TABLET | Freq: Every day | ORAL | Status: DC

## 2016-06-03 MED ORDER — ASPIRIN EC 81 MG PO TBEC
81.0000 mg | DELAYED_RELEASE_TABLET | Freq: Every day | ORAL | Status: DC
  Filled 2016-06-03: qty 1

## 2016-06-03 MED ORDER — HALOPERIDOL 5 MG PO TABS: 5.0000 mg | ORAL_TABLET | Freq: Every day | ORAL | Status: DC

## 2016-06-03 MED ORDER — BENZTROPINE MESYLATE 1 MG PO TABS: 1.0000 mg | ORAL_TABLET | Freq: Two times a day (BID) | ORAL | Status: DC | PRN

## 2016-06-03 NOTE — BH Assessment (Signed)
Assessment Note  Charles Charles is an 55 y.o. male. [name] arrived to the ED by way of the police department.  He reports that he is hearing voices.  He states that the voices told him to hurt himself and to hurt others.  He reports symptoms of depression and excessive worrying.  He denied the symptoms of anxiety.  He denied visual hallucinations.  He denied suicidal or homicidal desires.  He denied current stressors.  He reports using alcohol. He denied the use of drugs.  BAL is 202.  Though he denied the use of drugs, his UDS resulted in positive results for cocaine and marijuana.  Diagnosis: schizo-affective disorder  Past Medical History:  Past Medical History:  Diagnosis Date  . GERD (gastroesophageal reflux disease)   . Headache   . Schizo-affective schizophrenia [facility])     Past Surgical History:  Procedure Laterality Date  . COLONOSCOPY WITH PROPOFOL N/A 08/04/2015   Procedure: COLONOSCOPY WITH PROPOFOL;  Surgeon: Midge Minium, MD;  Location: [facility];  Service: Endoscopy;  Laterality: N/A;  . INCISION AND DRAINAGE PERIRECTAL ABSCESS N/A 07/14/2015   Procedure: IRRIGATION AND DEBRIDEMENT PERIRECTAL ABSCESS;  Surgeon: Lattie Haw, MD;  Location: [facility];  Service: General;  Laterality: N/A;  . INCISION AND DRAINAGE PERIRECTAL ABSCESS N/A 08/14/2015   Procedure: IRRIGATION AND DEBRIDEMENT PERIRECTAL ABSCESS;  Surgeon: Gladis Riffle, MD;  Location: [facility];  Service: General;  Laterality: N/A;    Family History:  Family History  Problem Relation Age of Onset  . Anxiety disorder Mother   . Diabetes Mother   . Diabetes Father     Social History:  reports that he has been smoking Cigarettes.  He has been smoking about 0.10 packs per day. He has never used smokeless tobacco. He reports that he drinks about 33.6 oz of alcohol per week . He reports that he uses drugs, including Cocaine and Marijuana.  Additional Social History:  Alcohol / Drug Use History of alcohol  / drug use?: Yes Substance #1 Name of Substance 1: Alcohol 1 - Age of First Use: 8 1 - Amount (size/oz): 2 beers (Cans) 1 - Frequency: daily 1 - Last Use / Amount: 06/02/2016  CIWA: CIWA-Ar BP: 101/65 Pulse Rate: 89 Nausea and Vomiting: no nausea and no vomiting Tactile Disturbances: none Tremor: no tremor Auditory Disturbances: not present Paroxysmal Sweats: no sweat visible Visual Disturbances: not present Anxiety: mildly anxious Headache, Fullness in Head: none present Agitation: normal activity Orientation and Clouding of Sensorium: disoriented for data by no more than 2 calendar days CIWA-Ar Total: 3 COWS:    Allergies:  Allergies  Allergen Reactions  . Penicillins Anaphylaxis, Hives and Other (See Comments)    Has patient had a PCN reaction causing immediate rash, facial/tongue/throat swelling, SOB or lightheadedness with hypotension: Yes Has patient had a PCN reaction causing severe rash involving mucus membranes or skin necrosis: No Has patient had a PCN reaction that required hospitalization No Has patient had a PCN reaction occurring within the last 10 years: Yes If all of the above answers are "NO", then may proceed with Cephalosporin use. Other reaction(s): UNKNOWN Has patient had a PCN reaction causing immediate rash, facial/tongue/throat swelling, SOB or lightheadedness with hypotension: Yes Has patient had a PCN reaction causing severe rash involving mucus membranes or skin necrosis: No Has patient had a PCN reaction that required hospitalization No Has patient had a PCN reaction occurring within the last 10 years: Yes If all of the  above answers are "NO", then may proceed with Cephalosporin use.    Home Medications:  (Not in a hospital admission)  OB/GYN Status:  No LMP for male patient.  General Assessment Data [facility] Assessment: In system Is this a Tele or Face-to-Face Assessment?: Face-to-Face Is this an Initial  Assessment or a Re-assessment for this encounter?: Initial Assessment Marital status: Single Maiden name: n/a Is patient pregnant?: No Pregnancy Status: No Living Arrangements: Alone Can pt return to current living arrangement?: Yes Admission Status: Involuntary Is patient capable of signing voluntary admission?: Yes Referral Source: Self/Family/Friend Insurance type: Medicaid  Medical Screening Exam [facility] Walk-in ONLY) Medical Exam completed: Yes  Crisis Care Plan Living Arrangements: Alone Legal Guardian: Other: (Self) Name of Psychiatrist: [facility] Name of Therapist: [facility]  Education Status Is patient currently in school?: No Current Grade: n/a Highest grade of school patient has completed: 9th Name of school: [employer] Contact person: n/a  Risk to self with the past 6 months Suicidal Ideation: No Has patient been a risk to self within the past 6 months prior to admission? : No Suicidal Intent: No Has patient had any suicidal intent within the past 6 months prior to admission? : No Is patient at risk for suicide?: No Suicidal Plan?: No Has patient had any suicidal plan within the past 6 months prior to admission? : No Access to Means: No Specify Access to Suicidal Means: denied suicidal desires What has been your use of drugs/alcohol within the last 12 months?: reports use of alcohol (positive for cocaine and marijuana, BAL = 202) Previous Attempts/Gestures: Yes How many times?: 2 Other Self Harm Risks: denied Triggers for Past Attempts: None known Intentional Self Injurious Behavior: None (denied by patient) Comment - Self Injurious Behavior: denied by patient Family Suicide History: No Recent stressful life event(s): Other (Comment) (housing) Persecutory voices/beliefs?: Yes Depression: Yes Depression Symptoms: Feeling worthless/self pity Substance abuse history and/or treatment for substance abuse?: Yes Suicide prevention information given to non-admitted patients:  Not applicable  Risk to Others within the past 6 months Homicidal Ideation: No Does patient have any lifetime risk of violence toward others beyond the six months prior to admission? : No Thoughts of Harm to Others: No Current Homicidal Intent: No Current Homicidal Plan: No Access to Homicidal Means: No Identified Victim: None reported History of harm to others?: No Assessment of Violence: None Noted Violent Behavior Description: denied Does patient have access to weapons?: No Criminal Charges Pending?: Yes Describe Pending Criminal Charges: Misdomeanor drug paraphernalia, misdomeanor larceny Does patient have a court date: Yes Court Date: 06/10/16 Is patient on probation?: No  Psychosis Hallucinations: Auditory Delusions: None noted  Mental Status Report Appearance/Hygiene: In scrubs Eye Contact: Poor Motor Activity: Unremarkable Speech: Logical/coherent Level of Consciousness: Alert Mood: Irritable Affect: Irritable Anxiety Level: None Judgement: Partial Orientation: Person, Place, Situation Obsessive Compulsive Thoughts/Behaviors: None  Cognitive Functioning Concentration: Fair Memory: Recent Intact IQ: Average Insight: Poor Impulse Control: Poor Appetite: Good Sleep: No Change Vegetative Symptoms: None  ADLScreening [facility] Assessment Services) Patient's cognitive ability adequate to safely complete daily activities?: Yes Patient able to express need for assistance with ADLs?: Yes Independently performs ADLs?: Yes (appropriate for developmental age)  Prior Inpatient Therapy Prior Inpatient Therapy: Yes Prior Therapy Dates: 2017 [facility] Reason for Treatment: Schizophrenia  Prior Outpatient Therapy Prior Outpatient Therapy: Yes Prior Therapy Dates: current Prior Therapy Facilty/Provider(s): [facility] Reason for Treatment: Schizophrenia Does patient have an ACCT team?: No Does patient  have Intensive In-House Services?  : No Does  patient have [facility] services? : No Does patient have P4CC services?: No  ADL Screening (condition at time of admission) Patient's cognitive ability adequate to safely complete daily activities?: Yes Patient able to express need for assistance with ADLs?: Yes Independently performs ADLs?: Yes (appropriate for developmental age)       Abuse/Neglect Assessment (Assessment to be complete while patient is alone) Physical Abuse: Denies Verbal Abuse: Denies Sexual Abuse: Denies Exploitation of patient/patient's resources: Denies Self-Neglect: Denies     Merchant navy officerAdvance Directives (For Healthcare) Does Patient Have a Medical Advance Directive?: No Would patient like information on creating a medical advance directive?: No - Patient declined    Additional Information 1:1 In Past 12 Months?: No CIRT Risk: No Elopement Risk: No Does patient have medical clearance?: Yes     Disposition:  Disposition Initial Assessment Completed for this Encounter: Yes Disposition of Patient: Other dispositions  On Site Evaluation by:   Reviewed with Physician:    Justice DeedsKeisha Simrit Gohlke 06/03/2016 12:55 AM

## 2016-06-03 NOTE — ED Notes (Signed)
Pt cooperative. Not forth coming with much information to this Clinical research associatewriter. Pt stated he is here because he hears voices telling him to hurt himself and others. Pt denies SI/HI.  Pt given soft drink. Maintained on 15 minute checks and observation by security camera for safety.

## 2016-06-03 NOTE — ED Notes (Signed)
Patient asleep in room. No noted distress or abnormal behavior. Will continue 15 minute checks and observation by security cameras for safety. 

## 2016-06-03 NOTE — ED Notes (Signed)

## 2016-06-03 NOTE — ED Notes (Signed)
Report was received from April B., RN; Pt. Verbalizes no complaints or distress;  Verbalizes having S.I.; with a plan to harm himself by stabbing himself; verbalizes having Hi. With having thoughts to harm his mother; also, c/o hearing voices; Continue to monitor with 15 min. Monitoring.

## 2016-06-03 NOTE — Progress Notes (Signed)
[facility] by Dr. Toni Amendlapacs.  Attending Physician will be Dr. Jennet MaduroPucilowska.   [facility] Marylu LundJanet.   ER staff is aware of the admission ( ER Sect.; ER MD; Patient's Nurse &  Patient Access). Tyeson Tanimoto K. Sherlon HandingHarris, LCAS-A, LPC-A, [facility]  Counselor 06/03/2016 4:25 PM

## 2016-06-03 NOTE — ED Notes (Signed)
Patient resting quietly in room. No noted distress or abnormal behaviors noted. Will continue 15 minute checks and observation by security camera for safety. 

## 2016-06-03 NOTE — ED Notes (Signed)
Pt allowed RN to obtain VS. Pt asked if there was a specific reason he was refusing medications. Pt stated, "I don't want them in my system right now." Maintained on 15 minute checks and observation by security camera for safety.

## 2016-06-03 NOTE — ED Notes (Signed)
Pt is refusing medications at this time. Pt is otherwise polite and cooperative and in no apparent distress.

## 2016-06-03 NOTE — ED Notes (Signed)
Pt is alert and oriented this evening. Pt mood is sad, but he is pleasant and cooperative with staff. Pt denies SI/HI and AVH. Pt mostly sleeping. Writer provided food and drink and discussed tx plan. 15 minute checks are ongoing for safety.

## 2016-06-03 NOTE — ED Notes (Signed)
Pt given dinner tray. RN encouraged pt to take medications, but pt still refuses. Pt pleasant with this Clinical research associatewriter. Maintained on 15 minute checks and observation by security camera for safety.

## 2016-06-03 NOTE — ED Notes (Signed)
Pt resting in bed, watching TV. Pt compliant with medication. No concerns voiced. Maintained on 15 minute checks and observation by security camera for safety.
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
## 2016-06-04 ENCOUNTER — Inpatient Hospital Stay
Admission: AD | Admit: 2016-06-04 | Discharge: 2016-06-06 | DRG: 885 | Disposition: A | Discharge status: Home / Self Care | Payer: Medicaid Other | Source: Intra-hospital | Attending: Psychiatry | Admitting: Psychiatry

## 2016-06-04 DIAGNOSIS — K219 Gastro-esophageal reflux disease without esophagitis: Secondary | ICD-10-CM | POA: Diagnosis present

## 2016-06-04 DIAGNOSIS — Z7982 Long term (current) use of aspirin: Secondary | ICD-10-CM

## 2016-06-04 DIAGNOSIS — Z91199 Patient's noncompliance with other medical treatment and regimen due to unspecified reason: Secondary | ICD-10-CM | POA: Diagnosis present

## 2016-06-04 DIAGNOSIS — Z590 Homelessness: Secondary | ICD-10-CM | POA: Diagnosis not present

## 2016-06-04 DIAGNOSIS — E039 Hypothyroidism, unspecified: Secondary | ICD-10-CM | POA: Diagnosis present

## 2016-06-04 DIAGNOSIS — R45851 Suicidal ideations: Secondary | ICD-10-CM | POA: Diagnosis present

## 2016-06-04 DIAGNOSIS — F1420 Cocaine dependence, uncomplicated: Secondary | ICD-10-CM | POA: Diagnosis present

## 2016-06-04 DIAGNOSIS — R45850 Homicidal ideations: Secondary | ICD-10-CM | POA: Diagnosis present

## 2016-06-04 DIAGNOSIS — F1220 Cannabis dependence, uncomplicated: Secondary | ICD-10-CM | POA: Diagnosis present

## 2016-06-04 DIAGNOSIS — Z9119 Patient's noncompliance with other medical treatment and regimen: Secondary | ICD-10-CM

## 2016-06-04 DIAGNOSIS — F17210 Nicotine dependence, cigarettes, uncomplicated: Secondary | ICD-10-CM | POA: Diagnosis present

## 2016-06-04 DIAGNOSIS — Z818 Family history of other mental and behavioral disorders: Secondary | ICD-10-CM | POA: Diagnosis not present

## 2016-06-04 DIAGNOSIS — F203 Undifferentiated schizophrenia: Principal | ICD-10-CM | POA: Diagnosis present

## 2016-06-04 DIAGNOSIS — Z880 Allergy status to penicillin: Secondary | ICD-10-CM | POA: Diagnosis not present

## 2016-06-04 DIAGNOSIS — A150 Tuberculosis of lung: Secondary | ICD-10-CM

## 2016-06-04 DIAGNOSIS — Z791 Long term (current) use of non-steroidal anti-inflammatories (NSAID): Secondary | ICD-10-CM | POA: Diagnosis not present

## 2016-06-04 DIAGNOSIS — F17200 Nicotine dependence, unspecified, uncomplicated: Secondary | ICD-10-CM | POA: Diagnosis present

## 2016-06-04 DIAGNOSIS — F209 Schizophrenia, unspecified: Secondary | ICD-10-CM | POA: Diagnosis present

## 2016-06-04 DIAGNOSIS — F1020 Alcohol dependence, uncomplicated: Secondary | ICD-10-CM | POA: Diagnosis present

## 2016-06-04 DIAGNOSIS — F5105 Insomnia due to other mental disorder: Secondary | ICD-10-CM | POA: Diagnosis present

## 2016-06-04 DIAGNOSIS — Z87892 Personal history of anaphylaxis: Secondary | ICD-10-CM | POA: Diagnosis not present

## 2016-06-04 LAB — LIPID PANEL
CHOL/HDL RATIO: 2.7 ratio
Cholesterol: 176 mg/dL (ref 0–200)
HDL: 66 mg/dL (ref >40)
LDL Cholesterol: 92 mg/dL (ref 0–99)
Triglycerides: 92 mg/dL (ref <150)
VLDL: 18 mg/dL (ref 0–40)

## 2016-06-04 LAB — TSH: TSH: 4.847 u[IU]/mL — AB (ref 0.350–4.500)

## 2016-06-04 MED ORDER — ASPIRIN EC 81 MG PO TBEC
81.0000 mg | DELAYED_RELEASE_TABLET | Freq: Every day | ORAL | Status: DC
  Administered 2016-06-04 – 2016-06-06 (×3): 81 mg via ORAL
  Filled 2016-06-04 (×3): qty 1

## 2016-06-04 MED ORDER — ACETAMINOPHEN 325 MG PO TABS: 650.0000 mg | ORAL_TABLET | Freq: Four times a day (QID) | ORAL | Status: DC | PRN

## 2016-06-04 MED ORDER — HYDROXYZINE HCL 25 MG PO TABS: 25.0000 mg | ORAL_TABLET | Freq: Three times a day (TID) | ORAL | Status: DC | PRN

## 2016-06-04 MED ORDER — FAMOTIDINE 20 MG PO TABS
20.0000 mg | ORAL_TABLET | Freq: Two times a day (BID) | ORAL | Status: DC
  Administered 2016-06-04 – 2016-06-06 (×5): 20 mg via ORAL
  Filled 2016-06-04 (×5): qty 1

## 2016-06-04 MED ORDER — TRAZODONE HCL 100 MG PO TABS
100.0000 mg | ORAL_TABLET | Freq: Every day | ORAL | Status: DC
  Administered 2016-06-04 – 2016-06-05 (×2): 100 mg via ORAL
  Filled 2016-06-04 (×2): qty 1

## 2016-06-04 MED ORDER — LORAZEPAM 2 MG PO TABS: 0.0000 mg | ORAL_TABLET | Freq: Four times a day (QID) | ORAL | Status: AC

## 2016-06-04 MED ORDER — BENZTROPINE MESYLATE 1 MG PO TABS: 1.0000 mg | ORAL_TABLET | Freq: Two times a day (BID) | ORAL | Status: DC | PRN

## 2016-06-04 MED ORDER — HALOPERIDOL DECANOATE 100 MG/ML IM SOLN
100.0000 mg | Freq: Once | INTRAMUSCULAR | Status: AC
  Administered 2016-06-04: 100 mg via INTRAMUSCULAR
  Filled 2016-06-04: qty 1

## 2016-06-04 MED ORDER — MAGNESIUM HYDROXIDE 400 MG/5ML PO SUSP
30.0000 mL | Freq: Every day | ORAL | Status: DC | PRN
Start: 2016-06-04 — End: 2016-06-06

## 2016-06-04 MED ORDER — HALOPERIDOL 5 MG PO TABS
5.0000 mg | ORAL_TABLET | Freq: Every day | ORAL | Status: DC
  Administered 2016-06-04 – 2016-06-05 (×2): 5 mg via ORAL
  Filled 2016-06-04 (×2): qty 1

## 2016-06-04 MED ORDER — ALUM & MAG HYDROXIDE-SIMETH 200-200-20 MG/5ML PO SUSP: 30.0000 mL | ORAL | Status: DC | PRN

## 2016-06-04 MED ORDER — TRAZODONE HCL 100 MG PO TABS: 100.0000 mg | ORAL_TABLET | Freq: Every evening | ORAL | Status: DC | PRN

## 2016-06-04 MED ORDER — VITAMIN B-1 100 MG PO TABS
100.0000 mg | ORAL_TABLET | Freq: Every day | ORAL | Status: DC
  Administered 2016-06-04 – 2016-06-05 (×2): 100 mg via ORAL
  Filled 2016-06-04 (×2): qty 1

## 2016-06-04 MED ORDER — LEVOTHYROXINE SODIUM 50 MCG PO TABS
50.0000 ug | ORAL_TABLET | Freq: Every day | ORAL | Status: DC
  Administered 2016-06-04 – 2016-06-06 (×3): 50 ug via ORAL
  Filled 2016-06-04 (×3): qty 1
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
## 2016-06-04 NOTE — Tx Team (Signed)
Initial Treatment Plan 06/04/2016 2:02 AM Charles Charles ZOX:[id number]    PATIENT STRESSORS: Substance abuse Other: Loss of housing   PATIENT STRENGTHS: Average or above average intelligence Physical Health   PATIENT IDENTIFIED PROBLEMS: Auditory Hallucinations "Hearing voices again to hurt myself and my momma."  Suicidal and Homicidal ideation  Substance Abuse    "Finding somewhere to go."             DISCHARGE CRITERIA:  Adequate post-discharge living arrangements Improved stabilization in mood, thinking, and/or behavior Motivation to continue treatment in a less acute level of care Need for constant or close observation no longer present Verbal commitment to aftercare and medication compliance  PRELIMINARY DISCHARGE PLAN: Attend 12-step recovery group Outpatient therapy Placement in alternative living arrangements  PATIENT/FAMILY INVOLVEMENT: This treatment plan has been presented to and reviewed with the patient, Charles Charles, and/or family member.  The patient and family have been given the opportunity to ask questions and make suggestions.  Cristela FeltMichelle P Kassidy Frankson, RN 06/04/2016, 2:02 AM

## 2016-06-04 NOTE — Tx Team (Signed)
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 acetaminophen (TYLENOL) tablet 650 mg  650 mg Oral Q6H PRN Audery AmelJohn T Clapacs, MD      . alum & mag hydroxide-simeth (MAALOX/MYLANTA) 200-200-20 MG/5ML suspension 30 mL  30 mL Oral Q4H PRN Audery AmelJohn T Clapacs, MD      . aspirin EC tablet 81 mg  81 mg Oral Daily Audery AmelJohn T Clapacs, MD   81 mg at 06/04/16 0902  . benztropine (COGENTIN) tablet 1 mg  1 mg Oral BID PRN Audery AmelJohn T Clapacs, MD      . famotidine (PEPCID) tablet 20 mg  20 mg Oral BID Audery AmelJohn T Clapacs, MD   20 mg at 06/04/16 0901  . haloperidol (HALDOL) tablet 5 mg  5 mg Oral QHS Audery AmelJohn T Clapacs, MD      . hydrOXYzine (ATARAX/VISTARIL) tablet 25 mg  25 mg Oral TID PRN Audery AmelJohn T Clapacs, MD      . levothyroxine (SYNTHROID, LEVOTHROID) tablet 50 mcg  50 mcg Oral QAC breakfast Audery AmelJohn T Clapacs, MD   50 mcg at 06/04/16 0631  . LORazepam (ATIVAN) tablet 0-4 mg  0-4 mg Oral Q6H John T Clapacs, MD      . magnesium hydroxide (MILK OF MAGNESIA) suspension 30 mL  30 mL Oral Daily PRN Audery AmelJohn T Clapacs, MD      . thiamine (VITAMIN B-1) tablet 100 mg  100 mg Oral Daily Audery AmelJohn T Clapacs, MD   100 mg at 06/04/16 0901  . traZODone (DESYREL) tablet 100 mg  100 mg Oral QHS Audery AmelJohn T Clapacs, MD       PTA Medications: Prescriptions Prior to Admission  Medication Sig Dispense  Refill Last Dose  . aspirin 81 MG EC tablet Take 1 tablet (81 mg total) by mouth daily. 30 tablet 1 Past Week at Unknown time  . benztropine (COGENTIN) 1 MG tablet Take 1 mg by mouth.   Past Week at Unknown time  . famotidine (PEPCID) 20 MG tablet Take 1 tablet (20 mg total) by mouth 2 (two) times daily. 60 tablet 1 Past Week at Unknown time  . haloperidol (HALDOL) 5 MG tablet Take 1 tablet (5 mg total) by mouth at bedtime. 30 tablet 1 Past Week at Unknown time  . ibuprofen (ADVIL,MOTRIN) 600 MG tablet Take 1 tablet (600 mg total) by mouth every 6 (six) hours as needed. 30 tablet 0 Past Month at Unknown time  . levothyroxine (SYNTHROID, LEVOTHROID) 50 MCG tablet Take 1 tablet (50 mcg total) by mouth daily before breakfast. 30 tablet 1 06/03/2016 at Unknown time  . naproxen (NAPROSYN) 500 MG tablet Take 500 mg by mouth.   Past Week at Unknown time  . traZODone (DESYREL) 50 MG tablet Take 1 tablet (50 mg total) by mouth at bedtime. 30 tablet 1 Past Week at Unknown time    Patient Stressors: Substance abuse Other: Loss of housing  Patient Strengths: Average or above average intelligence  Physical Health  Treatment Modalities: Medication Management, Group therapy, Case management,  1 to 1 session with clinician, Psychoeducation, Recreational therapy.   Physician Treatment Plan for Primary Diagnosis: Undifferentiated schizophrenia (HCC) Long Term Goal(s): Improvement in symptoms so as ready for discharge Improvement in symptoms so as ready for discharge   Short Term Goals: Ability to identify changes in lifestyle to reduce recurrence of condition will improve Ability to verbalize feelings will improve Ability to disclose and discuss suicidal ideas Ability to demonstrate self-control will improve Ability to identify and develop effective coping behaviors will improve Ability to maintain clinical measurements within normal limits will improve Compliance with prescribed medications will  improve Ability to identify changes in lifestyle to reduce recurrence of condition will improve Ability to demonstrate self-control will improve Ability to identify triggers associated with substance abuse/mental health issues will improve  Medication Management: Evaluate patient's response, side effects, and tolerance of medication regimen.  Therapeutic Interventions: 1 to 1 sessions, Unit Group sessions and Medication administration.  Evaluation of Outcomes: Progressing  Physician Treatment Plan for Secondary Diagnosis: Principal Problem:   Undifferentiated schizophrenia (HCC) Active Problems:   Cocaine use disorder, moderate, dependence (HCC)   Adult hypothyroidism   Suicidal ideation   Alcohol use disorder, moderate, dependence (HCC)   GERD (gastroesophageal reflux disease)   Tobacco use disorder   Noncompliance   Cannabis use disorder, moderate, dependence (HCC)   Schizophrenia (HCC)  Long Term Goal(s): Improvement in symptoms so as ready for discharge Improvement in symptoms so as ready for discharge   Short Term Goals: Ability to identify changes in lifestyle to reduce recurrence of condition will improve Ability to verbalize feelings will improve Ability to disclose and discuss suicidal ideas Ability to demonstrate self-control will improve Ability to identify and develop effective coping behaviors will improve Ability to maintain clinical measurements within normal limits will improve Compliance with prescribed medications will improve Ability to identify changes in lifestyle to reduce recurrence of condition will improve Ability to demonstrate self-control will improve Ability to identify triggers associated with substance abuse/mental health issues will improve     Medication Management: Evaluate patient's response, side effects, and tolerance of medication regimen.  Therapeutic Interventions: 1 to 1 sessions, Unit Group sessions and Medication  administration.  Evaluation of Outcomes: Progressing   RN Treatment Plan for Primary Diagnosis: Undifferentiated schizophrenia (HCC) Long Term Goal(s): Knowledge of disease and therapeutic regimen to maintain health will improve  Short Term Goals: Ability to demonstrate self-control, Ability to participate in decision making will improve, Ability to identify and develop effective coping behaviors will improve and Compliance with prescribed medications will improve  Medication Management: RN will administer medications as ordered by provider, will assess and evaluate patient's response and provide education to patient for prescribed medication. RN will report any adverse and/or side effects to prescribing provider.  Therapeutic Interventions: 1 on 1 counseling sessions, Psychoeducation, Medication administration, Evaluate responses to treatment, Monitor vital signs and CBGs as ordered, Perform/monitor CIWA, COWS, AIMS and Fall Risk screenings as ordered, Perform wound care treatments as ordered.  Evaluation of Outcomes: Progressing   LCSW Treatment Plan for Primary Diagnosis: Undifferentiated schizophrenia (HCC) Long Term Goal(s): Safe transition to appropriate next level of care at discharge, Engage patient in therapeutic group addressing interpersonal concerns.  Short Term Goals: Engage patient in aftercare planning with referrals and resources, Increase social support, Increase ability to appropriately verbalize feelings, Increase emotional regulation, Facilitate acceptance of mental health diagnosis and concerns, Facilitate patient progression through stages of  change regarding substance use diagnoses and concerns, Identify triggers associated with mental health/substance abuse issues and Increase skills for wellness and recovery  Therapeutic Interventions: Assess for all discharge needs, 1 to 1 time with Social worker, Explore available resources and support systems, Assess for adequacy in  community support network, Educate family and significant other(s) on suicide prevention, Complete Psychosocial Assessment, Interpersonal group therapy.  Evaluation of Outcomes: Progressing   Progress in Treatment: Attending groups: Yes. Participating in groups: Yes. Taking medication as prescribed: Yes. Toleration medication: Yes. Family/Significant other contact made: No, will contact:    Patient understands diagnosis: Yes. Discussing patient identified problems/goals with staff: Yes. Medical problems stabilized or resolved: Yes. Denies suicidal/homicidal ideation: Yes. Issues/concerns per patient self-inventory: No. Other:    New problem(s) identified: No, Describe:     New Short Term/Long Term Goal(s):  Discharge Plan or Barriers:   Reason for Continuation of Hospitalization: Depression Hallucinations Medication stabilization Withdrawal symptoms  Estimated Length of Stay: 3-5 days  Attendees: Patient:Charles Charles 06/04/2016 5:07 PM  Physician: Kristine LineaJolanta [name]. MD 06/04/2016 5:07 PM  Nursing: Hulan AmatoGwen Farrish, RN 06/04/2016 5:07 PM  RN Care Manager: 06/04/2016 5:07 PM  Social Worker: Jake SharkSara Brittini Brubeck, LCSW 06/04/2016 5:07 PM  Recreational Therapist: Hershal CoriaBeth Greene, LRT 06/04/2016 5:07 PM  Other:  06/04/2016 5:07 PM  Other:  06/04/2016 5:07 PM  Other: 06/04/2016 5:07 PM    Scribe for Treatment Team: Glennon MacSara P David Rodriquez, LCSW 06/04/2016 5:07 PM
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
## 2016-06-04 NOTE — Progress Notes (Signed)
Patient stated that he is still having passive suicidal thoughts and homicidal towards his mother.Stated that his mother always argue with him & he does not want to go back to her.Attended groups.Compliant with medications.Appetite & energy level good.Support & encouragement given.

## 2016-06-04 NOTE — [facility] Group Notes (Signed)
[facility] LCSW Group Therapy   06/04/2016 2pm   Type of Therapy: Group Therapy   Participation Level: Active   Participation Quality: Attentive, Sharing and Supportive   Affect: Appropriate  Cognitive: Alert and Oriented   Insight: Developing/Improving and Engaged   Engagement in Therapy: Developing/Improving and Engaged   Modes of Intervention: Clarification, Confrontation, Discussion, Education, Exploration,  Limit-setting, Orientation, Problem-solving, Rapport Building, Dance movement psychotherapisteality Testing, Socialization and Support  Summary of Progress/Problems: The topic for group therapy was feelings about diagnosis. Pt actively participated in group discussion on their past and current diagnosis and how they feel towards this. Pt also identified how society and family members judge them, based on their diagnosis as well as stereotypes and stigmas. Pt stated that his diagnosis is schizophrenia disorder. He identified descriptive words such as annoying and guilt as how he feels towards his negative symptoms. Pt stated that locating proper resources, attending support groups, and compliance with medications are healthy coping mechanisms.     Charles Charles, MSW, LCSWA 06/04/2016, 3:26PM

## 2016-06-04 NOTE — [facility] Suicide Risk Assessment (Signed)
[facility]   Nursing information obtained from:  Patient, Review of record Demographic factors:  Male, Unemployed Current Mental Status:  Self-harm thoughts Loss Factors:  NA Historical Factors:  Prior suicide attempts Risk Reduction Factors:  Positive social support  Total Time spent with patient: 1 hour Principal Problem: Undifferentiated schizophrenia (HCC) Diagnosis:   Patient Active Problem List   Diagnosis Date Noted  . Schizophrenia (HCC) [F20.9] 06/04/2016  . Noncompliance [Z91.19] 05/06/2016  . Cannabis use disorder, moderate, dependence (HCC) [F12.20] 05/06/2016  . Chest pain [R07.9] 04/14/2016  . Abdominal pain, epigastric [R10.13] 04/14/2016  . Undifferentiated schizophrenia (HCC) [F20.3] 04/10/2016  . Alcohol use disorder, moderate, dependence (HCC) [F10.20] 04/10/2016  . GERD (gastroesophageal reflux disease) [K21.9] 04/10/2016  . Tobacco use disorder [F17.200] 04/10/2016  . Suicidal ideation [R45.851] 04/09/2016  . Stimulant abuse [F15.10] 12/14/2014  . Cocaine use disorder, moderate, dependence (HCC) [F14.20] 12/17/2013  . Adult hypothyroidism [E03.9] 12/17/2013  . Blurred vision [H53.8] 12/17/2013  . Congenital hiatus hernia [Q40.1] 10/01/2010   Subjective Data: suicidalmideation, hallucinations, substance abuse.  Continued Clinical Symptoms:  Alcohol Use Disorder Identification Test Final Score (AUDIT): 3 The "Alcohol Use Disorders Identification Test", Guidelines for Use in Primary Care, Second Edition.  World Science writerHealth Organization [facility](WHO). Score between 0-7:  no or low risk or alcohol related problems. Score between 8-15:  moderate risk of alcohol related problems. Score between 16-19:  high risk of alcohol related problems. Score 20 or above:  warrants further diagnostic evaluation for alcohol dependence and treatment.   CLINICAL FACTORS:   Alcohol/Substance Abuse/Dependencies Schizophrenia:   Command hallucinatons Depressive  state Paranoid or undifferentiated type   Musculoskeletal: Strength & Muscle Tone: within normal limits Gait & Station: normal Patient leans: N/A  Psychiatric Specialty Exam: Physical Exam  Nursing note and vitals reviewed.   Review of Systems  Psychiatric/Behavioral: Positive for depression, hallucinations, substance abuse and suicidal ideas. The patient has insomnia.   All other systems reviewed and are negative.   Blood pressure (!) 120/57, pulse 74, temperature 98.7 F (37.1 C), temperature source Oral, resp. rate 18, height 5\' 2"  (1.575 m), weight 73 kg (161 lb), SpO2 100 %.Body mass index is 29.45 kg/m.  General Appearance: Fairly Groomed  Eye Contact:  Fair  Speech:  Clear and Coherent  Volume:  Normal  Mood:  Depressed  Affect:  Blunt  Thought Process:  Goal Directed and Descriptions of Associations: Intact  Orientation:  Full (Time, Place, and Person)  Thought Content:  Hallucinations: Auditory Command:  to kill mother.  Suicidal Thoughts:  Yes.  with intent/plan  Homicidal Thoughts:  Yes.  without intent/plan  Memory:  Immediate;   Fair Recent;   Fair Remote;   Fair  Judgement:  Poor  Insight:  Lacking  Psychomotor Activity:  Normal  Concentration:  Concentration: Fair and Attention Span: Fair  Recall:  [employer]  Fund of Knowledge:  Fair  Language:  Fair  Akathisia:  No  Handed:  Right  AIMS (if indicated):     Assets:  Communication Skills Desire for Improvement Financial Resources/Insurance Physical Health Resilience Social Support  ADL's:  Intact  Cognition:  WNL  Sleep:  Number of Hours: 3.25      COGNITIVE FEATURES THAT CONTRIBUTE TO RISK:  None    SUICIDE RISK:   Moderate:  Frequent suicidal ideation with limited intensity, and duration, some specificity in terms of plans, no associated intent, good self-control, limited dysphoria/symptomatology, some risk factors present, and identifiable protective factors, including  available and accessible  social support.   PLAN OF CARE: hospital admission, medication management, substance abuse counseling, discharge planning.   Charles Charles is a 55 year old male with a history of schizophrenia and substance use admitted for worsening of psychosis, suicidal or homicidal ideation, and substance use in the context of treatment noncompliance.  1. Suicidal and homicidal ideation. The patient is able to contract for safety in the hospital.   2. Psychosis. He was restarted again on Haldol and Cogentin for psychosis. He will be given 100 mg of  Haldol decanoate injection on 06/04/2016 to improve compliance.  3. Insomnia. Trazodone is available.  4. GERD. He is on Pepcid.  5. Metabolic syndrome monitoring. Lipid profile is normal. TSH slightly elevated. HgbA1C pending.    6. Substance abuse. BAL was elevated. He was started on CIWA protocol. As usual, the patient was positive for cocaine and cannabis.   7. Hypothyroidism. He is on Synthroid.  8. Substance abuse treatment. The patient is not interested in residential, long term treatment.   9. EKG. Normal sinus rhythm. QT 384.    10. To be established. [facility].  I certify that inpatient services furnished can reasonably be expected to improve the patient's condition.  Charles LineaJolanta Sherlynn Tourville, MD 06/04/2016, 8:57 AM

## 2016-06-04 NOTE — Progress Notes (Signed)
Recreation Therapy Notes  Date: 12.05.17 Time: 9:30 am Location: Craft Room  Group Topic: Goal Setting  Goal Area(s) Addresses:  Patient will write at least one goal. Patient will write at least one obstacle.  Behavioral Response: Did not attend  Intervention: Recovery Goal Chart  Activity: Patients were instructed to make a recovery goal chart including goals, obstacles, the date they started working on their goals, and the date they achieved them.  Education: LRT educated patients on ways to celebrate reaching their goals in healthy ways.  Education Outcome: Patient did not attend group.  Clinical Observations/Feedback: Patient did not attend group.  Susana Gripp M, LRT/CTRS 06/04/2016 10:17 AM 

## 2016-06-05 ENCOUNTER — Encounter: Payer: Self-pay | Admitting: *Deleted

## 2016-06-05 LAB — PROLACTIN: PROLACTIN: 18.8 ng/mL — AB (ref 4.0–15.2)

## 2016-06-05 LAB — LIPID PANEL
Cholesterol: 179 mg/dL (ref 0–200)
HDL: 66 mg/dL (ref >40)
LDL Cholesterol: 96 mg/dL (ref 0–99)
TRIGLYCERIDES: 85 mg/dL (ref <150)
Total CHOL/HDL Ratio: 2.7 RATIO
VLDL: 17 mg/dL (ref 0–40)

## 2016-06-05 LAB — HEMOGLOBIN A1C
Hgb A1c MFr Bld: 5.6 % (ref 4.8–5.6)
MEAN PLASMA GLUCOSE: 114 mg/dL

## 2016-06-05 LAB — TSH: TSH: 4.453 u[IU]/mL (ref 0.350–4.500)

## 2016-06-05 MED ORDER — NICOTINE 21 MG/24HR TD PT24
21.0000 mg | MEDICATED_PATCH | Freq: Every day | TRANSDERMAL | Status: DC
  Administered 2016-06-05 – 2016-06-06 (×2): 21 mg via TRANSDERMAL
  Filled 2016-06-05 (×2): qty 1

## 2016-06-05 NOTE — [facility] Group Notes (Signed)
[facility] Group Notes:  (Nursing/MHT/Case Management/Adjunct)  Date:  06/05/2016  Time:  4:13 PM  Type of Therapy:  Psychoeducational Skills  Participation Level:  Active  Participation Quality:  Appropriate, Attentive and Supportive  Affect:  Appropriate  Cognitive:  Appropriate  Insight:  Appropriate  Engagement in Group:  Engaged and Supportive  Modes of Intervention:  Discussion, Education and Exploration  Summary of Progress/Problems:  Charles Charles 06/05/2016, 4:13 PM

## 2016-06-05 NOTE — [facility] Counselor (Signed)
Adult Comprehensive Assessment  Patient ZO:XWRUE:Charles Charles, maleDOB:06/03/1961, 55 y.o.AVW:[id number]  Information Source: Information source: Patient  Current Stressors:  Educational / Learning stressors: No stressors identified  Employment / Job issues: No stressors identified  Family Relationships: No stressors identified  Surveyor, quantityinancial / Lack of resources (include bankruptcy): Pt has no money until disability starts paying at first of month Housing / Lack of housing: Pt is homeless  Physical health (include injuries &life threatening diseases): Pt is hearing voices Social relationships: No stressors identified  Substance abuse: Alcohol and cocaine use  Bereavement / Loss: No stressors identified   Living/Environment/Situation:  Living Arrangements:Pt is homeless Living conditions (as described by patient or guardian): Unknown lived in current situation?: About two years  What is atmosphere in current home: Chaotic, Temporary  Family History:  Marital status: Separated Separated, when?: A couple years  What types of issues is patient dealing with in the relationship?: Unknown  What is your sexual orientation?: Straight  Has your sexual activity been affected by drugs, alcohol, medication, or emotional stress?: N/A Does patient have children?: Yes How many children?: 7 How is patient's relationship with their children?: 5 girls, two boys - does not see children very often   Childhood History:  By whom was/is the patient raised?: Mother Description of patient's relationship with caregiver when they were a child: "It was alright"  Patient's description of current relationship with people who raised him/her: "It was alright" How were you disciplined when you got in trouble as a child/adolescent?: Whoopings  Does patient have siblings?: Yes Number of Siblings: 2 Description of patient's current relationship with siblings: One brother and one sister; no relationship at  all Did patient suffer any verbal/emotional/physical/sexual abuse as a child?: No Did patient suffer from severe childhood neglect?: No Has patient ever been sexually abused/assaulted/raped as an adolescent or adult?: No Was the patient ever a victim of a crime or a disaster?: No Witnessed domestic violence?: No Has patient been effected by domestic violence as an adult?: No  Education:  Highest grade of school patient has completed: 8th or 9th grade Currently a student?: No Learning disability?: Yes (Can't read or write ) What learning problems does patient have?: Cannot read or write   Employment/Work Situation:  Employment situation: On disability Why is patient on disability: Mental health  How long has patient been on disability: Since 1998 Patient's job has been impacted by current illness: No What is the longest time patient has a held a job?: About two years  Where was the patient employed at that time?: [employer]  Has patient ever been in the Eli Lilly and Companymilitary?: No Has patient ever served in combat?: No Did You Receive Any Psychiatric Treatment/Services While in Equities traderthe Military?: No Are There Guns or Other Weapons in Your Home?: No Are These ComptrollerWeapons Safely Secured?: (N/A)  Financial Resources:  Surveyor, quantityinancial resources: Sales executiveood stamps, Medicaid, Insurance claims handlereceives SSDI Does patient have a Lawyerrepresentative payee or guardian?: No  Alcohol/Substance Abuse:  What has been your use of drugs/alcohol within the last 12 months?: Alcohol and crack cocaine  If attempted suicide, did drugs/alcohol play a role in this?: No Alcohol/Substance Abuse Treatment Hx: Denies past history Has alcohol/substance abuse ever caused legal problems?: Yes (DUI)  Social Support System: Patient's Community Support System: Fair Museum/gallery exhibitions officerDescribe Community Support System: Does not identify a strong support system at this time Type of faith/religion: None identified  How does patient's faith help to cope with current illness?:  None identified   Leisure/Recreation:  Leisure and  Hobbies: Use to love playing basketball - does not do anything currently  Strengths/Needs:  What things does the patient do well?: Good at playing basketball and dancing  In what areas does patient struggle / problems for patient: Reading, writing  Discharge Plan:  Does patient have access to transportation?: Pt's mother Plan for no access to transportation at discharge: [employer] bus Will patient be returning to same living situation after discharge?: Yes Currently receiving community mental health services: No (From [facility]) ([facility] ) Does patient have financial barriers related to discharge medications?: No  Summary/Recommendations:  Summary and Recommendations (to be completed by the evaluator): Patient presented to the hospital voluntarily and was admitted for AVH. Patient is a [age] year AA male with a primary diagnosis of schizophrenia. Pt reports primary triggers for admission were hearing voices telling him to hurt himself and/or others. Pt now denies SI/HI/AVH. [place] Pt lists supports in the community as his mother. Patient will benefit from crisis stabilization, medication evaluation, group therapy, and psycho education in addition to case management for discharge planning. Patient and CSW reviewed pt's identified goals and treatment plan. Pt verbalized understanding and agreed to treatment plan. At discharge it is recommended that patient remain compliant with established plan and continue treatment.  Hampton AbbotKadijah Davielle Lingelbach, MSW, LCSW-A 06/05/2016, 1:13PM

## 2016-06-05 NOTE — [place] FL2 (Signed)
[place] MEDICAID FL2 LEVEL OF CARE SCREENING TOOL     IDENTIFICATION  Patient Name: Charles CaveGarry L Zahn Birthdate: 07/18/1960 Sex: male Admission Date (Current Location): 06/04/2016  [place]  [place] [id number] [facility] Facility and Address:  [facility], [address], [place], [place] [address]      Provider Number: [id number]  Attending Physician Name and Address:  Shari ProwsJolanta B [name], MD  Relative Name and Phone Number:  Blair HeysBetty Harvey ph#: [phone]    Current Level of Care: Hospital Recommended Level of Care: Family Care Home Prior Approval Number:    Date Approved/Denied:   PASRR Number:    Discharge Plan: Other (Comment) (Group home)    Current Diagnoses: Patient Active Problem List   Diagnosis Date Noted  . Schizophrenia (HCC) 06/04/2016  . Noncompliance 05/06/2016  . Cannabis use disorder, moderate, dependence (HCC) 05/06/2016  . Chest pain 04/14/2016  . Abdominal pain, epigastric 04/14/2016  . 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  . Alcohol use disorder, moderate, dependence (HCC) 04/10/2016  . GERD (gastroesophageal reflux disease) 04/10/2016  . Tobacco use disorder 04/10/2016  . Suicidal ideation 04/09/2016  . Stimulant abuse 12/14/2014  . Cocaine use disorder, moderate, dependence (HCC) 12/17/2013  . Adult hypothyroidism 12/17/2013  . Blurred vision 12/17/2013  . Congenital hiatus hernia 10/01/2010    Orientation RESPIRATION BLADDER Height & Weight     Self, Time, Situation, Place  Normal Continent Weight: 161 lb (73 kg) Height:  5\' 2"  (157.5 cm)  BEHAVIORAL SYMPTOMS/MOOD NEUROLOGICAL BOWEL NUTRITION STATUS      Continent  (Normal)  AMBULATORY STATUS COMMUNICATION OF NEEDS Skin   Independent Verbally Normal                       Personal Care Assistance Level of Assistance   (N/A)           Functional Limitations Info   (N/A)          SPECIAL CARE FACTORS FREQUENCY                        Contractures Contractures Info: Not present    Additional Factors Info                  Current Medications (06/05/2016):  This is the current hospital active medication list Current Facility-Administered Medications  Medication Dose Route Frequency Provider Last Rate Last Dose  . acetaminophen (TYLENOL) tablet 650 mg  650 mg Oral Q6H PRN Audery AmelJohn T Clapacs, MD      . alum & mag hydroxide-simeth (MAALOX/MYLANTA) 200-200-20 MG/5ML suspension 30 mL  30 mL Oral Q4H PRN Audery AmelJohn T Clapacs, MD      . aspirin EC tablet 81 mg  81 mg Oral Daily Audery AmelJohn T Clapacs, MD   81 mg at 06/05/16 0847  . benztropine (COGENTIN) tablet 1 mg  1 mg Oral BID PRN Audery AmelJohn T Clapacs, MD      . famotidine (PEPCID) tablet 20 mg  20 mg Oral BID Audery AmelJohn T Clapacs, MD   20 mg at 06/05/16 0847  . haloperidol (HALDOL) tablet 5 mg  5 mg Oral QHS Audery AmelJohn T Clapacs, MD   5 mg at 06/04/16 2232  . hydrOXYzine (ATARAX/VISTARIL) tablet 25 mg  25 mg Oral TID PRN Audery AmelJohn T Clapacs, MD      . levothyroxine (SYNTHROID, LEVOTHROID) tablet 50 mcg  50 mcg Oral QAC breakfast Audery AmelJohn T Clapacs, MD   50 mcg at  06/05/16 0734  . magnesium hydroxide (MILK OF MAGNESIA) suspension 30 mL  30 mL Oral Daily PRN Audery AmelJohn T Clapacs, MD      . thiamine (VITAMIN B-1) tablet 100 mg  100 mg Oral Daily Audery AmelJohn T Clapacs, MD   100 mg at 06/05/16 0847  . traZODone (DESYREL) tablet 100 mg  100 mg Oral QHS Audery AmelJohn T Clapacs, MD   100 mg at 06/04/16 2232     Discharge Medications: Please see discharge summary for a list of discharge medications.  Relevant Imaging Results:  Relevant Lab Results:   Additional Information    Lynden OxfordKadijah R Jerzi Tigert, LCSWA

## 2016-06-05 NOTE — [facility] Suicide Risk Assessment (Signed)
CSW attempted to call mother, Charles Charles @ ph#: [phone]. Pt's granddaughter answered and stated that pt's mother was not available at the time and will return CSW call at her earliest convenience.      Charles Charles, MSW, LCSW-A 06/05/2016, 1:43PM

## 2016-06-05 NOTE — Plan of Care (Signed)
Problem: Coping: Goal: Ability to cope will improve Outcome: Progressing Pt calm and cooperative. Med and group compliant.

## 2016-06-05 NOTE — [facility] Group Notes (Signed)
[facility] LCSW Group Therapy Note  Date/Time:06/05/2016, 1:00pm  Type of Therapy/Topic:  Group Therapy:  Emotion Regulation  Participation Level:  Active   Mood: Good Reported  Description of Group:    The purpose of this group is to assist patients in learning to regulate negative emotions and experience positive emotions. Patients will be guided to discuss ways in which they have been vulnerable to their negative emotions. These vulnerabilities will be juxtaposed with experiences of positive emotions or situations, and patients challenged to use positive emotions to combat negative ones. Special emphasis will be placed on coping with negative emotions in conflict situations, and patients will process healthy conflict resolution skills.  Therapeutic Goals: 1. Patient will identify two positive emotions or experiences to reflect on in order to balance out negative emotions:  2. Patient will label two or more emotions that they find the most difficult to experience:  3. Patient will be able to demonstrate positive conflict resolution skills through discussion or role plays:   Summary of Patient Progress:  Pt participated well and was able to achieve above therapeutic goals.    Therapeutic Modalities:   Cognitive Behavioral Therapy Feelings Identification Dialectical Behavioral Therapy  Alejandra Barna, LCSW 

## 2016-06-05 NOTE — Progress Notes (Signed)
Pt presents with sad, flat affect. Denies SI, HI, AVH, but endorses depression. Medication and group compliant. Guarded and appropriate with staff and peers.  Encouragement and support offered. Encouraged patient to verbalize feelings. Safety checks maintained. Pt receptive and remains safe on unit with q 15 min checks.

## 2016-06-05 NOTE — Progress Notes (Signed)
D: Pt denies SI/HI/AVH. Pt is sad and guarded, affect is flat but cooperative with treatment plan. Pt appears  Anxious, minimal interaction with staff.  A: Pt was offered support and encouragement. Pt was given scheduled medications. Pt was encouraged to attend groups. Q 15 minute checks were done for safety.  R:Pt did not attend group. Pt is taking medication. Pt has no complaints.Pt receptive to treatment and safety maintained on unit.

## 2016-06-05 NOTE — Progress Notes (Signed)
Recreation Therapy Notes  Date: 12.06.17 Time: 9:30 am Location: Craft Room  Group Topic: Self-esteem  Goal Area(s) Addresses:  Patient will identify at least one positive trait about self. Patient will identify at least one healthy coping skill.  Behavioral Response: Did not attend  Intervention: All About Me  Activity: Patients were instructed to make an All About Me pamphlet including their life's motto, positive traits, healthy coping skills, and their support system.   Education: LRT educated patients on ways they can increase their self-esteem.  Education Outcome: Patient did not attend group.   Clinical Observations/Feedback: Patient did not attend group.  Jacquelynn CreeGreene,Sumner Kirchman M, LRT/CTRS 06/05/2016 10:06 AM

## 2016-06-05 NOTE — Progress Notes (Signed)
[facility] MD Progress Note  06/05/2016 1:02 PM Charles Charles  MRN:  [id number]  Subjective:  Charles Charles still reports severe depression, auditory command hallucinations, and suicidal ideation. He is physically not well after cocaine binge. He was restarted on Haldol and tolerates it well. He will meet with the social worker to discuss substance abuse treatment options.  Per nursing:  D: Pt denies SI/HI/AVH. Pt is sad and guarded, affect is flat but cooperative with treatment plan. Pt appears  Anxious, minimal interaction with staff.  A: Pt was offered support and encouragement. Pt was given scheduled medications. Pt was encouraged to attend groups. Q 15 minute checks were done for safety.  R:Pt did not attend group. Pt is taking medication. Pt has no complaints.Pt receptive to treatment and safety maintained on unit.  Principal Problem: Undifferentiated schizophrenia (HCC) Diagnosis:   Patient Active Problem List   Diagnosis Date Noted  . Schizophrenia (HCC) [F20.9] 06/04/2016  . Noncompliance [Z91.19] 05/06/2016  . Cannabis use disorder, moderate, dependence (HCC) [F12.20] 05/06/2016  . Chest pain [R07.9] 04/14/2016  . Abdominal pain, epigastric [R10.13] 04/14/2016  . Undifferentiated schizophrenia (HCC) [F20.3] 04/10/2016  . Alcohol use disorder, moderate, dependence (HCC) [F10.20] 04/10/2016  . GERD (gastroesophageal reflux disease) [K21.9] 04/10/2016  . Tobacco use disorder [F17.200] 04/10/2016  . Suicidal ideation [R45.851] 04/09/2016  . Stimulant abuse [F15.10] 12/14/2014  . Cocaine use disorder, moderate, dependence (HCC) [F14.20] 12/17/2013  . Adult hypothyroidism [E03.9] 12/17/2013  . Blurred vision [H53.8] 12/17/2013  . Congenital hiatus hernia [Q40.1] 10/01/2010   Total Time spent with patient: 20 minutes  Past Psychiatric History: Schizophrenia, substance use.  Past Medical History:  Past Medical History:  Diagnosis Date  . GERD (gastroesophageal reflux disease)   .  Headache   . Schizo-affective schizophrenia [facility](HCC)     Past Surgical History:  Procedure Laterality Date  . COLONOSCOPY WITH PROPOFOL N/A 08/04/2015   Procedure: COLONOSCOPY WITH PROPOFOL;  Surgeon: Midge Miniumarren Wohl, MD;  [facility];  Service: Endoscopy;  Laterality: N/A;  . INCISION AND DRAINAGE PERIRECTAL ABSCESS N/A 07/14/2015   Procedure: IRRIGATION AND DEBRIDEMENT PERIRECTAL ABSCESS;  Surgeon: Lattie Hawichard E Cooper, MD;  Location: [facility];  Service: General;  Laterality: N/A;  . INCISION AND DRAINAGE PERIRECTAL ABSCESS N/A 08/14/2015   Procedure: IRRIGATION AND DEBRIDEMENT PERIRECTAL ABSCESS;  Surgeon: Gladis Riffleatherine L Loflin, MD;  Location: [facility];  Service: General;  Laterality: N/A;   Family History:  Family History  Problem Relation Age of Onset  . Anxiety disorder Mother   . Diabetes Mother   . Diabetes Father    Family Psychiatric  History: See H&P. Social History:  History  Alcohol Use  . 33.6 oz/week  . 56 Cans of beer per week    Comment: 2 x 40's daily - pt says no alcohol for several weeks     History  Drug Use  . Types: Cocaine, Marijuana    Comment: back in the days    Social History   Social History  . Marital status: Single    Spouse name: N/A  . Number of children: N/A  . Years of education: N/A   Social History Main Topics  . Smoking status: Current Every Day Smoker    Packs/day: 2.00    Types: Cigarettes  . Smokeless tobacco: Never Used     Comment: (1-2 cigs/day)  . Alcohol use 33.6 oz/week    56 Cans of beer per week     Comment: 2 x 40's daily -  pt says no alcohol for several weeks  . Drug use:     Types: Cocaine, Marijuana     Comment: back in the days  . Sexual activity: No   Other Topics Concern  . None   Social History Narrative  . None   Additional Social History:    Pain Medications: see PTA meds Prescriptions: see PTA meds Over the Counter: see PTA meds History of alcohol / drug use?: Yes                     Sleep: Fair  Appetite:  Poor  Current Medications: Current Facility-Administered Medications  Medication Dose Route Frequency Provider Last Rate Last Dose  . acetaminophen (TYLENOL) tablet 650 mg  650 mg Oral Q6H PRN Audery Amel, MD      . alum & mag hydroxide-simeth (MAALOX/MYLANTA) 200-200-20 MG/5ML suspension 30 mL  30 mL Oral Q4H PRN Audery Amel, MD      . aspirin EC tablet 81 mg  81 mg Oral Daily Audery Amel, MD   81 mg at 06/05/16 0847  . benztropine (COGENTIN) tablet 1 mg  1 mg Oral BID PRN Audery Amel, MD      . famotidine (PEPCID) tablet 20 mg  20 mg Oral BID Audery Amel, MD   20 mg at 06/05/16 0847  . haloperidol (HALDOL) tablet 5 mg  5 mg Oral QHS Audery Amel, MD   5 mg at 06/04/16 2232  . hydrOXYzine (ATARAX/VISTARIL) tablet 25 mg  25 mg Oral TID PRN Audery Amel, MD      . levothyroxine (SYNTHROID, LEVOTHROID) tablet 50 mcg  50 mcg Oral QAC breakfast Audery Amel, MD   50 mcg at 06/05/16 0734  . magnesium hydroxide (MILK OF MAGNESIA) suspension 30 mL  30 mL Oral Daily PRN Audery Amel, MD      . thiamine (VITAMIN B-1) tablet 100 mg  100 mg Oral Daily Audery Amel, MD   100 mg at 06/05/16 0847  . traZODone (DESYREL) tablet 100 mg  100 mg Oral QHS Audery Amel, MD   100 mg at 06/04/16 2232    Lab Results:  Results for orders placed or performed during the hospital encounter of 06/04/16 (from the past 48 hour(s))  Hemoglobin A1c     Status: None   Collection Time: 06/04/16  6:55 AM  Result Value Ref Range   Hgb A1c MFr Bld 5.6 4.8 - 5.6 %    Comment: (NOTE)         Pre-diabetes: 5.7 - 6.4         Diabetes: >6.4         Glycemic control for adults with diabetes: <7.0    Mean Plasma Glucose 114 mg/dL    Comment: (NOTE) Performed At: [facility] [address] [place], [place] [id number] Mila Homer MD [id number]   Lipid panel     Status: None   Collection Time: 06/04/16  6:55 AM  Result Value Ref Range   Cholesterol 176  0 - 200 mg/dL   Triglycerides 92 <308 mg/dL   HDL 66 >65 mg/dL   Total CHOL/HDL Ratio 2.7 RATIO   VLDL 18 0 - 40 mg/dL   LDL Cholesterol 92 0 - 99 mg/dL    Comment:        Total Cholesterol/HDL:CHD Risk Coronary Heart Disease Risk Table  Men   Women  1/2 Average Risk   3.4   3.3  Average Risk       5.0   4.4  2 X Average Risk   9.6   7.1  3 X Average Risk  23.4   11.0        Use the calculated Patient Ratio above and the CHD Risk Table to determine the patient's CHD Risk.        ATP III CLASSIFICATION (LDL):  <100     mg/dL   Optimal  098-119  mg/dL   Near or Above                    Optimal  130-159  mg/dL   Borderline  147-829  mg/dL   High  >562     mg/dL   Very High   Prolactin     Status: Abnormal   Collection Time: 06/04/16  6:55 AM  Result Value Ref Range   Prolactin 18.8 (H) 4.0 - 15.2 ng/mL    Comment: (NOTE) Performed At: [facility] [address] [place], [place] [id number] Mila Homer MD ON:[id number]   TSH     Status: Abnormal   Collection Time: 06/04/16  6:55 AM  Result Value Ref Range   TSH 4.847 (H) 0.350 - 4.500 uIU/mL    Comment: Performed by a 3rd Generation assay with a functional sensitivity of <=0.01 uIU/mL.  Lipid panel     Status: None   Collection Time: 06/05/16  6:37 AM  Result Value Ref Range   Cholesterol 179 0 - 200 mg/dL   Triglycerides 85 <440 mg/dL   HDL 66 >10 mg/dL   Total CHOL/HDL Ratio 2.7 RATIO   VLDL 17 0 - 40 mg/dL   LDL Cholesterol 96 0 - 99 mg/dL    Comment:        Total Cholesterol/HDL:CHD Risk Coronary Heart Disease Risk Table                     Men   Women  1/2 Average Risk   3.4   3.3  Average Risk       5.0   4.4  2 X Average Risk   9.6   7.1  3 X Average Risk  23.4   11.0        Use the calculated Patient Ratio above and the CHD Risk Table to determine the patient's CHD Risk.        ATP III CLASSIFICATION (LDL):  <100     mg/dL   Optimal  272-536  mg/dL   Near or Above                     Optimal  130-159  mg/dL   Borderline  644-034  mg/dL   High  >742     mg/dL   Very High   TSH     Status: None   Collection Time: 06/05/16  6:37 AM  Result Value Ref Range   TSH 4.453 0.350 - 4.500 uIU/mL    Comment: Performed by a 3rd Generation assay with a functional sensitivity of <=0.01 uIU/mL.    Blood Alcohol level:  Lab Results  Component Value Date   ETH 202 (H) 06/02/2016   ETH <5 05/05/2016    Metabolic Disorder Labs: Lab Results  Component Value Date   HGBA1C 5.6 06/04/2016   MPG 114 06/04/2016   MPG 111 04/11/2016  Lab Results  Component Value Date   PROLACTIN 18.8 (H) 06/04/2016   Lab Results  Component Value Date   CHOL 179 06/05/2016   TRIG 85 06/05/2016   HDL 66 06/05/2016   CHOLHDL 2.7 06/05/2016   VLDL 17 06/05/2016   LDLCALC 96 06/05/2016   LDLCALC 92 06/04/2016    Physical Findings: AIMS: Facial and Oral Movements Muscles of Facial Expression: None, normal Lips and Perioral Area: None, normal Jaw: None, normal Tongue: None, normal,Extremity Movements Upper (arms, wrists, hands, fingers): None, normal Lower (legs, knees, ankles, toes): None, normal, Trunk Movements Neck, shoulders, hips: None, normal, Overall Severity Severity of abnormal movements (highest score from questions above): None, normal Incapacitation due to abnormal movements: None, normal Patient's awareness of abnormal movements (rate only patient's report): No Awareness, Dental Status Current problems with teeth and/or dentures?: No Does patient usually wear dentures?: No  CIWA:  CIWA-Ar Total: 0 COWS:     Musculoskeletal: Strength & Muscle Tone: within normal limits Gait & Station: normal Patient leans: N/A  Psychiatric Specialty Exam: Physical Exam  Nursing note and vitals reviewed.   Review of Systems  Constitutional: Positive for malaise/fatigue.  Psychiatric/Behavioral: Positive for depression, hallucinations, substance abuse and suicidal  ideas.    Blood pressure 100/66, pulse 77, temperature 98.3 F (36.8 C), temperature source Oral, resp. rate 18, height 5\' 2"  (1.575 m), weight 73 kg (161 lb), SpO2 100 %.Body mass index is 29.45 kg/m.  General Appearance: Fairly Groomed  Eye Contact:  Minimal  Speech:  Slow  Volume:  Decreased  Mood:  Depressed and Worthless  Affect:  Blunt  Thought Process:  Goal Directed and Descriptions of Associations: Intact  Orientation:  Full (Time, Place, and Person)  Thought Content:  Hallucinations: Auditory  Suicidal Thoughts:  Yes.  with intent/plan  Homicidal Thoughts:  No  Memory:  Immediate;   Fair Recent;   Fair Remote;   Fair  Judgement:  Impaired  Insight:  Lacking  Psychomotor Activity:  Psychomotor Retardation  Concentration:  Concentration: Fair and Attention Span: Fair  Recall:  [employer] of Knowledge:  Fair  Language:  Fair  Akathisia:  No  Handed:  Right  AIMS (if indicated):     Assets:  Communication Skills Desire for Improvement Financial Resources/Insurance Physical Health Resilience Social Support  ADL's:  Intact  Cognition:  WNL  Sleep:  Number of Hours: 6.75     Treatment Plan Summary: Daily contact with patient to assess and evaluate symptoms and progress in treatment and Medication management   Charles Charles is a 55 year old male with a history of schizophrenia and substance use admitted for worsening of psychosis, suicidal or homicidal ideation, and substance use in the context of treatment noncompliance.  1. Suicidal and homicidal ideation. The patient is able to contract for safety in the hospital.   2. Psychosis. He was restarted again on Haldol and Cogentin for psychosis. He was given 100 mg of Haldol decanoate injection on 06/04/2016 to improve compliance.  3. Insomnia. Trazodone is available.  4. GERD. He is on Pepcid.  5. Metabolic syndrome monitoring. Lipid profile is normal. TSH slightly elevated. HgbA1C pending.    6. Substance  abuse. BAL was elevated. He was started on CIWA protocol. As usual, the patient was positive for cocaine and cannabis.   7. Hypothyroidism. He is on Synthroid.  8. Substance abuse treatment. The patient is now interested in residential, long term treatment.   9. EKG. Normal sinus rhythm. QT 384.  10. Social. This is a third admission in the same scenario. In October, November, and December the patient spent his money on drugs and then energized in the emergency room suicidal. We will) into Social Security disability office to suggest a payee.   11. To be established. [facility].    Kristine LineaJolanta Mycah Formica, MD 06/05/2016, 1:02 PM

## 2016-06-06 ENCOUNTER — Inpatient Hospital Stay: Payer: Medicaid Other

## 2016-06-06 LAB — PROLACTIN: PROLACTIN: 61.5 ng/mL — AB (ref 4.0–15.2)

## 2016-06-06 LAB — HEMOGLOBIN A1C
HEMOGLOBIN A1C: 5.7 % — AB (ref 4.8–5.6)
MEAN PLASMA GLUCOSE: 117 mg/dL

## 2016-06-06 MED ORDER — BENZTROPINE MESYLATE 1 MG PO TABS: 1.0000 mg | ORAL_TABLET | Freq: Every day | ORAL | 1 refills | Status: DC

## 2016-06-06 MED ORDER — HALOPERIDOL DECANOATE 100 MG/ML IM SOLN: 100.0000 mg | INTRAMUSCULAR | Status: DC

## 2016-06-06 MED ORDER — TUBERCULIN PPD 5 UNIT/0.1ML ID SOLN
5.0000 [IU] | Freq: Once | INTRADERMAL | Status: DC
  Filled 2016-06-06: qty 0.1

## 2016-06-06 MED ORDER — HALOPERIDOL DECANOATE 100 MG/ML IM SOLN: 100.0000 mg | INTRAMUSCULAR | 1 refills | Status: DC

## 2016-06-06 MED ORDER — TRAZODONE HCL 50 MG PO TABS: 50.0000 mg | ORAL_TABLET | Freq: Every day | ORAL | 1 refills | Status: DC

## 2016-06-06 MED ORDER — LEVOTHYROXINE SODIUM 50 MCG PO TABS: 50.0000 ug | ORAL_TABLET | Freq: Every day | ORAL | 1 refills | Status: DC

## 2016-06-06 MED ORDER — FAMOTIDINE 20 MG PO TABS: 20.0000 mg | ORAL_TABLET | Freq: Two times a day (BID) | ORAL | 1 refills | Status: DC

## 2016-06-06 MED ORDER — ASPIRIN 81 MG PO TBEC: 81.0000 mg | DELAYED_RELEASE_TABLET | Freq: Every day | ORAL | 1 refills | Status: DC

## 2016-06-06 MED ORDER — IBUPROFEN 600 MG PO TABS: 600.0000 mg | ORAL_TABLET | Freq: Four times a day (QID) | ORAL | 0 refills | Status: DC | PRN

## 2016-06-06 NOTE — [place] FL2 (Signed)
[place] MEDICAID FL2 LEVEL OF CARE SCREENING TOOL     IDENTIFICATION  Patient Name: Charles CaveGarry L Stroope Birthdate: 11/06/1960 Sex: male Admission Date (Current Location): 06/04/2016  [place]  [place] [id number] [facility] Facility and Address:  [facility], [address], [place], [place] [address]      Provider Number: [id number]  Attending Physician Name and Address:  Shari ProwsJolanta B [name], MD  Relative Name and Phone Number:  Blair HeysBetty Harvey ph#: [phone]    Current Level of Care: Hospital Recommended Level of Care: Family Care Home Prior Approval Number:    Date Approved/Denied:   PASRR Number:    Discharge Plan: Other (Comment)    Current Diagnoses: Patient Active Problem List   Diagnosis Date Noted  . Schizophrenia (HCC) 06/04/2016  . Noncompliance 05/06/2016  . Cannabis use disorder, moderate, dependence (HCC) 05/06/2016  . Chest pain 04/14/2016  . Abdominal pain, epigastric 04/14/2016  . 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  . Alcohol use disorder, moderate, dependence (HCC) 04/10/2016  . GERD (gastroesophageal reflux disease) 04/10/2016  . Tobacco use disorder 04/10/2016  . Suicidal ideation 04/09/2016  . Stimulant abuse 12/14/2014  . Cocaine use disorder, moderate, dependence (HCC) 12/17/2013  . Adult hypothyroidism 12/17/2013  . Blurred vision 12/17/2013  . Congenital hiatus hernia 10/01/2010    Orientation RESPIRATION BLADDER Height & Weight     Self, Time, Situation, Place  Normal Continent Weight: 161 lb (73 kg) Height:  5\' 2"  (157.5 cm)  BEHAVIORAL SYMPTOMS/MOOD NEUROLOGICAL BOWEL NUTRITION STATUS      Continent  (Normal)  AMBULATORY STATUS COMMUNICATION OF NEEDS Skin   Independent Verbally Normal                       Personal Care Assistance Level of Assistance   (N/A)           Functional Limitations Info   (N/A)          SPECIAL CARE FACTORS FREQUENCY                       Contractures Contractures Info: Not present    Additional Factors Info                  Current Medications (06/06/2016):  This is the current hospital active medication list Current Facility-Administered Medications  Medication Dose Route Frequency Provider Last Rate Last Dose  . acetaminophen (TYLENOL) tablet 650 mg  650 mg Oral Q6H PRN Audery AmelJohn T Clapacs, MD      . alum & mag hydroxide-simeth (MAALOX/MYLANTA) 200-200-20 MG/5ML suspension 30 mL  30 mL Oral Q4H PRN Audery AmelJohn T Clapacs, MD      . aspirin EC tablet 81 mg  81 mg Oral Daily Audery AmelJohn T Clapacs, MD   81 mg at 06/06/16 0837  . benztropine (COGENTIN) tablet 1 mg  1 mg Oral BID PRN Audery AmelJohn T Clapacs, MD      . famotidine (PEPCID) tablet 20 mg  20 mg Oral BID Audery AmelJohn T Clapacs, MD   20 mg at 06/06/16 0837  . [START ON 07/02/2016] haloperidol decanoate (HALDOL DECANOATE) 100 MG/ML injection 100 mg  100 mg Intramuscular Q30 days Jolanta B [name], MD      . levothyroxine (SYNTHROID, LEVOTHROID) tablet 50 mcg  50 mcg Oral QAC breakfast Audery AmelJohn T Clapacs, MD   50 mcg at 06/06/16 [phone]  . magnesium hydroxide (MILK OF MAGNESIA) suspension 30 mL  30 mL Oral Daily PRN Jonny RuizJohn  T Clapacs, MD      . nicotine (NICODERM CQ - dosed in mg/24 hours) patch 21 mg  21 mg Transdermal Daily Jolanta B [name], MD   21 mg at 06/06/16 0837  . traZODone (DESYREL) tablet 100 mg  100 mg Oral QHS Audery AmelJohn T Clapacs, MD   100 mg at 06/05/16 2126     Discharge Medications: Please see discharge summary for a list of discharge medications.  Relevant Imaging Results:  Relevant Lab Results:   Additional Information    Lynden OxfordKadijah R Orby Tangen, LCSWA
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
## 2016-06-06 NOTE — Progress Notes (Signed)
  [facility] Adult Case Management Discharge Plan :  Will you be returning to the same living situation after discharge:  No. At discharge, do you have transportation home?: Yes,  group home will pick pt up. Do you have the ability to pay for your medications: Yes,  Medicaid.  Release of information consent forms completed and in the chart;  Patient's signature needed at discharge.  Patient to Follow up at: Follow-up Information    [facility]. Go on 06/10/2016.   Why:  Please plan to follow up with [facility] for medication management and therapy. If you have any questions or concerns, feel free to contact ReginaHarvey @ [phone]. Contact information: Address: [address] [place], [place] [address] Phone: [phone] Fax: [phone]          Next level of care provider has access to [facility] Link:no  Safety Planning and Suicide Prevention discussed: Yes,  SPE completed with pt and pt's mother.  Have you used any form of tobacco in the last 30 days? (Cigarettes, Smokeless Tobacco, Cigars, and/or Pipes): Yes  Has patient been referred to the Quitline?: Patient refused referral  Patient has been referred for addiction treatment: Yes  Lynden OxfordKadijah R Kristofer Charles, MSW, LCSW-A 06/06/2016, 2:20 PM

## 2016-06-06 NOTE — Progress Notes (Signed)
Denies SI.  Rates depression as a 6/10.  Verbalizes that he is a little anxious about going to the group home but optimistic.  States "I got to do something different, if this don't work I don't know what I am going to do." Discharge instructions given, verbalized understanding.  Prescriptions given and personal belongings returned.  Escorted off unit by this Clinical research associatewriter to main entrance to travel to group home with group home staff member.

## 2016-06-06 NOTE — Tx Team (Signed)
Interdisciplinary Treatment and Diagnostic Plan Update  06/06/2016 Time of Session: 10:30am Charles Charles MRN: [id number]  Principal Diagnosis: Undifferentiated schizophrenia [facility])  Secondary Diagnoses: Principal Problem:   Undifferentiated schizophrenia (HCC) Active Problems:   Cocaine use disorder, moderate, dependence (HCC)   Adult hypothyroidism   Suicidal ideation   Alcohol use disorder, moderate, dependence (HCC)   GERD (gastroesophageal reflux disease)   Tobacco use disorder   Noncompliance   Cannabis use disorder, moderate, dependence (HCC)   Schizophrenia (HCC)   Current Medications:  Current Facility-Administered Medications  Medication Dose Route Frequency Provider Last Rate Last Dose  . acetaminophen (TYLENOL) tablet 650 mg  650 mg Oral Q6H PRN Audery Amel, MD      . alum & mag hydroxide-simeth (MAALOX/MYLANTA) 200-200-20 MG/5ML suspension 30 mL  30 mL Oral Q4H PRN Audery Amel, MD      . aspirin EC tablet 81 mg  81 mg Oral Daily Audery Amel, MD   81 mg at 06/06/16 0837  . benztropine (COGENTIN) tablet 1 mg  1 mg Oral BID PRN Audery Amel, MD      . famotidine (PEPCID) tablet 20 mg  20 mg Oral BID Audery Amel, MD   20 mg at 06/06/16 0837  . [START ON 07/02/2016] haloperidol decanoate (HALDOL DECANOATE) 100 MG/ML injection 100 mg  100 mg Intramuscular Q30 days Jolanta B [name], MD      . levothyroxine (SYNTHROID, LEVOTHROID) tablet 50 mcg  50 mcg Oral QAC breakfast Audery Amel, MD   50 mcg at 06/06/16 [phone]  . magnesium hydroxide (MILK OF MAGNESIA) suspension 30 mL  30 mL Oral Daily PRN Audery Amel, MD      . nicotine (NICODERM CQ - dosed in mg/24 hours) patch 21 mg  21 mg Transdermal Daily Jolanta B [name], MD   21 mg at 06/06/16 0837  . traZODone (DESYREL) tablet 100 mg  100 mg Oral QHS Audery Amel, MD   100 mg at 06/05/16 2126   PTA Medications: Prescriptions Prior to Admission  Medication Sig Dispense Refill Last Dose  . haloperidol  (HALDOL) 5 MG tablet Take 1 tablet (5 mg total) by mouth at bedtime. 30 tablet 1 Past Week at Unknown time  . naproxen (NAPROSYN) 500 MG tablet Take 500 mg by mouth.   Past Week at Unknown time  . [DISCONTINUED] aspirin 81 MG EC tablet Take 1 tablet (81 mg total) by mouth daily. 30 tablet 1 Past Week at Unknown time  . [DISCONTINUED] benztropine (COGENTIN) 1 MG tablet Take 1 mg by mouth.   Past Week at Unknown time  . [DISCONTINUED] famotidine (PEPCID) 20 MG tablet Take 1 tablet (20 mg total) by mouth 2 (two) times daily. 60 tablet 1 Past Week at Unknown time  . [DISCONTINUED] ibuprofen (ADVIL,MOTRIN) 600 MG tablet Take 1 tablet (600 mg total) by mouth every 6 (six) hours as needed. 30 tablet 0 Past Month at Unknown time  . [DISCONTINUED] levothyroxine (SYNTHROID, LEVOTHROID) 50 MCG tablet Take 1 tablet (50 mcg total) by mouth daily before breakfast. 30 tablet 1 06/03/2016 at Unknown time  . [DISCONTINUED] traZODone (DESYREL) 50 MG tablet Take 1 tablet (50 mg total) by mouth at bedtime. 30 tablet 1 Past Week at Unknown time    Patient Stressors: Substance abuse Other: Loss of housing  Patient Strengths: Average or above average intelligence Physical Health  Treatment Modalities: Medication Management, Group therapy, Case management,  1 to 1 session with clinician, Psychoeducation,  Recreational therapy.   Physician Treatment Plan for Primary Diagnosis: Undifferentiated schizophrenia (HCC) Long Term Goal(s): Improvement in symptoms so as ready for discharge Improvement in symptoms so as ready for discharge   Short Term Goals: Ability to identify changes in lifestyle to reduce recurrence of condition will improve Ability to verbalize feelings will improve Ability to disclose and discuss suicidal ideas Ability to demonstrate self-control will improve Ability to identify and develop effective coping behaviors will improve Ability to maintain clinical measurements within normal limits will  improve Compliance with prescribed medications will improve Ability to identify changes in lifestyle to reduce recurrence of condition will improve Ability to demonstrate self-control will improve Ability to identify triggers associated with substance abuse/mental health issues will improve  Medication Management: Evaluate patient's response, side effects, and tolerance of medication regimen.  Therapeutic Interventions: 1 to 1 sessions, Unit Group sessions and Medication administration.  Evaluation of Outcomes: Progressing  Physician Treatment Plan for Secondary Diagnosis: Principal Problem:   Undifferentiated schizophrenia (HCC) Active Problems:   Cocaine use disorder, moderate, dependence (HCC)   Adult hypothyroidism   Suicidal ideation   Alcohol use disorder, moderate, dependence (HCC)   GERD (gastroesophageal reflux disease)   Tobacco use disorder   Noncompliance   Cannabis use disorder, moderate, dependence (HCC)   Schizophrenia (HCC)  Long Term Goal(s): Improvement in symptoms so as ready for discharge Improvement in symptoms so as ready for discharge   Short Term Goals: Ability to identify changes in lifestyle to reduce recurrence of condition will improve Ability to verbalize feelings will improve Ability to disclose and discuss suicidal ideas Ability to demonstrate self-control will improve Ability to identify and develop effective coping behaviors will improve Ability to maintain clinical measurements within normal limits will improve Compliance with prescribed medications will improve Ability to identify changes in lifestyle to reduce recurrence of condition will improve Ability to demonstrate self-control will improve Ability to identify triggers associated with substance abuse/mental health issues will improve     Medication Management: Evaluate patient's response, side effects, and tolerance of medication regimen.  Therapeutic Interventions: 1 to 1 sessions,  Unit Group sessions and Medication administration.  Evaluation of Outcomes: Progressing   RN Treatment Plan for Primary Diagnosis: Undifferentiated schizophrenia (HCC) Long Term Goal(s): Knowledge of disease and therapeutic regimen to maintain health will improve  Short Term Goals: Ability to demonstrate self-control, Ability to participate in decision making will improve, Ability to identify and develop effective coping behaviors will improve and Compliance with prescribed medications will improve  Medication Management: RN will administer medications as ordered by provider, will assess and evaluate patient's response and provide education to patient for prescribed medication. RN will report any adverse and/or side effects to prescribing provider.  Therapeutic Interventions: 1 on 1 counseling sessions, Psychoeducation, Medication administration, Evaluate responses to treatment, Monitor vital signs and CBGs as ordered, Perform/monitor CIWA, COWS, AIMS and Fall Risk screenings as ordered, Perform wound care treatments as ordered.  Evaluation of Outcomes: Progressing   LCSW Treatment Plan for Primary Diagnosis: Undifferentiated schizophrenia (HCC) Long Term Goal(s): Safe transition to appropriate next level of care at discharge, Engage patient in therapeutic group addressing interpersonal concerns.  Short Term Goals: Engage patient in aftercare planning with referrals and resources, Increase social support, Increase ability to appropriately verbalize feelings, Increase emotional regulation, Facilitate acceptance of mental health diagnosis and concerns, Facilitate patient progression through stages of change regarding substance use diagnoses and concerns, Identify triggers associated with mental health/substance abuse issues and Increase skills for  wellness and recovery  Therapeutic Interventions: Assess for all discharge needs, 1 to 1 time with Social worker, Explore available resources and  support systems, Assess for adequacy in community support network, Educate family and significant other(s) on suicide prevention, Complete Psychosocial Assessment, Interpersonal group therapy.  Evaluation of Outcomes: Progressing   Progress in Treatment: Attending groups: Yes. Participating in groups: Yes. Taking medication as prescribed: Yes Toleration medication: Yes. Family/Significant other contact made: No, will contact:  Pt's granddaughter Patient understands diagnosis: Yes. Discussing patient identified problems/goals with staff: Yes. Medical problems stabilized or resolved: Yes. Denies suicidal/homicidal ideation: Yes. Issues/concerns per patient self-inventory: No. Other: n/a  New problem(s) identified: None identified at this time .   New Short Term/Long Term Goal(s): None identified at this time.   Discharge Plan or Barriers: Patient has interview for group home on 06/06/2016. Patient will follow-up with outpatient provider.   Reason for Continuation of Hospitalization: Depression Medication stabilization  Estimated Length of Stay: 3 to 5 days.   Attendees: Patient: Charles Charles 06/06/2016 1:25 PM  Physician: Dr. Jennet MaduroPucilowska, MD 06/06/2016 1:25 PM  Nursing: Leonia ReaderPhyllis Cobb, RN 06/06/2016 1:25 PM  RN Care Manager: 06/06/2016 1:25 PM  Social Worker: Fredrich BirksAmaris G. Garnette CzechSampson MSW, LCSWA 06/06/2016 1:25 PM  Recreational Therapist: Jacquelynn CreeElizabeth M. Greene, LRT/CTRS 06/06/2016 1:25 PM  Other:  06/06/2016 1:25 PM  Other:  06/06/2016 1:25 PM  Other: 06/06/2016 1:25 PM    Scribe for Treatment Team: Arelia LongestAmaris G Justun Anaya, LCSWA 06/06/2016 1:28 PM

## 2016-06-06 NOTE — [facility] Group Notes (Signed)
[facility] LCSW Group Therapy   06/06/2016 9:30am   Type of Therapy: Group Therapy   Participation Level: Active   Participation Quality: Attentive, Sharing and Supportive   Affect: Appropriate   Cognitive: Alert and Oriented   Insight: Developing/Improving and Engaged   Engagement in Therapy: Developing/Improving and Engaged   Modes of Intervention: Clarification, Confrontation, Discussion, Education, Exploration, Limit-setting, Orientation, Problem-solving, Rapport Building, Dance movement psychotherapisteality Testing, Socialization and Support   Summary of Progress/Problems: The topic for group was balance in life. Today's group focused on defining balance in one's own words, identifying things that can knock one off balance, and exploring healthy ways to maintain balance in life. Group members were asked to provide an example of a time when they felt off balance, describe how they handled that situation, and process healthier ways to regain balance in the future. Group members were asked to share the most important tool for maintaining balance that they learned [facility] and how they plan to apply this method after discharge. Pt defined balance as "stable versus unstable." He identified his family and financial areas as his unbalanced areas of life. He stated in order to achieve balance, he will get a payee to help with finances and move into a group home to repair strained family relationships.  Charles AbbotKadijah Maddisyn Hegwood, MSW, LCSWA 06/06/2016, 11:45AM

## 2016-06-06 NOTE — Progress Notes (Signed)
D: Pt denies SI/HI/AVH. Pt is sad and guarded, affect is flat but cooperative with treatment plan. Pt appears  Anxious, minimal interaction with staff.  A: Pt was offered support and encouragement. Pt was given scheduled medications. Pt was encouraged to attend groups. Q 15 minute checks were done for safety.  R:Pt did not attend group. Pt is taking medication. Pt has no complaints.Pt receptive to treatment and safety maintained on unit.   

## 2016-06-06 NOTE — [facility] Suicide Risk Assessment (Addendum)
[facility] INPATIENT:  Family/Significant Other Suicide Prevention Education  Suicide Prevention Education:  Education Completed; Charles HeysBetty Charles @ ph#: [phone] has been identified by the patient as the family member/significant other with whom the patient will be residing, and identified as the person(s) who will aid the patient in the event of a mental health crisis (suicidal ideations/suicide attempt).  With written consent from the patient, the family member/significant other has been provided the following suicide prevention education, prior to the and/or following the discharge of the patient.  The suicide prevention education provided includes the following:  Suicide risk factors  Suicide prevention and interventions  National Suicide Hotline telephone number  [facility] assessment telephone number  [facility] Emergency Assistance 911  [facility] and/or Residential Mobile Crisis Unit telephone number  Request made of family/significant other to:  Remove weapons (e.g., guns, rifles, knives), all items previously/currently identified as safety concern.    Remove drugs/medications (over-the-counter, prescriptions, illicit drugs), all items previously/currently identified as a safety concern.  The family member/significant other verbalizes understanding of the suicide prevention education information provided.  The family member/significant other agrees to remove the items of safety concern listed above.  Charles OxfordKadijah Charles Charles, MSW, LCSW-A 06/06/2016, 2:21 PM

## 2016-06-06 NOTE — [facility] Suicide Risk Assessment (Signed)
[facility]   Principal Problem: Undifferentiated schizophrenia [facility](HCC) Discharge Diagnoses:  Patient Active Problem List   Diagnosis Date Noted  . Schizophrenia (HCC) [F20.9] 06/04/2016  . Noncompliance [Z91.19] 05/06/2016  . Cannabis use disorder, moderate, dependence (HCC) [F12.20] 05/06/2016  . Chest pain [R07.9] 04/14/2016  . Abdominal pain, epigastric [R10.13] 04/14/2016  . Undifferentiated schizophrenia (HCC) [F20.3] 04/10/2016  . Alcohol use disorder, moderate, dependence (HCC) [F10.20] 04/10/2016  . GERD (gastroesophageal reflux disease) [K21.9] 04/10/2016  . Tobacco use disorder [F17.200] 04/10/2016  . Suicidal ideation [R45.851] 04/09/2016  . Stimulant abuse [F15.10] 12/14/2014  . Cocaine use disorder, moderate, dependence (HCC) [F14.20] 12/17/2013  . Adult hypothyroidism [E03.9] 12/17/2013  . Blurred vision [H53.8] 12/17/2013  . Congenital hiatus hernia [Q40.1] 10/01/2010    Total Time spent with patient: 30 minutes  Musculoskeletal: Strength & Muscle Tone: within normal limits Gait & Station: normal Patient leans: N/A  Psychiatric Specialty Exam: Review of Systems  Psychiatric/Behavioral: Positive for substance abuse.  All other systems reviewed and are negative.   Blood pressure (!) 103/47, pulse 75, temperature 98 F (36.7 C), temperature source Oral, resp. rate 18, height 5\' 2"  (1.575 m), weight 73 kg (161 lb), SpO2 100 %.Body mass index is 29.45 kg/m.  General Appearance: Casual  Eye Contact::  Good  Speech:  Clear and Coherent409  Volume:  Normal  Mood:  Euthymic  Affect:  Appropriate  Thought Process:  Goal Directed and Descriptions of Associations: Intact  Orientation:  Full (Time, Place, and Person)  Thought Content:  WDL  Suicidal Thoughts:  No  Homicidal Thoughts:  No  Memory:  Immediate;   Fair Recent;   Fair Remote;   Fair  Judgement:  Impaired  Insight:  Shallow  Psychomotor Activity:  Normal  Concentration:   Fair  Recall:  [employer]Fair  Fund of Knowledge:Fair  Language: Fair  Akathisia:  No  Handed:  Right  AIMS (if indicated):     Assets:  Communication Skills Desire for Improvement Financial Resources/Insurance Physical Health Resilience Social Support  Sleep:  Number of Hours: 7.75  Cognition: WNL  ADL's:  Intact   Mental Status Per Nursing Assessment::   On Admission:  Self-harm thoughts  Demographic Factors:  Male  Loss Factors: NA  Historical Factors: Prior suicide attempts and Impulsivity  Risk Reduction Factors:   Sense of responsibility to family and Positive social support  Continued Clinical Symptoms:  Alcohol/Substance Abuse/Dependencies Schizophrenia:   Paranoid or undifferentiated type  Cognitive Features That Contribute To Risk:  None    Suicide Risk:  Minimal: No identifiable suicidal ideation.  Patients presenting with no risk factors but with morbid ruminations; may be classified as minimal risk based on the severity of the depressive symptoms    Plan Of Care/Follow-up recommendations:  Activity:  As tolerated. Diet:  Low sodium heart healthy. Other:  Keep follow-up appointments  Kristine LineaJolanta Elleana Stillson, MD 06/06/2016, 11:55 AM

## 2016-06-20 ENCOUNTER — Ambulatory Visit (INDEPENDENT_AMBULATORY_CARE_PROVIDER_SITE_OTHER): Payer: Medicaid Other | Admitting: Surgery

## 2016-06-20 ENCOUNTER — Encounter: Payer: Self-pay | Admitting: Surgery

## 2016-06-20 VITALS — BP 105/72 | HR 66 | Temp 98.3°F | Wt 164.0 lb

## 2016-06-20 DIAGNOSIS — K611 Rectal abscess: Secondary | ICD-10-CM

## 2016-06-20 NOTE — Patient Instructions (Signed)
Please try to buy some Depends for men so it could keep your area dry.  Or, you could place a pad so the area stays dry.  Please start taking a Multivitamin one capsule/tablet a day.

## 2016-06-20 NOTE — Progress Notes (Signed)
55 year old male who is well-known to the surgery service with the previous anal fissure and perianal abscess that is still in the healing process. Patient still states he's having some pain and bleeding in the area. Patient is keeping the area clean but does state he still has some drainage that comes off on his boxers. He is no longer living with his mother but is in a group home. He states that he has been free from drugs and alcohol for about a month and a half. 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 He overall is doing much better with a little less pain and less drainage. He states that he's been having good bowel movements every day without any hardness.  Vitals:   06/20/16 0906  BP: 105/72  Pulse: 66  Temp: 98.3 F (36.8 C)   PE:  Gen: NAD Res: CTAB/L  Cardio: RRR Abd: Soft, nt, nd Rectal exam: 1 centimeter area of healing granulation tissue just to the left of the anus  A/P: 55 year old male with substance abuse on a nonhealing perirectal abscess. It seems to be healing some from the last time I saw him a few weeks previously. He is to continue to keep his stools soft and has been drinking much more water. He is also instructed to keep the area clean and to try and put something in his boxers to help catch the drainage. I'll have him return in 4 weeks to see if the area has resolved.

## 2016-07-22 ENCOUNTER — Telehealth: Payer: Self-pay

## 2016-07-22 NOTE — Telephone Encounter (Signed)
Patient's mother called in at this time to cancel patient's appointment. Patient is currently incarcerated. Appointment was not rescheduled.

## 2016-07-25 ENCOUNTER — Ambulatory Visit: Payer: Medicaid Other | Admitting: Surgery

## 2017-02-23 DIAGNOSIS — I639 Cerebral infarction, unspecified: Secondary | ICD-10-CM | POA: Insufficient documentation

## 2017-08-14 IMAGING — CR DG ABDOMEN 2V
1 series · 3 of 3 positions shown · non-contrast
Comparison: Chest in two views abdomen 01/17/2014.

CLINICAL DATA: Abdominal pain.  Constipation for 5 days.

EXAM:
ABDOMEN - 2 VIEW

[Series 1: dg abd 2 views · 0.14mm/px · 3 of 3 slices shown]
[im 1/3]
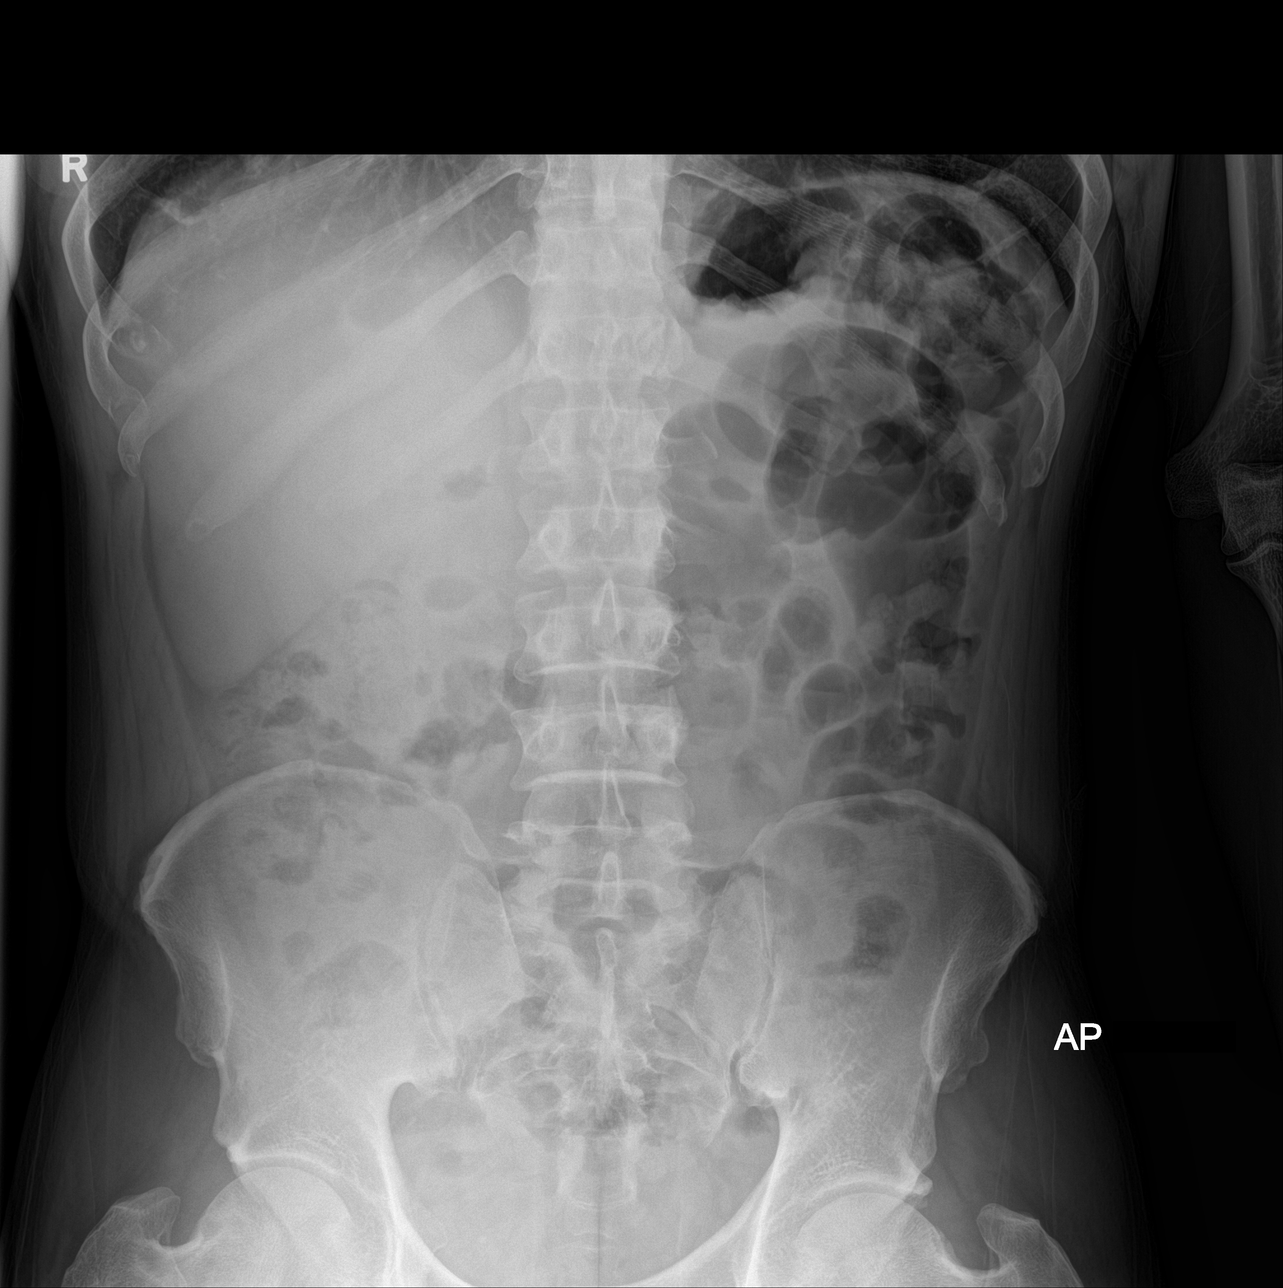
[im 2/3]
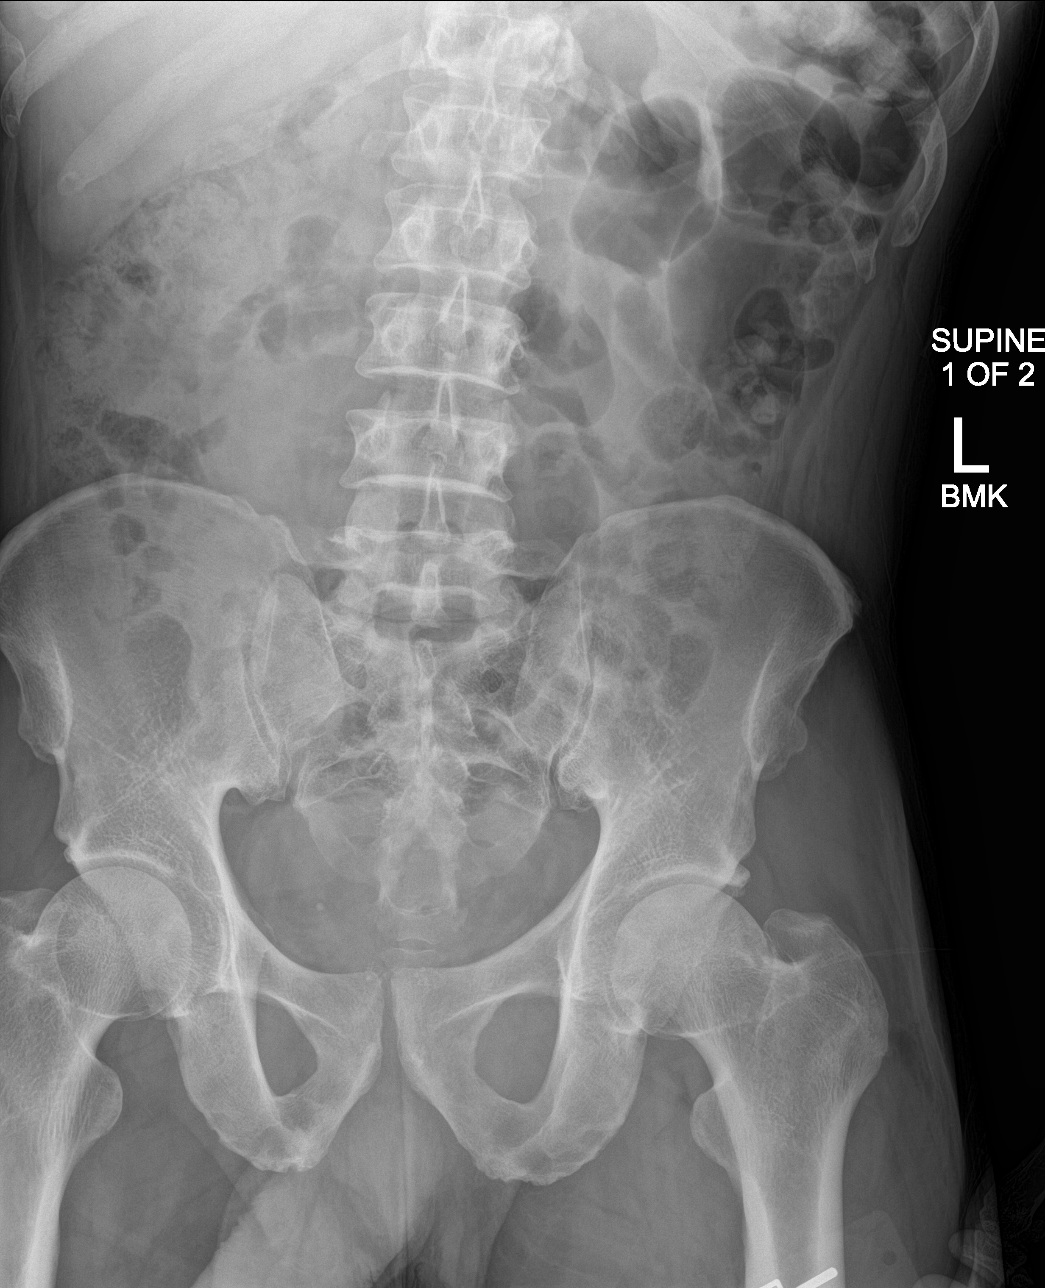
[im 3/3]
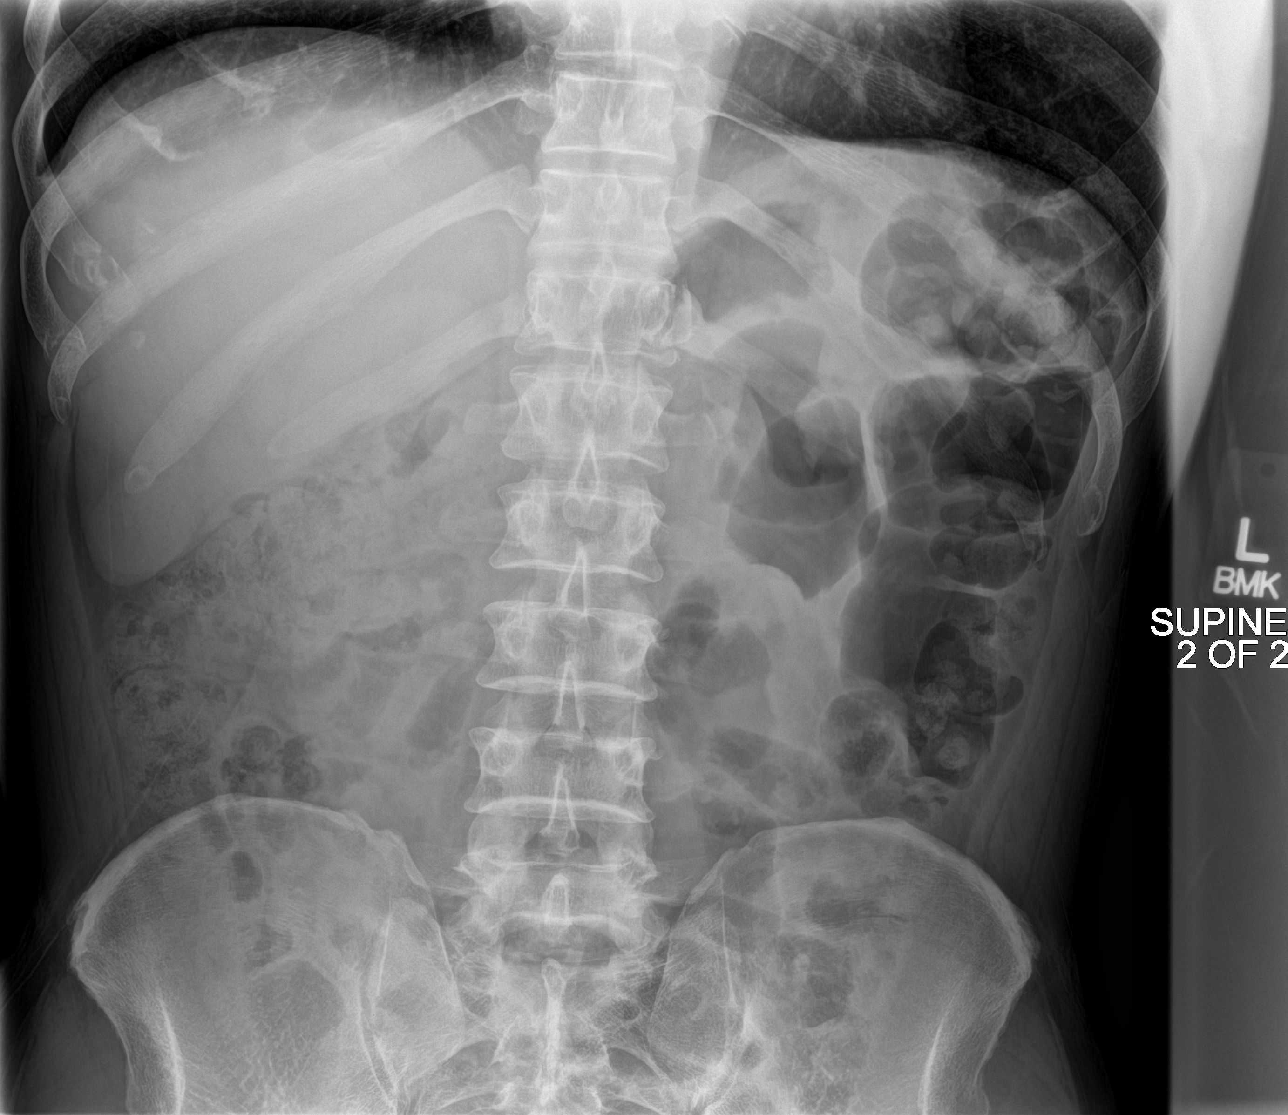

[3 of 3 positions shown; findings below may reference images not displayed]

FINDINGS: The bowel gas pattern is nonobstructive. There is no free
intraperitoneal air. A large volume of stool is present throughout
the colon, particularly the right colon.
IMPRESSION: No acute abnormality.

Large colonic stool burden most notable in the right colon.

## 2017-08-15 IMAGING — CT CT ABD-PELV W/ CM
1 of 4 series · 13 of 32 positions shown, 18 images · IV contrast (omnipaque)
Comparison: Abdominal series 99392 and earlier.

CLINICAL DATA: 54-year-old male with right side abdominal pain and
bloating for 3 days. Recent fecal impaction and difficulty passing
stool. Initial encounter.

EXAM:
CT ABDOMEN AND PELVIS WITH CONTRAST
TECHNIQUE: Multidetector CT imaging of the abdomen and pelvis was performed
using the standard protocol following bolus administration of
intravenous contrast.
CONTRAST:  100 mL Omnipaque 300

[Series 2: routine abd pel with · axial · 0.62mm/px · z∈[-792,-397]mm · 13 of 89 slices shown, 18 images]
[im 5/89  soft-tissue]
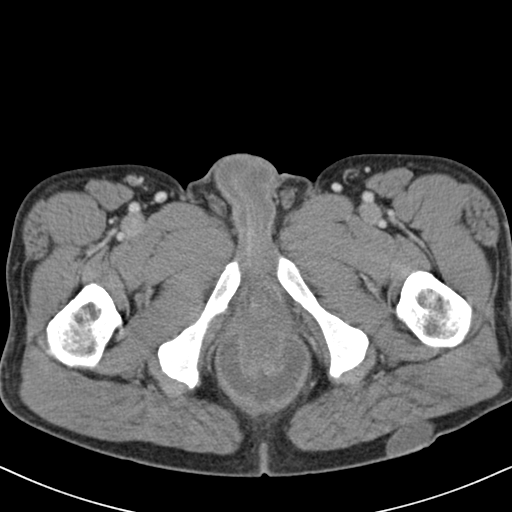
[im 5/89  bone]
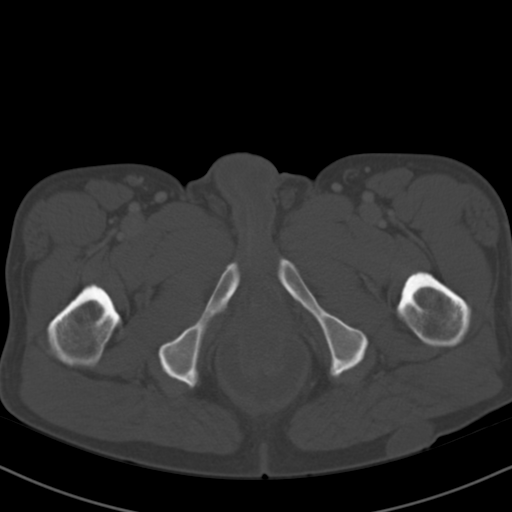
[im 15/89  soft-tissue]
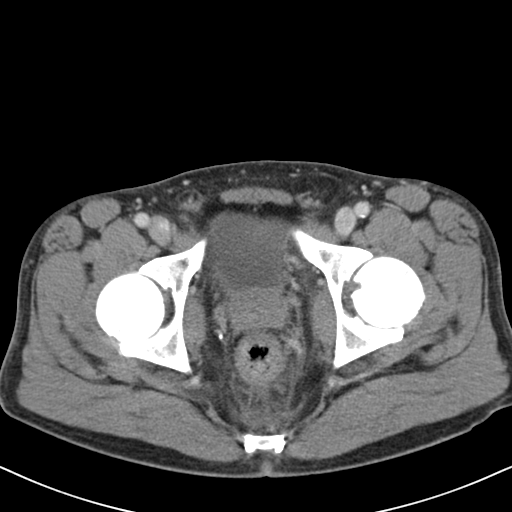
[im 20/89  soft-tissue]
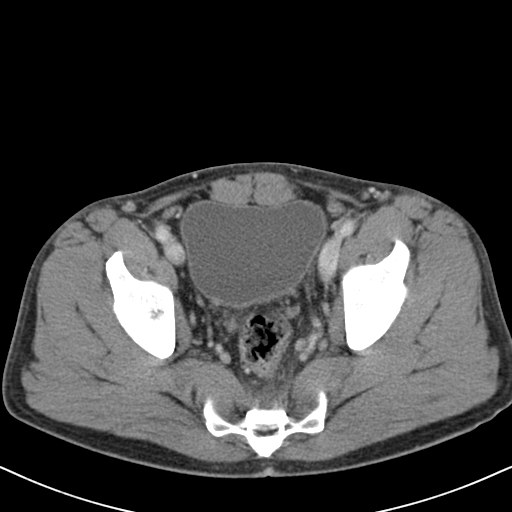
[im 25/89  soft-tissue]
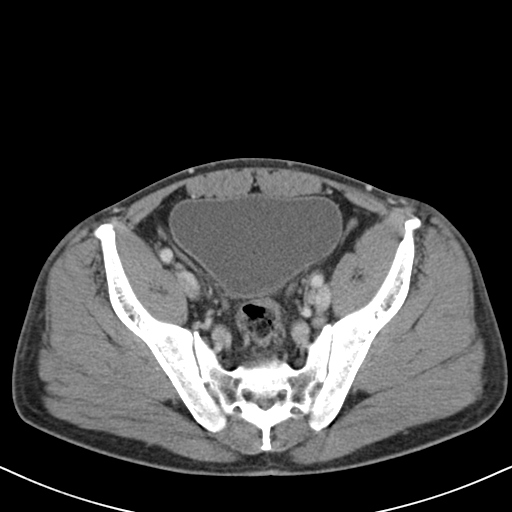
[im 35/89  soft-tissue]
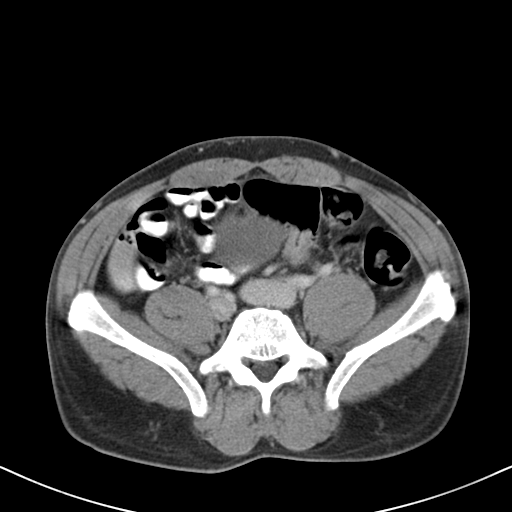
[im 40/89  soft-tissue]
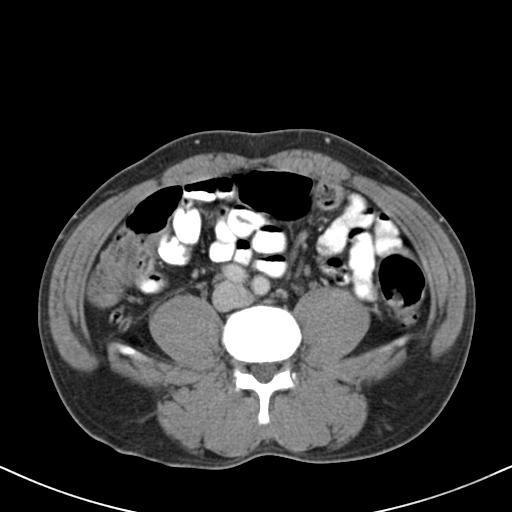
[im 49/89  soft-tissue]
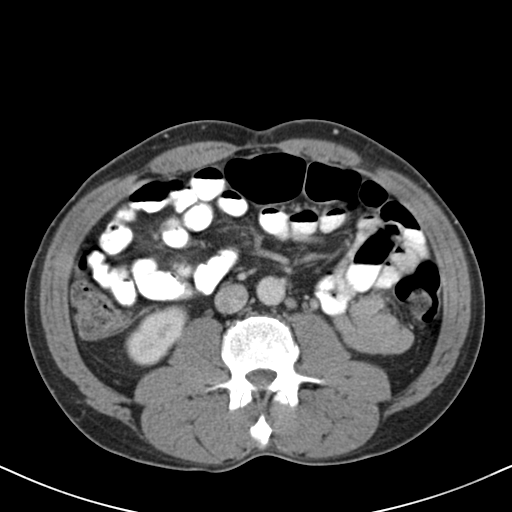
[im 54/89  soft-tissue]
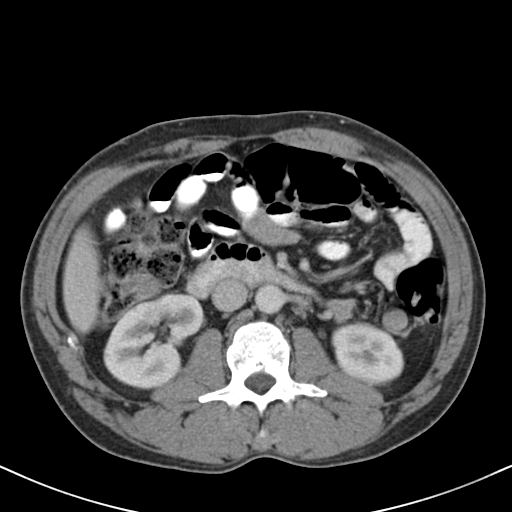
[im 64/89  soft-tissue]
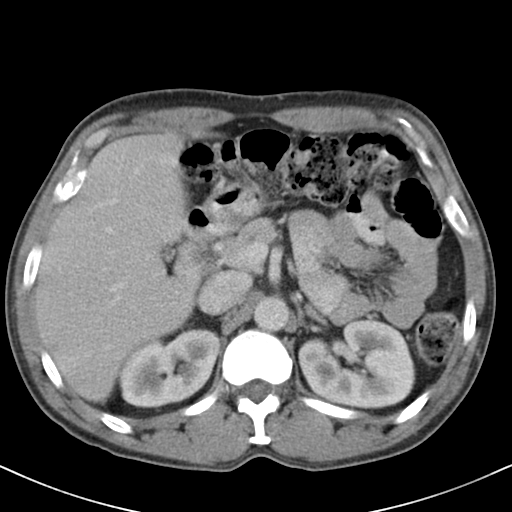
[im 64/89  bone]
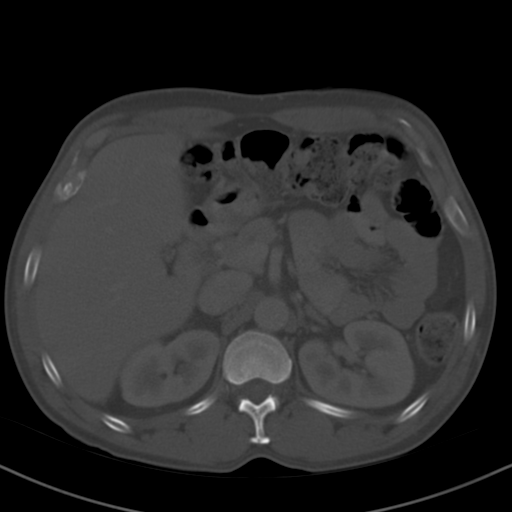
[im 69/89  soft-tissue]
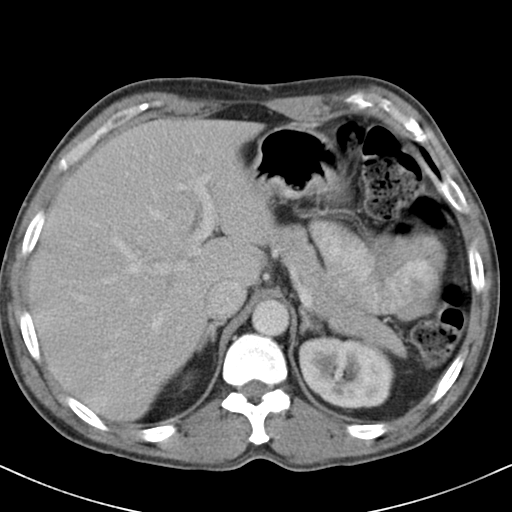
[im 69/89  lung]
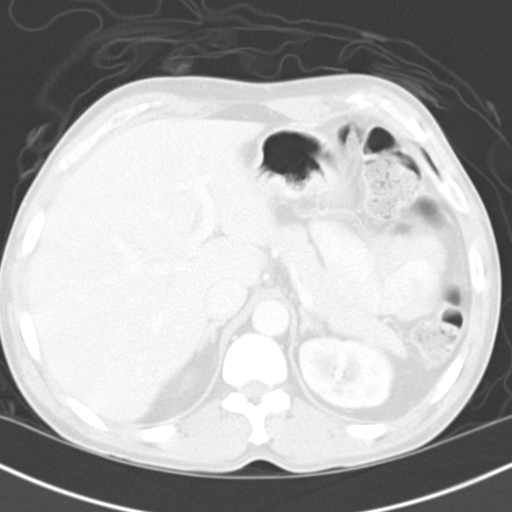
[im 74/89  soft-tissue]
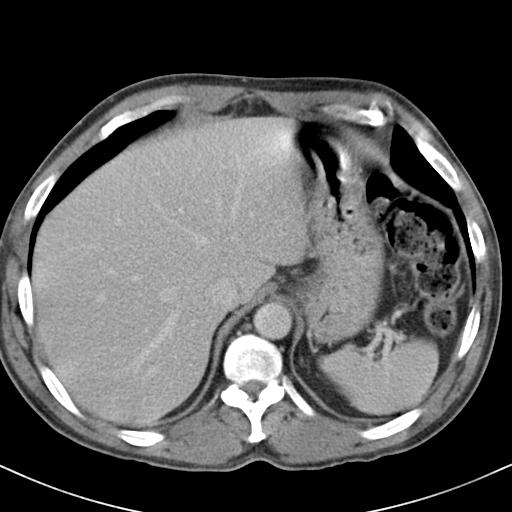
[im 74/89  lung]
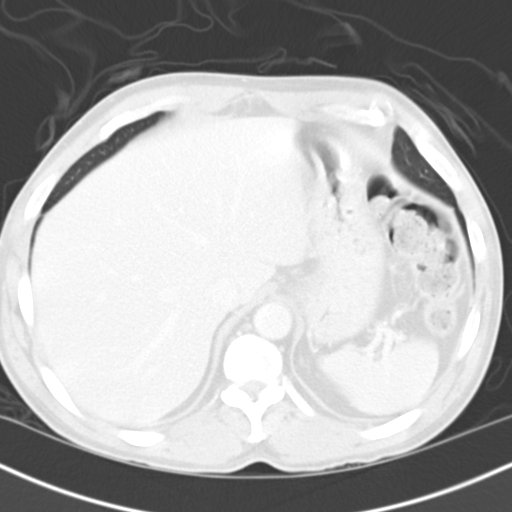
[im 79/89  lung]
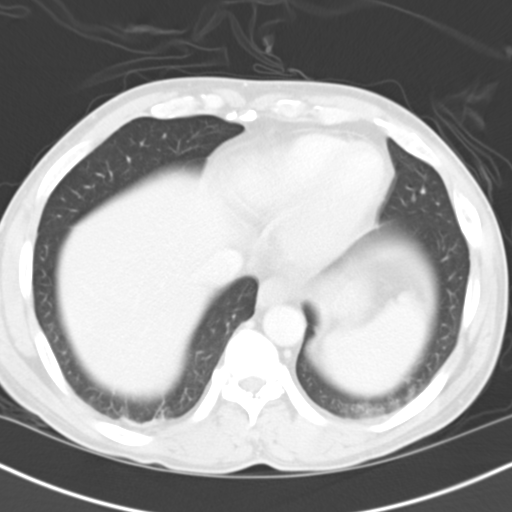
[im 84/89  soft-tissue]
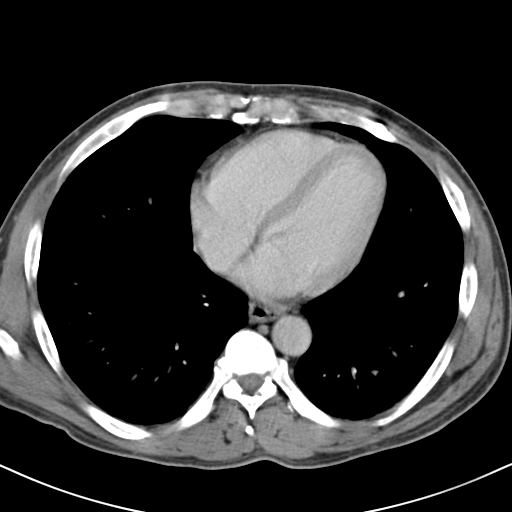
[im 84/89  lung]
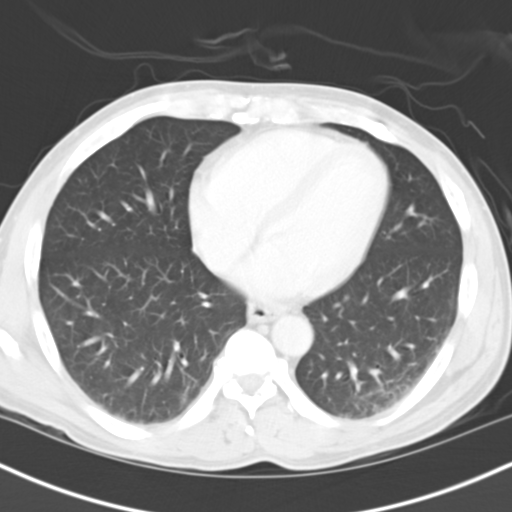

[13 of 32 positions shown; findings below may reference images not displayed]

FINDINGS: Mild dependent atelectasis or scarring at the lung bases. No
pericardial or pleural effusion.

Chronic left lateral seventh rib fracture. No acute osseous
abnormality identified.

Mild presacral stranding with no free fluid in the pelvis. There is
a rim enhancing crescent-shaped perianal fluid collection, occupying
the posterior and lateral aspects of the pelvic floor (series 2,
images 81-88 and series 4 image 4. This encompasses 49 x 60 x 37 mm
(AP by transverse by CC). Mild to moderate distal rectum
circumferential wall thickening (series 2, image 74). There may be a
fistulous connection along the anterior extent (6 o'clock position
of the lumen - on series 2, image 82), uncertain. There is no
extension to the gluteal cleft. There is surrounding perineum soft
tissue stranding. The lesion is in proximity to the coccyx which
appears intact (sagittal image 83).

Unrelated posterior left thigh probable sebaceous cyst on series 2,
image 85.

No inguinal lymphadenopathy. The proximal rectum appears normal.
Negative urinary bladder. Redundant sigmoid colon partially
distended with gas and stool. Similar mild gas and stool distension
of the left colon and transverse colon. Decompressed right colon.
Normal appendix. Oral contrast has just reached the cecum. No
dilated or abnormal small bowel loops. Decompressed stomach.

No abdominal free air or free fluid. Liver, gallbladder, spleen,
pancreas, and adrenal glands. Portal venous system is patent. Major
arterial structures in the abdomen and pelvis appear normal. Mild
left common femoral artery calcified atherosclerosis. Bilateral
renal enhancement and contrast excretion within normal limits. Small
left renal lower pole benign appearing cortical cyst. No
lymphadenopathy in the abdomen or pelvis.
IMPRESSION: 1. Crescent-shaped posteriorly situated but nearly circumferential
perianal abscess encompassing 4.9 x 6.0 x 3.7 cm. Associated mild
presacral stranding and thickening of the distal rectum.
2. Otherwise negative abdomen and pelvis.

## 2019-12-23 ENCOUNTER — Emergency Department
Admission: EM | Admit: 2019-12-23 | Discharge: 2019-12-23 | Disposition: A | Discharge status: Home / Self Care | Payer: Medicaid Other | Attending: Student in an Organized Health Care Education/Training Program | Admitting: Student in an Organized Health Care Education/Training Program

## 2019-12-23 ENCOUNTER — Other Ambulatory Visit: Payer: Self-pay

## 2019-12-23 ENCOUNTER — Encounter: Payer: Self-pay | Admitting: Emergency Medicine

## 2019-12-23 ENCOUNTER — Emergency Department: Payer: Medicaid Other

## 2019-12-23 DIAGNOSIS — F17210 Nicotine dependence, cigarettes, uncomplicated: Secondary | ICD-10-CM | POA: Insufficient documentation

## 2019-12-23 DIAGNOSIS — R109 Unspecified abdominal pain: Secondary | ICD-10-CM | POA: Diagnosis present

## 2019-12-23 DIAGNOSIS — K5732 Diverticulitis of large intestine without perforation or abscess without bleeding: Secondary | ICD-10-CM | POA: Diagnosis not present

## 2019-12-23 LAB — BASIC METABOLIC PANEL
Anion gap: 12 (ref 5–15)
BUN: 13 mg/dL (ref 6–20)
CO2: 26 mmol/L (ref 22–32)
Calcium: 9.7 mg/dL (ref 8.9–10.3)
Chloride: 99 mmol/L (ref 98–111)
Creatinine, Ser: 0.99 mg/dL (ref 0.61–1.24)
GFR calc Af Amer: >60 mL/min (ref >60)
GFR calc non Af Amer: >60 mL/min (ref >60)
Glucose, Bld: 89 mg/dL (ref 70–99)
Potassium: 3.6 mmol/L (ref 3.5–5.1)
Sodium: 137 mmol/L (ref 135–145)

## 2019-12-23 LAB — CBC
HCT: 42.5 % (ref 39.0–52.0)
Hemoglobin: 14.9 g/dL (ref 13.0–17.0)
MCH: 33.2 pg (ref 26.0–34.0)
MCHC: 35.1 g/dL (ref 30.0–36.0)
MCV: 94.7 fL (ref 80.0–100.0)
Platelets: 355 10*3/uL (ref 150–400)
RBC: 4.49 MIL/uL (ref 4.22–5.81)
RDW: 13 % (ref 11.5–15.5)
WBC: 7.9 10*3/uL (ref 4.0–10.5)
nRBC: 0 % (ref 0.0–0.2)

## 2019-12-23 LAB — URINALYSIS, COMPLETE (UACMP) WITH MICROSCOPIC
Bacteria, UA: NONE SEEN
Bilirubin Urine: NEGATIVE
Glucose, UA: NEGATIVE mg/dL
Ketones, ur: NEGATIVE mg/dL
Leukocytes,Ua: NEGATIVE
Nitrite: NEGATIVE
Protein, ur: NEGATIVE mg/dL
Specific Gravity, Urine: 1.012 (ref 1.005–1.030)
pH: 6 (ref 5.0–8.0)

## 2019-12-23 MED ORDER — SODIUM CHLORIDE 0.9 % IV BOLUS
500.0000 mL | Freq: Once | INTRAVENOUS | Status: AC
  Administered 2019-12-23: 500 mL via INTRAVENOUS

## 2019-12-23 MED ORDER — ONDANSETRON HCL 4 MG/2ML IJ SOLN
4.0000 mg | Freq: Once | INTRAMUSCULAR | Status: AC
  Administered 2019-12-23: 4 mg via INTRAVENOUS
  Filled 2019-12-23: qty 2

## 2019-12-23 MED ORDER — METRONIDAZOLE 500 MG PO TABS: 500.0000 mg | ORAL_TABLET | Freq: Three times a day (TID) | ORAL | 0 refills | Status: AC

## 2019-12-23 MED ORDER — METRONIDAZOLE 500 MG PO TABS
500.0000 mg | ORAL_TABLET | Freq: Once | ORAL | Status: AC
  Administered 2019-12-23: 500 mg via ORAL
  Filled 2019-12-23: qty 1

## 2019-12-23 MED ORDER — CIPROFLOXACIN HCL 500 MG PO TABS: 500.0000 mg | ORAL_TABLET | Freq: Two times a day (BID) | ORAL | 0 refills | Status: AC

## 2019-12-23 MED ORDER — MORPHINE SULFATE (PF) 4 MG/ML IV SOLN
4.0000 mg | INTRAVENOUS | Status: DC | PRN
  Administered 2019-12-23: 4 mg via INTRAVENOUS
  Filled 2019-12-23: qty 1

## 2019-12-23 MED ORDER — CIPROFLOXACIN HCL 500 MG PO TABS
500.0000 mg | ORAL_TABLET | Freq: Once | ORAL | Status: AC
  Administered 2019-12-23: 500 mg via ORAL
  Filled 2019-12-23: qty 1

## 2019-12-23 NOTE — ED Notes (Signed)
Pt states that he has head a headache for 3 weeks with pain 8/10 and right side pain that has been going on for 3 days. Patient denies n/v/d, LBM yesterday, no fevers or cough. Pt denies difficulty urinating or pain while urinating.

## 2019-12-23 NOTE — ED Notes (Signed)
Pt given crackers and pb after okay'd with Dr. Roxan Hockey.

## 2019-12-23 NOTE — ED Provider Notes (Signed)
[facility] Emergency Department Provider Note    First MD Initiated Contact with Patient 12/23/19 1442     (approximate)  I have reviewed the triage vital signs and the nursing notes.   HISTORY  Chief Complaint Flank Pain and Headache    HPI Charles Charles is a 59 y.o. male with the below listed past medical history presents to the ER for several days of left-sided abdominal pain and flank pain as well as a headache for the past 3 weeks.  Has a history of similar headaches.  Is not sudden in onset.  No numbness or tingling.  Denies any history of kidney stones or diverticulitis.  No measured fevers.  No nausea or vomiting but has had decreased appetite secondary to the pain.    Past Medical History:  Diagnosis Date  . GERD (gastroesophageal reflux disease)   . Headache   . Schizo-affective schizophrenia (HCC)    Family History  Problem Relation Age of Onset  . Anxiety disorder Mother   . Diabetes Mother   . Diabetes Father    Past Surgical History:  Procedure Laterality Date  . COLONOSCOPY WITH PROPOFOL N/A 08/04/2015   Procedure: COLONOSCOPY WITH PROPOFOL;  Surgeon: Midge Minium, MD;  Location: [facility];  Service: Endoscopy;  Laterality: N/A;  . INCISION AND DRAINAGE PERIRECTAL ABSCESS N/A 07/14/2015   Procedure: IRRIGATION AND DEBRIDEMENT PERIRECTAL ABSCESS;  Surgeon: Lattie Haw, MD;  Location: [facility];  Service: General;  Laterality: N/A;  . INCISION AND DRAINAGE PERIRECTAL ABSCESS N/A 08/14/2015   Procedure: IRRIGATION AND DEBRIDEMENT PERIRECTAL ABSCESS;  Surgeon: Gladis Riffle, MD;  Location: [facility] OR[facility];  Service: General;  Laterality: N/A;   Patient Active Problem List   Diagnosis Date Noted  . Schizophrenia (HCC) 06/04/2016  . Noncompliance 05/06/2016  . Cannabis use disorder, moderate, dependence (HCC) 05/06/2016  . Chest pain 04/14/2016  . Abdominal pain, epigastric 04/14/2016  . Undifferentiated schizophrenia (HCC)  04/10/2016  . Alcohol use disorder, moderate, dependence (HCC) 04/10/2016  . GERD (gastroesophageal reflux disease) 04/10/2016  . Tobacco use disorder 04/10/2016  . Suicidal ideation 04/09/2016  . Stimulant abuse (HCC) 12/14/2014  . Cocaine use disorder, moderate, dependence (HCC) 12/17/2013  . Adult hypothyroidism 12/17/2013  . Blurred vision 12/17/2013  . Congenital hiatus hernia 10/01/2010      Prior to Admission medications   Medication Sig Start Date End Date Taking? Authorizing Provider  aspirin 81 MG EC tablet Take 1 tablet (81 mg total) by mouth daily. 06/06/16   [name], Jolanta B, MD  benztropine (COGENTIN) 1 MG tablet Take 1 tablet (1 mg total) by mouth daily. 06/06/16 06/06/17  [name], Ellin Goodie, MD  ciprofloxacin (CIPRO) 500 MG tablet Take 1 tablet (500 mg total) by mouth 2 (two) times daily for 7 days. 12/23/19 12/30/19  Willy Eddy, MD  famotidine (PEPCID) 20 MG tablet Take 1 tablet (20 mg total) by mouth 2 (two) times daily. 06/06/16   [name], Jolanta B, MD  haloperidol decanoate (HALDOL DECANOATE) 100 MG/ML injection Inject 1 mL (100 mg total) into the muscle every 30 (thirty) days. 07/02/16   [name], Braulio Conte B, MD  ibuprofen (ADVIL,MOTRIN) 600 MG tablet Take 1 tablet (600 mg total) by mouth every 6 (six) hours as needed. 06/06/16   [name], Braulio Conte B, MD  levothyroxine (SYNTHROID, LEVOTHROID) 50 MCG tablet Take 1 tablet (50 mcg total) by mouth daily before breakfast. 06/06/16   [name], Jolanta B, MD  metroNIDAZOLE (FLAGYL) 500 MG tablet Take 1  tablet (500 mg total) by mouth 3 (three) times daily for 7 days. 12/23/19 12/30/19  Willy Eddy, MD  traZODone (DESYREL) 50 MG tablet Take 1 tablet (50 mg total) by mouth at bedtime. 06/06/16   [name], Ellin Goodie, MD    Allergies Penicillins and Sulfa antibiotics    Social History Social History   Tobacco Use  . Smoking status: Current Every Day Smoker    Packs/day: 2.00    Types: Cigarettes  .  Smokeless tobacco: Never Used  . Tobacco comment: (1-2 cigs/day)  Substance Use Topics  . Alcohol use: Yes    Alcohol/week: 56.0 standard drinks    Types: 56 Cans of beer per week    Comment: 2 x 40's daily - pt says no alcohol for several weeks  . Drug use: Yes    Types: Cocaine, Marijuana    Comment: back in the days    Review of Systems Patient denies headaches, rhinorrhea, blurry vision, numbness, shortness of breath, chest pain, edema, cough, abdominal pain, nausea, vomiting, diarrhea, dysuria, fevers, rashes or hallucinations unless otherwise stated above in HPI. ____________________________________________   PHYSICAL EXAM:  VITAL SIGNS: Vitals:   12/23/19 0922 12/23/19 1401  BP: (!) 99/49 108/63  Pulse: 85 80  Resp: 18 16  Temp: 98 F (36.7 C) 98.7 F (37.1 C)  SpO2: 97% 97%    Constitutional: Alert and oriented.  Eyes: Conjunctivae are normal.  Head: Atraumatic. Nose: No congestion/rhinnorhea. Mouth/Throat: Mucous membranes are moist.   Neck: No stridor. Painless ROM.  Cardiovascular: Normal rate, regular rhythm. Grossly normal heart sounds.  Good peripheral circulation. Respiratory: Normal respiratory effort.  No retractions. Lungs CTAB. Gastrointestinal: Soft with ttp on left abdomen, no guarding or rebound. No distention. No abdominal bruits. No CVA tenderness. Genitourinary:  Musculoskeletal: No lower extremity tenderness nor edema.  No joint effusions. Neurologic:  Normal speech and language. No gross focal neurologic deficits are appreciated. No facial droop Skin:  Skin is warm, dry and intact. No rash noted. Psychiatric: Mood and affect are normal. Speech and behavior are normal.  ____________________________________________   LABS (all labs ordered are listed, but only abnormal results are displayed)  Results for orders placed or performed during the hospital encounter of 12/23/19 (from the past 24 hour(s))  Urinalysis, Complete w Microscopic      Status: Abnormal   Collection Time: 12/23/19  9:23 AM  Result Value Ref Range   Color, Urine YELLOW (A) YELLOW   APPearance HAZY (A) CLEAR   Specific Gravity, Urine 1.012 1.005 - 1.030   pH 6.0 5.0 - 8.0   Glucose, UA NEGATIVE NEGATIVE mg/dL   Hgb urine dipstick MODERATE (A) NEGATIVE   Bilirubin Urine NEGATIVE NEGATIVE   Ketones, ur NEGATIVE NEGATIVE mg/dL   Protein, ur NEGATIVE NEGATIVE mg/dL   Nitrite NEGATIVE NEGATIVE   Leukocytes,Ua NEGATIVE NEGATIVE   RBC / HPF 6-10 0 - 5 RBC/hpf   WBC, UA 0-5 0 - 5 WBC/hpf   Bacteria, UA NONE SEEN NONE SEEN   Squamous Epithelial / LPF 0-5 0 - 5   Mucus PRESENT   CBC     Status: None   Collection Time: 12/23/19  9:23 AM  Result Value Ref Range   WBC 7.9 4.0 - 10.5 K/uL   RBC 4.49 4.22 - 5.81 MIL/uL   Hemoglobin 14.9 13.0 - 17.0 g/dL   HCT 52.8 39 - 52 %   MCV 94.7 80.0 - 100.0 fL   MCH 33.2 26.0 - 34.0 pg  MCHC 35.1 30.0 - 36.0 g/dL   RDW 93.8 18.2 - 99.3 %   Platelets 355 150 - 400 K/uL   nRBC 0.0 0.0 - 0.2 %  Basic metabolic panel     Status: None   Collection Time: 12/23/19  9:23 AM  Result Value Ref Range   Sodium 137 135 - 145 mmol/L   Potassium 3.6 3.5 - 5.1 mmol/L   Chloride 99 98 - 111 mmol/L   CO2 26 22 - 32 mmol/L   Glucose, Bld 89 70 - 99 mg/dL   BUN 13 6 - 20 mg/dL   Creatinine, Ser 7.16 0.61 - 1.24 mg/dL   Calcium 9.7 8.9 - 96.7 mg/dL   GFR calc non Af Amer >60 >60 mL/min   GFR calc Af Amer >60 >60 mL/min   Anion gap 12 5 - 15   ____________________________________________  EKG My review and personal interpretation at Time: 9:26   Indication: abd pain  Rate: 75  Rhythm: sinus Axis: normal Other: normal intervals, nonspecific t wave abn, no stemi ____________________________________________  RADIOLOGY  I personally reviewed all radiographic images ordered to evaluate for the above acute complaints and reviewed radiology reports and findings.  These findings were personally discussed with the patient.  Please  see medical record for radiology report.  ____________________________________________   PROCEDURES  Procedure(s) performed:  Procedures    Critical Care performed: no ____________________________________________   INITIAL IMPRESSION / ASSESSMENT AND PLAN / ED COURSE  Pertinent labs & imaging results that were available during my care of the patient were reviewed by me and considered in my medical decision making (see chart for details).   DDX: Stone, colitis, diverticulitis, AAA, enteritis, Pilo, CVA, mass, tension, migraine, chronic daily headache  Charles Charles is a 59 y.o. who presents to the ED with symptoms as described above.  Patient nontoxic-appearing.  Neuro exam is reassuring nonfocal.  Given his location of pain will order CT imaging.  No white count urinalysis does have a trace hemoglobin CT imaging does not show any evidence of stone no acute intracranial abnormality but does show evidence of acute uncomplicated diverticulitis for which we will give antibiotics.  Patient given IV fluids as well as IV pain medication antiemetic.  He is tolerating p.o.  Will be stable and appropriate for outpatient follow-up.     The patient was evaluated in Emergency Department today for the symptoms described in the history of present illness. He/she was evaluated in the context of the global COVID-19 pandemic, which necessitated consideration that the patient might be at risk for infection with the SARS-CoV-2 virus that causes COVID-19. Institutional protocols and algorithms that pertain to the evaluation of patients at risk for COVID-19 are in a state of rapid change based on information released by regulatory bodies including the CDC and federal and state organizations. These policies and algorithms were followed during the patient's care in the ED.  As part of my medical decision making, I reviewed the following data within the electronic MEDICAL RECORD NUMBER Nursing notes reviewed and  incorporated, Labs reviewed, notes from prior ED visits and [place] Controlled Substance Database   ____________________________________________   FINAL CLINICAL IMPRESSION(S) / ED DIAGNOSES  Final diagnoses:  Diverticulitis of large intestine without perforation or abscess without bleeding      NEW MEDICATIONS STARTED DURING THIS VISIT:  New Prescriptions   CIPROFLOXACIN (CIPRO) 500 MG TABLET    Take 1 tablet (500 mg total) by mouth 2 (two) times daily for 7  days.   METRONIDAZOLE (FLAGYL) 500 MG TABLET    Take 1 tablet (500 mg total) by mouth 3 (three) times daily for 7 days.     Note:  This document was prepared using Dragon voice recognition software and may include unintentional dictation errors.    Merlyn Lot, MD 12/23/19 [phone]

## 2019-12-23 NOTE — ED Triage Notes (Signed)
Patient reports headache for approximately 4 weeks. Also reports left flank pain that started Monday. Denies N/V/D.

## 2020-02-21 ENCOUNTER — Other Ambulatory Visit: Payer: Self-pay

## 2020-02-21 ENCOUNTER — Ambulatory Visit (LOCAL_COMMUNITY_HEALTH_CENTER): Payer: Self-pay

## 2020-02-21 DIAGNOSIS — Z111 Encounter for screening for respiratory tuberculosis: Secondary | ICD-10-CM

## 2020-02-21 NOTE — Progress Notes (Signed)
Per pt, Here for PPD as recommended by his dr. Bernestine Amass). C/o SOB, fatigue, night sweats. Reports he is receiving medical care from [facility] ([place] r/t HIV (which was dx either 2018 or 2019 per pt) and is on meds for this. Has PPDR reminder. Jerel Shepherd, RN

## 2020-02-24 ENCOUNTER — Other Ambulatory Visit: Payer: Self-pay | Admitting: Family Medicine

## 2020-02-24 ENCOUNTER — Ambulatory Visit
Admission: RE | Admit: 2020-02-24 | Discharge: 2020-02-24 | Disposition: A | Discharge status: Home / Self Care | Payer: Self-pay | Source: Ambulatory Visit | Attending: Family Medicine | Admitting: Family Medicine

## 2020-02-24 ENCOUNTER — Other Ambulatory Visit: Payer: Self-pay

## 2020-02-24 ENCOUNTER — Ambulatory Visit (LOCAL_COMMUNITY_HEALTH_CENTER): Payer: Medicaid Other

## 2020-02-24 ENCOUNTER — Ambulatory Visit
Admission: RE | Admit: 2020-02-24 | Discharge: 2020-02-24 | Disposition: A | Discharge status: Home / Self Care | Payer: Self-pay | Attending: Family Medicine | Admitting: Family Medicine

## 2020-02-24 VITALS — Wt 177.0 lb

## 2020-02-24 DIAGNOSIS — R7612 Nonspecific reaction to cell mediated immunity measurement of gamma interferon antigen response without active tuberculosis: Secondary | ICD-10-CM | POA: Insufficient documentation

## 2020-02-24 DIAGNOSIS — Z111 Encounter for screening for respiratory tuberculosis: Secondary | ICD-10-CM

## 2020-02-24 NOTE — Progress Notes (Signed)
Patient referred from [facility] for hx of +PPD in 2018.  PPD today 70mm. Patient reports thinks he had a +PPD while incarcerated years ago.  PCP also reported via phone that patient had +QFT in 2021 and is HIV positive.  Patient and PCP stated that he is taking Destrigo qd.  TC to PCP; RN that TB RN spoke with could not find a +QFT for patient.  TB RN was instructed to call [facility] ID. [facility] Epic reviewed. Patient has appt with ID in September 2021 but no labs reporting +QFT.   Baseline labs and QFT completed today at appt. Patient sent for CXR. When discussing all of this patient asked to go get his mother out of the car.  Richmond Campbell, RN     [phone] [facility] ID  [phone] [facility]

## 2020-02-27 LAB — CBC WITH DIFFERENTIAL/PLATELET
Basophils Absolute: 0.1 10*3/uL (ref 0.0–0.2)
Basos: 1 %
EOS (ABSOLUTE): 0.3 10*3/uL (ref 0.0–0.4)
Eos: 7 %
Hematocrit: 43.9 % (ref 37.5–51.0)
Hemoglobin: 14.8 g/dL (ref 13.0–17.7)
Immature Grans (Abs): 0 10*3/uL (ref 0.0–0.1)
Immature Granulocytes: 0 %
Lymphocytes Absolute: 2.4 10*3/uL (ref 0.7–3.1)
Lymphs: 51 %
MCH: 32.3 pg (ref 26.6–33.0)
MCHC: 33.7 g/dL (ref 31.5–35.7)
MCV: 96 fL (ref 79–97)
Monocytes Absolute: 0.6 10*3/uL (ref 0.1–0.9)
Monocytes: 13 %
Neutrophils Absolute: 1.3 10*3/uL — ABNORMAL LOW (ref 1.4–7.0)
Neutrophils: 28 %
Platelets: 344 10*3/uL (ref 150–450)
RBC: 4.58 x10E6/uL (ref 4.14–5.80)
RDW: 13.3 % (ref 11.6–15.4)
WBC: 4.7 10*3/uL (ref 3.4–10.8)

## 2020-02-27 LAB — QUANTIFERON-TB GOLD PLUS
QuantiFERON Mitogen Value: >10 IU/mL
QuantiFERON Nil Value: 0.08 IU/mL
QuantiFERON TB1 Ag Value: 0.09 IU/mL
QuantiFERON TB2 Ag Value: 0.08 IU/mL
QuantiFERON-TB Gold Plus: NEGATIVE

## 2020-02-27 LAB — HEPATIC FUNCTION PANEL
ALT: 18 IU/L (ref 0–44)
AST: 24 IU/L (ref 0–40)
Albumin: 4.7 g/dL (ref 3.8–4.9)
Alkaline Phosphatase: 77 IU/L (ref 48–121)
Bilirubin Total: 0.2 mg/dL (ref 0.0–1.2)
Bilirubin, Direct: 0.08 mg/dL (ref 0.00–0.40)
Total Protein: 7.2 g/dL (ref 6.0–8.5)

## 2020-02-29 LAB — TB SKIN TEST
Induration: 0 mm
TB Skin Test: NEGATIVE

## 2020-03-08 ENCOUNTER — Telehealth: Payer: Self-pay

## 2020-03-08 NOTE — Telephone Encounter (Signed)
TB RN sent message to [facility] ID, [employer], via [employer]. Informed of neg QFT at [facility] recently, and no record of +PPD or +QFT per [facility] verbal report.  TC with patient and patient's mom, Charles Charles.  Informed of the above info and that Dr. Alvester Morin suggest we go ahead with LTBI tx. Will await [facility] ID response before moving forward. Richmond Campbell, RN

## 2020-03-22 ENCOUNTER — Other Ambulatory Visit: Payer: Self-pay

## 2020-03-22 DIAGNOSIS — Z227 Latent tuberculosis: Secondary | ICD-10-CM

## 2020-03-23 NOTE — Progress Notes (Addendum)
Tuberculosis treatment orders  All patients are to be monitored per [place] and county TB policies.   Charles Charles has latent TB. Treat for latent TB per the following:  Isoniazid 300mg  PO daily with Pyridoxine 25mg  PO daily x 9 months.  Draw LFTs monthly.  +QFT previously in 2021 with CXR without Acive TB on 02/24/20.   Medication recommendation by Dr. 2022, state TB Dr, due to patient's current HIV regimen.   LTBI orders sent to Dr. 02/26/20, covering physician.

## 2020-03-29 ENCOUNTER — Ambulatory Visit (LOCAL_COMMUNITY_HEALTH_CENTER): Payer: Medicaid Other

## 2020-03-29 ENCOUNTER — Other Ambulatory Visit: Payer: Self-pay

## 2020-03-29 VITALS — Wt 177.0 lb

## 2020-03-29 DIAGNOSIS — Z227 Latent tuberculosis: Secondary | ICD-10-CM

## 2020-03-30 LAB — HEPATIC FUNCTION PANEL
ALT: 17 IU/L (ref 0–44)
AST: 19 IU/L (ref 0–40)
Albumin: 4.2 g/dL (ref 3.8–4.9)
Alkaline Phosphatase: 73 IU/L (ref 44–121)
Bilirubin Total: <0.2 mg/dL (ref 0.0–1.2)
Bilirubin, Direct: <0.1 mg/dL (ref 0.00–0.40)
Total Protein: 6.8 g/dL (ref 6.0–8.5)

## 2020-04-03 NOTE — Progress Notes (Signed)
LFTs Normal.

## 2020-04-12 MED ORDER — ISONIAZID 300 MG PO TABS: 300.0000 mg | ORAL_TABLET | Freq: Every day | ORAL | 0 refills | Status: DC

## 2020-04-12 MED ORDER — PYRIDOXINE HCL 25 MG PO TABS: 25.0000 mg | ORAL_TABLET | Freq: Every day | ORAL | 0 refills | Status: AC

## 2020-04-12 NOTE — Progress Notes (Signed)
Dispensed #30 INH 300mg  and #30 25mg  B6 to patient per Dr. order , RN

## 2020-04-19 ENCOUNTER — Other Ambulatory Visit: Payer: Self-pay

## 2020-04-19 ENCOUNTER — Ambulatory Visit (LOCAL_COMMUNITY_HEALTH_CENTER): Payer: Medicaid Other

## 2020-04-19 VITALS — Wt 188.0 lb

## 2020-04-19 DIAGNOSIS — Z227 Latent tuberculosis: Secondary | ICD-10-CM

## 2020-04-19 MED ORDER — ISONIAZID 300 MG PO TABS: 300.0000 mg | ORAL_TABLET | Freq: Every day | ORAL | 0 refills | Status: AC

## 2020-04-19 MED ORDER — PYRIDOXINE HCL 25 MG PO TABS: 25.0000 mg | ORAL_TABLET | Freq: Every day | ORAL | 0 refills | Status: AC

## 2020-04-19 NOTE — Progress Notes (Signed)
INH 300mg  #30 and B6 25mg  #30 dispensed to patient.  Patient did bring med bottles from last visit (5 pills left in each bottle). Patient states has forgotten a few days.  Stressed importance of remembering to take pills daily around the same time each day. Patient and his mom verbalized understanding , RN

## 2020-04-20 LAB — HEPATIC FUNCTION PANEL
ALT: 22 IU/L (ref 0–44)
AST: 22 IU/L (ref 0–40)
Albumin: 4.3 g/dL (ref 3.8–4.9)
Alkaline Phosphatase: 69 IU/L (ref 44–121)
Bilirubin Total: <0.2 mg/dL (ref 0.0–1.2)
Bilirubin, Direct: <0.1 mg/dL (ref 0.00–0.40)
Total Protein: 7 g/dL (ref 6.0–8.5)

## 2020-06-20 ENCOUNTER — Telehealth: Payer: Self-pay

## 2020-06-20 DIAGNOSIS — Z227 Latent tuberculosis: Secondary | ICD-10-CM

## 2020-06-20 NOTE — Telephone Encounter (Signed)
TC with patient's mom after numerous attempts to reach patient.  Mom reports patient left in November from her home and she hasn't seen him since.  States patient is on drugs and only calls when needs money.   TB RN will attempt to reach patient once again but will likely close to TB f/u. Richmond Campbell, RN

## 2020-09-14 NOTE — Telephone Encounter (Signed)
Numerous phone calls to and from patient's mother.  Mother has not seen patient for some time now.  #2 letters mailed attempting to reach patient.  Closed to TB f/u Richmond Campbell, RN

## 2021-01-18 ENCOUNTER — Encounter: Payer: Self-pay | Admitting: Emergency Medicine

## 2021-01-18 ENCOUNTER — Other Ambulatory Visit: Payer: Self-pay

## 2021-01-18 ENCOUNTER — Emergency Department
Admission: EM | Admit: 2021-01-18 | Discharge: 2021-01-19 | Disposition: A | Discharge status: Other Acute Inpatient Hospital | Payer: Medicaid Other | Attending: Student in an Organized Health Care Education/Training Program | Admitting: Student in an Organized Health Care Education/Training Program

## 2021-01-18 DIAGNOSIS — Z7982 Long term (current) use of aspirin: Secondary | ICD-10-CM | POA: Insufficient documentation

## 2021-01-18 DIAGNOSIS — F1010 Alcohol abuse, uncomplicated: Secondary | ICD-10-CM | POA: Insufficient documentation

## 2021-01-18 DIAGNOSIS — R443 Hallucinations, unspecified: Secondary | ICD-10-CM | POA: Insufficient documentation

## 2021-01-18 DIAGNOSIS — E039 Hypothyroidism, unspecified: Secondary | ICD-10-CM | POA: Insufficient documentation

## 2021-01-18 DIAGNOSIS — F203 Undifferentiated schizophrenia: Secondary | ICD-10-CM | POA: Diagnosis present

## 2021-01-18 DIAGNOSIS — Z20822 Contact with and (suspected) exposure to covid-19: Secondary | ICD-10-CM | POA: Insufficient documentation

## 2021-01-18 DIAGNOSIS — F17210 Nicotine dependence, cigarettes, uncomplicated: Secondary | ICD-10-CM | POA: Insufficient documentation

## 2021-01-18 DIAGNOSIS — F1420 Cocaine dependence, uncomplicated: Secondary | ICD-10-CM | POA: Diagnosis not present

## 2021-01-18 DIAGNOSIS — Z79899 Other long term (current) drug therapy: Secondary | ICD-10-CM | POA: Insufficient documentation

## 2021-01-18 DIAGNOSIS — R45851 Suicidal ideations: Secondary | ICD-10-CM | POA: Diagnosis not present

## 2021-01-18 DIAGNOSIS — B20 Human immunodeficiency virus [HIV] disease: Secondary | ICD-10-CM | POA: Diagnosis not present

## 2021-01-18 HISTORY — DX: Alcohol abuse, uncomplicated: F10.10

## 2021-01-18 LAB — COMPREHENSIVE METABOLIC PANEL
ALT: 19 U/L (ref 0–44)
AST: 22 U/L (ref 15–41)
Albumin: 3.4 g/dL — ABNORMAL LOW (ref 3.5–5.0)
Alkaline Phosphatase: 62 U/L (ref 38–126)
Anion gap: 11 (ref 5–15)
BUN: 14 mg/dL (ref 8–23)
CO2: 27 mmol/L (ref 22–32)
Calcium: 9.5 mg/dL (ref 8.9–10.3)
Chloride: 101 mmol/L (ref 98–111)
Creatinine, Ser: 1.08 mg/dL (ref 0.61–1.24)
GFR, Estimated: >60 mL/min (ref >60)
Glucose, Bld: 114 mg/dL — ABNORMAL HIGH (ref 70–99)
Potassium: 3.9 mmol/L (ref 3.5–5.1)
Sodium: 139 mmol/L (ref 135–145)
Total Bilirubin: 0.7 mg/dL (ref 0.3–1.2)
Total Protein: 7.4 g/dL (ref 6.5–8.1)

## 2021-01-18 LAB — CBC
HCT: 45.2 % (ref 39.0–52.0)
Hemoglobin: 15.8 g/dL (ref 13.0–17.0)
MCH: 32.7 pg (ref 26.0–34.0)
MCHC: 35 g/dL (ref 30.0–36.0)
MCV: 93.6 fL (ref 80.0–100.0)
Platelets: 355 10*3/uL (ref 150–400)
RBC: 4.83 MIL/uL (ref 4.22–5.81)
RDW: 13.4 % (ref 11.5–15.5)
WBC: 5.3 10*3/uL (ref 4.0–10.5)
nRBC: 0 % (ref 0.0–0.2)

## 2021-01-18 LAB — URINE DRUG SCREEN, QUALITATIVE (ARMC ONLY)
Amphetamines, Ur Screen: NOT DETECTED
Barbiturates, Ur Screen: NOT DETECTED
Benzodiazepine, Ur Scrn: NOT DETECTED
Cannabinoid 50 Ng, Ur ~~LOC~~: POSITIVE — AB
Cocaine Metabolite,Ur ~~LOC~~: POSITIVE — AB
MDMA (Ecstasy)Ur Screen: NOT DETECTED
Methadone Scn, Ur: NOT DETECTED
Opiate, Ur Screen: NOT DETECTED
Phencyclidine (PCP) Ur S: NOT DETECTED
Tricyclic, Ur Screen: NOT DETECTED

## 2021-01-18 LAB — ETHANOL: Alcohol, Ethyl (B): <10 mg/dL (ref <10)

## 2021-01-18 LAB — RESP PANEL BY RT-PCR (FLU A&B, COVID) ARPGX2
Influenza A by PCR: NEGATIVE
Influenza B by PCR: NEGATIVE
SARS Coronavirus 2 by RT PCR: NEGATIVE

## 2021-01-18 LAB — TSH: TSH: 6.227 u[IU]/mL — ABNORMAL HIGH (ref 0.350–4.500)

## 2021-01-18 LAB — ACETAMINOPHEN LEVEL: Acetaminophen (Tylenol), Serum: <10 ug/mL — ABNORMAL LOW (ref 10–30)

## 2021-01-18 LAB — SALICYLATE LEVEL: Salicylate Lvl: <7 mg/dL — ABNORMAL LOW (ref 7.0–30.0)

## 2021-01-18 MED ORDER — BENZTROPINE MESYLATE 1 MG PO TABS
0.5000 mg | ORAL_TABLET | Freq: Two times a day (BID) | ORAL | Status: DC
  Administered 2021-01-18 (×2): 0.5 mg via ORAL
  Filled 2021-01-18 (×2): qty 1

## 2021-01-18 MED ORDER — HALOPERIDOL 5 MG PO TABS
5.0000 mg | ORAL_TABLET | Freq: Two times a day (BID) | ORAL | Status: DC
  Administered 2021-01-18 (×2): 5 mg via ORAL
  Filled 2021-01-18 (×2): qty 1

## 2021-01-18 NOTE — ED Notes (Signed)
Pt given lunch tray.

## 2021-01-18 NOTE — Consult Note (Signed)
[facility] Face-to-Face Psychiatry Consult   Reason for Consult: 60 year old man in the emergency room voluntarily complaining of hallucinations depression and substance abuse Referring Physician: Roxan Hockey Patient Identification: Charles Charles MRN:  [id number] Principal Diagnosis: Undifferentiated schizophrenia (HCC) Diagnosis:  Principal Problem:   Undifferentiated schizophrenia (HCC) Active Problems:   Cocaine use disorder, moderate, dependence (HCC)   Alcohol abuse   Total Time spent with patient: 1 hour  Subjective:   Charles Charles is a 60 y.o. male patient admitted with "the voices came back and they are bad".  HPI: Patient seen chart reviewed.  60 year old man with a history of schizophrenia and substance abuse came voluntarily to the emergency room saying that he has been having auditory hallucinations.  They have been going on for about 4 weeks and are getting worse.  He characterizes them as telling him that he should hurt somebody.  Mood has been bad with depression.  Not sleeping well and not eating well.  Admits that he has been using crack cocaine and drinking regularly.  He will only describe his use as being "a lot".  Patient is homeless has no place to stay currently.  Has not been taking any psychiatric medicines by his report in about 8 to 9 months.  Patient also states that he is HIV positive which is not in our previous chart and that he has not been on medicine in many months.  Past Psychiatric History: Past history of a diagnosis of schizophrenia and of substance abuse.  Last hospitalization in 2017 at our facility.  Treated at that time with Haldol decanoate.  Risk to Self:   Risk to Others:   Prior Inpatient Therapy:   Prior Outpatient Therapy:    Past Medical History:  Past Medical History:  Diagnosis Date   GERD (gastroesophageal reflux disease)    Headache    Schizo-affective schizophrenia (HCC)     Past Surgical History:  Procedure Laterality Date   COLONOSCOPY  WITH PROPOFOL N/A 08/04/2015   Procedure: COLONOSCOPY WITH PROPOFOL;  Surgeon: Midge Minium, MD;  [facility];  Service: Endoscopy;  Laterality: N/A;   INCISION AND DRAINAGE PERIRECTAL ABSCESS N/A 07/14/2015   Procedure: IRRIGATION AND DEBRIDEMENT PERIRECTAL ABSCESS;  Surgeon: Lattie Haw, MD;  Location: [facility] ORS;  Service: General;  Laterality: N/A;   INCISION AND DRAINAGE PERIRECTAL ABSCESS N/A 08/14/2015   Procedure: IRRIGATION AND DEBRIDEMENT PERIRECTAL ABSCESS;  Surgeon: Gladis Riffle, MD;  Location: [facility] OR[facility];  Service: General;  Laterality: N/A;   Family History:  Family History  Problem Relation Age of Onset   Anxiety disorder Mother    Diabetes Mother    Diabetes Father    Family Psychiatric  History: None known Social History:  Social History   Substance and Sexual Activity  Alcohol Use Yes   Alcohol/week: 56.0 standard drinks   Types: 56 Cans of beer per week   Comment: 2 x 40's daily - pt says no alcohol for several weeks     Social History   Substance and Sexual Activity  Drug Use Yes   Types: Cocaine, Marijuana   Comment: back in the days    Social History   Socioeconomic History   Marital status: Single    Spouse name: Not on file   Number of children: Not on file   Years of education: Not on file   Highest education level: Not on file  Occupational History   Not on file  Tobacco Use   Smoking status:  Every Day    Packs/day: 2.00    Types: Cigarettes   Smokeless tobacco: Never   Tobacco comments:    (1-2 cigs/day)  Substance and Sexual Activity   Alcohol use: Yes    Alcohol/week: 56.0 standard drinks    Types: 56 Cans of beer per week    Comment: 2 x 40's daily - pt says no alcohol for several weeks   Drug use: Yes    Types: Cocaine, Marijuana    Comment: back in the days   Sexual activity: Never  Other Topics Concern   Not on file  Social History Narrative   Not on file   Social Determinants of Health   Financial  Resource Strain: Not on file  Food Insecurity: Not on file  Transportation Needs: Not on file  Physical Activity: Not on file  Stress: Not on file  Social Connections: Not on file   Additional Social History:    Allergies:   Allergies  Allergen Reactions   Penicillins Anaphylaxis, Hives and Other (See Comments)    Has patient had a PCN reaction causing immediate rash, facial/tongue/throat swelling, SOB or lightheadedness with hypotension: Yes Has patient had a PCN reaction causing severe rash involving mucus membranes or skin necrosis: No Has patient had a PCN reaction that required hospitalization No Has patient had a PCN reaction occurring within the last 10 years: Yes If all of the above answers are "NO", then may proceed with Cephalosporin use. Other reaction(s): UNKNOWN Has patient had a PCN reaction causing immediate rash, facial/tongue/throat swelling, SOB or lightheadedness with hypotension: Yes Has patient had a PCN reaction causing severe rash involving mucus membranes or skin necrosis: No Has patient had a PCN reaction that required hospitalization No Has patient had a PCN reaction occurring within the last 10 years: Yes If all of the above answers are "NO", then may proceed with Cephalosporin use.   Sulfa Antibiotics     Labs:  Results for orders placed or performed during the hospital encounter of 01/18/21 (from the past 48 hour(s))  Comprehensive metabolic panel     Status: Abnormal   Collection Time: 01/18/21  7:26 AM  Result Value Ref Range   Sodium 139 135 - 145 mmol/L   Potassium 3.9 3.5 - 5.1 mmol/L   Chloride 101 98 - 111 mmol/L   CO2 27 22 - 32 mmol/L   Glucose, Bld 114 (H) 70 - 99 mg/dL    Comment: Glucose reference range applies only to samples taken after fasting for at least 8 hours.   BUN 14 8 - 23 mg/dL   Creatinine, Ser 5.32 0.61 - 1.24 mg/dL   Calcium 9.5 8.9 - 99.2 mg/dL   Total Protein 7.4 6.5 - 8.1 g/dL   Albumin 3.4 (L) 3.5 - 5.0 g/dL   AST  22 15 - 41 U/L   ALT 19 0 - 44 U/L   Alkaline Phosphatase 62 38 - 126 U/L   Total Bilirubin 0.7 0.3 - 1.2 mg/dL   GFR, Estimated >42 >68 mL/min    Comment: (NOTE) Calculated using the CKD-EPI Creatinine Equation (2021)    Anion gap 11 5 - 15    Comment: Performed at [facility], [address], [place], [place] [address]  Ethanol     Status: None   Collection Time: 01/18/21  7:26 AM  Result Value Ref Range   Alcohol, Ethyl (B) <10 <10 mg/dL    Comment: (NOTE) Lowest detectable limit for serum alcohol is 10  mg/dL.  For medical purposes only. Performed at [facility], [address] [place], [place] [address]   Salicylate level     Status: Abnormal   Collection Time: 01/18/21  7:26 AM  Result Value Ref Range   Salicylate Lvl <7.0 (L) 7.0 - 30.0 mg/dL    Comment: Performed at [facility], [address] [place], [place] [address]  Acetaminophen level     Status: Abnormal   Collection Time: 01/18/21  7:26 AM  Result Value Ref Range   Acetaminophen (Tylenol), Serum <10 (L) 10 - 30 ug/mL    Comment: (NOTE) Therapeutic concentrations vary significantly. A range of 10-30 ug/mL  may be an effective concentration for many patients. However, some  are best treated at concentrations outside of this range. Acetaminophen concentrations >150 ug/mL at 4 hours after ingestion  and >50 ug/mL at 12 hours after ingestion are often associated with  toxic reactions.  Performed at [facility], [address] [place], [place] [address]   cbc     Status: None   Collection Time: 01/18/21  7:26 AM  Result Value Ref Range   WBC 5.3 4.0 - 10.5 K/uL   RBC 4.83 4.22 - 5.81 MIL/uL   Hemoglobin 15.8 13.0 - 17.0 g/dL   HCT 21.3 08.6 - 57.8 %   MCV 93.6 80.0 - 100.0 fL   MCH 32.7 26.0 - 34.0 pg   MCHC 35.0 30.0 - 36.0 g/dL   RDW 46.9 62.9 - 52.8 %   Platelets 355 150 - 400 K/uL   nRBC 0.0 0.0 - 0.2 %    Comment: Performed at [facility],  [address] [place], [place] [address]  Urine Drug Screen, Qualitative     Status: Abnormal   Collection Time: 01/18/21  7:26 AM  Result Value Ref Range   Tricyclic, Ur Screen NONE DETECTED NONE DETECTED   Amphetamines, Ur Screen NONE DETECTED NONE DETECTED   MDMA (Ecstasy)Ur Screen NONE DETECTED NONE DETECTED   Cocaine Metabolite,Ur [place] POSITIVE (A) NONE DETECTED   Opiate, Ur Screen NONE DETECTED NONE DETECTED   Phencyclidine (PCP) Ur S NONE DETECTED NONE DETECTED   Cannabinoid 50 Ng, Ur [place] POSITIVE (A) NONE DETECTED   Barbiturates, Ur Screen NONE DETECTED NONE DETECTED   Benzodiazepine, Ur Scrn NONE DETECTED NONE DETECTED   Methadone Scn, Ur NONE DETECTED NONE DETECTED    Comment: (NOTE) Tricyclics + metabolites, urine    Cutoff 1000 ng/mL Amphetamines + metabolites, urine  Cutoff 1000 ng/mL MDMA (Ecstasy), urine              Cutoff 500 ng/mL Cocaine Metabolite, urine          Cutoff 300 ng/mL Opiate + metabolites, urine        Cutoff 300 ng/mL Phencyclidine (PCP), urine         Cutoff 25 ng/mL Cannabinoid, urine                 Cutoff 50 ng/mL Barbiturates + metabolites, urine  Cutoff 200 ng/mL Benzodiazepine, urine              Cutoff 200 ng/mL Methadone, urine                   Cutoff 300 ng/mL  The urine drug screen provides only a preliminary, unconfirmed analytical test result and should not be used for non-medical purposes. Clinical consideration and professional judgment should be applied to any positive drug screen result due  to possible interfering substances. A more specific alternate chemical method must be used in order to obtain a confirmed analytical result. Gas chromatography / mass spectrometry (GC/MS) is the preferred confirm atory method. Performed at [facility], [address] [place], [place] [address]   Resp Panel by RT-PCR (Flu A&B, Covid) Nasopharyngeal Swab     Status: None   Collection Time: 01/18/21 10:21 AM   Specimen:  Nasopharyngeal Swab; Nasopharyngeal(NP) swabs in vial transport medium  Result Value Ref Range   SARS Coronavirus 2 by RT PCR NEGATIVE NEGATIVE    Comment: (NOTE) SARS-CoV-2 target nucleic acids are NOT DETECTED.  The SARS-CoV-2 RNA is generally detectable in upper respiratory specimens during the acute phase of infection. The lowest concentration of SARS-CoV-2 viral copies this assay can detect is 138 copies/mL. A negative result does not preclude SARS-Cov-2 infection and should not be used as the sole basis for treatment or other patient management decisions. A negative result may occur with  improper specimen collection/handling, submission of specimen other than nasopharyngeal swab, presence of viral mutation(s) within the areas targeted by this assay, and inadequate number of viral copies(<138 copies/mL). A negative result must be combined with clinical observations, patient history, and epidemiological information. The expected result is Negative.  Fact Sheet for Patients:  BloggerCourse.comhttps://www.fda.gov/media/152166/download  Fact Sheet for Healthcare Providers:  SeriousBroker.ithttps://www.fda.gov/media/152162/download  This test is no t yet approved or cleared by the [place] FDA and  has been authorized for detection and/or diagnosis of SARS-CoV-2 by FDA under an Emergency Use Authorization (EUA). This EUA will remain  in effect (meaning this test can be used) for the duration of the COVID-19 declaration under Section 564(b)(1) of the Act, 21 U.S.C.section 360bbb-3(b)(1), unless the authorization is terminated  or revoked sooner.       Influenza A by PCR NEGATIVE NEGATIVE   Influenza B by PCR NEGATIVE NEGATIVE    Comment: (NOTE) The Xpert Xpress SARS-CoV-2/FLU/RSV plus assay is intended as an aid in the diagnosis of influenza from Nasopharyngeal swab specimens and should not be used as a sole basis for treatment. Nasal washings and aspirates are unacceptable for Xpert Xpress  SARS-CoV-2/FLU/RSV testing.  Fact Sheet for Patients: BloggerCourse.comhttps://www.fda.gov/media/152166/download  Fact Sheet for Healthcare Providers: SeriousBroker.ithttps://www.fda.gov/media/152162/download  This test is not yet approved or cleared by the [place] FDA and has been authorized for detection and/or diagnosis of SARS-CoV-2 by FDA under an Emergency Use Authorization (EUA). This EUA will remain in effect (meaning this test can be used) for the duration of the COVID-19 declaration under Section 564(b)(1) of the Act, 21 U.S.C. section 360bbb-3(b)(1), unless the authorization is terminated or revoked.  Performed at [facility], [address] [place], [place] [address]     Current Facility-Administered Medications  Medication Dose Route Frequency Provider Last Rate Last Admin   benztropine (COGENTIN) tablet 0.5 mg  0.5 mg Oral BID Godric Lavell, Jackquline DenmarkJohn T, MD       haloperidol (HALDOL) tablet 5 mg  5 mg Oral BID Brinley Treanor, Jackquline DenmarkJohn T, MD       Current Outpatient Medications  Medication Sig Dispense Refill   aspirin 81 MG EC tablet Take 1 tablet (81 mg total) by mouth daily. 30 tablet 1   benztropine (COGENTIN) 1 MG tablet Take 1 tablet (1 mg total) by mouth daily. 30 tablet 1   Doravirin-Lamivudin-Tenofov DF (DELSTRIGO PO) Take by mouth.     famotidine (PEPCID) 20 MG tablet Take 1 tablet (20 mg total) by mouth 2 (two) times  daily. 60 tablet 1   haloperidol decanoate (HALDOL DECANOATE) 100 MG/ML injection Inject 1 mL (100 mg total) into the muscle every 30 (thirty) days. 1 mL 1   ibuprofen (ADVIL,MOTRIN) 600 MG tablet Take 1 tablet (600 mg total) by mouth every 6 (six) hours as needed. 30 tablet 0   isoniazid (NYDRAZID) 300 MG tablet Take 1 tablet (300 mg total) by mouth daily. 30 tablet 0   levothyroxine (SYNTHROID, LEVOTHROID) 50 MCG tablet Take 1 tablet (50 mcg total) by mouth daily before breakfast. 30 tablet 1   traZODone (DESYREL) 50 MG tablet Take 1 tablet (50 mg total) by mouth at bedtime. 30  tablet 1    Musculoskeletal: Strength & Muscle Tone: within normal limits Gait & Station: normal Patient leans: N/A            Psychiatric Specialty Exam:  Presentation  General Appearance:  No data recorded Eye Contact: No data recorded Speech: No data recorded Speech Volume: No data recorded Handedness: No data recorded  Mood and Affect  Mood: No data recorded Affect: No data recorded  Thought Process  Thought Processes: No data recorded Descriptions of Associations:No data recorded Orientation:No data recorded Thought Content:No data recorded History of Schizophrenia/Schizoaffective disorder:No data recorded Duration of Psychotic Symptoms:No data recorded Hallucinations:No data recorded Ideas of Reference:No data recorded Suicidal Thoughts:No data recorded Homicidal Thoughts:No data recorded  Sensorium  Memory: No data recorded Judgment: No data recorded Insight: No data recorded  Executive Functions  Concentration: No data recorded Attention Span: No data recorded Recall: No data recorded Fund of Knowledge: No data recorded Language: No data recorded  Psychomotor Activity  Psychomotor Activity: No data recorded  Assets  Assets: No data recorded  Sleep  Sleep: No data recorded  Physical Exam: Physical Exam Vitals and nursing note reviewed.  Constitutional:      Appearance: Normal appearance.  HENT:     Head: Normocephalic and atraumatic.     Mouth/Throat:     Pharynx: Oropharynx is clear.  Eyes:     Pupils: Pupils are equal, round, and reactive to light.  Cardiovascular:     Rate and Rhythm: Normal rate and regular rhythm.  Pulmonary:     Effort: Pulmonary effort is normal.     Breath sounds: Normal breath sounds.  Abdominal:     General: Abdomen is flat.     Palpations: Abdomen is soft.  Musculoskeletal:        General: Normal range of motion.  Skin:    General: Skin is warm and dry.  Neurological:      General: No focal deficit present.     Mental Status: He is alert. Mental status is at baseline.  Psychiatric:        Attention and Perception: Attention normal. He perceives auditory hallucinations.        Mood and Affect: Mood is depressed. Affect is blunt.        Speech: Speech is delayed.        Behavior: Behavior is slowed.        Thought Content: Thought content includes suicidal ideation. Thought content does not include suicidal plan.        Cognition and Memory: Memory is impaired.   Review of Systems  Constitutional: Negative.   HENT: Negative.    Eyes: Negative.   Respiratory: Negative.    Cardiovascular: Negative.   Gastrointestinal: Negative.   Musculoskeletal: Negative.   Skin: Negative.   Neurological: Negative.   Psychiatric/Behavioral:  Positive for  depression, hallucinations, substance abuse and suicidal ideas.   Blood pressure 102/71, pulse 61, temperature 97.9 F (36.6 C), temperature source Oral, resp. rate 18, weight 85.3 kg, SpO2 99 %. Body mass index is 33.31 kg/m.  Treatment Plan Summary: Medication management and Plan 60 year old man with a past diagnosis of schizophrenia claims to be having auditory hallucinations telling him to hurt someone and to be actively using alcohol and cocaine.  Perhaps not coincidentally he is homeless now.  Had been staying in town with his mother who he reports will no longer allow him to stay there.  Patient stating he is HIV positive but we do not have any definite records so far that I have found.  Plan to admit to psychiatric hospital but we have no beds available now.  Unclear when that will change.  Patient is currently voluntary does not require IVC.  Restart Haloperidol and Cogentin based on previous medicine in 2017.  Check TSH because of history of hypothyroidism.  Consulted infectious disease regarding appropriate follow through on his claim of being HIV positive.  Disposition: Recommend psychiatric Inpatient admission  when medically cleared. Supportive therapy provided about ongoing stressors.  Mordecai Rasmussen, MD 01/18/2021 2:16 PM

## 2021-01-18 NOTE — BH Assessment (Signed)
Referral information for Psychiatric Hospitalization faxed to;   [facility] ([phone]),   [facility] [phone] -[phone]) [phone]  [facility] [phone]),  [place] [phone], [phone], [phone] or [phone]),   [facility] [phone]),   [facility] [phone] -or- [phone]),   [facility] [phone] or [phone]),

## 2021-01-18 NOTE — ED Notes (Signed)
VOLUNTARY/per Dr Toni Amend recommend inpt admit, pt isn't IVC at this time

## 2021-01-18 NOTE — ED Triage Notes (Signed)
Pt brought in by [employer], voluntary with SI. States hx of this, and schizophrenia. Endorses AH/VH, no plan to harm self, just doesn't want to be alive. Cocaine use 3 days ago, homeless. All belongings collected in triage, dressed out in wine scrubs

## 2021-01-18 NOTE — Consult Note (Signed)
NAME: Charles Charles  DOB: [birth year]  MRN: [id number]  Date/Time: 01/18/2021 9:00 PM  REQUESTING PROVIDER: dr.Clapacs Subjective:  REASON FOR CONSULT: HIV ? Charles Charles is a 60 y.o. male with a history of schizoaffective disorder presented with   HEARING VOICES AND SUICIDAL IDEATION. Patient was diagnosed with HIV 3 years ago and was getting care at [facility].  Marland Kitchen  And was on Delstrigo.  His last viral load From 03/07/2020 was undetectable.  CD4 at that time was [place].  Patient has not been taking treatment for the last 8 months since he moved out of his mom's place in [place].  He is unable to travel to [facility].  He denies any fever night sweats.  He has weight loss He had some diarrhea before No cough or shortness of breath.  He is willing to be engaged in care.  Past Medical History:  Diagnosis Date   GERD (gastroesophageal reflux disease)    Headache    Schizo-affective schizophrenia (HCC)     Past Surgical History:  Procedure Laterality Date   COLONOSCOPY WITH PROPOFOL N/A 08/04/2015   Procedure: COLONOSCOPY WITH PROPOFOL;  Surgeon: Midge Minium, MD;  Location: [facility];  Service: Endoscopy;  Laterality: N/A;   INCISION AND DRAINAGE PERIRECTAL ABSCESS N/A 07/14/2015   Procedure: IRRIGATION AND DEBRIDEMENT PERIRECTAL ABSCESS;  Surgeon: Lattie Haw, MD;  Location: [facility];  Service: General;  Laterality: N/A;   INCISION AND DRAINAGE PERIRECTAL ABSCESS N/A 08/14/2015   Procedure: IRRIGATION AND DEBRIDEMENT PERIRECTAL ABSCESS;  Surgeon: Gladis Riffle, MD;  Location: [facility];  Service: General;  Laterality: N/A;    Social History   Socioeconomic History   Marital status: Single    Spouse name: Not on file   Number of children: Not on file   Years of education: Not on file   Highest education level: Not on file  Occupational History   Not on file  Tobacco Use   Smoking status: Every Day    Packs/day: 2.00    Types: Cigarettes   Smokeless tobacco: Never   Tobacco comments:     (1-2 cigs/day)  Substance and Sexual Activity   Alcohol use: Yes    Alcohol/week: 56.0 standard drinks    Types: 56 Cans of beer per week    Comment: 2 x 40's daily - pt says no alcohol for several weeks   Drug use: Yes    Types: Cocaine, Marijuana    Comment: back in the days   Sexual activity: Never  Other Topics Concern   Not on file  Social History Narrative   Not on file   Social Determinants of Health   Financial Resource Strain: Not on file  Food Insecurity: Not on file  Transportation Needs: Not on file  Physical Activity: Not on file  Stress: Not on file  Social Connections: Not on file  Intimate Partner Violence: Not on file    Family History  Problem Relation Age of Onset   Anxiety disorder Mother    Diabetes Mother    Diabetes Father    Allergies  Allergen Reactions   Penicillins Anaphylaxis, Hives and Other (See Comments)    Has patient had a PCN reaction causing immediate rash, facial/tongue/throat swelling, SOB or lightheadedness with hypotension: Yes Has patient had a PCN reaction causing severe rash involving mucus membranes or skin necrosis: No Has patient had a PCN reaction that required hospitalization No Has patient had a PCN reaction occurring within the last 10 years:  Yes If all of the above answers are "NO", then may proceed with Cephalosporin use. Other reaction(s): UNKNOWN Has patient had a PCN reaction causing immediate rash, facial/tongue/throat swelling, SOB or lightheadedness with hypotension: Yes Has patient had a PCN reaction causing severe rash involving mucus membranes or skin necrosis: No Has patient had a PCN reaction that required hospitalization No Has patient had a PCN reaction occurring within the last 10 years: Yes If all of the above answers are "NO", then may proceed with Cephalosporin use.   Sulfa Antibiotics Itching   I? Current Facility-Administered Medications  Medication Dose Route Frequency Provider Last Rate Last  Admin   benztropine (COGENTIN) tablet 0.5 mg  0.5 mg Oral BID Clapacs, John T, MD   0.5 mg at 01/18/21 1436   haloperidol (HALDOL) tablet 5 mg  5 mg Oral BID Clapacs, Jackquline DenmarkJohn T, MD   5 mg at 01/18/21 1437   Current Outpatient Medications  Medication Sig Dispense Refill   aspirin 81 MG EC tablet Take 1 tablet (81 mg total) by mouth daily. (Patient not taking: No sig reported) 30 tablet 1   benztropine (COGENTIN) 1 MG tablet Take 1 tablet (1 mg total) by mouth daily. 30 tablet 1   Doravirin-Lamivudin-Tenofov DF (DELSTRIGO PO) Take by mouth. (Patient not taking: No sig reported)     famotidine (PEPCID) 20 MG tablet Take 1 tablet (20 mg total) by mouth 2 (two) times daily. (Patient not taking: No sig reported) 60 tablet 1   haloperidol decanoate (HALDOL DECANOATE) 100 MG/ML injection Inject 1 mL (100 mg total) into the muscle every 30 (thirty) days. (Patient not taking: No sig reported) 1 mL 1   ibuprofen (ADVIL,MOTRIN) 600 MG tablet Take 1 tablet (600 mg total) by mouth every 6 (six) hours as needed. (Patient not taking: No sig reported) 30 tablet 0   isoniazid (NYDRAZID) 300 MG tablet Take 1 tablet (300 mg total) by mouth daily. (Patient not taking: No sig reported) 30 tablet 0   levothyroxine (SYNTHROID, LEVOTHROID) 50 MCG tablet Take 1 tablet (50 mcg total) by mouth daily before breakfast. (Patient not taking: No sig reported) 30 tablet 1   traZODone (DESYREL) 50 MG tablet Take 1 tablet (50 mg total) by mouth at bedtime. (Patient not taking: No sig reported) 30 tablet 1     Abtx:  Anti-infectives (From admission, onward)    None       REVIEW OF SYSTEMS:  Const: negative fever, negative chills, positive weight loss Eyes: negative diplopia or visual changes, negative eye pain ENT: negative coryza, negative sore throat Resp: negative cough, hemoptysis, dyspnea Cards: negative for chest pain, palpitations, lower extremity edema GU: negative for frequency, dysuria and hematuria GI: Negative  for abdominal pain, diarrhea, bleeding, constipation Skin: negative for rash and pruritus Heme: negative for easy bruising and gum/nose bleeding MS: negative for myalgias, arthralgias, back pain and muscle weakness Neurolo:negative for headaches, dizziness, vertigo, memory problems  Psych: schizoaffective  Endocrine: negative for thyroid, diabetes Allergy/Immunology- as above Objective:  VITALS:  BP 98/68 (BP Location: Right Arm)   Pulse (!) 58   Temp 97.9 F (36.6 C) (Oral)   Resp 16   Wt 85.3 kg   SpO2 100%   BMI 33.31 kg/m  PHYSICAL EXAM:  General: Alert, cooperative, no distress, appears stated age.  Head: Normocephalic, without obvious abnormality, atraumatic. Eyes: Conjunctivae clear, anicteric sclerae. Pupils are equal ENT Nares normal. No drainage or sinus tenderness. Lips, mucosa, and tongue normal. No Thrush Neck: Supple,  symmetrical, no adenopathy, thyroid: non tender no carotid bruit and no JVD. Back: No CVA tenderness. Lungs: Clear to auscultation bilaterally. No Wheezing or Rhonchi. No rales. Heart: Regular rate and rhythm, no murmur, rub or gallop. Abdomen: Soft, non-tender,not distended. Bowel sounds normal. No masses Extremities: atraumatic, no cyanosis. No edema. No clubbing Skin: No rashes or lesions. Or bruising Lymph: Cervical, supraclavicular normal. Neurologic: Grossly non-focal Pertinent Labs Lab Results CBC    Component Value Date/Time   WBC 5.3 01/18/2021 0726   RBC 4.83 01/18/2021 0726   HGB 15.8 01/18/2021 0726   HGB 14.8 02/24/2020 0955   HCT 45.2 01/18/2021 0726   HCT 43.9 02/24/2020 0955   PLT 355 01/18/2021 0726   PLT 344 02/24/2020 0955   MCV 93.6 01/18/2021 0726   MCV 96 02/24/2020 0955   MCV 96 01/24/2014 0531   MCH 32.7 01/18/2021 0726   MCHC 35.0 01/18/2021 0726   RDW 13.4 01/18/2021 0726   RDW 13.3 02/24/2020 0955   RDW 14.0 01/24/2014 0531   LYMPHSABS 2.4 02/24/2020 0955   LYMPHSABS 2.2 01/20/2014 1019   MONOABS 0.8  09/04/2015 1527   MONOABS 0.8 01/20/2014 1019   EOSABS 0.3 02/24/2020 0955   EOSABS 0.1 01/20/2014 1019   BASOSABS 0.1 02/24/2020 0955   BASOSABS 0.1 01/20/2014 1019    CMP Latest Ref Rng & Units 01/18/2021 04/19/2020 03/29/2020  Glucose 70 - 99 mg/dL 169(C) - -  BUN 8 - 23 mg/dL 14 - -  Creatinine 7.89 - 1.24 mg/dL 3.81 - -  Sodium 017 - 145 mmol/L 139 - -  Potassium 3.5 - 5.1 mmol/L 3.9 - -  Chloride 98 - 111 mmol/L 101 - -  CO2 22 - 32 mmol/L 27 - -  Calcium 8.9 - 10.3 mg/dL 9.5 - -  Total Protein 6.5 - 8.1 g/dL 7.4 7.0 6.8  Total Bilirubin 0.3 - 1.2 mg/dL 0.7 <5.1 <0.2  Alkaline Phos 38 - 126 U/L 62 69 73  AST 15 - 41 U/L 22 22 19   ALT 0 - 44 U/L 19 22 17       Microbiology: Recent Results (from the past 240 hour(s))  Resp Panel by RT-PCR (Flu A&B, Covid) Nasopharyngeal Swab     Status: None   Collection Time: 01/18/21 10:21 AM   Specimen: Nasopharyngeal Swab; Nasopharyngeal(NP) swabs in vial transport medium  Result Value Ref Range Status   SARS Coronavirus 2 by RT PCR NEGATIVE NEGATIVE Final    Comment: (NOTE) SARS-CoV-2 target nucleic acids are NOT DETECTED.  The SARS-CoV-2 RNA is generally detectable in upper respiratory specimens during the acute phase of infection. The lowest concentration of SARS-CoV-2 viral copies this assay can detect is 138 copies/mL. A negative result does not preclude SARS-Cov-2 infection and should not be used as the sole basis for treatment or other patient management decisions. A negative result may occur with  improper specimen collection/handling, submission of specimen other than nasopharyngeal swab, presence of viral mutation(s) within the areas targeted by this assay, and inadequate number of viral copies(<138 copies/mL). A negative result must be combined with clinical observations, patient history, and epidemiological information. The expected result is Negative.  Fact Sheet for Patients:    Fact Sheet for Healthcare Providers:  01/20/21  This test is no t yet approved or cleared by the [employer] FDA and  has been authorized for detection and/or diagnosis of SARS-CoV-2 by FDA under an Emergency Use Authorization (EUA). This EUA will remain  in effect (meaning this  test can be used) for the duration of the COVID-19 declaration under Section 564(b)(1) of the Act, 21 U.S.C.section 360bbb-3(b)(1), unless the authorization is terminated  or revoked sooner.       Influenza A by PCR NEGATIVE NEGATIVE Final   Influenza B by PCR NEGATIVE NEGATIVE Final    Comment: (NOTE) The Xpert Xpress SARS-CoV-2/FLU/RSV plus assay is intended as an aid in the diagnosis of influenza from Nasopharyngeal swab specimens and should not be used as a sole basis for treatment. Nasal washings and aspirates are unacceptable for Xpert Xpress SARS-CoV-2/FLU/RSV testing.  Fact Sheet for Patients: [employer]com  Fact Sheet for Healthcare Providers: Serious[employer]ker.it  This test is not yet approved or cleared by the [place] FDA and has been authorized for detection and/or diagnosis of SARS-CoV-2 by FDA under an Emergency Use Authorization (EUA). This EUA will remain in effect (meaning this test can be used) for the duration of the COVID-19 declaration under Section 564(b)(1) of the Act, 21 U.S.C. section 360bbb-3(b)(1), unless the authorization is terminated or revoked.  Performed at [facility], [address] [place], [place] [address]     IMAGING RESULTS: I have personally reviewed the films ? Impression/Recommendation ? HIV with very high CD4 and undetectable viral load in 2021.  He has not been in care for the past year or so.  Used to be on Delstrigo.  Not taken it since the last 7 months.  We will check viral load and CD4 and restart  medication.  He gets his care at [facility].  Has been noncompliant for the past 7 months.  We will contact them to see whether they can see him ASAP after discharge.  Schizoaffective disorder.  He has been evaluated by psychiatrist for hearing voices and suicidal ideation. ? ___________________________________________________ Discussed with patient, requesting provider Note:  This document was prepared using Dragon voice recognition software and may include unintentional dictation errors.

## 2021-01-18 NOTE — ED Notes (Signed)
Pt provided with dinner tray at this time.

## 2021-01-18 NOTE — BH Assessment (Signed)
PATIENT BED AVAILABLE AFTER 9AM ON 01/19/21  Patient has been accepted to [facility].  Patient assigned to [facility] Accepting physician is Dr. Otho Perl.  Call report to [phone].  Representative was [employer].   ER Staff is aware of it:  [facility] ER Secretary  Dr. Larinda Buttery, ER MD  Elana Patient's Nurse

## 2021-01-18 NOTE — ED Provider Notes (Signed)
Charles Charles Emergency Department Provider Note    Event Date/Time   First MD Initiated Contact with Patient 01/18/21 [phone]     (approximate)  I have reviewed the triage vital signs and the nursing notes.   HISTORY  Chief Complaint No chief complaint on file.    HPI Charles Charles is a 60 y.o. male below listed past medical history presents to the ER for hearing voices and having thoughts of harming himself.  Does not have a plan.  States he been off his medications for several weeks.  States he has been smoking crack.  States he gets this way when he is off his medications.  Does not have an active plan.  He is here voluntarily  Past Medical History:  Diagnosis Date   GERD (gastroesophageal reflux disease)    Headache    Schizo-affective schizophrenia (HCC)    Family History  Problem Relation Age of Onset   Anxiety disorder Mother    Diabetes Mother    Diabetes Father    Past Surgical History:  Procedure Laterality Date   COLONOSCOPY WITH PROPOFOL N/A 08/04/2015   Procedure: COLONOSCOPY WITH PROPOFOL;  Surgeon: Midge Minium, MD;  Location: [facility];  Service: Endoscopy;  Laterality: N/A;   INCISION AND DRAINAGE PERIRECTAL ABSCESS N/A 07/14/2015   Procedure: IRRIGATION AND DEBRIDEMENT PERIRECTAL ABSCESS;  Surgeon: Lattie Haw, MD;  Location: [facility];  Service: General;  Laterality: N/A;   INCISION AND DRAINAGE PERIRECTAL ABSCESS N/A 08/14/2015   Procedure: IRRIGATION AND DEBRIDEMENT PERIRECTAL ABSCESS;  Surgeon: Gladis Riffle, MD;  Location: [facility];  Service: General;  Laterality: N/A;   Patient Active Problem List   Diagnosis Date Noted   Schizophrenia (HCC) 06/04/2016   Noncompliance 05/06/2016   Cannabis use disorder, moderate, dependence (HCC) 05/06/2016   Chest pain 04/14/2016   Abdominal pain, epigastric 04/14/2016   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 Authorizing Provider  aspirin 81 MG EC tablet Take 1 tablet (81 mg total) by mouth daily. 06/06/16   [name], Jolanta B, MD  benztropine (COGENTIN) 1 MG tablet Take 1 tablet (1 mg total) by mouth daily. 06/06/16 06/06/17  [name], Ellin Goodie, MD  Doravirin-Lamivudin-Tenofov DF (DELSTRIGO PO) Take by mouth.    [provider]  famotidine (PEPCID) 20 MG tablet Take 1 tablet (20 mg total) by mouth 2 (two) times daily. 06/06/16   [name], Jolanta B, MD  haloperidol decanoate (HALDOL DECANOATE) 100 MG/ML injection Inject 1 mL (100 mg total) into the muscle every 30 (thirty) days. 07/02/16   [name], Braulio Conte B, MD  ibuprofen (ADVIL,MOTRIN) 600 MG tablet Take 1 tablet (600 mg total) by mouth every 6 (six) hours as needed. 06/06/16   [name], Braulio Conte B, MD  isoniazid (NYDRAZID) 300 MG tablet Take 1 tablet (300 mg total) by mouth daily. 03/29/20   Delfino Lovett, MD  levothyroxine (SYNTHROID, LEVOTHROID) 50 MCG tablet Take 1 tablet (50 mcg total) by mouth daily before breakfast. 06/06/16   [name], Jolanta B, MD  traZODone (DESYREL) 50 MG tablet Take 1  tablet (50 mg total) by mouth at bedtime. 06/06/16   [name], Ellin GoodieJolanta B, MD    Allergies Penicillins and Sulfa antibiotics    Social History Social History   Tobacco Use   Smoking status: Every Day    Packs/day: 2.00    Types: Cigarettes   Smokeless tobacco: Never   Tobacco comments:    (1-2 cigs/day)  Substance Use Topics   Alcohol use: Yes    Alcohol/week: 56.0 standard drinks    Types: 56 Cans of beer per week     Comment: 2 x 40's daily - pt says no alcohol for several weeks   Drug use: Yes    Types: Cocaine, Marijuana    Comment: back in the days    Review of Systems Patient denies headaches, rhinorrhea, blurry vision, numbness, shortness of breath, chest pain, edema, cough, abdominal pain, nausea, vomiting, diarrhea, dysuria, fevers, rashes or hallucinations unless otherwise stated above in HPI. ____________________________________________   PHYSICAL EXAM:  VITAL SIGNS: Vitals:   01/18/21 0720  BP: 102/71  Pulse: 61  Resp: 18  Temp: 97.9 F (36.6 C)  SpO2: 99%    Constitutional: Alert and oriented.  Eyes: Conjunctivae are normal.  Head: Atraumatic. Nose: No congestion/rhinnorhea. Mouth/Throat: Mucous membranes are moist.   Neck: No stridor. Painless ROM.  Cardiovascular: Normal rate, regular rhythm. Grossly normal heart sounds.  Good peripheral circulation. Respiratory: Normal respiratory effort.  No retractions. Lungs CTAB. Gastrointestinal: Soft and nontender. No distention. No abdominal bruits. No CVA tenderness. Genitourinary:  Musculoskeletal: No lower extremity tenderness nor edema.  No joint effusions. Neurologic:  Normal speech and language. No gross focal neurologic deficits are appreciated. No facial droop Skin:  Skin is warm, dry and intact. No rash noted. Psychiatric: calm and cooperative. Speech and behavior are normal.  ____________________________________________   LABS (all labs ordered are listed, but only abnormal results are displayed)  Results for orders placed or performed during the hospital encounter of 01/18/21 (from the past 24 hour(s))  Comprehensive metabolic panel     Status: Abnormal   Collection Time: 01/18/21  7:26 AM  Result Value Ref Range   Sodium 139 135 - 145 mmol/L   Potassium 3.9 3.5 - 5.1 mmol/L   Chloride 101 98 - 111 mmol/L   CO2 27 22 - 32 mmol/L   Glucose, Bld 114 (H) 70 - 99 mg/dL   BUN 14 8 - 23 mg/dL   Creatinine, Ser 6.041.08  0.61 - 1.24 mg/dL   Calcium 9.5 8.9 - 54.010.3 mg/dL   Total Protein 7.4 6.5 - 8.1 g/dL   Albumin 3.4 (L) 3.5 - 5.0 g/dL   AST 22 15 - 41 U/L   ALT 19 0 - 44 U/L   Alkaline Phosphatase 62 38 - 126 U/L   Total Bilirubin 0.7 0.3 - 1.2 mg/dL   GFR, Estimated >98>60 >11>60 mL/min   Anion gap 11 5 - 15  Ethanol     Status: None   Collection Time: 01/18/21  7:26 AM  Result Value Ref Range   Alcohol, Ethyl (B) <10 <10 mg/dL  Salicylate level     Status: Abnormal   Collection Time: 01/18/21  7:26 AM  Result Value Ref Range   Salicylate Lvl <7.0 (L) 7.0 - 30.0 mg/dL  Acetaminophen level     Status: Abnormal   Collection Time: 01/18/21  7:26 AM  Result Value Ref Range   Acetaminophen (Tylenol), Serum <10 (L) 10 - 30 ug/mL  cbc     Status:  None   Collection Time: 01/18/21  7:26 AM  Result Value Ref Range   WBC 5.3 4.0 - 10.5 K/uL   RBC 4.83 4.22 - 5.81 MIL/uL   Hemoglobin 15.8 13.0 - 17.0 g/dL   HCT 51.8 84.1 - 66.0 %   MCV 93.6 80.0 - 100.0 fL   MCH 32.7 26.0 - 34.0 pg   MCHC 35.0 30.0 - 36.0 g/dL   RDW 63.0 16.0 - 10.9 %   Platelets 355 150 - 400 K/uL   nRBC 0.0 0.0 - 0.2 %  Urine Drug Screen, Qualitative     Status: Abnormal   Collection Time: 01/18/21  7:26 AM  Result Value Ref Range   Tricyclic, Ur Screen NONE DETECTED NONE DETECTED   Amphetamines, Ur Screen NONE DETECTED NONE DETECTED   MDMA (Ecstasy)Ur Screen NONE DETECTED NONE DETECTED   Cocaine Metabolite,Ur [place] POSITIVE (A) NONE DETECTED   Opiate, Ur Screen NONE DETECTED NONE DETECTED   Phencyclidine (PCP) Ur S NONE DETECTED NONE DETECTED   Cannabinoid 50 Ng, Ur [place] POSITIVE (A) NONE DETECTED   Barbiturates, Ur Screen NONE DETECTED NONE DETECTED   Benzodiazepine, Ur Scrn NONE DETECTED NONE DETECTED   Methadone Scn, Ur NONE DETECTED NONE DETECTED    ____________________________________________  ____________________________________________  RADIOLOGY   ____________________________________________   PROCEDURES  Procedure(s) performed:  Procedures    Critical Care performed: no ____________________________________________   INITIAL IMPRESSION / ASSESSMENT AND PLAN / ED COURSE  Pertinent labs & imaging results that were available during my care of the patient were reviewed by me and considered in my medical decision making (see chart for details).   DDX: Psychosis, delirium, medication effect, noncompliance, polysubstance abuse, Si, Hi, depression   Charles Charles is a 60 y.o. who presents to the ED with for evaluation of avh and SI.  Patient has psych history of schizophrenia.  Laboratory testing was ordered to evaluation for underlying electrolyte derangement or signs of underlying organic pathology to explain today's presentation.  Based on history and physical and laboratory evaluation, it appears that the patient's presentation is 2/2 underlying psychiatric disorder and will require further evaluation and management by inpatient psychiatry.   Disposition pending psychiatric evaluation.  The patient has been placed in psychiatric observation due to the need to provide a safe environment for the patient while obtaining psychiatric consultation and evaluation, as well as ongoing medical and medication management to treat the patient's condition.  The patient has been placed under full IVC at this time.     The patient was evaluated in Emergency Department today for the symptoms described in the history of present illness. He/she was evaluated in the context of the global COVID-19 pandemic, which necessitated consideration that the patient might be at risk for infection with the SARS-CoV-2 virus that causes COVID-19. Institutional protocols and algorithms that pertain to the evaluation of patients at risk for COVID-19 are in a  state of rapid change based on information released by regulatory bodies including the CDC and federal and state organizations. These policies and algorithms were followed during the patient's care in the ED.  As part of my medical decision making, I reviewed the following data within the electronic MEDICAL RECORD NUMBER Nursing notes reviewed and incorporated, Labs reviewed, notes from prior ED visits and [place] Controlled Substance Database   ____________________________________________   FINAL CLINICAL IMPRESSION(S) / ED DIAGNOSES  Final diagnoses:  Hallucinations  Suicidal ideation      NEW MEDICATIONS STARTED DURING THIS VISIT:  New  Prescriptions   No medications on file     Note:  This document was prepared using Dragon voice recognition software and may include unintentional dictation errors.    Willy Eddy, MD 01/18/21 [phone]

## 2021-01-18 NOTE — ED Notes (Signed)
Spoke with Aurther Loft from [place], pt. Will have bed avail 01/19/21 after 9.  Facility: [facility] Call Rpt. [phone] Admitting MD: Annia Belt Attending: Otho Perl

## 2021-01-18 NOTE — ED Notes (Signed)
Ginger ale and graham crackers provided to pt at this time.

## 2021-01-19 NOTE — ED Notes (Signed)
Attempted to call report to [facility] without success.

## 2021-01-19 NOTE — ED Notes (Signed)
Report received from Elana, RN. Pt resting quietly in recliner with eyes closed. Respirations even and unlabored. No S/S of distress noted.  

## 2021-01-19 NOTE — ED Notes (Signed)
Called [facility] to transport to [facility]  spoke to [phone]

## 2021-01-19 NOTE — ED Notes (Signed)
Attempted to call report to [facility] for second time without success.

## 2021-01-19 NOTE — ED Notes (Signed)
VOL/ Accepted to [facility]

## 2021-02-06 ENCOUNTER — Other Ambulatory Visit: Payer: Self-pay

## 2021-02-06 ENCOUNTER — Emergency Department: Payer: Medicaid Other

## 2021-02-06 ENCOUNTER — Emergency Department
Admission: EM | Admit: 2021-02-06 | Discharge: 2021-02-06 | Disposition: A | Discharge status: Home / Self Care | Payer: Medicaid Other | Attending: Emergency Medicine | Admitting: Emergency Medicine

## 2021-02-06 ENCOUNTER — Encounter: Payer: Self-pay | Admitting: Emergency Medicine

## 2021-02-06 DIAGNOSIS — W19XXXA Unspecified fall, initial encounter: Secondary | ICD-10-CM | POA: Insufficient documentation

## 2021-02-06 DIAGNOSIS — Z7982 Long term (current) use of aspirin: Secondary | ICD-10-CM | POA: Insufficient documentation

## 2021-02-06 DIAGNOSIS — M546 Pain in thoracic spine: Secondary | ICD-10-CM | POA: Diagnosis not present

## 2021-02-06 DIAGNOSIS — F17210 Nicotine dependence, cigarettes, uncomplicated: Secondary | ICD-10-CM | POA: Insufficient documentation

## 2021-02-06 DIAGNOSIS — Y92009 Unspecified place in unspecified non-institutional (private) residence as the place of occurrence of the external cause: Secondary | ICD-10-CM | POA: Insufficient documentation

## 2021-02-06 DIAGNOSIS — S299XXA Unspecified injury of thorax, initial encounter: Secondary | ICD-10-CM | POA: Diagnosis present

## 2021-02-06 DIAGNOSIS — S20211A Contusion of right front wall of thorax, initial encounter: Secondary | ICD-10-CM | POA: Insufficient documentation

## 2021-02-06 DIAGNOSIS — E039 Hypothyroidism, unspecified: Secondary | ICD-10-CM | POA: Diagnosis not present

## 2021-02-06 DIAGNOSIS — Z79899 Other long term (current) drug therapy: Secondary | ICD-10-CM | POA: Diagnosis not present

## 2021-02-06 DIAGNOSIS — R52 Pain, unspecified: Secondary | ICD-10-CM

## 2021-02-06 MED ORDER — LIDOCAINE 5 % EX PTCH: 1.0000 | MEDICATED_PATCH | Freq: Two times a day (BID) | CUTANEOUS | 0 refills | Status: AC | PRN

## 2021-02-06 MED ORDER — CYCLOBENZAPRINE HCL 10 MG PO TABS
10.0000 mg | ORAL_TABLET | Freq: Once | ORAL | Status: AC
  Administered 2021-02-06: 10 mg via ORAL
  Filled 2021-02-06: qty 1

## 2021-02-06 MED ORDER — LIDOCAINE 5 % EX PTCH
1.0000 | MEDICATED_PATCH | Freq: Once | CUTANEOUS | Status: DC
  Administered 2021-02-06: 1 via TRANSDERMAL
  Filled 2021-02-06: qty 1

## 2021-02-06 MED ORDER — CYCLOBENZAPRINE HCL 5 MG PO TABS: 5.0000 mg | ORAL_TABLET | Freq: Three times a day (TID) | ORAL | 0 refills | Status: DC | PRN

## 2021-02-06 NOTE — Discharge Instructions (Addendum)
Take the prescription meds as directed. Follow-up with your primary provider as needed. Apply ice and/or moist heat for pain.

## 2021-02-06 NOTE — ED Notes (Signed)
See triage note  Presents with pain to back/rib area for 1 day  States he fell but not sure what he hit  Pain increases with movement and cough

## 2021-02-06 NOTE — ED Triage Notes (Signed)
C/O right thoracic back pain x 1 day.  Hurts worse with cough and deep breathing.

## 2021-02-06 NOTE — ED Provider Notes (Signed)
[facility] Emergency Department Provider Note ____________________________________________  Time seen: 1012  I have reviewed the triage vital signs and the nursing notes.  HISTORY  Chief Complaint  Back Pain   HPI Charles Charles is a 60 y.o. male with the below medical history including schizophrenia, GERD, and alcohol abuse, presents to the ED for thoracic back pain.  Patient describes 1 day of back pain without preceding injury, trauma, or fall.  Also denies any recent cough, congestion, chest pain, or shortness of breath.  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   Alcohol use disorder, moderate, dependence (HCC) 04/10/2016   GERD (gastroesophageal reflux disease) 04/10/2016   Tobacco use disorder 04/10/2016   Suicidal ideation 04/09/2016   Stimulant abuse (HCC) 12/14/2014   Cocaine use disorder, moderate, dependence (HCC) 12/17/2013   Adult hypothyroidism 12/17/2013   Blurred vision 12/17/2013   Congenital hiatus hernia 10/01/2010    Past Surgical History:  Procedure Laterality Date   COLONOSCOPY WITH PROPOFOL N/A 08/04/2015   Procedure: COLONOSCOPY WITH PROPOFOL;  Surgeon: Midge Minium, MD;  Location: [facility];  Service: Endoscopy;  Laterality: N/A;   INCISION AND DRAINAGE PERIRECTAL ABSCESS N/A 07/14/2015   Procedure: IRRIGATION AND DEBRIDEMENT PERIRECTAL ABSCESS;  Surgeon: Lattie Haw, MD;  Location: [facility];  Service: General;  Laterality: N/A;   INCISION AND DRAINAGE PERIRECTAL ABSCESS N/A 08/14/2015   Procedure: IRRIGATION  AND DEBRIDEMENT PERIRECTAL ABSCESS;  Surgeon: Gladis Riffle, MD;  Location: [facility];  Service: General;  Laterality: N/A;    Prior to Admission medications   Medication Sig Start Date End Date Taking? Authorizing Provider  cyclobenzaprine (FLEXERIL) 5 MG tablet Take 1 tablet (5 mg total) by mouth 3 (three) times daily as needed. 02/06/21  Yes Ming Kunka, Charlesetta Ivory, PA-C  lidocaine (LIDODERM) 5 % Place 1 patch onto the skin every 12 (twelve) hours as needed for up to 10 days. Remove & Discard patch after 12 hours of wear each day. 02/06/21 02/16/21 Yes Jazmarie Biever, Charlesetta Ivory, PA-C  aspirin 81 MG EC tablet Take 1 tablet (81 mg total) by mouth daily. Patient not taking: No sig reported 06/06/16   [name], Jolanta B, MD  benztropine (COGENTIN) 1 MG tablet Take 1 tablet (1 mg total) by mouth daily. 06/06/16 06/06/17  [name], Ellin Goodie, MD  Doravirin-Lamivudin-Tenofov DF (DELSTRIGO PO) Take by mouth. Patient not taking: No sig reported    [provider]  famotidine (PEPCID) 20 MG tablet Take 1 tablet (20 mg total) by mouth 2 (two) times daily. Patient not taking: No sig reported 06/06/16   [name], Jolanta B, MD  haloperidol decanoate (HALDOL DECANOATE) 100 MG/ML injection Inject 1 mL (100 mg total) into the muscle every 30 (thirty) days. Patient not taking: No sig reported 07/02/16   [name], Jolanta B, MD  ibuprofen (ADVIL,MOTRIN) 600 MG tablet Take 1 tablet (600 mg total) by mouth every 6 (six) hours as needed. Patient not taking: No sig reported 06/06/16   [name], Jolanta B, MD  isoniazid (NYDRAZID) 300 MG tablet Take 1 tablet (300 mg total) by mouth daily.  Patient not taking: No sig reported 03/29/20   Delfino Lovett, MD  levothyroxine (SYNTHROID, LEVOTHROID) 50 MCG tablet Take 1 tablet (50 mcg total) by mouth daily before breakfast. Patient not taking: No sig reported 06/06/16   [name], Jolanta B, MD  traZODone (DESYREL) 50 MG tablet Take 1 tablet (50 mg total) by mouth  at bedtime. Patient not taking: No sig reported 06/06/16   [name], Braulio Conte B, MD    Allergies Penicillins and Sulfa antibiotics  Family History  Problem Relation Age of Onset   Anxiety disorder Mother    Diabetes Mother    Diabetes Father     Social History Social History   Tobacco Use   Smoking status: Every Day    Packs/day: 2.00    Types: Cigarettes   Smokeless tobacco: Never   Tobacco comments:    (1-2 cigs/day)  Substance Use Topics   Alcohol use: Yes    Alcohol/week: 56.0 standard drinks    Types: 56 Cans of beer per week    Comment: 2 x 40's daily - pt says no alcohol for several weeks   Drug use: Yes    Types: Cocaine, Marijuana    Comment: back in the days    Review of Systems  Constitutional: Negative for fever. Eyes: Negative for visual changes. ENT: Negative for sore throat. Cardiovascular: Negative for chest pain. Respiratory: Negative for shortness of breath. Gastrointestinal: Negative for abdominal pain, vomiting and diarrhea. Genitourinary: Negative for dysuria. Musculoskeletal: Negative for back pain. Skin: Negative for rash. Neurological: Negative for headaches, focal weakness or numbness. ____________________________________________  PHYSICAL EXAM:  VITAL SIGNS: ED Triage Vitals  Enc Vitals Group     BP 02/06/21 0912 109/62     Pulse Rate 02/06/21 0912 72     Resp 02/06/21 0912 16     Temp 02/06/21 0912 98.1 F (36.7 C)     Temp Source 02/06/21 0912 Oral     SpO2 02/06/21 0912 97 %     Weight 02/06/21 0911 188 lb 0.8 oz (85.3 kg)     Height 02/06/21 0911 5\' 3"  (1.6 m)     Head Circumference --      Peak Flow --      Pain Score 02/06/21 0910 9     Pain Loc --      Pain Edu? --      Excl. in GC? --     Constitutional: Alert and oriented. Well appearing and in no distress. Head: Normocephalic and atraumatic. Eyes: Conjunctivae are normal. Normal extraocular movements Cardiovascular: Normal rate, regular rhythm. Normal distal  pulses. Respiratory: Normal respiratory effort. No wheezes/rales/rhonchi.  No deformity or dislocation, or ecchymosis noted to the right lateral chest wall.  Patient tender to palpation to the lateral and posterior rib cage. Gastrointestinal: Soft and nontender. No distention. Musculoskeletal: Nontender with normal range of motion in all extremities.  Neurologic:  Normal gait without ataxia. Normal speech and language. No gross focal neurologic deficits are appreciated. Skin:  Skin is warm, dry and intact. No rash noted. Psychiatric: Mood and affect are normal. Patient exhibits appropriate insight and judgment. ____________________________________________    {LABS (pertinent positives/negatives)  ____________________________________________  {EKG  ____________________________________________   RADIOLOGY Official radiology report(s): DG Ribs Unilateral W/Chest Right  Result Date: 02/06/2021 CLINICAL DATA:  60 year old male with right posterior rib pain after fall. EXAM: RIGHT RIBS AND CHEST - 3+ VIEW COMPARISON:  Chest radiographs 02/24/2020 and earlier. FINDINGS: Stable somewhat low lung volumes. Normal cardiac size and mediastinal contours.  Visualized tracheal air column is within normal limits. No pneumothorax. Mild elevation of the right hemidiaphragm with subtle atelectasis at the right lung base. Negative visible bowel gas pattern. Two oblique views of the right ribs. Rib marker in place at the posterolateral 11th rib level. Bone mineralization is within normal limits. No rib fracture is identified. Other visible osseous structures appear to be intact. IMPRESSION: Mild right lung base atelectasis but no right rib fracture identified radiographically. Electronically Signed   By: Odessa Fleming M.D.   On: 02/06/2021 10:56   ____________________________________________  PROCEDURES  Cyclobenzaprine 10 mg p.o. Lidoderm patch 5%  topical  Procedures ____________________________________________   INITIAL IMPRESSION / ASSESSMENT AND PLAN / ED COURSE  As part of my medical decision making, I reviewed the following data within the electronic MEDICAL RECORD NUMBER Radiograph reviewed WNL and Notes from prior ED visits    DDX: rib fracture, chest wall contusion, muscle strain    Patient ED evaluation of acute right-sided chest wall pain after mechanical fall.  He was evaluated for his complaint, and found to have a negative chest x-ray without any signs of acute rib fracture or intrathoracic process.  Patient will be discharged with a prescription for cyclobenzaprine as well as Lidoderm patches.  He will follow-up with primary provider or return to the ED if needed.  Charles Charles was evaluated in Emergency Department on 02/06/2021 for the symptoms described in the history of present illness. He was evaluated in the context of the global COVID-19 pandemic, which necessitated consideration that the patient might be at risk for infection with the SARS-CoV-2 virus that causes COVID-19. Institutional protocols and algorithms that pertain to the evaluation of patients at risk for COVID-19 are in a state of rapid change based on information released by regulatory bodies including the CDC and federal and state organizations. These policies and algorithms were followed during the patient's care in the ED. ____________________________________________  FINAL CLINICAL IMPRESSION(S) / ED DIAGNOSES  Final diagnoses:  Fall at home, initial encounter  Chest wall contusion, right, initial encounter      Lissa Hoard, PA-C 02/06/21 1727    Shaune Pollack, MD 02/07/21 [phone]

## 2022-01-05 ENCOUNTER — Inpatient Hospital Stay
Admit: 2022-01-05 | Discharge: 2022-01-05 | Disposition: A | Discharge status: Home / Self Care | Payer: Medicaid Other | Attending: Critical Care Medicine | Admitting: Critical Care Medicine

## 2022-01-05 ENCOUNTER — Encounter: Payer: Self-pay | Admitting: Internal Medicine

## 2022-01-05 ENCOUNTER — Emergency Department: Payer: Medicaid Other

## 2022-01-05 ENCOUNTER — Inpatient Hospital Stay: Payer: Self-pay

## 2022-01-05 ENCOUNTER — Inpatient Hospital Stay
Admission: EM | Admit: 2022-01-05 | Discharge: 2022-01-07 | DRG: 917 | Disposition: A | Discharge status: Home / Self Care | Payer: Medicaid Other | Attending: Hospitalist | Admitting: Hospitalist

## 2022-01-05 DIAGNOSIS — G928 Other toxic encephalopathy: Secondary | ICD-10-CM | POA: Diagnosis present

## 2022-01-05 DIAGNOSIS — J9601 Acute respiratory failure with hypoxia: Secondary | ICD-10-CM | POA: Diagnosis present

## 2022-01-05 DIAGNOSIS — E876 Hypokalemia: Secondary | ICD-10-CM | POA: Diagnosis present

## 2022-01-05 DIAGNOSIS — Z79890 Hormone replacement therapy (postmenopausal): Secondary | ICD-10-CM | POA: Diagnosis not present

## 2022-01-05 DIAGNOSIS — F17210 Nicotine dependence, cigarettes, uncomplicated: Secondary | ICD-10-CM | POA: Diagnosis present

## 2022-01-05 DIAGNOSIS — Z882 Allergy status to sulfonamides status: Secondary | ICD-10-CM | POA: Diagnosis not present

## 2022-01-05 DIAGNOSIS — Z7151 Drug abuse counseling and surveillance of drug abuser: Secondary | ICD-10-CM | POA: Diagnosis not present

## 2022-01-05 DIAGNOSIS — F259 Schizoaffective disorder, unspecified: Secondary | ICD-10-CM | POA: Diagnosis present

## 2022-01-05 DIAGNOSIS — B20 Human immunodeficiency virus [HIV] disease: Secondary | ICD-10-CM | POA: Diagnosis not present

## 2022-01-05 DIAGNOSIS — T50901A Poisoning by unspecified drugs, medicaments and biological substances, accidental (unintentional), initial encounter: Secondary | ICD-10-CM | POA: Diagnosis not present

## 2022-01-05 DIAGNOSIS — Z79899 Other long term (current) drug therapy: Secondary | ICD-10-CM | POA: Diagnosis not present

## 2022-01-05 DIAGNOSIS — T50904A Poisoning by unspecified drugs, medicaments and biological substances, undetermined, initial encounter: Secondary | ICD-10-CM | POA: Diagnosis present

## 2022-01-05 DIAGNOSIS — F1410 Cocaine abuse, uncomplicated: Secondary | ICD-10-CM | POA: Diagnosis present

## 2022-01-05 DIAGNOSIS — Z880 Allergy status to penicillin: Secondary | ICD-10-CM

## 2022-01-05 DIAGNOSIS — K219 Gastro-esophageal reflux disease without esophagitis: Secondary | ICD-10-CM | POA: Diagnosis present

## 2022-01-05 DIAGNOSIS — E039 Hypothyroidism, unspecified: Secondary | ICD-10-CM | POA: Diagnosis not present

## 2022-01-05 DIAGNOSIS — Z91148 Patient's other noncompliance with medication regimen for other reason: Secondary | ICD-10-CM

## 2022-01-05 DIAGNOSIS — F1010 Alcohol abuse, uncomplicated: Secondary | ICD-10-CM | POA: Diagnosis not present

## 2022-01-05 DIAGNOSIS — Z8615 Personal history of latent tuberculosis infection: Secondary | ICD-10-CM

## 2022-01-05 DIAGNOSIS — Z7982 Long term (current) use of aspirin: Secondary | ICD-10-CM | POA: Diagnosis not present

## 2022-01-05 DIAGNOSIS — Z21 Asymptomatic human immunodeficiency virus [HIV] infection status: Secondary | ICD-10-CM | POA: Diagnosis present

## 2022-01-05 DIAGNOSIS — F1020 Alcohol dependence, uncomplicated: Secondary | ICD-10-CM | POA: Diagnosis not present

## 2022-01-05 DIAGNOSIS — Z79891 Long term (current) use of opiate analgesic: Secondary | ICD-10-CM | POA: Diagnosis not present

## 2022-01-05 DIAGNOSIS — E870 Hyperosmolality and hypernatremia: Secondary | ICD-10-CM | POA: Diagnosis present

## 2022-01-05 HISTORY — DX: Poisoning by unspecified drugs, medicaments and biological substances, accidental (unintentional), initial encounter: T50.901A

## 2022-01-05 LAB — CBC WITH DIFFERENTIAL/PLATELET
Abs Immature Granulocytes: 0.07 10*3/uL (ref 0.00–0.07)
Basophils Absolute: 0 10*3/uL (ref 0.0–0.1)
Basophils Relative: 0 %
Eosinophils Absolute: 0 10*3/uL (ref 0.0–0.5)
Eosinophils Relative: 0 %
HCT: 44.5 % (ref 39.0–52.0)
Hemoglobin: 15.2 g/dL (ref 13.0–17.0)
Immature Granulocytes: 1 %
Lymphocytes Relative: 15 %
Lymphs Abs: 1.5 10*3/uL (ref 0.7–4.0)
MCH: 31.9 pg (ref 26.0–34.0)
MCHC: 34.2 g/dL (ref 30.0–36.0)
MCV: 93.3 fL (ref 80.0–100.0)
Monocytes Absolute: 0.9 10*3/uL (ref 0.1–1.0)
Monocytes Relative: 10 %
Neutro Abs: 7.2 10*3/uL (ref 1.7–7.7)
Neutrophils Relative %: 74 %
Platelets: 292 10*3/uL (ref 150–400)
RBC: 4.77 MIL/uL (ref 4.22–5.81)
RDW: 13.5 % (ref 11.5–15.5)
WBC: 9.8 10*3/uL (ref 4.0–10.5)
nRBC: 0 % (ref 0.0–0.2)

## 2022-01-05 LAB — BASIC METABOLIC PANEL
Anion gap: 13 (ref 5–15)
BUN: 12 mg/dL (ref 8–23)
CO2: 23 mmol/L (ref 22–32)
Calcium: 9.1 mg/dL (ref 8.9–10.3)
Chloride: 107 mmol/L (ref 98–111)
Creatinine, Ser: 0.98 mg/dL (ref 0.61–1.24)
GFR, Estimated: >60 mL/min (ref >60)
Glucose, Bld: 100 mg/dL — ABNORMAL HIGH (ref 70–99)
Potassium: 3.6 mmol/L (ref 3.5–5.1)
Sodium: 143 mmol/L (ref 135–145)

## 2022-01-05 LAB — URINALYSIS, ROUTINE W REFLEX MICROSCOPIC
Bacteria, UA: NONE SEEN
Bilirubin Urine: NEGATIVE
Glucose, UA: NEGATIVE mg/dL
Ketones, ur: NEGATIVE mg/dL
Leukocytes,Ua: NEGATIVE
Nitrite: NEGATIVE
Protein, ur: NEGATIVE mg/dL
Specific Gravity, Urine: 1.013 (ref 1.005–1.030)
Squamous Epithelial / HPF: NONE SEEN (ref 0–5)
pH: 5 (ref 5.0–8.0)

## 2022-01-05 LAB — URINE DRUG SCREEN, QUALITATIVE (ARMC ONLY)
Amphetamines, Ur Screen: NOT DETECTED
Barbiturates, Ur Screen: NOT DETECTED
Benzodiazepine, Ur Scrn: POSITIVE — AB
Cannabinoid 50 Ng, Ur ~~LOC~~: POSITIVE — AB
Cocaine Metabolite,Ur ~~LOC~~: POSITIVE — AB
MDMA (Ecstasy)Ur Screen: NOT DETECTED
Methadone Scn, Ur: NOT DETECTED
Opiate, Ur Screen: NOT DETECTED
Phencyclidine (PCP) Ur S: NOT DETECTED
Tricyclic, Ur Screen: NOT DETECTED

## 2022-01-05 LAB — COMPREHENSIVE METABOLIC PANEL
ALT: 30 U/L (ref 0–44)
AST: 34 U/L (ref 15–41)
Albumin: 3.7 g/dL (ref 3.5–5.0)
Alkaline Phosphatase: 55 U/L (ref 38–126)
Anion gap: 10 (ref 5–15)
BUN: 13 mg/dL (ref 8–23)
CO2: 26 mmol/L (ref 22–32)
Calcium: 9.2 mg/dL (ref 8.9–10.3)
Chloride: 102 mmol/L (ref 98–111)
Creatinine, Ser: 1.25 mg/dL — ABNORMAL HIGH (ref 0.61–1.24)
GFR, Estimated: >60 mL/min (ref >60)
Glucose, Bld: 144 mg/dL — ABNORMAL HIGH (ref 70–99)
Potassium: 3.4 mmol/L — ABNORMAL LOW (ref 3.5–5.1)
Sodium: 138 mmol/L (ref 135–145)
Total Bilirubin: 0.7 mg/dL (ref 0.3–1.2)
Total Protein: 7.6 g/dL (ref 6.5–8.1)

## 2022-01-05 LAB — GLUCOSE, CAPILLARY
Glucose-Capillary: 113 mg/dL — ABNORMAL HIGH (ref 70–99)
Glucose-Capillary: 99 mg/dL (ref 70–99)
Glucose-Capillary: 105 mg/dL — ABNORMAL HIGH (ref 70–99)
Glucose-Capillary: 110 mg/dL — ABNORMAL HIGH (ref 70–99)

## 2022-01-05 LAB — TROPONIN I (HIGH SENSITIVITY): Troponin I (High Sensitivity): 5 ng/L (ref <18)

## 2022-01-05 LAB — ECHOCARDIOGRAM COMPLETE
AR max vel: 3.01 cm2
AV Peak grad: 3.4 mmHg
Ao pk vel: 0.92 m/s
Area-P 1/2: 3.68 cm2
S' Lateral: 2.59 cm
Single Plane A4C EF: 60.9 %
Weight: 2419.77 oz

## 2022-01-05 LAB — ACETAMINOPHEN LEVEL: Acetaminophen (Tylenol), Serum: <10 ug/mL — ABNORMAL LOW (ref 10–30)

## 2022-01-05 LAB — ETHANOL: Alcohol, Ethyl (B): <10 mg/dL (ref <10)

## 2022-01-05 LAB — CBG MONITORING, ED: Glucose-Capillary: 137 mg/dL — ABNORMAL HIGH (ref 70–99)

## 2022-01-05 LAB — MRSA NEXT GEN BY PCR, NASAL: MRSA by PCR Next Gen: NOT DETECTED

## 2022-01-05 LAB — SALICYLATE LEVEL: Salicylate Lvl: <7 mg/dL — ABNORMAL LOW (ref 7.0–30.0)

## 2022-01-05 MED ORDER — MIDAZOLAM HCL 2 MG/2ML IJ SOLN
INTRAMUSCULAR | Status: AC
  Administered 2022-01-05: 2 mg
  Filled 2022-01-05: qty 2

## 2022-01-05 MED ORDER — ETOMIDATE 2 MG/ML IV SOLN
INTRAVENOUS | Status: AC
  Administered 2022-01-05: 20 mg
  Filled 2022-01-05: qty 20

## 2022-01-05 MED ORDER — ONDANSETRON HCL 4 MG/2ML IJ SOLN: 4.0000 mg | Freq: Four times a day (QID) | INTRAMUSCULAR | Status: DC | PRN

## 2022-01-05 MED ORDER — SODIUM CHLORIDE 0.9 % IV SOLN: INTRAVENOUS | Status: DC

## 2022-01-05 MED ORDER — MIDAZOLAM-SODIUM CHLORIDE 100-0.9 MG/100ML-% IV SOLN
0.5000 mg/h | INTRAVENOUS | Status: DC
  Administered 2022-01-05: 0.5 mg/h via INTRAVENOUS
  Filled 2022-01-05: qty 100

## 2022-01-05 MED ORDER — SODIUM CHLORIDE 0.9% FLUSH: 10.0000 mL | INTRAVENOUS | Status: DC | PRN

## 2022-01-05 MED ORDER — NOREPINEPHRINE 4 MG/250ML-% IV SOLN
2.0000 ug/min | INTRAVENOUS | Status: DC
  Administered 2022-01-06: 2 ug/min via INTRAVENOUS
  Filled 2022-01-05 (×2): qty 250

## 2022-01-05 MED ORDER — KETAMINE HCL 50 MG/5ML IJ SOSY
PREFILLED_SYRINGE | INTRAMUSCULAR | Status: AC
  Filled 2022-01-05: qty 5

## 2022-01-05 MED ORDER — MIDAZOLAM HCL 2 MG/2ML IJ SOLN
2.0000 mg | INTRAMUSCULAR | Status: DC | PRN
  Administered 2022-01-05 – 2022-01-06 (×2): 2 mg via INTRAVENOUS
  Filled 2022-01-05: qty 2

## 2022-01-05 MED ORDER — PROPOFOL 1000 MG/100ML IV EMUL
5.0000 ug/kg/min | INTRAVENOUS | Status: DC
  Administered 2022-01-05: 70 ug/kg/min via INTRAVENOUS
  Administered 2022-01-05: 55 ug/kg/min via INTRAVENOUS
  Administered 2022-01-05: 50 ug/kg/min via INTRAVENOUS
  Administered 2022-01-05: 65 ug/kg/min via INTRAVENOUS
  Administered 2022-01-05: 70 ug/kg/min via INTRAVENOUS
  Administered 2022-01-06: 40 ug/kg/min via INTRAVENOUS
  Administered 2022-01-06: 15 ug/kg/min via INTRAVENOUS
  Filled 2022-01-05 (×6): qty 100

## 2022-01-05 MED ORDER — DOCUSATE SODIUM 50 MG/5ML PO LIQD
100.0000 mg | Freq: Two times a day (BID) | ORAL | Status: DC
  Administered 2022-01-05 – 2022-01-06 (×2): 100 mg
  Filled 2022-01-05 (×2): qty 10

## 2022-01-05 MED ORDER — CHLORHEXIDINE GLUCONATE CLOTH 2 % EX PADS
6.0000 | MEDICATED_PAD | Freq: Every day | CUTANEOUS | Status: DC
  Administered 2022-01-05 – 2022-01-07 (×3): 6 via TOPICAL

## 2022-01-05 MED ORDER — NALOXONE HCL 2 MG/2ML IJ SOSY
PREFILLED_SYRINGE | INTRAMUSCULAR | Status: AC
  Administered 2022-01-05: 2 mg
  Filled 2022-01-05: qty 2

## 2022-01-05 MED ORDER — MIDAZOLAM HCL 2 MG/2ML IJ SOLN
INTRAMUSCULAR | Status: AC
  Filled 2022-01-05: qty 2

## 2022-01-05 MED ORDER — FENTANYL CITRATE PF 50 MCG/ML IJ SOSY: 50.0000 ug | PREFILLED_SYRINGE | Freq: Once | INTRAMUSCULAR | Status: DC

## 2022-01-05 MED ORDER — SUCCINYLCHOLINE CHLORIDE 200 MG/10ML IV SOSY
PREFILLED_SYRINGE | INTRAVENOUS | Status: AC
  Administered 2022-01-05: 100 mg
  Filled 2022-01-05: qty 10

## 2022-01-05 MED ORDER — ROCURONIUM BROMIDE 10 MG/ML (PF) SYRINGE
PREFILLED_SYRINGE | INTRAVENOUS | Status: AC
  Filled 2022-01-05: qty 10

## 2022-01-05 MED ORDER — SODIUM CHLORIDE 0.9 % IV BOLUS
1000.0000 mL | Freq: Once | INTRAVENOUS | Status: AC
  Administered 2022-01-05: 1000 mL via INTRAVENOUS

## 2022-01-05 MED ORDER — FENTANYL BOLUS VIA INFUSION
50.0000 ug | INTRAVENOUS | Status: DC | PRN
  Administered 2022-01-05: 100 ug via INTRAVENOUS
  Administered 2022-01-06: 50 ug via INTRAVENOUS

## 2022-01-05 MED ORDER — DOCUSATE SODIUM 100 MG PO CAPS: 100.0000 mg | ORAL_CAPSULE | Freq: Two times a day (BID) | ORAL | Status: DC | PRN

## 2022-01-05 MED ORDER — PROPOFOL 1000 MG/100ML IV EMUL
INTRAVENOUS | Status: AC
  Filled 2022-01-05: qty 100

## 2022-01-05 MED ORDER — SODIUM CHLORIDE 0.9 % IV SOLN
250.0000 mL | INTRAVENOUS | Status: DC
  Administered 2022-01-05: 250 mL via INTRAVENOUS

## 2022-01-05 MED ORDER — PANTOPRAZOLE 2 MG/ML SUSPENSION
40.0000 mg | Freq: Every day | ORAL | Status: DC
  Administered 2022-01-05 – 2022-01-06 (×2): 40 mg
  Filled 2022-01-05 (×2): qty 20

## 2022-01-05 MED ORDER — PROPOFOL 1000 MG/100ML IV EMUL
INTRAVENOUS | Status: AC
  Administered 2022-01-05: 20 ug via INTRAVENOUS
  Filled 2022-01-05: qty 100

## 2022-01-05 MED ORDER — FENTANYL CITRATE PF 50 MCG/ML IJ SOSY
PREFILLED_SYRINGE | INTRAMUSCULAR | Status: AC
  Filled 2022-01-05: qty 2

## 2022-01-05 MED ORDER — POLYETHYLENE GLYCOL 3350 17 G PO PACK: 17.0000 g | PACK | Freq: Every day | ORAL | Status: DC | PRN

## 2022-01-05 MED ORDER — ENOXAPARIN SODIUM 40 MG/0.4ML IJ SOSY
40.0000 mg | PREFILLED_SYRINGE | INTRAMUSCULAR | Status: DC
  Administered 2022-01-05 – 2022-01-07 (×3): 40 mg via SUBCUTANEOUS
  Filled 2022-01-05 (×3): qty 0.4

## 2022-01-05 MED ORDER — POTASSIUM CHLORIDE 10 MEQ/100ML IV SOLN: 10.0000 meq | INTRAVENOUS | Status: AC

## 2022-01-05 MED ORDER — POTASSIUM CHLORIDE 10 MEQ/100ML IV SOLN
10.0000 meq | INTRAVENOUS | Status: DC
  Administered 2022-01-05: 10 meq via INTRAVENOUS
  Filled 2022-01-05 (×3): qty 100

## 2022-01-05 MED ORDER — ORAL CARE MOUTH RINSE: 15.0000 mL | OROMUCOSAL | Status: DC | PRN

## 2022-01-05 MED ORDER — FENTANYL 2500MCG IN NS 250ML (10MCG/ML) PREMIX INFUSION
50.0000 ug/h | INTRAVENOUS | Status: DC
  Administered 2022-01-05 – 2022-01-06 (×2): 100 ug/h via INTRAVENOUS
  Filled 2022-01-05 (×2): qty 250

## 2022-01-05 MED ORDER — POTASSIUM CHLORIDE 20 MEQ PO PACK
40.0000 meq | PACK | Freq: Once | ORAL | Status: AC
  Administered 2022-01-05: 40 meq
  Filled 2022-01-05: qty 2

## 2022-01-05 MED ORDER — POLYETHYLENE GLYCOL 3350 17 G PO PACK
17.0000 g | PACK | Freq: Every day | ORAL | Status: DC
  Administered 2022-01-06: 17 g
  Filled 2022-01-05: qty 1

## 2022-01-05 MED ORDER — ORAL CARE MOUTH RINSE
15.0000 mL | OROMUCOSAL | Status: DC
  Administered 2022-01-05 – 2022-01-07 (×19): 15 mL via OROMUCOSAL

## 2022-01-05 MED ORDER — SODIUM CHLORIDE 0.9% FLUSH
10.0000 mL | Freq: Two times a day (BID) | INTRAVENOUS | Status: DC
  Administered 2022-01-06 – 2022-01-07 (×3): 10 mL

## 2022-01-05 NOTE — ED Triage Notes (Signed)
Medic called out for c/o breathing problems. When arrived family stated "they not acting  right". Medic observed patient growling and biting at people, combative and easily irritated. Initial BP 60/30. Patient received verset 5 mg and calmed down a little but quickly became irritated and combative again, received haldol 5 mg and became calm. Patient arrived with blood in his mouth, known to be his own. Family unable to give any insight to patient medical history.

## 2022-01-05 NOTE — ED Provider Notes (Signed)
[facility] Provider Note    Event Date/Time   First MD Initiated Contact with Patient 01/05/22 [phone]     (approximate)   History   Respiratory Distress   HPI  Charles Charles is a 61 y.o. male with history of schizophrenia, HIV, latent tuberculosis who presents emergency department EMS for altered mental status, suspected drug overdose.  History provided by patient's family who told EMS that patient and his nephew were in their car tonight using some type of illicit substance.  Family helped to get them out of the car and stated they were acting abnormally and combative.  Family states they all went to sleep and woke up and the patient nephew were unresponsive and they called 911.  Unclear if EMS gave this patient Narcan.  Normal blood sugar.  Decreased responsiveness but no apnea.  Patient was combative with EMS initially and trying to bite them.  They gave him Versed and Haldol through a right tibial IO.   History provided by EMS, police.    Past Medical History:  Diagnosis Date   GERD (gastroesophageal reflux disease)    Headache    Schizo-affective schizophrenia (HCC)     Past Surgical History:  Procedure Laterality Date   COLONOSCOPY WITH PROPOFOL N/A 08/04/2015   Procedure: COLONOSCOPY WITH PROPOFOL;  Surgeon: Midge Minium, MD;  Location: [facility];  Service: Endoscopy;  Laterality: N/A;   INCISION AND DRAINAGE PERIRECTAL ABSCESS N/A 07/14/2015   Procedure: IRRIGATION AND DEBRIDEMENT PERIRECTAL ABSCESS;  Surgeon: Lattie Haw, MD;  Location: [facility];  Service: General;  Laterality: N/A;   INCISION AND DRAINAGE PERIRECTAL ABSCESS N/A 08/14/2015   Procedure: IRRIGATION AND DEBRIDEMENT PERIRECTAL ABSCESS;  Surgeon: Gladis Riffle, MD;  Location: [facility];  Service: General;  Laterality: N/A;    MEDICATIONS:  Prior to Admission medications   Medication Sig Start Date End Date Taking? Authorizing Provider  aspirin 81 MG EC tablet Take  1 tablet (81 mg total) by mouth daily. Patient not taking: No sig reported 06/06/16   [name], Jolanta B, MD  benztropine (COGENTIN) 1 MG tablet Take 1 tablet (1 mg total) by mouth daily. 06/06/16 06/06/17  [name], Ellin Goodie, MD  cyclobenzaprine (FLEXERIL) 5 MG tablet Take 1 tablet (5 mg total) by mouth 3 (three) times daily as needed. Patient not taking: Reported on 01/05/2022 02/06/21   Menshew, Charlesetta Ivory, PA-C  Doravirin-Lamivudin-Tenofov DF (DELSTRIGO PO) Take by mouth. Patient not taking: No sig reported    [provider]  famotidine (PEPCID) 20 MG tablet Take 1 tablet (20 mg total) by mouth 2 (two) times daily. Patient not taking: No sig reported 06/06/16   [name], Jolanta B, MD  haloperidol decanoate (HALDOL DECANOATE) 100 MG/ML injection Inject 1 mL (100 mg total) into the muscle every 30 (thirty) days. Patient not taking: No sig reported 07/02/16   [name], Jolanta B, MD  ibuprofen (ADVIL,MOTRIN) 600 MG tablet Take 1 tablet (600 mg total) by mouth every 6 (six) hours as needed. Patient not taking: No sig reported 06/06/16   [name], Jolanta B, MD  isoniazid (NYDRAZID) 300 MG tablet Take 1 tablet (300 mg total) by mouth daily. Patient not taking: No sig reported 03/29/20   Delfino Lovett, MD  levothyroxine (SYNTHROID, LEVOTHROID) 50 MCG tablet Take 1 tablet (50 mcg total) by mouth daily before breakfast. Patient not taking: No sig reported 06/06/16   [name], Jolanta B, MD  traZODone (DESYREL) 50 MG tablet Take 1 tablet (50  mg total) by mouth at bedtime. Patient not taking: No sig reported 06/06/16   Shari Prows, MD    Physical Exam   Triage Vital Signs: ED Triage Vitals [01/05/22 0526]  Enc Vitals Group     BP 108/62     Pulse Rate 60     Resp 15     Temp      Temp src      SpO2 100 %     Weight      Height      Head Circumference      Peak Flow      Pain Score      Pain Loc      Pain Edu?      Excl. in GC?     Most recent vital  signs: Vitals:   01/05/22 0725 01/05/22 0728  BP: 129/70   Pulse: 91 66  Resp: 20 (!) 21  Temp:  (!) 96.6 F (35.9 C)  SpO2: 97% 100%     CONSTITUTIONAL: Chronically ill-appearing.  Patient has a GCS of 7.  Does not open eyes, follow commands, answer questions.  Will localize to painful stimuli. HEAD: Normocephalic; atraumatic EYES: Conjunctivae clear, PERRL, pupils approximately 4 to 5 mm bilaterally ENT: normal nose; no rhinorrhea; moist mucous membranes; pharynx without lesions noted; no dental injury; abrasion to the tongue with dried blood in the mouth, no septal hematoma, no epistaxis; no facial deformity, poor dentition NECK: Supple, no midline step-off or deformity, trachea midline CARD: RRR; S1 and S2 appreciated; no murmurs, no clicks, no rubs, no gallops RESP: Normal chest excursion without splinting or tachypnea; breath sounds clear and equal bilaterally; no wheezes, no rhonchi, no rales; no hypoxia or respiratory distress, patient is breathing on his own CHEST:  chest wall stable, no crepitus or ecchymosis or deformity,no flail chest ABD/GI: Normal bowel sounds; non-distended; soft PELVIS:  stable, no leg length discrepancy BACK:  The back appears normal; no midline step-off or deformity EXT: No bony deformity, cyanosis, edema SKIN: Normal color for age and race; warm NEURO: GCS 7.  ED Results / Procedures / Treatments   LABS: (all labs ordered are listed, but only abnormal results are displayed) Labs Reviewed  COMPREHENSIVE METABOLIC PANEL - Abnormal; Notable for the following components:      Result Value   Potassium 3.4 (*)    Glucose, Bld 144 (*)    Creatinine, Ser 1.25 (*)    All other components within normal limits  SALICYLATE LEVEL - Abnormal; Notable for the following components:   Salicylate Lvl <7.0 (*)    All other components within normal limits  ACETAMINOPHEN LEVEL - Abnormal; Notable for the following components:   Acetaminophen (Tylenol), Serum  <10 (*)    All other components within normal limits  URINE DRUG SCREEN, QUALITATIVE ([facility] ONLY) - Abnormal; Notable for the following components:   Cocaine Metabolite,Ur [place] POSITIVE (*)    Cannabinoid 50 Ng, Ur [place] POSITIVE (*)    Benzodiazepine, Ur [place] POSITIVE (*)    All other components within normal limits  URINALYSIS, ROUTINE W REFLEX MICROSCOPIC - Abnormal; Notable for the following components:   Color, Urine YELLOW (*)    APPearance CLEAR (*)    Hgb urine dipstick SMALL (*)    All other components within normal limits  BLOOD GAS, ARTERIAL - Abnormal; Notable for the following components:   pO2, Arterial 155 (*)    All other components within normal limits  CBG MONITORING, ED -  Abnormal; Notable for the following components:   Glucose-Capillary 137 (*)    All other components within normal limits  ETHANOL  CBC WITH DIFFERENTIAL/PLATELET  HIV ANTIBODY (ROUTINE TESTING W REFLEX)  BASIC METABOLIC PANEL  TROPONIN I (HIGH SENSITIVITY)     EKG:  EKG Interpretation  Date/Time:  Saturday January 05 2022 07:03:47 EDT Ventricular Rate:  75 PR Interval:  158 QRS Duration: 77 QT Interval:  400 QTC Calculation: 447 R Axis:   59 Text Interpretation: Sinus rhythm Confirmed by Rochele Raring [phone]) on 01/05/2022 7:17:46 AM          RADIOLOGY: My personal review and interpretation of imaging: Chest x-ray clear  I have personally reviewed all radiology reports. DG Chest 1 View  Result Date: 01/05/2022 CLINICAL DATA:  Hypoxia. EXAM: CHEST  1 VIEW COMPARISON:  10/25/2019 FINDINGS: ETT tip is 3.7 cm above the carina. Enteric tube is in place with tip and side port below the GE junction. Heart size and mediastinal contours appear normal. Lung volumes are low. There is mild platelike atelectasis in the lung bases. No pleural effusion or edema. No airspace consolidation. IMPRESSION: Low lung volumes with bibasilar atelectasis.  The Electronically Signed   By: Signa Kell M.D.   On:  01/05/2022 06:51     PROCEDURES:  Critical Care performed: Yes, see critical care procedure note(s)   CRITICAL CARE Performed by: Baxter Hire Jaheim Canino   Total critical care time: 65 minutes  Critical care time was exclusive of separately billable procedures and treating other patients.  Critical care was necessary to treat or prevent imminent or life-threatening deterioration.  Critical care was time spent personally by me on the following activities: development of treatment plan with patient and/or surrogate as well as nursing, discussions with consultants, evaluation of patient's response to treatment, examination of patient, obtaining history from patient or surrogate, ordering and performing treatments and interventions, ordering and review of laboratory studies, ordering and review of radiographic studies, pulse oximetry and re-evaluation of patient's condition.   Marland Kitchen1-3 Lead EKG Interpretation  Performed by: Bennette Hasty, Layla Maw, DO Authorized by: Jaylyn Iyer, Layla Maw, DO     Interpretation: normal     ECG rate:  60   ECG rate assessment: normal     Rhythm: sinus rhythm     Ectopy: none     Conduction: normal   Procedure Name: Intubation Date/Time: 01/05/2022 6:57 AM  Performed by: Laramie Gelles, Layla Maw, DOPre-anesthesia Checklist: Emergency Drugs available, Suction available, Patient identified, Patient being monitored and Timeout performed Oxygen Delivery Method: Nasal cannula Preoxygenation: Pre-oxygenation with 100% oxygen Induction Type: Rapid sequence Ventilation: Mask ventilation without difficulty Laryngoscope Size: Glidescope and 3 Grade View: Grade II Tube size: 7.5 mm Number of attempts: 1 Placement Confirmation: ETT inserted through vocal cords under direct vision, CO2 detector and Breath sounds checked- equal and bilateral Secured at: 24 cm Tube secured with: ETT holder Dental Injury: Teeth and Oropharynx as per pre-operative assessment         IMPRESSION / MDM /  ASSESSMENT AND PLAN / ED COURSE  I reviewed the triage vital signs and the nursing notes.  Patient here after likely illicit drug overdose.  The patient is on the cardiac monitor to evaluate for evidence of arrhythmia and/or significant heart rate changes.   DIFFERENTIAL DIAGNOSIS (includes but not limited to):   Illicit drug overdose, unclear intent for the overdose whether it was intentional or accidental, intoxication, hypercapnia, CVA, intracranial hemorrhage, decompensated schizophrenia  Patient's presentation is most consistent  with acute presentation with potential threat to life or bodily function.  PLAN: We will obtain CBC, CMP, ethanol level, Tylenol, salicylate levels, urine drug screen, urinalysis, EKG.  Blood sugar here is normal.  GCS of 7 and I am worried that patient is not protecting his airway.  He was initially unresponsive with family and then became combative with EMS requiring sedation.  Now again unresponsive except to very painful stimuli he will localize to pain with his upper extremities.  Decision made to intubate patient so that we can protect his airway and obtain further work-up and treatment.   MEDICATIONS GIVEN IN ED: Medications  sodium chloride 0.9 % bolus 1,000 mL (1,000 mLs Intravenous New Bag/Given 01/05/22 3532)    And  0.9 %  sodium chloride infusion ( Intravenous New Bag/Given 01/05/22 0644)  fentaNYL (SUBLIMAZE) 50 MCG/ML injection (  Not Given 01/05/22 0646)  docusate sodium (COLACE) capsule 100 mg (has no administration in time range)  polyethylene glycol (MIRALAX / GLYCOLAX) packet 17 g (has no administration in time range)  enoxaparin (LOVENOX) injection 40 mg (40 mg Subcutaneous Given 01/05/22 0635)  pantoprazole sodium (PROTONIX) 40 mg/20 mL oral suspension 40 mg (has no administration in time range)  ondansetron (ZOFRAN) injection 4 mg (has no administration in time range)  docusate (COLACE) 50 MG/5ML liquid 100 mg (has no administration in time  range)  polyethylene glycol (MIRALAX / GLYCOLAX) packet 17 g (has no administration in time range)  potassium chloride 10 mEq in 100 mL IVPB (has no administration in time range)  naloxone (NARCAN) 2 MG/2ML injection (2 mg  Given 01/05/22 0600)  succinylcholine (ANECTINE) 200 MG/10ML syringe (100 mg  Given 01/05/22 0600)  etomidate (AMIDATE) 2 MG/ML injection (20 mg  Given 01/05/22 0600)  midazolam (VERSED) 2 MG/2ML injection (2 mg  Given 01/05/22 0606)  propofol (DIPRIVAN) 1000 MG/100ML infusion (25 mcg/kg/min  Rate/Dose Change 01/05/22 0720)     ED COURSE: Labs show negative Tylenol and salicylate levels.  Normal hemoglobin and electrolytes.  Minimally elevated creatinine.  He is getting IV fluids.  Ethanol level negative.  Drug screen positive for cocaine, cannabinoids and benzodiazepines.  Chest x-ray reviewed and interpreted by myself and radiologist and shows no acute abnormality.  ABG reassuring.  CT head and cervical spine pending.  Patient to go to the ICU once bed available.  Patient sedated with propofol.   CONSULTS: Discussed with Annabelle Harman with critical care for admission to the ICU.   OUTSIDE RECORDS REVIEWED: Reviewed patient's previous office visit with Sharman Crate on 07/17/2021 at [facility] for HIV.       FINAL CLINICAL IMPRESSION(S) / ED DIAGNOSES   Final diagnoses:  Overdose of undetermined intent, initial encounter     Rx / DC Orders   ED Discharge Orders     None        Note:  This document was prepared using Dragon voice recognition software and may include unintentional dictation errors.   Faustino Luecke, Layla Maw, DO 01/05/22 [phone]

## 2022-01-05 NOTE — Progress Notes (Signed)
Peripherally Inserted Central Catheter Placement  The IV Nurse has discussed with the patient and/or persons authorized to consent for the patient, the purpose of this procedure and the potential benefits and risks involved with this procedure.  The benefits include less needle sticks, lab draws from the catheter, and the patient may be discharged home with the catheter. Risks include, but not limited to, infection, bleeding, blood clot (thrombus formation), and puncture of an artery; nerve damage and irregular heartbeat and possibility to perform a PICC exchange if needed/ordered by physician.  Alternatives to this procedure were also discussed.  Bard Power PICC patient education guide, fact sheet on infection prevention and patient information card has been provided to patient /or left at bedside. Telephone consent obtained from son, Kathlene November.  PICC Placement Documentation  PICC Double Lumen 01/05/22 Right Brachial 37 cm 0 cm (Active)  Indication for Insertion or Continuance of Line Poor Vasculature-patient has had multiple peripheral attempts or PIVs lasting less than 24 hours;Vasoactive infusions 01/05/22 1627  Exposed Catheter (cm) 0 cm 01/05/22 1627  Site Assessment Clean, Dry, Intact 01/05/22 1627  Lumen #1 Status Flushed;Saline locked;Blood return noted 01/05/22 1627  Lumen #2 Status Flushed;Saline locked;Blood return noted 01/05/22 1627  Dressing Type Transparent;Securing device 01/05/22 1627  Dressing Status Antimicrobial disc in place;Clean, Dry, Intact 01/05/22 1627  Safety Lock Not Applicable 01/05/22 1627  Line Care Connections checked and tightened 01/05/22 1627  Line Adjustment (NICU/IV Team Only) No 01/05/22 1627  Dressing Intervention New dressing 01/05/22 1627  Dressing Change Due 01/12/22 01/05/22 1627       Elliot Dally 01/05/2022, 4:28 PM

## 2022-01-05 NOTE — H&P (Signed)
NAME:  Charles Charles, MRN:  [id number], DOB:  01-30-1961, LOS: 0 ADMISSION DATE:  01/05/2022, CONSULTATION DATE: January 05, 2022 REFERRING MD: ED staff, CHIEF COMPLAINT: Unresponsiveness  History of Present Illness:  Patient 61 year old African-American male history of schizophrenia drug abuse was outside his house with his nephew doing drugs when they came back inside the house they were acting weird went to sleep family checked on them they were unresponsive called EMS given Narcan with no effect brought to the hospital GCS was 3 they were intubated for airway protection.  No evidence of seizures or head trauma patient was not able to protect airways.  There was no reported loss of pulse or CPR. Per EMS when they got there the patient was very agitated at some point trying to buy them but then he became very unresponsive he has a tibial IO. Objective   Blood pressure 108/62, pulse 84, resp. rate (!) 22, SpO2 95 %.    FiO2 (%):  [50 %] 50 %  No intake or output data in the 24 hours ending 01/05/22 0709 There were no vitals filed for this visit.  Examination: CONSTITUTIONAL: Intubated sedated HEAD: Normocephalic; atraumatic EYES: Conjunctivae clear, PERRL, pupils approximately 4 to 5 mm bilaterally ENT: normal nose; no rhinorrhea; moist mucous membranes; pharynx without lesions noted; no dental injury; abrasion to the tongue with dried blood in the mouth, no septal hematoma, no epistaxis; no facial deformity, poor dentition NECK: Supple, no midline step-off or deformity, trachea midline CARD: RRR; S1 and S2 appreciated; no murmurs, no clicks, no rubs, no gallops RESP: Normal chest excursion without splinting or tachypnea; breath sounds clear and equal bilaterally; no wheezes, no rhonchi, no rales; no hypoxia or respiratory distress, patient is breathing on his own CHEST:  chest wall stable, no crepitus or ecchymosis or deformity,no flail chest ABD/GI: Normal bowel sounds; non-distended;  soft PELVIS:  stable, no leg length discrepancy BACK:  The back appears normal; no midline step-off or deformity EXT: No bony deformity, cyanosis, edema SKIN: Normal color for age and race; warm NEURO: Intubated sedated  Assessment & Plan:     Acute hypoxic respiratory failure secondary to aspiration in the setting of drug overdose Mechanical intubation - Full vent support for now: vent settings reviewed and established - SBT once all parameters met - VAP bundle implemented  - Wean PEEP and FiO2 as able to maintain O2 sats >92% - Goal plateau pressure less than 30, driving pressure less than 15 - Prn bronchodilator therapy      Acute toxic metabolic encephalopathy suspected secondary to drug overdose Mechanical intubation discomfort/pain  Hx: Cocaine abuse - Maintain RASS goal 0 to -1 - PAD protocol: propofol gtt to maintain RASS goal  - WUA daily  - Will need polysubstance abuse cessation counseling once extubated    Labs   CBC: Recent Labs  Lab 01/05/22 0542  WBC 9.8  NEUTROABS 7.2  HGB 15.2  HCT 44.5  MCV 93.3  PLT 491    Basic Metabolic Panel: Recent Labs  Lab 01/05/22 0542  NA 138  K 3.4*  CL 102  CO2 26  GLUCOSE 144*  BUN 13  CREATININE 1.25*  CALCIUM 9.2   GFR: CrCl cannot be calculated (Unknown ideal weight.). Recent Labs  Lab 01/05/22 0542  WBC 9.8    Liver Function Tests: Recent Labs  Lab 01/05/22 0542  AST 34  ALT 30  ALKPHOS 55  BILITOT 0.7  PROT 7.6  ALBUMIN 3.7  No results for input(s): "LIPASE", "AMYLASE" in the last 168 hours. No results for input(s): "AMMONIA" in the last 168 hours.  ABG    Component Value Date/Time   PHART 7.39 01/05/2022 0534   PCO2ART 43 01/05/2022 0534   PO2ART 155 (H) 01/05/2022 0534   HCO3 26.0 01/05/2022 0534   O2SAT 99.9 01/05/2022 0534     Coagulation Profile: No results for input(s): "INR", "PROTIME" in the last 168 hours.  Cardiac Enzymes: No results for input(s): "CKTOTAL",  "CKMB", "CKMBINDEX", "TROPONINI" in the last 168 hours.  HbA1C: Hemoglobin A1C  Date/Time Value Ref Range Status  01/27/2014 05:30 AM 5.7 4.2 - 6.3 % Final    Comment:    The American Diabetes Association recommends that a primary goal of therapy should be <7% and that physicians should reevaluate the treatment regimen in patients with HbA1c values consistently >8%.    Hgb A1c MFr Bld  Date/Time Value Ref Range Status  06/05/2016 06:37 AM 5.7 (H) 4.8 - 5.6 % Final    Comment:    (NOTE)         Pre-diabetes: 5.7 - 6.4         Diabetes: >6.4         Glycemic control for adults with diabetes: <7.0   06/04/2016 06:55 AM 5.6 4.8 - 5.6 % Final    Comment:    (NOTE)         Pre-diabetes: 5.7 - 6.4         Diabetes: >6.4         Glycemic control for adults with diabetes: <7.0     CBG: Recent Labs  Lab 01/05/22 0533  GLUCAP 137*    Review of Systems:   Unable to obtain due to patient condition  Past Medical History:  He,  has a past medical history of GERD (gastroesophageal reflux disease), Headache, and Schizo-affective schizophrenia ([place]).   Surgical History:   Past Surgical History:  Procedure Laterality Date   COLONOSCOPY WITH PROPOFOL N/A 08/04/2015   Procedure: COLONOSCOPY WITH PROPOFOL;  Surgeon: Lucilla Lame, MD;  Location: [facility];  Service: Endoscopy;  Laterality: N/A;   INCISION AND DRAINAGE PERIRECTAL ABSCESS N/A 07/14/2015   Procedure: IRRIGATION AND DEBRIDEMENT PERIRECTAL ABSCESS;  Surgeon: Florene Glen, MD;  Location: [facility];  Service: General;  Laterality: N/A;   INCISION AND DRAINAGE PERIRECTAL ABSCESS N/A 08/14/2015   Procedure: IRRIGATION AND DEBRIDEMENT PERIRECTAL ABSCESS;  Surgeon: Hubbard Robinson, MD;  Location: [facility];  Service: General;  Laterality: N/A;     Social History:   reports that he has been smoking cigarettes. He has been smoking an average of 2 packs per day. He has never used smokeless tobacco. He reports current  alcohol use of about 56.0 standard drinks of alcohol per week. He reports current drug use. Drugs: Cocaine and Marijuana.   Family History:  His family history includes Anxiety disorder in his mother; Diabetes in his father and mother.   Allergies Allergies  Allergen Reactions   Penicillins Anaphylaxis, Hives and Other (See Comments)    Has patient had a PCN reaction causing immediate rash, facial/tongue/throat swelling, SOB or lightheadedness with hypotension: Yes Has patient had a PCN reaction causing severe rash involving mucus membranes or skin necrosis: No Has patient had a PCN reaction that required hospitalization No Has patient had a PCN reaction occurring within the last 10 years: Yes If all of the above answers are "NO", then may proceed with Cephalosporin use. Other  reaction(s): UNKNOWN Has patient had a PCN reaction causing immediate rash, facial/tongue/throat swelling, SOB or lightheadedness with hypotension: Yes Has patient had a PCN reaction causing severe rash involving mucus membranes or skin necrosis: No Has patient had a PCN reaction that required hospitalization No Has patient had a PCN reaction occurring within the last 10 years: Yes If all of the above answers are "NO", then may proceed with Cephalosporin use.   Sulfa Antibiotics Itching     Home Medications  Prior to Admission medications   Medication Sig Start Date End Date Taking? Authorizing Provider  aspirin 81 MG EC tablet Take 1 tablet (81 mg total) by mouth daily. Patient not taking: No sig reported 06/06/16   [name], Jolanta B, MD  benztropine (COGENTIN) 1 MG tablet Take 1 tablet (1 mg total) by mouth daily. 06/06/16 06/06/17  [name], Wardell Honour, MD  cyclobenzaprine (FLEXERIL) 5 MG tablet Take 1 tablet (5 mg total) by mouth 3 (three) times daily as needed. Patient not taking: Reported on 01/05/2022 02/06/21   Menshew, Dannielle Karvonen, PA-C  Doravirin-Lamivudin-Tenofov DF (DELSTRIGO PO) Take by  mouth. Patient not taking: No sig reported    [provider]  famotidine (PEPCID) 20 MG tablet Take 1 tablet (20 mg total) by mouth 2 (two) times daily. Patient not taking: No sig reported 06/06/16   [name], Jolanta B, MD  haloperidol decanoate (HALDOL DECANOATE) 100 MG/ML injection Inject 1 mL (100 mg total) into the muscle every 30 (thirty) days. Patient not taking: No sig reported 07/02/16   [name], Jolanta B, MD  ibuprofen (ADVIL,MOTRIN) 600 MG tablet Take 1 tablet (600 mg total) by mouth every 6 (six) hours as needed. Patient not taking: No sig reported 06/06/16   [name], Jolanta B, MD  isoniazid (NYDRAZID) 300 MG tablet Take 1 tablet (300 mg total) by mouth daily. Patient not taking: No sig reported 03/29/20   Max Sane, MD  levothyroxine (SYNTHROID, LEVOTHROID) 50 MCG tablet Take 1 tablet (50 mcg total) by mouth daily before breakfast. Patient not taking: No sig reported 06/06/16   [name], Jolanta B, MD  traZODone (DESYREL) 50 MG tablet Take 1 tablet (50 mg total) by mouth at bedtime. Patient not taking: No sig reported 06/06/16   Clovis Fredrickson, MD

## 2022-01-05 NOTE — Progress Notes (Signed)
Chaplain visited with patient at bedside. Pt was resting and there appeared to be no family present or in ICU waiting area. Ch followed up with pt. per Chaplain Amy's request. Chaplain offered compassionate presence and prayer. Please contact Chaplain if needed.

## 2022-01-05 NOTE — Progress Notes (Signed)
Mode changed to [facility] volume 450 per NP E. Anna Genre

## 2022-01-05 NOTE — Plan of Care (Signed)
Continuing with plan of care. 

## 2022-01-05 NOTE — Progress Notes (Signed)
PHARMACY CONSULT NOTE - FOLLOW UP  Pharmacy Consult for Electrolyte Monitoring and Replacement   Recent Labs: Potassium (mmol/L)  Date Value  01/05/2022 3.4 (L)  01/24/2014 3.6   Calcium (mg/dL)  Date Value  10/93/2355 9.2   Calcium, Total (mg/dL)  Date Value  73/22/0254 8.7   Albumin (g/dL)  Date Value  27/11/2374 3.7  04/19/2020 4.3  01/24/2014 2.9 (L)   Sodium (mmol/L)  Date Value  01/05/2022 138  01/24/2014 141     Assessment: 7/8:  K @ 0542 = 3.4  Goal of Therapy:  Electrolytes WNL   Plan:  KCl 10 mEq IV X 3 to start on 7/8 @ ~ 0700. Will repeat BMP @ 1200.   Scherrie Gerlach ,PharmD Clinical Pharmacist 01/05/2022 6:42 AM

## 2022-01-05 NOTE — Progress Notes (Addendum)
Dr. Catha Gosselin notified of temperature ranging from 100.6 to 100.9, awaiting response. Dr. Catha Gosselin responded and instructed to let patient ride out his low grade temperature at this time.

## 2022-01-05 NOTE — Plan of Care (Signed)
Patient remains in ICU. Patient remains intubated, sedated. CVC and Foley remain in place. OGT to LIWS. No restraints at present.   Problem: Education: Goal: Knowledge of General Education information will improve Description: Including pain rating scale, medication(s)/side effects and non-pharmacologic comfort measures 01/05/2022 2034 by Rosana Fret, RN Outcome: Progressing 01/05/2022 2033 by Rosana Fret, RN Outcome: Progressing   Problem: Health Behavior/Discharge Planning: Goal: Ability to manage health-related needs will improve 01/05/2022 2034 by Rosana Fret, RN Outcome: Progressing 01/05/2022 2033 by Rosana Fret, RN Outcome: Progressing   Problem: Clinical Measurements: Goal: Ability to maintain clinical measurements within normal limits will improve 01/05/2022 2034 by Rosana Fret, RN Outcome: Progressing 01/05/2022 2033 by Rosana Fret, RN Outcome: Progressing Goal: Will remain free from infection 01/05/2022 2034 by Rosana Fret, RN Outcome: Progressing 01/05/2022 2033 by Rosana Fret, RN Outcome: Progressing Goal: Diagnostic test results will improve 01/05/2022 2034 by Rosana Fret, RN Outcome: Progressing 01/05/2022 2033 by Rosana Fret, RN Outcome: Progressing Goal: Respiratory complications will improve 01/05/2022 2034 by Rosana Fret, RN Outcome: Progressing 01/05/2022 2033 by Rosana Fret, RN Outcome: Progressing Goal: Cardiovascular complication will be avoided 01/05/2022 2034 by Rosana Fret, RN Outcome: Progressing 01/05/2022 2033 by Rosana Fret, RN Outcome: Progressing   Problem: Activity: Goal: Risk for activity intolerance will decrease 01/05/2022 2034 by Rosana Fret, RN Outcome: Progressing 01/05/2022 2033 by Rosana Fret, RN Outcome: Progressing   Problem: Nutrition: Goal: Adequate nutrition will be maintained 01/05/2022 2034 by Rosana Fret, RN Outcome: Progressing 01/05/2022 2033 by Rosana Fret, RN Outcome:  Progressing   Problem: Coping: Goal: Level of anxiety will decrease 01/05/2022 2034 by Rosana Fret, RN Outcome: Progressing 01/05/2022 2033 by Rosana Fret, RN Outcome: Progressing   Problem: Elimination: Goal: Will not experience complications related to bowel motility 01/05/2022 2034 by Rosana Fret, RN Outcome: Progressing 01/05/2022 2033 by Rosana Fret, RN Outcome: Progressing Goal: Will not experience complications related to urinary retention 01/05/2022 2034 by Rosana Fret, RN Outcome: Progressing 01/05/2022 2033 by Rosana Fret, RN Outcome: Progressing   Problem: Pain Managment: Goal: General experience of comfort will improve 01/05/2022 2034 by Rosana Fret, RN Outcome: Progressing 01/05/2022 2033 by Rosana Fret, RN Outcome: Progressing   Problem: Safety: Goal: Ability to remain free from injury will improve 01/05/2022 2034 by Rosana Fret, RN Outcome: Progressing 01/05/2022 2033 by Rosana Fret, RN Outcome: Progressing   Problem: Skin Integrity: Goal: Risk for impaired skin integrity will decrease 01/05/2022 2034 by Rosana Fret, RN Outcome: Progressing 01/05/2022 2033 by Rosana Fret, RN Outcome: Progressing

## 2022-01-05 NOTE — Progress Notes (Signed)
   01/05/22 0630  Clinical Encounter Type  Visited With Patient not available  Visit Type Code  Spiritual Encounters  Spiritual Needs  (unable to assess)   Chaplain Jamilah Jean checked for presence of family; checked on pt's status. Chaplain offered support to staffand attempted to assess situation due to concurrent admission of pt's nephew.  Chaplain B will refer to chaplain on-call for 7/8 to be aware of situation and possible needs of pt and family.

## 2022-01-05 NOTE — Progress Notes (Signed)
PHARMACY CONSULT NOTE - FOLLOW UP  Pharmacy Consult for Electrolyte Monitoring and Replacement   Recent Labs: Potassium (mmol/L)  Date Value  01/05/2022 3.6  01/24/2014 3.6   Calcium (mg/dL)  Date Value  56/21/3086 9.1   Calcium, Total (mg/dL)  Date Value  57/84/6962 8.7   Albumin (g/dL)  Date Value  95/28/4132 3.7  04/19/2020 4.3  01/24/2014 2.9 (L)   Sodium (mmol/L)  Date Value  01/05/2022 143  01/24/2014 141     Assessment: Pharmacy has been consulted to monitor and replace electrolytes in 61yo patient that was found unresponsive at his home by family members. Patient has a history of cocaine use and is believe to have had a drug overdose. Patient was given narcan with no effect, and is currently intubated.  IVF: NS@125ml /h  Goal of Therapy:  Electrolytes WNL   Plan:  - KCl 10 mEq IV X 3 doses ordered - Will recheck electrolytes with AM labs  Bettey Costa ,PharmD Clinical Pharmacist 01/05/2022 1:15 PM

## 2022-01-05 NOTE — Progress Notes (Signed)
*  PRELIMINARY RESULTS* Echocardiogram 2D Echocardiogram has been performed.  Charles Charles 01/05/2022, 3:38 PM

## 2022-01-05 NOTE — ED Notes (Signed)
Ariel RN aware of assigned bed 

## 2022-01-05 NOTE — Progress Notes (Signed)
Earlier in the shift received ordered for propofol drip and Levophed gtt.

## 2022-01-05 NOTE — Progress Notes (Signed)
IV team nurse is at bedside at this time placing PICC line.

## 2022-01-06 DIAGNOSIS — E039 Hypothyroidism, unspecified: Secondary | ICD-10-CM

## 2022-01-06 DIAGNOSIS — T50904A Poisoning by unspecified drugs, medicaments and biological substances, undetermined, initial encounter: Secondary | ICD-10-CM

## 2022-01-06 DIAGNOSIS — F1010 Alcohol abuse, uncomplicated: Secondary | ICD-10-CM

## 2022-01-06 DIAGNOSIS — F1020 Alcohol dependence, uncomplicated: Secondary | ICD-10-CM | POA: Diagnosis not present

## 2022-01-06 LAB — HIV ANTIBODY (ROUTINE TESTING W REFLEX): HIV Screen 4th Generation wRfx: REACTIVE — AB

## 2022-01-06 LAB — CBC
HCT: 38.1 % — ABNORMAL LOW (ref 39.0–52.0)
Hemoglobin: 13.1 g/dL (ref 13.0–17.0)
MCH: 32.2 pg (ref 26.0–34.0)
MCHC: 34.4 g/dL (ref 30.0–36.0)
MCV: 93.6 fL (ref 80.0–100.0)
Platelets: 258 10*3/uL (ref 150–400)
RBC: 4.07 MIL/uL — ABNORMAL LOW (ref 4.22–5.81)
RDW: 14 % (ref 11.5–15.5)
WBC: 6.5 10*3/uL (ref 4.0–10.5)
nRBC: 0 % (ref 0.0–0.2)

## 2022-01-06 LAB — GLUCOSE, CAPILLARY
Glucose-Capillary: 104 mg/dL — ABNORMAL HIGH (ref 70–99)
Glucose-Capillary: 106 mg/dL — ABNORMAL HIGH (ref 70–99)
Glucose-Capillary: 119 mg/dL — ABNORMAL HIGH (ref 70–99)
Glucose-Capillary: 122 mg/dL — ABNORMAL HIGH (ref 70–99)
Glucose-Capillary: 87 mg/dL (ref 70–99)
Glucose-Capillary: 94 mg/dL (ref 70–99)
Glucose-Capillary: 97 mg/dL (ref 70–99)

## 2022-01-06 LAB — TRIGLYCERIDES: Triglycerides: 206 mg/dL — ABNORMAL HIGH (ref <150)

## 2022-01-06 LAB — BASIC METABOLIC PANEL
Anion gap: 5 (ref 5–15)
BUN: 10 mg/dL (ref 8–23)
CO2: 24 mmol/L (ref 22–32)
Calcium: 8.5 mg/dL — ABNORMAL LOW (ref 8.9–10.3)
Chloride: 118 mmol/L — ABNORMAL HIGH (ref 98–111)
Creatinine, Ser: 0.88 mg/dL (ref 0.61–1.24)
GFR, Estimated: >60 mL/min (ref >60)
Glucose, Bld: 108 mg/dL — ABNORMAL HIGH (ref 70–99)
Potassium: 3.2 mmol/L — ABNORMAL LOW (ref 3.5–5.1)
Sodium: 147 mmol/L — ABNORMAL HIGH (ref 135–145)

## 2022-01-06 LAB — BLOOD GAS, ARTERIAL
Acid-base deficit: 1.3 mmol/L (ref 0.0–2.0)
Bicarbonate: 22.7 mmol/L (ref 20.0–28.0)
FIO2: 30 %
MECHVT: 450 mL
Mechanical Rate: 15
O2 Saturation: 100 %
PEEP: 5 cmH2O
Patient temperature: 37
pCO2 arterial: 35 mmHg (ref 32–48)
pH, Arterial: 7.42 (ref 7.35–7.45)
pO2, Arterial: 133 mmHg — ABNORMAL HIGH (ref 83–108)

## 2022-01-06 LAB — PROCALCITONIN: Procalcitonin: <0.1 ng/mL

## 2022-01-06 LAB — MAGNESIUM: Magnesium: 2.3 mg/dL (ref 1.7–2.4)

## 2022-01-06 LAB — PHOSPHORUS: Phosphorus: 2.7 mg/dL (ref 2.5–4.6)

## 2022-01-06 MED ORDER — FREE WATER
100.0000 mL | Status: DC
  Administered 2022-01-06: 100 mL

## 2022-01-06 MED ORDER — POTASSIUM CHLORIDE 20 MEQ PO PACK
40.0000 meq | PACK | Freq: Once | ORAL | Status: AC
  Administered 2022-01-06: 40 meq
  Filled 2022-01-06: qty 2

## 2022-01-06 NOTE — Progress Notes (Addendum)
NAME:  Charles Charles, MRN:  [id number], DOB:  09-26-1960, LOS: 1 ADMISSION DATE:  01/05/2022, CONSULTATION DATE: January 05, 2022 REFERRING MD: ED staff, CHIEF COMPLAINT: Unresponsiveness  History of Present Illness:  Patient 61 year old African-American male history of schizophrenia drug abuse was outside his house with his nephew doing drugs when they came back inside the house they were acting weird went to sleep family checked on them they were unresponsive called EMS given Narcan with no effect brought to the hospital GCS was 3 they were intubated for airway protection.  No evidence of seizures or head trauma patient was not able to protect airways.  There was no reported loss of pulse or CPR. Per EMS when they got there the patient was very agitated at some point trying to buy them but then he became very unresponsive he has a tibial IO.  Significant Events:  07/8: Pt admitted with acute toxic metabolic encephalopathy and acute hypoxic respiratory failure secondary to suspected drug overdose requiring mechanical intubation  07/8: CT Head/Cervical Spine revealed No acute intracranial abnormality. Asymmetric focal area of low attenuation within the left posterior frontal cortex and insular cortex is concerning for chronic infarct. No evidence for cervical spine fracture or subluxation. Cervical spondylosis. 07/9: Attempted SBT pt developed increased work of breathing with respiratory rate upper 40's.  He did not follow commands but did reach for ETT.  Requiring low dose versed, fentanyl, and propofol gtts due to severe agitation/delirium    Objective   Blood pressure 105/61, pulse (!) 52, temperature (!) 97.5 F (36.4 C), resp. rate 16, height $RemoveBe'5\' 3"'[id number]$  (1.6 m), weight 70.8 kg, SpO2 100 %.    Vent Mode: PRVC FiO2 (%):  [30 %-40 %] 30 % Set Rate:  [15 bmp] 15 bmp Vt Set:  [450 mL] 450 mL PEEP:  [5 cmH20] 5 cmH20 Plateau Pressure:  [15 cmH20-17 cmH20] 17 cmH20   Intake/Output Summary (Last 24  hours) at 01/06/2022 1008 Last data filed at 01/06/2022 0800 Gross per 24 hour  Intake 4001.32 ml  Output 3380 ml  Net 621.32 ml   Filed Weights   01/05/22 0943 01/06/22 0315  Weight: 68.6 kg 70.8 kg    Examination: CONSTITUTIONAL: Acutely ill male, sedated NAD mechanically intubated  HEENT: Supple, no JVD, poor dentition  CARD: Sinus bradycardia, s1s2, no r/g, 2+ radial/2+ distal pulses, no edema  RESP: Rhonchi LLL, clear throughout all other lobes, even, non labored  GI: +BS x4, soft, non distended EXT: Normal bulk and tone SKIN: Intact no rashes or lesions present  NEURO: Sedated, purposeful movement reaching for ETT, not following commands, PERRL  Assessment & Plan:  Acute hypoxic respiratory failure secondary to aspiration in the setting of drug overdose Mechanical intubation - Full vent support for now: vent settings reviewed and established - SBT once all parameters met - VAP bundle implemented  - Wean PEEP and FiO2 as able to maintain O2 sats >92% - Goal plateau pressure less than 30, driving pressure less than 15 - Prn bronchodilator therapy    Hypernatremia  Hypokalemia  - Trend BMP  - Replace electrolytes as indicated - Monitor UOP - Will start free water per tube   Acute toxic metabolic encephalopathy suspected secondary to drug overdose Mechanical intubation discomfort/pain  Hx: Cocaine abuse - Maintain RASS goal -1 - PAD protocol: propofol, versed, and fentanyl gtts to maintain RASS goal  - WUA daily  - Will need polysubstance abuse cessation counseling once extubated   Best Practice (right  click and "Reselect all SmartList Selections" daily)   Diet/type: NPO; if remains mechanically intubated will consult dietitian for initiation of TF's  DVT prophylaxis: LMWH GI prophylaxis: PPI Lines: RUE PICC line  Foley:  Yes, and it is still needed Code Status:  full code Last date of multidisciplinary goals of care discussion [N/A] Labs   CBC: Recent Labs   Lab 01/05/22 0542 01/06/22 0318  WBC 9.8 6.5  NEUTROABS 7.2  --   HGB 15.2 13.1  HCT 44.5 38.1*  MCV 93.3 93.6  PLT 292 473    Basic Metabolic Panel: Recent Labs  Lab 01/05/22 0542 01/05/22 1212 01/06/22 0318  NA 138 143 147*  K 3.4* 3.6 3.2*  CL 102 107 118*  CO2 $Re'26 23 24  'hiL$ GLUCOSE 144* 100* 108*  BUN $Re'13 12 10  'tCK$ CREATININE 1.25* 0.98 0.88  CALCIUM 9.2 9.1 8.5*  MG  --   --  2.3  PHOS  --   --  2.7   GFR: Estimated Creatinine Clearance: 76.9 mL/min (by C-G formula based on SCr of 0.88 mg/dL). Recent Labs  Lab 01/05/22 0542 01/06/22 0318  WBC 9.8 6.5    Liver Function Tests: Recent Labs  Lab 01/05/22 0542  AST 34  ALT 30  ALKPHOS 55  BILITOT 0.7  PROT 7.6  ALBUMIN 3.7   No results for input(s): "LIPASE", "AMYLASE" in the last 168 hours. No results for input(s): "AMMONIA" in the last 168 hours.  ABG    Component Value Date/Time   PHART 7.42 01/06/2022 0500   PCO2ART 35 01/06/2022 0500   PO2ART 133 (H) 01/06/2022 0500   HCO3 22.7 01/06/2022 0500   ACIDBASEDEF 1.3 01/06/2022 0500   O2SAT 100 01/06/2022 0500     Coagulation Profile: No results for input(s): "INR", "PROTIME" in the last 168 hours.  Cardiac Enzymes: No results for input(s): "CKTOTAL", "CKMB", "CKMBINDEX", "TROPONINI" in the last 168 hours.  HbA1C: Hemoglobin A1C  Date/Time Value Ref Range Status  01/27/2014 05:30 AM 5.7 4.2 - 6.3 % Final    Comment:    The American Diabetes Association recommends that a primary goal of therapy should be <7% and that physicians should reevaluate the treatment regimen in patients with HbA1c values consistently >8%.    Hgb A1c MFr Bld  Date/Time Value Ref Range Status  06/05/2016 06:37 AM 5.7 (H) 4.8 - 5.6 % Final    Comment:    (NOTE)         Pre-diabetes: 5.7 - 6.4         Diabetes: >6.4         Glycemic control for adults with diabetes: <7.0   06/04/2016 06:55 AM 5.6 4.8 - 5.6 % Final    Comment:    (NOTE)         Pre-diabetes: 5.7 -  6.4         Diabetes: >6.4         Glycemic control for adults with diabetes: <7.0     CBG: Recent Labs  Lab 01/05/22 1939 01/05/22 1950 01/06/22 0039 01/06/22 0331 01/06/22 0755  GLUCAP 110* 105* 119* 87 122*    Review of Systems:   Unable to assess pt mechanically intubated   Past Medical History:  He,  has a past medical history of GERD (gastroesophageal reflux disease), Headache, and Schizo-affective schizophrenia ([facility]).   Surgical History:   Past Surgical History:  Procedure Laterality Date   COLONOSCOPY WITH PROPOFOL N/A 08/04/2015   Procedure: COLONOSCOPY WITH PROPOFOL;  Surgeon: Lucilla Lame, MD;  Location: [place];  Service: Endoscopy;  Laterality: N/A;   INCISION AND DRAINAGE PERIRECTAL ABSCESS N/A 07/14/2015   Procedure: IRRIGATION AND DEBRIDEMENT PERIRECTAL ABSCESS;  Surgeon: Florene Glen, MD;  Location: [facility];  Service: General;  Laterality: N/A;   INCISION AND DRAINAGE PERIRECTAL ABSCESS N/A 08/14/2015   Procedure: IRRIGATION AND DEBRIDEMENT PERIRECTAL ABSCESS;  Surgeon: Hubbard Robinson, MD;  Location: [facility];  Service: General;  Laterality: N/A;     Social History:   reports that he has been smoking cigarettes. He has been smoking an average of 2 packs per day. He has never used smokeless tobacco. He reports current alcohol use of about 56.0 standard drinks of alcohol per week. He reports current drug use. Drugs: Cocaine and Marijuana.   Family History:  His family history includes Anxiety disorder in his mother; Diabetes in his father and mother.   Allergies Allergies  Allergen Reactions   Penicillins Anaphylaxis, Hives and Other (See Comments)    Has patient had a PCN reaction causing immediate rash, facial/tongue/throat swelling, SOB or lightheadedness with hypotension: Yes Has patient had a PCN reaction causing severe rash involving mucus membranes or skin necrosis: No Has patient had a PCN reaction that required hospitalization  No Has patient had a PCN reaction occurring within the last 10 years: Yes If all of the above answers are "NO", then may proceed with Cephalosporin use. Other reaction(s): UNKNOWN Has patient had a PCN reaction causing immediate rash, facial/tongue/throat swelling, SOB or lightheadedness with hypotension: Yes Has patient had a PCN reaction causing severe rash involving mucus membranes or skin necrosis: No Has patient had a PCN reaction that required hospitalization No Has patient had a PCN reaction occurring within the last 10 years: Yes If all of the above answers are "NO", then may proceed with Cephalosporin use.   Sulfa Antibiotics Itching     Home Medications  Prior to Admission medications   Medication Sig Start Date End Date Taking? Authorizing Provider  aspirin 81 MG EC tablet Take 1 tablet (81 mg total) by mouth daily. Patient not taking: No sig reported 06/06/16   [name], Jolanta B, MD  benztropine (COGENTIN) 1 MG tablet Take 1 tablet (1 mg total) by mouth daily. 06/06/16 06/06/17  [name], Wardell Honour, MD  cyclobenzaprine (FLEXERIL) 5 MG tablet Take 1 tablet (5 mg total) by mouth 3 (three) times daily as needed. Patient not taking: Reported on 01/05/2022 02/06/21   Menshew, Dannielle Karvonen, PA-C  Doravirin-Lamivudin-Tenofov DF (DELSTRIGO PO) Take by mouth. Patient not taking: No sig reported    [provider]  famotidine (PEPCID) 20 MG tablet Take 1 tablet (20 mg total) by mouth 2 (two) times daily. Patient not taking: No sig reported 06/06/16   [name], Jolanta B, MD  haloperidol decanoate (HALDOL DECANOATE) 100 MG/ML injection Inject 1 mL (100 mg total) into the muscle every 30 (thirty) days. Patient not taking: No sig reported 07/02/16   [name], Jolanta B, MD  ibuprofen (ADVIL,MOTRIN) 600 MG tablet Take 1 tablet (600 mg total) by mouth every 6 (six) hours as needed. Patient not taking: No sig reported 06/06/16   [name], Jolanta B, MD  isoniazid (NYDRAZID) 300 MG  tablet Take 1 tablet (300 mg total) by mouth daily. Patient not taking: No sig reported 03/29/20   Max Sane, MD  levothyroxine (SYNTHROID, LEVOTHROID) 50 MCG tablet Take 1 tablet (50 mcg total) by mouth daily before breakfast. Patient not taking: No sig reported  06/06/16   [name], Herma Ard B, MD  traZODone (DESYREL) 50 MG tablet Take 1 tablet (50 mg total) by mouth at bedtime. Patient not taking: No sig reported 06/06/16   Clovis Fredrickson, MD     Critical Care Time: 30 minutes   Donell Beers, [place] Pager [phone] (please enter 7 digits) PCCM Consult Pager [phone] (please enter 7 digits)

## 2022-01-06 NOTE — Progress Notes (Signed)
Pt self extubated at this time. Pt placed on 2Lnc

## 2022-01-06 NOTE — Progress Notes (Signed)
PHARMACY CONSULT NOTE - FOLLOW UP  Pharmacy Consult for Electrolyte Monitoring and Replacement   Recent Labs: Potassium (mmol/L)  Date Value  01/06/2022 3.2 (L)  01/24/2014 3.6   Magnesium (mg/dL)  Date Value  32/20/2542 2.3   Calcium (mg/dL)  Date Value  70/62/3762 8.5 (L)   Calcium, Total (mg/dL)  Date Value  83/15/1761 8.7   Albumin (g/dL)  Date Value  60/73/7106 3.7  04/19/2020 4.3  01/24/2014 2.9 (L)   Phosphorus (mg/dL)  Date Value  26/94/8546 2.7   Sodium (mmol/L)  Date Value  01/06/2022 147 (H)  01/24/2014 141     Assessment: Pharmacy has been consulted to monitor and replace electrolytes in 61yo patient that was found unresponsive at his home by family members. Patient has a history of cocaine use and is believe to have had a drug overdose. Patient was given narcan with no effect, and is currently intubated.  IVF: NS@125ml /h  Goal of Therapy:  Electrolytes WNL   Plan:  - KCl PO x 1 ordered by CCNP - Will recheck electrolytes with AM labs  Bettey Costa ,PharmD Clinical Pharmacist 01/06/2022 9:35 AM

## 2022-01-06 NOTE — Progress Notes (Signed)
Pt self extubated, VSS, pt voice weak, Rn notified NP Nelson and RT, RT at bedside placed pt on 2L via [place].

## 2022-01-07 ENCOUNTER — Inpatient Hospital Stay: Payer: Medicaid Other

## 2022-01-07 DIAGNOSIS — T50901A Poisoning by unspecified drugs, medicaments and biological substances, accidental (unintentional), initial encounter: Secondary | ICD-10-CM | POA: Diagnosis not present

## 2022-01-07 DIAGNOSIS — B20 Human immunodeficiency virus [HIV] disease: Secondary | ICD-10-CM

## 2022-01-07 LAB — CBC WITH DIFFERENTIAL/PLATELET
Abs Immature Granulocytes: 0.03 10*3/uL (ref 0.00–0.07)
Basophils Absolute: 0 10*3/uL (ref 0.0–0.1)
Basophils Relative: 0 %
Eosinophils Absolute: 0 10*3/uL (ref 0.0–0.5)
Eosinophils Relative: 1 %
HCT: 39.4 % (ref 39.0–52.0)
Hemoglobin: 13.2 g/dL (ref 13.0–17.0)
Immature Granulocytes: 0 %
Lymphocytes Relative: 27 %
Lymphs Abs: 1.9 10*3/uL (ref 0.7–4.0)
MCH: 31.9 pg (ref 26.0–34.0)
MCHC: 33.5 g/dL (ref 30.0–36.0)
MCV: 95.2 fL (ref 80.0–100.0)
Monocytes Absolute: 0.7 10*3/uL (ref 0.1–1.0)
Monocytes Relative: 10 %
Neutro Abs: 4.4 10*3/uL (ref 1.7–7.7)
Neutrophils Relative %: 62 %
Platelets: 239 10*3/uL (ref 150–400)
RBC: 4.14 MIL/uL — ABNORMAL LOW (ref 4.22–5.81)
RDW: 14 % (ref 11.5–15.5)
WBC: 7.1 10*3/uL (ref 4.0–10.5)
nRBC: 0 % (ref 0.0–0.2)

## 2022-01-07 LAB — COMPREHENSIVE METABOLIC PANEL
ALT: 31 U/L (ref 0–44)
AST: 45 U/L — ABNORMAL HIGH (ref 15–41)
Albumin: 2.7 g/dL — ABNORMAL LOW (ref 3.5–5.0)
Alkaline Phosphatase: 42 U/L (ref 38–126)
Anion gap: 7 (ref 5–15)
BUN: 8 mg/dL (ref 8–23)
CO2: 25 mmol/L (ref 22–32)
Calcium: 8.7 mg/dL — ABNORMAL LOW (ref 8.9–10.3)
Chloride: 117 mmol/L — ABNORMAL HIGH (ref 98–111)
Creatinine, Ser: 0.87 mg/dL (ref 0.61–1.24)
GFR, Estimated: >60 mL/min (ref >60)
Glucose, Bld: 101 mg/dL — ABNORMAL HIGH (ref 70–99)
Potassium: 3.5 mmol/L (ref 3.5–5.1)
Sodium: 149 mmol/L — ABNORMAL HIGH (ref 135–145)
Total Bilirubin: 1.2 mg/dL (ref 0.3–1.2)
Total Protein: 6.2 g/dL — ABNORMAL LOW (ref 6.5–8.1)

## 2022-01-07 LAB — GLUCOSE, CAPILLARY
Glucose-Capillary: 117 mg/dL — ABNORMAL HIGH (ref 70–99)
Glucose-Capillary: 102 mg/dL — ABNORMAL HIGH (ref 70–99)
Glucose-Capillary: 145 mg/dL — ABNORMAL HIGH (ref 70–99)

## 2022-01-07 LAB — PHOSPHORUS: Phosphorus: 2.8 mg/dL (ref 2.5–4.6)

## 2022-01-07 LAB — MAGNESIUM: Magnesium: 2 mg/dL (ref 1.7–2.4)

## 2022-01-07 MED ORDER — POTASSIUM CHLORIDE 20 MEQ PO PACK: 20.0000 meq | PACK | Freq: Once | ORAL | Status: DC

## 2022-01-07 MED ORDER — SODIUM CHLORIDE 0.45 % IV SOLN: INTRAVENOUS | Status: DC

## 2022-01-07 MED ORDER — PANTOPRAZOLE SODIUM 40 MG PO TBEC
40.0000 mg | DELAYED_RELEASE_TABLET | Freq: Every day | ORAL | Status: DC
  Administered 2022-01-07: 40 mg via ORAL
  Filled 2022-01-07: qty 1

## 2022-01-07 MED ORDER — SODIUM CHLORIDE 0.9 % IV SOLN: INTRAVENOUS | Status: DC

## 2022-01-07 NOTE — Consult Note (Signed)
NAME: Charles Charles  DOB: 1961-01-21  MRN: [id number]  Date/Time: 01/07/2022 4:50 PM  REQUESTING PROVIDER: Janine Limbo Subjective:  REASON FOR CONSULT: HIV ? Charles Charles is a 61 y.o. male with a history of HIV, CVA  and polysubstance use, treated syphilis,  was brought in by EMS for altered mental status and not breathing right. Was given Narcan  Pt apparently was growling and biting people- Initial BP 60/30 and he needed versed  and then haldol to calm him . HE was intubated in the ED a Urine tox screen positive for cocaine, cannbinoid HE has bene extubated and is ready to go home Pt says he was only doing weed! HIV test came positive and I am seeing the patient- HE has known h/o HIV and is followed at [facility]- on Delstrigo- Not taken it for 2 months- not been to the clinic since Jan 2023 - their bridge counselor is trying to reach him-  Pt says he will follow up with me as he lives close by Says in the past he had LP for syphilis and then got 3 shots     Past Medical History:  Diagnosis Date   GERD (gastroesophageal reflux disease)    Headache    Schizo-affective schizophrenia ([facility])     Past Surgical History:  Procedure Laterality Date   COLONOSCOPY WITH PROPOFOL N/A 08/04/2015   Procedure: COLONOSCOPY WITH PROPOFOL;  Surgeon: Midge Minium, MD;  Location: [facility];  Service: Endoscopy;  Laterality: N/A;   INCISION AND DRAINAGE PERIRECTAL ABSCESS N/A 07/14/2015   Procedure: IRRIGATION AND DEBRIDEMENT PERIRECTAL ABSCESS;  Surgeon: Lattie Haw, MD;  Location: [facility];  Service: General;  Laterality: N/A;   INCISION AND DRAINAGE PERIRECTAL ABSCESS N/A 08/14/2015   Procedure: IRRIGATION AND DEBRIDEMENT PERIRECTAL ABSCESS;  Surgeon: Gladis Riffle, MD;  Location: [facility];  Service: General;  Laterality: N/A;    Social History   Socioeconomic History   Marital status: Single    Spouse name: Not on file   Number of children: Not on file   Years of education: Not on file    Highest education level: Not on file  Occupational History   Not on file  Tobacco Use   Smoking status: Every Day    Packs/day: 2.00    Types: Cigarettes   Smokeless tobacco: Never   Tobacco comments:    (1-2 cigs/day)  Substance and Sexual Activity   Alcohol use: Yes    Alcohol/week: 56.0 standard drinks of alcohol    Types: 56 Cans of beer per week    Comment: 2 x 40's daily - pt says no alcohol for several weeks   Drug use: Yes    Types: Cocaine, Marijuana    Comment: back in the days   Sexual activity: Never  Other Topics Concern   Not on file  Social History Narrative   Not on file   Social Determinants of Health   Financial Resource Strain: Not on file  Food Insecurity: Not on file  Transportation Needs: Not on file  Physical Activity: Not on file  Stress: Not on file  Social Connections: Not on file  Intimate Partner Violence: Not on file    Family History  Problem Relation Age of Onset   Anxiety disorder Mother    Diabetes Mother    Diabetes Father    Allergies  Allergen Reactions   Penicillins Anaphylaxis, Hives and Other (See Comments)    Has patient had a PCN reaction causing immediate  rash, facial/tongue/throat swelling, SOB or lightheadedness with hypotension: Yes Has patient had a PCN reaction causing severe rash involving mucus membranes or skin necrosis: No Has patient had a PCN reaction that required hospitalization No Has patient had a PCN reaction occurring within the last 10 years: Yes If all of the above answers are "NO", then may proceed with Cephalosporin use. Other reaction(s): UNKNOWN Has patient had a PCN reaction causing immediate rash, facial/tongue/throat swelling, SOB or lightheadedness with hypotension: Yes Has patient had a PCN reaction causing severe rash involving mucus membranes or skin necrosis: No Has patient had a PCN reaction that required hospitalization No Has patient had a PCN reaction occurring within the last 10 years:  Yes If all of the above answers are "NO", then may proceed with Cephalosporin use.   Sulfa Antibiotics Itching   I? Current Facility-Administered Medications  Medication Dose Route Frequency Provider Last Rate Last Admin   0.45 % sodium chloride infusion   Intravenous Continuous Darlin Priestly, MD       0.9 %  sodium chloride infusion  250 mL Intravenous Continuous Ileana Roup, MD   Stopped at 01/05/22 1901   Chlorhexidine Gluconate Cloth 2 % PADS 6 each  6 each Topical Q0600 Ileana Roup, MD   6 each at 01/07/22 0531   docusate sodium (COLACE) capsule 100 mg  100 mg Oral BID PRN Ezequiel Essex, NP       enoxaparin (LOVENOX) injection 40 mg  40 mg Subcutaneous Q24H Ezequiel Essex, NP   40 mg at 01/07/22 0532   ondansetron (ZOFRAN) injection 4 mg  4 mg Intravenous Q6H PRN Ezequiel Essex, NP       Oral care mouth rinse  15 mL Mouth Rinse PRN Ileana Roup, MD       pantoprazole (PROTONIX) EC tablet 40 mg  40 mg Oral Daily Doroteo Glassman, RPH   40 mg at 01/07/22 1245   polyethylene glycol (MIRALAX / GLYCOLAX) packet 17 g  17 g Oral Daily PRN Ezequiel Essex, NP       potassium chloride (KLOR-CON) packet 20 mEq  20 mEq Oral Once Lowella Bandy, [facility]       sodium chloride flush (NS) 0.9 % injection 10-40 mL  10-40 mL Intracatheter Q12H Ileana Roup, MD   10 mL at 01/07/22 1118   sodium chloride flush (NS) 0.9 % injection 10-40 mL  10-40 mL Intracatheter PRN Ileana Roup, MD         Abtx:  Anti-infectives (From admission, onward)    None       REVIEW OF SYSTEMS:  Const: negative fever, negative chills, negative weight loss Eyes: negative diplopia or visual changes, negative eye pain ENT: negative coryza, negative sore throat Resp: negative cough, hemoptysis, dyspnea Cards: negative for chest pain, palpitations, lower extremity edema GU: negative for frequency, dysuria and hematuria GI: Negative for abdominal pain, diarrhea, bleeding, constipation Skin: negative for rash and  pruritus Heme: negative for easy bruising and gum/nose bleeding MS: negative for myalgias, arthralgias, back pain and muscle weakness Neurolo:negative for headaches, dizziness, vertigo, memory problems  Psych: negative for feelings of anxiety, depression  Endocrine: negative for thyroid, diabetes Allergy/Immunology- as above Objective:  VITALS:  BP 121/72 (BP Location: Left Arm)   Pulse 66   Temp 98.2 F (36.8 C) (Oral)   Resp 14   Ht 5\' 3"  (1.6 m)   Wt 69.8 kg   SpO2 99%   BMI  27.26 kg/m  PHYSICAL EXAM:  General: Alert, cooperative, no distress, appears stated age.  Head: Normocephalic, without obvious abnormality, atraumatic. Eyes: Conjunctivae clear, anicteric sclerae. Pupils are equal ENT Nares normal. No drainage or sinus tenderness. Lips, mucosa, and tongue normal. No Thrush Neck: Supple, symmetrical, no adenopathy, thyroid: non tender no carotid bruit and no JVD. Back: No CVA tenderness. Lungs: Clear to auscultation bilaterally. No Wheezing or Rhonchi. No rales. Heart: Regular rate and rhythm, no murmur, rub or gallop. Abdomen: Soft, non-tender,not distended. Bowel sounds normal. No masses Extremities: atraumatic, no cyanosis. No edema. No clubbing Skin: No rashes or lesions. Or bruising Lymph: Cervical, supraclavicular normal. Neurologic: Grossly non-focal Pertinent Labs Lab Results CBC    Component Value Date/Time   WBC 7.1 01/07/2022 0500   RBC 4.14 (L) 01/07/2022 0500   HGB 13.2 01/07/2022 0500   HGB 14.8 02/24/2020 0955   HCT 39.4 01/07/2022 0500   HCT 43.9 02/24/2020 0955   PLT 239 01/07/2022 0500   PLT 344 02/24/2020 0955   MCV 95.2 01/07/2022 0500   MCV 96 02/24/2020 0955   MCV 96 01/24/2014 0531   MCH 31.9 01/07/2022 0500   MCHC 33.5 01/07/2022 0500   RDW 14.0 01/07/2022 0500   RDW 13.3 02/24/2020 0955   RDW 14.0 01/24/2014 0531   LYMPHSABS 1.9 01/07/2022 0500   LYMPHSABS 2.4 02/24/2020 0955   LYMPHSABS 2.2 01/20/2014 1019   MONOABS 0.7  01/07/2022 0500   MONOABS 0.8 01/20/2014 1019   EOSABS 0.0 01/07/2022 0500   EOSABS 0.3 02/24/2020 0955   EOSABS 0.1 01/20/2014 1019   BASOSABS 0.0 01/07/2022 0500   BASOSABS 0.1 02/24/2020 0955   BASOSABS 0.1 01/20/2014 1019       Latest Ref Rng & Units 01/07/2022    5:00 AM 01/06/2022    3:18 AM 01/05/2022   12:12 PM  CMP  Glucose 70 - 99 mg/dL 381  829  937   BUN 8 - 23 mg/dL 8  10  12    Creatinine 0.61 - 1.24 mg/dL  1.69  6.78   Sodium 135 - 145 mmol/L 149  147  143   Potassium 3.5 - 5.1 mmol/L 3.5  3.2  3.6   Chloride 98 - 111 mmol/L 117  118  107   CO2 22 - 32 mmol/L 25  24  23    Calcium 8.9 - 10.3 mg/dL 8.7  8.5  9.1   Total Protein 6.5 - 8.1 g/dL 6.2     Total Bilirubin 0.3 - 1.2 mg/dL 1.2     Alkaline Phos 38 - 126 U/L 42     AST 15 - 41 U/L 45     ALT 0 - 44 U/L 31         Microbiology: Recent Results (from the past 240 hour(s))  MRSA Next Gen by PCR, Nasal     Status: None   Collection Time: 01/05/22  9:57 AM   Specimen: Nasal Mucosa; Nasal Swab  Result Value Ref Range Status   MRSA by PCR Next Gen NOT DETECTED NOT DETECTED Final    Comment: (NOTE) The GeneXpert MRSA Assay (FDA approved for NASAL specimens only), is one component of a comprehensive MRSA colonization surveillance program. It is not intended to diagnose MRSA infection nor to guide or monitor treatment for MRSA infections. Test performance is not FDA approved in patients less than 30 years old. Performed at [facility], [address] [place], [address] [place]    02/26/2017 CSF VDRL  negative CSF wbc 4 02/25/27 Syphilis screen positive ( no titres) 07/17/21- RPR 1:4  HIV RNA 3612 on 07/17/21 Cd4 - 1488 (62%)   IMAGING RESULTS: CXR  Low lung volume I have personally reviewed the films ? Impression/Recommendation  Drug OD with resp failure- s/p intubation,self extubated on 03/09/22 Being discharged today  ?HIV- diagnosed as per [facility] 2020 ( as per patient 2000)and was  started on Delstrigo- last VL 3612 and Cd4  is 1488 from jan 2023 Not  adherent to visits and meds  I will inform [facility] bridge counselor Pt says he wants to follow up locally Gave him an appt to see me on 01/10/22  Treated Syphilis   2021  X 3 doses of PCN ( neg VDRL in [facility] in 2018)  H/o schizophrenia    ? ? ___________________________________________________ Discussed with patient, in detail Follow up this thursday Note:  This document was prepared using Dragon voice recognition software and may include unintentional dictation errors.

## 2022-01-07 NOTE — Discharge Summary (Signed)
Physician Discharge Summary   Charles Charles  male DOB: 07-05-1960  [id number]  PCP: [facility] [place] date: 01/05/2022 Discharge date: 01/07/2022  Admitted From: home Disposition:  home CODE STATUS: Full code   Hospital Course:  For full details, please see H&P, progress notes, consult notes and ancillary notes.  Briefly,  Charles Charles is a 61 year old African-American male history of schizophrenia, HIV, CVA, polysubstance use, treated syphilis who was with his nephew doing drugs and later family checked on them they were unresponsive.  Per EMS, patient was very agitated and at some point trying to bite them but then he became very unresponsive.  EMS gave Narcan with no effect so was brought to the hospital.  GCS was 3 pt was intubated for airway protection and admitted to ICU.  Pt self-extubated on 7/9 and was placed on 2L [place].  Pt was transferred to hospitalist service on 7/10, and was able to be weaned down room air.  Mental status was back to normal baseline and pt was able to ambulate before discharge.  Acute hypoxic respiratory failure secondary to aspiration in the setting of drug overdose Mechanical intubation  Hypernatremia, mild Likely due to free water deficit from not having oral hydration.  Na should correct itself after pt starts back on normal oral intake.  Hypokalemia  --monitored and replete PRN   Acute toxic metabolic encephalopathy suspected secondary to drug overdose Hx: Cocaine abuse UDS pos for cocaine and cannabinoid  HIV Medication non-compliance HIV test came positive and ID Dr. Delaine Lame saw pt prior to discharge. --pt has known h/o HIV and is followed at [facility]- Not taken it for 2 months- not been to the clinic since Jan 2023 - their bridge counselor is trying to reach him  --pt decided to f/u with Dr. Delaine Lame  Unless noted above, medications under "STOP" list are ones pt was not taking PTA.  Discharge Diagnoses:   Principal Problem:   Drug overdose   30 Day Unplanned Readmission Risk Score    Flowsheet Row ED to Hosp-Admission (Current) from 01/05/2022 in [place] ICU/CCU  30 Day Unplanned Readmission Risk Score (%) 13.4 Filed at 01/07/2022 1600       This score is the patient's risk of an unplanned readmission within 30 days of being discharged (0 -100%). The score is based on dignosis, age, lab data, medications, orders, and past utilization.   Low:  0-14.9   Medium: 15-21.9   High: 22-29.9   Extreme: 30 and above         Discharge Instructions:  Allergies as of 01/07/2022       Reactions   Penicillins Anaphylaxis, Hives, Other (See Comments)   Has patient had a PCN reaction causing immediate rash, facial/tongue/throat swelling, SOB or lightheadedness with hypotension: Yes Has patient had a PCN reaction causing severe rash involving mucus membranes or skin necrosis: No Has patient had a PCN reaction that required hospitalization No Has patient had a PCN reaction occurring within the last 10 years: Yes If all of the above answers are "NO", then may proceed with Cephalosporin use. Other reaction(s): UNKNOWN Has patient had a PCN reaction causing immediate rash, facial/tongue/throat swelling, SOB or lightheadedness with hypotension: Yes Has patient had a PCN reaction causing severe rash involving mucus membranes or skin necrosis: No Has patient had a PCN reaction that required hospitalization No Has patient had a PCN reaction occurring within the last 10 years: Yes If all of  the above answers are "NO", then may proceed with Cephalosporin use.   Sulfa Antibiotics Itching        Medication List     STOP taking these medications    aspirin EC 81 MG tablet   DELSTRIGO PO   famotidine 20 MG tablet Commonly known as: Pepcid   haloperidol decanoate 100 MG/ML injection Commonly known as: HALDOL DECANOATE   ibuprofen 600 MG tablet Commonly known as: ADVIL    isoniazid 300 MG tablet Commonly known as: NYDRAZID   levothyroxine 50 MCG tablet Commonly known as: SYNTHROID   traZODone 50 MG tablet Commonly known as: DESYREL       TAKE these medications    omeprazole 20 MG capsule Commonly known as: PRILOSEC Take 20 mg by mouth daily.   Plavix 75 MG tablet Generic drug: clopidogrel Take 75 mg by mouth daily.   Zetia 10 MG tablet Generic drug: ezetimibe Take 10 mg by mouth daily.         Follow-up Information     [facility], [place] Follow up in 1 week(s).   Contact information: [place] [address] [phone]                 Allergies  Allergen Reactions   Penicillins Anaphylaxis, Hives and Other (See Comments)    Has patient had a PCN reaction causing immediate rash, facial/tongue/throat swelling, SOB or lightheadedness with hypotension: Yes Has patient had a PCN reaction causing severe rash involving mucus membranes or skin necrosis: No Has patient had a PCN reaction that required hospitalization No Has patient had a PCN reaction occurring within the last 10 years: Yes If all of the above answers are "NO", then may proceed with Cephalosporin use. Other reaction(s): UNKNOWN Has patient had a PCN reaction causing immediate rash, facial/tongue/throat swelling, SOB or lightheadedness with hypotension: Yes Has patient had a PCN reaction causing severe rash involving mucus membranes or skin necrosis: No Has patient had a PCN reaction that required hospitalization No Has patient had a PCN reaction occurring within the last 10 years: Yes If all of the above answers are "NO", then may proceed with Cephalosporin use.   Sulfa Antibiotics Itching     The results of significant diagnostics from this hospitalization (including imaging, microbiology, ancillary and laboratory) are listed below for reference.   Consultations:   Procedures/Studies: DG Knee 1-2 Views Right  Result Date:  01/07/2022 CLINICAL DATA:  Right knee pain.  No trauma history submitted. EXAM: RIGHT KNEE - 1-2 VIEW COMPARISON:  None Available. FINDINGS: No acute fracture or dislocation. Osseous irregularity about the medial femoral epicondyle is likely related to remote MCL injury. No joint effusion. Joint spaces are maintained for age. IMPRESSION: No acute osseous abnormality. Electronically Signed   By: Abigail Miyamoto M.D.   On: 01/07/2022 13:51   ECHOCARDIOGRAM COMPLETE  Result Date: 01/05/2022    ECHOCARDIOGRAM REPORT   Patient Name:   KIDD ASCHENBRENNER Date of Exam: 01/05/2022 Medical Rec #:  [id number]    Height:       63.0 in Accession #:    [id number]   Weight:       151.2 lb Date of Birth:  [birth year]     BSA:          1.717 m Patient Age:    58 years     BP:           105/74 mmHg Patient Gender: M  HR:           74 bpm. Exam Location:  [facility] Procedure: 2D Echo Indications:     Acute Respiratory Distress  History:         Patient has prior history of Echocardiogram examinations, most                  recent 04/16/2016.  Sonographer:     Overton Mam RDCS Referring Phys:  [id number] Ezequiel Essex Diagnosing Phys: Sena Slate  Sonographer Comments: Technically difficult study due to poor echo windows and echo performed with patient supine and on artificial respirator. Image acquisition challenging due to respiratory motion. IMPRESSIONS  1. Left ventricular ejection fraction, by estimation, is 50 to 55%. The left ventricle has low normal function. The left ventricle has no regional wall motion abnormalities. Left ventricular diastolic parameters were normal.  2. Right ventricular systolic function is normal. The right ventricular size is normal.  3. The mitral valve is normal in structure. No evidence of mitral valve regurgitation.  4. The aortic valve is tricuspid. Aortic valve regurgitation is not visualized. No aortic stenosis is present. FINDINGS  Left Ventricle: Endocardial definition is poor, and respiratory  motion limits ability to fully evaluate LV function. Appears low normal. Left ventricular ejection fraction, by estimation, is 50 to 55%. The left ventricle has low normal function. The left ventricle has no regional wall motion abnormalities. The left ventricular internal cavity size was normal in size. There is no left ventricular hypertrophy. Left ventricular diastolic parameters were normal. Right Ventricle: The right ventricular size is normal. Right vetricular wall thickness was not well visualized. Right ventricular systolic function is normal. Left Atrium: Left atrial size was normal in size. Right Atrium: Right atrial size was normal in size. Pericardium: There is no evidence of pericardial effusion. Mitral Valve: The mitral valve is normal in structure. No evidence of mitral valve regurgitation. Tricuspid Valve: The tricuspid valve is grossly normal. Aortic Valve: The aortic valve is tricuspid. Aortic valve regurgitation is not visualized. No aortic stenosis is present. Aortic valve peak gradient measures 3.4 mmHg. Pulmonic Valve: The pulmonic valve was not well visualized. Aorta: The aortic root is normal in size and structure. Venous: IVC assessment for right atrial pressure unable to be performed due to mechanical ventilation. IAS/Shunts: The interatrial septum was not well visualized.  LEFT VENTRICLE PLAX 2D LVIDd:         3.76 cm     Diastology LVIDs:         2.59 cm     LV e' medial:   6.96 cm/s LV PW:         0.92 cm     LV E/e' medial: 5.8 LV IVS:        0.76 cm LVOT diam:     2.20 cm LV SV:         45 LV SV Index:   26 LVOT Area:     3.80 cm  LV Volumes (MOD) LV vol d, MOD A4C: 82.0 ml LV vol s, MOD A4C: 32.1 ml LV SV MOD A4C:     82.0 ml RIGHT VENTRICLE RV Basal diam:  2.70 cm TAPSE (M-mode): 1.8 cm LEFT ATRIUM             Index        RIGHT ATRIUM          Index LA diam:        3.10 cm 1.81 cm/m   RA  Area:     6.90 cm LA Vol (A2C):   21.3 ml 12.40 ml/m  RA Volume:   14.20 ml 8.27 ml/m LA  Vol (A4C):   15.4 ml 8.97 ml/m LA Biplane Vol: 19.6 ml 11.41 ml/m  AORTIC VALVE                 PULMONIC VALVE AV Area (Vmax): 3.01 cm     PV Vmax:       0.71 m/s AV Vmax:        92.30 cm/s   PV Peak grad:  2.0 mmHg AV Peak Grad:   3.4 mmHg LVOT Vmax:      73.10 cm/s LVOT Vmean:     42.000 cm/s LVOT VTI:       0.118 m  AORTA Ao Root diam: 3.40 cm MITRAL VALVE MV Area (PHT): 3.68 cm    SHUNTS MV Decel Time: 206 msec    Systemic VTI:  0.12 m MV E velocity: 40.30 cm/s  Systemic Diam: 2.20 cm MV A velocity: 52.30 cm/s MV E/A ratio:  0.77 Sena Slate Electronically signed by Sena Slate Signature Date/Time: 01/05/2022/4:04:49 PM    Final    [place] EKG SITE RITE  Result Date: 01/05/2022 If Site Rite image not attached, placement could not be confirmed due to current cardiac rhythm.  CT HEAD WO CONTRAST ( )  Result Date: 01/05/2022 CLINICAL DATA:  History of schizophrenia. Suspected drug overdose. Altered mental status. EXAM: CT HEAD WITHOUT CONTRAST CT CERVICAL SPINE WITHOUT CONTRAST TECHNIQUE: Multidetector CT imaging of the head and cervical spine was performed following the standard protocol without intravenous contrast. Multiplanar CT image reconstructions of the cervical spine were also generated. RADIATION DOSE REDUCTION: This exam was performed according to the departmental dose-optimization program which includes automated exposure control, adjustment of the mA and/or kV according to patient size and/or use of iterative reconstruction technique. COMPARISON:  12/23/2019 FINDINGS: CT HEAD FINDINGS Brain: No evidence of acute infarction, hemorrhage, hydrocephalus, extra-axial collection or mass lesion/mass effect. Asymmetric focal area of low attenuation within the left posterior frontal cortex and insular cortex is concerning chronic infarct. Vascular: No hyperdense vessel or unexpected calcification. Skull: Normal. Negative for fracture or focal lesion. Sinuses/Orbits: Mastoid air cells are clear. Partial  opacification of the ethmoid air cells. Other: None. CT CERVICAL SPINE FINDINGS Alignment: Normal. Skull base and vertebrae: No acute fracture. No primary bone lesion or focal pathologic process. Soft tissues and spinal canal: No prevertebral fluid or swelling. No visible canal hematoma. Disc levels: Multi level endplate spur scratch set endplate spurring is identified at C5-6, C6-7 and C7-T1, mild. Upper chest: Negative. Other: None IMPRESSION: 1. No acute intracranial abnormality. 2. Asymmetric focal area of low attenuation within the left posterior frontal cortex and insular cortex is concerning for chronic infarct. 3. No evidence for cervical spine fracture or subluxation. 4. Cervical spondylosis. Electronically Signed   By: Signa Kell M.D.   On: 01/05/2022 10:24   CT Cervical Spine Wo Contrast  Result Date: 01/05/2022 CLINICAL DATA:  History of schizophrenia. Suspected drug overdose. Altered mental status. EXAM: CT HEAD WITHOUT CONTRAST CT CERVICAL SPINE WITHOUT CONTRAST TECHNIQUE: Multidetector CT imaging of the head and cervical spine was performed following the standard protocol without intravenous contrast. Multiplanar CT image reconstructions of the cervical spine were also generated. RADIATION DOSE REDUCTION: This exam was performed according to the departmental dose-optimization program which includes automated exposure control, adjustment of the mA and/or kV according to patient size and/or use of  iterative reconstruction technique. COMPARISON:  12/23/2019 FINDINGS: CT HEAD FINDINGS Brain: No evidence of acute infarction, hemorrhage, hydrocephalus, extra-axial collection or mass lesion/mass effect. Asymmetric focal area of low attenuation within the left posterior frontal cortex and insular cortex is concerning chronic infarct. Vascular: No hyperdense vessel or unexpected calcification. Skull: Normal. Negative for fracture or focal lesion. Sinuses/Orbits: Mastoid air cells are clear. Partial  opacification of the ethmoid air cells. Other: None. CT CERVICAL SPINE FINDINGS Alignment: Normal. Skull base and vertebrae: No acute fracture. No primary bone lesion or focal pathologic process. Soft tissues and spinal canal: No prevertebral fluid or swelling. No visible canal hematoma. Disc levels: Multi level endplate spur scratch set endplate spurring is identified at C5-6, C6-7 and C7-T1, mild. Upper chest: Negative. Other: None IMPRESSION: 1. No acute intracranial abnormality. 2. Asymmetric focal area of low attenuation within the left posterior frontal cortex and insular cortex is concerning for chronic infarct. 3. No evidence for cervical spine fracture or subluxation. 4. Cervical spondylosis. Electronically Signed   By: Kerby Moors M.D.   On: 01/05/2022 10:24   DG Chest 1 View  Result Date: 01/05/2022 CLINICAL DATA:  Hypoxia. EXAM: CHEST  1 VIEW COMPARISON:  10/25/2019 FINDINGS: ETT tip is 3.7 cm above the carina. Enteric tube is in place with tip and side port below the GE junction. Heart size and mediastinal contours appear normal. Lung volumes are low. There is mild platelike atelectasis in the lung bases. No pleural effusion or edema. No airspace consolidation. IMPRESSION: Low lung volumes with bibasilar atelectasis.  The Electronically Signed   By: Kerby Moors M.D.   On: 01/05/2022 06:51      Labs: BNP (last 3 results) No results for input(s): "BNP" in the last 8760 hours. Basic Metabolic Panel: Recent Labs  Lab 01/05/22 0542 01/05/22 1212 01/06/22 0318 01/07/22 0500  NA 138 143 147* 149*  K 3.4* 3.6 3.2* 3.5  CL 102 107 118* 117*  CO2 26 23 24 25   GLUCOSE 144* 100* 108* 101*  BUN 13 12 10 8   CREATININE 1.25* 0.98 0.88 0.87  CALCIUM 9.2 9.1 8.5* 8.7*  MG  --   --  2.3 2.0  PHOS  --   --  2.7 2.8   Liver Function Tests: Recent Labs  Lab 01/05/22 0542 01/07/22 0500  AST 34 45*  ALT 30 31  ALKPHOS 55 42  BILITOT 0.7 1.2  PROT 7.6 6.2*  ALBUMIN 3.7 2.7*   No  results for input(s): "LIPASE", "AMYLASE" in the last 168 hours. No results for input(s): "AMMONIA" in the last 168 hours. CBC: Recent Labs  Lab 01/05/22 0542 01/06/22 0318 01/07/22 0500  WBC 9.8 6.5 7.1  NEUTROABS 7.2  --  4.4  HGB 15.2 13.1 13.2  HCT 44.5 38.1* 39.4  MCV 93.3 93.6 95.2  PLT 292 258 239   Cardiac Enzymes: No results for input(s): "CKTOTAL", "CKMB", "CKMBINDEX", "TROPONINI" in the last 168 hours. BNP: Invalid input(s): "POCBNP" CBG: Recent Labs  Lab 01/06/22 1939 01/06/22 2352 01/07/22 0331 01/07/22 0716 01/07/22 1142  GLUCAP 104* 106* 117* 102* 145*   D-Dimer No results for input(s): "DDIMER" in the last 72 hours. Hgb A1c No results for input(s): "HGBA1C" in the last 72 hours. Lipid Profile Recent Labs    01/06/22 0320  TRIG 206*   Thyroid function studies No results for input(s): "TSH", "T4TOTAL", "T3FREE", "THYROIDAB" in the last 72 hours.  Invalid input(s): "FREET3" Anemia work up No results for input(s): "VITAMINB12", "FOLATE", "FERRITIN", "  TIBC", "IRON", "RETICCTPCT" in the last 72 hours. Urinalysis    Component Value Date/Time   COLORURINE YELLOW (A) 01/05/2022 0604   APPEARANCEUR CLEAR (A) 01/05/2022 0604   APPEARANCEUR Clear 01/24/2014 1250   LABSPEC 1.013 01/05/2022 0604   LABSPEC 1.010 01/24/2014 1250   PHURINE 5.0 01/05/2022 0604   GLUCOSEU NEGATIVE 01/05/2022 0604   GLUCOSEU Negative 01/24/2014 1250   HGBUR SMALL (A) 01/05/2022 0604   BILIRUBINUR NEGATIVE 01/05/2022 0604   BILIRUBINUR Negative 01/24/2014 1250   KETONESUR NEGATIVE 01/05/2022 0604   PROTEINUR NEGATIVE 01/05/2022 0604   NITRITE NEGATIVE 01/05/2022 0604   LEUKOCYTESUR NEGATIVE 01/05/2022 0604   LEUKOCYTESUR Negative 01/24/2014 1250   Sepsis Labs Recent Labs  Lab 01/05/22 0542 01/06/22 0318 01/07/22 0500  WBC 9.8 6.5 7.1   Microbiology Recent Results (from the past 240 hour(s))  MRSA Next Gen by PCR, Nasal     Status: None   Collection Time: 01/05/22   9:57 AM   Specimen: Nasal Mucosa; Nasal Swab  Result Value Ref Range Status   MRSA by PCR Next Gen NOT DETECTED NOT DETECTED Final    Comment: (NOTE) The GeneXpert MRSA Assay (FDA approved for NASAL specimens only), is one component of a comprehensive MRSA colonization surveillance program. It is not intended to diagnose MRSA infection nor to guide or monitor treatment for MRSA infections. Test performance is not FDA approved in patients less than 59 years old. Performed at [facility], [address] [place], [place] [address]      Total time spend on discharging this patient, including the last patient exam, discussing the hospital stay, instructions for ongoing care as it relates to all pertinent caregivers, as well as preparing the medical discharge records, prescriptions, and/or referrals as applicable, is 50 minutes.    Enzo Bi, MD  [facility] 01/07/2022, 4:07 PM

## 2022-01-07 NOTE — Progress Notes (Signed)
PHARMACY CONSULT NOTE  Pharmacy Consult for Electrolyte Monitoring and Replacement   Recent Labs: Potassium (mmol/L)  Date Value  01/07/2022 3.5  01/24/2014 3.6   Magnesium (mg/dL)  Date Value  19/16/6060 2.0   Calcium (mg/dL)  Date Value  04/59/9774 8.7 (L)   Calcium, Total (mg/dL)  Date Value  14/23/9532 8.7   Albumin (g/dL)  Date Value  02/33/4356 2.7 (L)  04/19/2020 4.3  01/24/2014 2.9 (L)   Phosphorus (mg/dL)  Date Value  86/16/8372 2.8   Sodium (mmol/L)  Date Value  01/07/2022 149 (H)  01/24/2014 141     Assessment: Pharmacy has been consulted to monitor and replace electrolytes in 61yo patient that was found unresponsive at his home by family members. Patient has a history of cocaine use and is believe to have had a drug overdose. Patient was given narcan with no effect, and is currently intubated.  IVF: NS@50ml /h  Goal of Therapy:  Electrolytes WNL   Plan:  --20 mEq oral KCl x 1 - Will recheck electrolytes with AM labs  Lowella Bandy ,PharmD Clinical Pharmacist 01/07/2022 6:53 AM

## 2022-01-08 LAB — HIV-1/2 AB - DIFFERENTIATION
HIV 1 Ab: REACTIVE
HIV 2 Ab: NONREACTIVE

## 2022-01-08 LAB — HIV-1 RNA QUANT-NO REFLEX-BLD
HIV 1 RNA Quant: 460 copies/mL
LOG10 HIV-1 RNA: 2.663 log10copy/mL

## 2022-01-08 LAB — HIV-1/HIV-2 QUAL RNA
HIV-1 RNA, Qualitative: REACTIVE — AB
HIV-2 RNA, Qualitative: NONREACTIVE

## 2022-01-10 ENCOUNTER — Inpatient Hospital Stay: Payer: Medicaid Other | Admitting: Infectious Diseases

## 2022-01-10 LAB — BLOOD GAS, ARTERIAL
Acid-Base Excess: 0.8 mmol/L (ref 0.0–2.0)
Bicarbonate: 26 mmol/L (ref 20.0–28.0)
FIO2: 50 %
O2 Saturation: 99.9 %
PEEP: 5 cmH2O
Patient temperature: 37
Pressure control: 25 cmH2O
RATE: 20 resp/min
pCO2 arterial: 43 mmHg (ref 32–48)
pH, Arterial: 7.39 (ref 7.35–7.45)
pO2, Arterial: 155 mmHg — ABNORMAL HIGH (ref 83–108)

## 2022-12-02 ENCOUNTER — Inpatient Hospital Stay
Admission: RE | Admit: 2022-12-02 | Payer: No Typology Code available for payment source | Source: Intra-hospital | Admitting: Psychiatry

## 2022-12-02 ENCOUNTER — Ambulatory Visit (HOSPITAL_COMMUNITY)
Admission: EM | Admit: 2022-12-02 | Discharge: 2022-12-02 | Disposition: A | Discharge status: Other Acute Inpatient Hospital | Payer: No Typology Code available for payment source | Attending: Psychiatry | Admitting: Psychiatry

## 2022-12-02 DIAGNOSIS — Z8673 Personal history of transient ischemic attack (TIA), and cerebral infarction without residual deficits: Secondary | ICD-10-CM | POA: Insufficient documentation

## 2022-12-02 DIAGNOSIS — J449 Chronic obstructive pulmonary disease, unspecified: Secondary | ICD-10-CM | POA: Insufficient documentation

## 2022-12-02 DIAGNOSIS — F1414 Cocaine abuse with cocaine-induced mood disorder: Secondary | ICD-10-CM | POA: Insufficient documentation

## 2022-12-02 DIAGNOSIS — Z21 Asymptomatic human immunodeficiency virus [HIV] infection status: Secondary | ICD-10-CM | POA: Insufficient documentation

## 2022-12-02 DIAGNOSIS — F1494 Cocaine use, unspecified with cocaine-induced mood disorder: Secondary | ICD-10-CM

## 2022-12-02 DIAGNOSIS — R443 Hallucinations, unspecified: Secondary | ICD-10-CM

## 2022-12-02 DIAGNOSIS — Z79899 Other long term (current) drug therapy: Secondary | ICD-10-CM | POA: Insufficient documentation

## 2022-12-02 DIAGNOSIS — E785 Hyperlipidemia, unspecified: Secondary | ICD-10-CM | POA: Insufficient documentation

## 2022-12-02 DIAGNOSIS — Z7982 Long term (current) use of aspirin: Secondary | ICD-10-CM | POA: Insufficient documentation

## 2022-12-02 DIAGNOSIS — F32A Depression, unspecified: Secondary | ICD-10-CM | POA: Insufficient documentation

## 2022-12-02 DIAGNOSIS — F419 Anxiety disorder, unspecified: Secondary | ICD-10-CM | POA: Insufficient documentation

## 2022-12-02 DIAGNOSIS — F259 Schizoaffective disorder, unspecified: Secondary | ICD-10-CM | POA: Insufficient documentation

## 2022-12-02 LAB — POCT URINE DRUG SCREEN - MANUAL ENTRY (I-SCREEN)
POC Amphetamine UR: NOT DETECTED
POC Buprenorphine (BUP): NOT DETECTED
POC Cocaine UR: POSITIVE — AB
POC Marijuana UR: POSITIVE — AB
POC Methadone UR: NOT DETECTED
POC Methamphetamine UR: NOT DETECTED
POC Morphine: NOT DETECTED
POC Oxazepam (BZO): NOT DETECTED
POC Oxycodone UR: NOT DETECTED
POC Secobarbital (BAR): NOT DETECTED

## 2022-12-02 LAB — COMPREHENSIVE METABOLIC PANEL
ALT: 24 U/L (ref 0–44)
AST: 21 U/L (ref 15–41)
Albumin: 3.1 g/dL — ABNORMAL LOW (ref 3.5–5.0)
Alkaline Phosphatase: 66 U/L (ref 38–126)
Anion gap: 7 (ref 5–15)
BUN: 11 mg/dL (ref 8–23)
CO2: 26 mmol/L (ref 22–32)
Calcium: 9.5 mg/dL (ref 8.9–10.3)
Chloride: 106 mmol/L (ref 98–111)
Creatinine, Ser: 0.89 mg/dL (ref 0.61–1.24)
GFR, Estimated: >60 mL/min (ref >60)
Glucose, Bld: 99 mg/dL (ref 70–99)
Potassium: 4 mmol/L (ref 3.5–5.1)
Sodium: 139 mmol/L (ref 135–145)
Total Bilirubin: 0.4 mg/dL (ref 0.3–1.2)
Total Protein: 6.4 g/dL — ABNORMAL LOW (ref 6.5–8.1)

## 2022-12-02 LAB — CBC WITH DIFFERENTIAL/PLATELET
Abs Immature Granulocytes: 0.07 10*3/uL (ref 0.00–0.07)
Basophils Absolute: 0.1 10*3/uL (ref 0.0–0.1)
Basophils Relative: 1 %
Eosinophils Absolute: 0.3 10*3/uL (ref 0.0–0.5)
Eosinophils Relative: 4 %
HCT: 39.2 % (ref 39.0–52.0)
Hemoglobin: 13.5 g/dL (ref 13.0–17.0)
Immature Granulocytes: 1 %
Lymphocytes Relative: 56 %
Lymphs Abs: 3.6 10*3/uL (ref 0.7–4.0)
MCH: 31.5 pg (ref 26.0–34.0)
MCHC: 34.4 g/dL (ref 30.0–36.0)
MCV: 91.4 fL (ref 80.0–100.0)
Monocytes Absolute: 0.9 10*3/uL (ref 0.1–1.0)
Monocytes Relative: 15 %
Neutro Abs: 1.4 10*3/uL — ABNORMAL LOW (ref 1.7–7.7)
Neutrophils Relative %: 23 %
Platelets: 292 10*3/uL (ref 150–400)
RBC: 4.29 MIL/uL (ref 4.22–5.81)
RDW: 13.3 % (ref 11.5–15.5)
WBC: 6.3 10*3/uL (ref 4.0–10.5)
nRBC: 0 % (ref 0.0–0.2)

## 2022-12-02 LAB — MAGNESIUM: Magnesium: 2.1 mg/dL (ref 1.7–2.4)

## 2022-12-02 LAB — ETHANOL: Alcohol, Ethyl (B): <10 mg/dL (ref <10)

## 2022-12-02 LAB — TSH: TSH: 5.379 u[IU]/mL — ABNORMAL HIGH (ref 0.350–4.500)

## 2022-12-02 LAB — LIPID PANEL
Cholesterol: 167 mg/dL (ref 0–200)
HDL: 47 mg/dL (ref >40)
LDL Cholesterol: 91 mg/dL (ref 0–99)
Total CHOL/HDL Ratio: 3.6 RATIO
Triglycerides: 144 mg/dL (ref <150)
VLDL: 29 mg/dL (ref 0–40)

## 2022-12-02 MED ORDER — ASPIRIN 81 MG PO CHEW: 324.0000 mg | CHEWABLE_TABLET | Freq: Once | ORAL | Status: DC

## 2022-12-02 MED ORDER — UMECLIDINIUM-VILANTEROL 62.5-25 MCG/ACT IN AEPB
1.0000 | INHALATION_SPRAY | Freq: Every day | RESPIRATORY_TRACT | Status: DC
  Filled 2022-12-02: qty 14

## 2022-12-02 MED ORDER — HYDROCORTISONE 1 % EX CREA
TOPICAL_CREAM | Freq: Two times a day (BID) | CUTANEOUS | Status: DC
  Filled 2022-12-02: qty 28

## 2022-12-02 MED ORDER — ACETAMINOPHEN 325 MG PO TABS: 650.0000 mg | ORAL_TABLET | Freq: Four times a day (QID) | ORAL | Status: DC | PRN

## 2022-12-02 MED ORDER — ALBUTEROL SULFATE HFA 108 (90 BASE) MCG/ACT IN AERS: 2.0000 | INHALATION_SPRAY | Freq: Two times a day (BID) | RESPIRATORY_TRACT | Status: DC | PRN

## 2022-12-02 MED ORDER — PANTOPRAZOLE SODIUM 20 MG PO TBEC: 20.0000 mg | DELAYED_RELEASE_TABLET | Freq: Every day | ORAL | Status: DC

## 2022-12-02 MED ORDER — OLANZAPINE 10 MG PO TABS
10.0000 mg | ORAL_TABLET | Freq: Every day | ORAL | Status: DC
  Administered 2022-12-02: 10 mg via ORAL
  Filled 2022-12-02: qty 1

## 2022-12-02 MED ORDER — MAGNESIUM HYDROXIDE 400 MG/5ML PO SUSP: 30.0000 mL | Freq: Every day | ORAL | Status: DC | PRN

## 2022-12-02 MED ORDER — HYDROXYZINE HCL 25 MG PO TABS
25.0000 mg | ORAL_TABLET | Freq: Three times a day (TID) | ORAL | Status: DC | PRN
  Administered 2022-12-02: 25 mg via ORAL
  Filled 2022-12-02: qty 1

## 2022-12-02 MED ORDER — ASPIRIN 81 MG PO CHEW: 81.0000 mg | CHEWABLE_TABLET | Freq: Every day | ORAL | Status: DC

## 2022-12-02 MED ORDER — TRAZODONE HCL 50 MG PO TABS
50.0000 mg | ORAL_TABLET | Freq: Every evening | ORAL | Status: DC | PRN
  Administered 2022-12-02: 50 mg via ORAL
  Filled 2022-12-02: qty 1

## 2022-12-02 MED ORDER — ALUM & MAG HYDROXIDE-SIMETH 200-200-20 MG/5ML PO SUSP: 30.0000 mL | ORAL | Status: DC | PRN

## 2022-12-02 NOTE — Progress Notes (Signed)
   12/02/22 1744  [facility] Triage Screening (Walk-ins at [facility] only)  How Did You Hear About [place]? Family/Friend  What Is the Reason for Your Visit/Call Today? Charles Charles is a 62 year old male presenting to [facility] voluntarily with chief complaint of needing to get off crack and hearing voices telling him to hurt himself and others. PT reports he has been using crack since age 60. Pt is using about $40 worth of crack daily. Pt also reports that he need medications for HIV. Pt last crack use was three days ago. Daughter reports that pt called her on Sunday reporting that he wanted to kill himself because of the voices. Pt denies current SI, HI, AVH.  How Long Has This Been Causing You Problems? > than 6 months  Have You Recently Had Any Thoughts About Hurting Yourself? No  Are You Planning to Commit Suicide/Harm Yourself At This time? No  Have you Recently Had Thoughts About Hurting Someone Karolee Ohs? No  Are You Planning To Harm Someone At This Time? No  Are you currently experiencing any auditory, visual or other hallucinations? Yes  Please explain the hallucinations you are currently experiencing: reports hearing vocies during triage but does not appear to be responding to internal stimuli  Have You Used Any Alcohol or Drugs in the Past 24 Hours? No  How long ago did you use Drugs or Alcohol? last use was Saturday  What Did You Use and How Much? Almost entire disability check  Do you have any current medical co-morbidities that require immediate attention? No  Clinician description of patient physical appearance/behavior: calm  What Do You Feel Would Help You the Most Today? Alcohol or Drug Use Treatment  If access to [facility] was not available, would you have sought care in the Emergency Department? No  Determination of Need Routine (7 days)  Options For Referral Facility-Based Crisis

## 2022-12-02 NOTE — Progress Notes (Signed)
Pt was accepted to [facility] TODAY 12/02/2022; Bed Assignment [facility] 400-2 PENDING signed consent faxed to [facility] [phone].   PER [facility] please pre-admit.   DX: cocaine induced mood disorder: hallucinations.    Pt meets inpatient criteria per Julaine Fusi   Attending Physician will be Dr. Phineas Inches, MD  Report can be called to: -Adult unit: [phone]   Pt can arrive after: [facility] to coordinate with the care team.   Care Team notified: Night [facility] Fransico Michael, RN, Julaine Fusi, Chinwendu Onuoha V.NP, Vernard Gambles, Tyrell Antonio, RN, York Pellant, NP, Shelia Media, RN, Verlon Setting, LPN, Angeline Slim, RN, Day [facility] Rona Ravens, RN,Japonia Core   [place], LCSWA 12/02/2022 @ 10:47 PM

## 2022-12-02 NOTE — ED Notes (Signed)
Patient discharge via ambulatory with a steady gait with Safe Transport staff member. Respirations equal and unlabored, skin warm and dry. No acute distress noted.  

## 2022-12-02 NOTE — ED Notes (Signed)
Pt refused to transfer to [facility] but agreed to [facility].  Report called to [place], Charity fundraiser.

## 2022-12-02 NOTE — Progress Notes (Addendum)
Pt was accepted to [facility] TODAY 12/02/2022; Bed Assignment 312 PENDING when medically cleared. Please fax the signed vol consent to [phone].  PER [facility] please pre-admit.  DX: cocaine induced mood disorder: hallucinations.   Pt meets inpatient criteria per Julaine Fusi  Attending Physician will be Dr. Mordecai Rasmussen, MD  Report can be called to: [phone]  Pt can arrive after: [facility] AC and Charge BMU RN to coordinate with the care team.  Care Team notified: Night [facility] Fransico Michael, RN, Julaine Fusi, Chinwendu Onuoha V.NP, Vernard Gambles, Tyrell Antonio, RN, York Pellant, NP, Shelia Media, RN, Verlon Setting, LPN, Angeline Slim, RN, Day [facility] Rona Ravens, RN   [place], LCSWA 12/02/2022 @ 9:24 PM

## 2022-12-02 NOTE — ED Notes (Signed)
Safe trasnport notified

## 2022-12-02 NOTE — ED Notes (Signed)
[facility] courier called to collect STAT specimens and to deliver to [facility]. 

## 2022-12-02 NOTE — ED Provider Notes (Cosign Needed Addendum)
[facility] Continuous Assessment Admission H&P  Date: 12/02/22 Patient Name: Charles Charles MRN: [id number] Chief Complaint:  increased depression, anxiety, auditory hallucinations and requesting substance abuse treatment for cocaine abus  Diagnoses:  Final diagnoses:  Cocaine-induced mood disorder (HCC)  Hallucinations    [id number] presented to [facility] as a walk in accompanied by his daughter and multiple family members with complaints of increased depression, anxiety, auditory hallucinations and requesting substance abuse treatment for cocaine abuse  Charles Charles, 62 y.o., male patient seen face to face by this provider and chart reviewed on 12/02/22.  Per chart review patient has a past psychiatric history of schizoaffective disorder and cocaine use disorder.  He has medical history that includes CVA in 2022, hyperlipidemia, COPD, syphilis and HIV.  Patient reports he did takes medications for his schizoaffective disorder.  He has his medications present and bottles are active.  He is prescribed hydrocortisone cream 1% twice daily x 14 days which was started on 5/2 3 for affected area on nose.  Aspirin 81 mg daily, Zyprexa 10 mg nightly, pantoprazole 20 mg daily, Anoro Ellipta 1 puff daily, Ventolin inhaler 2 puffs twice daily as needed for shortness of breath.  He currently lives with his elderly mother.  He is currently not employed.  He has a court date for June 5 and June 6, unsure what these charges are related to.  Patient is vague when answering questions.  Per chart review patient has a history of multiple inpatient admissions with his last admission being at [facility] on 10/2022.  On evaluation Charles Charles reports he has used substances since he was roughly 62 years of age.  He has used/smoked cocaine daily for years, roughly $40 worth per day.  He currently only endorses cocaine use.  He has never received any residential treatment.  Reports this past Saturday had been on a cocaine  binge.  Reports when he is on a binge he does not take his medications.  He began having auditory hallucinations of multiple voices that tell him to kill himself and to kill others.  Reports these voices have not improved and he is afraid that he is going to act on these voices.  He is unable to contract for safety.  He denies access to firearms/weapons.  During evaluation Charles Charles is observed sitting in the assessment room with his daughter by his side.  He is in no acute distress.  He is alert/oriented x 4, cooperative, and attentive.   His speech is clear, coherent, at a normal rate and tone.He appears calm but does report feeling anxious.  He endorses feelings of helplessness, guilt, worthlessness, fatigue decreased motivation, and decreased sleep.  He is tearful at times throughout the assessment.  He has a depressed affect.  He denies any visual hallucinations.  He endorses paranoia at times.  He does not appear to be responding to internal/external stimuli.   Patient answered question appropriately.    Discussed inpatient psychiatric admission with patient and he is agreeable.  He is interested in residential substance abuse treatment upon discharge.  Total Time spent with patient: 30 minutes  Musculoskeletal  Strength & Muscle Tone: within normal limits Gait & Station: normal Patient leans: N/A  Psychiatric Specialty Exam  Presentation General Appearance:  Appropriate for Environment; Casual  Eye Contact: Good  Speech: Clear and Coherent; Normal Rate  Speech Volume: Normal  Handedness: Right   Mood and Affect  Mood: Anxious; Depressed; Hopeless; Worthless  Affect: Congruent; Tearful   Thought Process  Thought Processes: Coherent  Descriptions of Associations:Intact  Orientation:Full (Time, Place and Person)  Thought Content:Logical    Hallucinations:Hallucinations: Auditory Description of Auditory Hallucinations: hears voices that tells  Ideas of  Reference:No data recorded Suicidal Thoughts:Suicidal Thoughts: Yes, Passive SI Passive Intent and/or Plan: Without Intent; Without Plan  Homicidal Thoughts:Homicidal Thoughts: Yes, Passive HI Passive Intent and/or Plan: Without Intent; Without Plan   Sensorium  Memory: Immediate Good; Recent Good; Remote Good  Judgment: Fair  Insight: Fair   Executive Functions  Concentration: Good  Attention Span: Good  Recall: Good  Fund of Knowledge: Good  Language: Good   Psychomotor Activity  Psychomotor Activity: Psychomotor Activity: Normal   Assets  Assets: Communication Skills; Desire for Improvement; Physical Health; Resilience; Social Support   Sleep  Sleep: Sleep: Fair Number of Hours of Sleep: 6   Nutritional Assessment (For OBS and FBC admissions only) Has the patient had a weight loss or gain of 10 pounds or more in the last 3 months?: No Has the patient had a decrease in food intake/or appetite?: No Does the patient have dental problems?: No Does the patient have eating habits or behaviors that may be indicators of an eating disorder including binging or inducing vomiting?: No Has the patient recently lost weight without trying?: 0 Has the patient been eating poorly because of a decreased appetite?: 0 Malnutrition Screening Tool Score: 0    Physical Exam Vitals and nursing note reviewed.  Constitutional:      General: He is not in acute distress.    Appearance: He is well-developed.  HENT:     Head: Normocephalic.  Eyes:     General:        Right eye: No discharge.        Left eye: No discharge.  Cardiovascular:     Rate and Rhythm: Normal rate.  Pulmonary:     Effort: Pulmonary effort is normal. No respiratory distress.  Abdominal:     Tenderness: There is no abdominal tenderness.  Musculoskeletal:        General: No swelling. Normal range of motion.     Cervical back: Normal range of motion.  Skin:    Coloration: Skin is not  jaundiced or pale.  Neurological:     Mental Status: He is alert.  Psychiatric:        Attention and Perception: Attention normal. He perceives auditory hallucinations.        Mood and Affect: Mood is anxious and depressed. Affect is tearful.        Speech: Speech normal.        Behavior: Behavior normal. Behavior is cooperative.        Thought Content: Thought content is paranoid. Thought content includes homicidal and suicidal ideation. Thought content does not include homicidal or suicidal plan.        Cognition and Memory: Cognition normal.        Judgment: Judgment is impulsive.    Review of Systems  Constitutional: Negative.   HENT: Negative.    Eyes: Negative.   Respiratory: Negative.    Cardiovascular: Negative.   Musculoskeletal: Negative.   Skin: Negative.   Neurological: Negative.   Psychiatric/Behavioral:  Positive for depression, hallucinations, substance abuse and suicidal ideas. The patient is nervous/anxious.     Blood pressure 118/76, pulse 82, temperature 98.2 F (36.8 C), resp. rate 18, SpO2 100 %. There is no height or weight on file to calculate BMI.  Past Psychiatric History: Schizoaffective disorder, cocaine use disorder.  Is the patient at risk to self? Yes  Has the patient been a risk to self in the past 6 months? Yes .    Has the patient been a risk to self within the distant past? Yes   Is the patient a risk to others? Yes   Has the patient been a risk to others in the past 6 months? Yes   Has the patient been a risk to others within the distant past? Yes     Past Medical History:  Diagnosis Date   GERD (gastroesophageal reflux disease)    Headache    Schizo-affective schizophrenia (HCC)    Family History: unknown   Social History:   Cocaine use daily Unemployed Lives with elderly mother  Last Labs:  No visits with results within 6 Month(s) from this visit.  Latest known visit with results is:  No results displayed because visit has  over 200 results.      Allergies: Penicillins and Sulfa antibiotics  Medications:  Facility Ordered Medications  Medication   acetaminophen (TYLENOL) tablet 650 mg   alum & mag hydroxide-simeth (MAALOX/MYLANTA) 200-200-20 MG/5ML suspension 30 mL   magnesium hydroxide (MILK OF MAGNESIA) suspension 30 mL   hydrOXYzine (ATARAX) tablet 25 mg   traZODone (DESYREL) tablet 50 mg   [START ON 12/03/2022] pantoprazole (PROTONIX) EC tablet 20 mg   OLANZapine (ZYPREXA) tablet 10 mg   [START ON 12/03/2022] umeclidinium-vilanterol (ANORO ELLIPTA) 62.5-25 MCG/ACT 1 puff   albuterol (VENTOLIN HFA) 108 (90 Base) MCG/ACT inhaler 2 puff   hydrocortisone cream 1 %   [START ON 12/03/2022] aspirin chewable tablet 81 mg   PTA Medications  Medication Sig   PLAVIX 75 MG tablet Take 75 mg by mouth daily.   ZETIA 10 MG tablet Take 10 mg by mouth daily.   omeprazole (PRILOSEC) 20 MG capsule Take 20 mg by mouth daily.      Medical Decision Making  Patient meets criteria for inpatient psychiatric admission.  He is suicidal and homicidal and cannot contract for safety.  [facility] notified.    Recommendations  Based on my evaluation the patient does not appear to have an emergency medical condition.  Patient meets criteria for inpatient psychiatric admission.  He is suicidal and homicidal and cannot contract for safety.  [facility] notified.  Restarted home medications: Hydrocortisone cream 1% twice daily x 14 days which was started on 5/2 3 for affected area on nose.  Aspirin 81 mg daily, Zyprexa 10 mg nightly, pantoprazole 20 mg daily, Anoro Ellipta 1 puff daily, Ventolin inhaler 2 puffs twice daily as needed for shortness of breath.   Unsure of what medication patient takes for HIV.  Will wait till pharmacy verifies medications  Lab Orders         CBC with Differential/Platelet         Comprehensive metabolic panel         Hemoglobin A1c         Magnesium         Ethanol         Lipid panel         TSH          POCT Urine Drug Screen - (I-Screen)      EKG  Ardis Hughs, NP 12/02/22  7:22 PM

## 2022-12-02 NOTE — ED Notes (Signed)
Patient A&Ox4. Patient denies SI/HI and AH. Patient reports VH "I hear mumbling". Patient denies any physical complaints when asked. No acute distress noted. Support and encouragement provided. Routine safety checks conducted according to facility protocol. Encouraged patient to notify staff if thoughts of harm toward self or others arise. Patient verbalize understanding and agreement. Will continue to monitor for safety.

## 2022-12-03 ENCOUNTER — Encounter (HOSPITAL_COMMUNITY): Payer: Self-pay | Admitting: Psychiatry

## 2022-12-03 ENCOUNTER — Other Ambulatory Visit: Payer: Self-pay

## 2022-12-03 ENCOUNTER — Inpatient Hospital Stay (HOSPITAL_COMMUNITY)
Admission: AD | Admit: 2022-12-03 | Discharge: 2022-12-08 | DRG: 897 | Disposition: A | Discharge status: Home / Self Care | Payer: No Typology Code available for payment source | Source: Intra-hospital | Attending: Psychiatry | Admitting: Psychiatry

## 2022-12-03 DIAGNOSIS — Z818 Family history of other mental and behavioral disorders: Secondary | ICD-10-CM | POA: Diagnosis not present

## 2022-12-03 DIAGNOSIS — E785 Hyperlipidemia, unspecified: Secondary | ICD-10-CM | POA: Diagnosis present

## 2022-12-03 DIAGNOSIS — D709 Neutropenia, unspecified: Secondary | ICD-10-CM

## 2022-12-03 DIAGNOSIS — R45851 Suicidal ideations: Secondary | ICD-10-CM | POA: Diagnosis present

## 2022-12-03 DIAGNOSIS — Z8619 Personal history of other infectious and parasitic diseases: Secondary | ICD-10-CM

## 2022-12-03 DIAGNOSIS — E119 Type 2 diabetes mellitus without complications: Secondary | ICD-10-CM | POA: Diagnosis present

## 2022-12-03 DIAGNOSIS — F1494 Cocaine use, unspecified with cocaine-induced mood disorder: Principal | ICD-10-CM | POA: Diagnosis present

## 2022-12-03 DIAGNOSIS — F32A Depression, unspecified: Secondary | ICD-10-CM | POA: Diagnosis present

## 2022-12-03 DIAGNOSIS — B20 Human immunodeficiency virus [HIV] disease: Secondary | ICD-10-CM | POA: Diagnosis present

## 2022-12-03 DIAGNOSIS — E039 Hypothyroidism, unspecified: Secondary | ICD-10-CM | POA: Diagnosis present

## 2022-12-03 DIAGNOSIS — G2581 Restless legs syndrome: Secondary | ICD-10-CM | POA: Diagnosis present

## 2022-12-03 DIAGNOSIS — R12 Heartburn: Secondary | ICD-10-CM | POA: Diagnosis present

## 2022-12-03 DIAGNOSIS — J449 Chronic obstructive pulmonary disease, unspecified: Secondary | ICD-10-CM | POA: Diagnosis present

## 2022-12-03 DIAGNOSIS — F1424 Cocaine dependence with cocaine-induced mood disorder: Secondary | ICD-10-CM | POA: Diagnosis present

## 2022-12-03 DIAGNOSIS — F17210 Nicotine dependence, cigarettes, uncomplicated: Secondary | ICD-10-CM | POA: Diagnosis present

## 2022-12-03 DIAGNOSIS — F1420 Cocaine dependence, uncomplicated: Secondary | ICD-10-CM | POA: Diagnosis present

## 2022-12-03 DIAGNOSIS — F17200 Nicotine dependence, unspecified, uncomplicated: Secondary | ICD-10-CM | POA: Diagnosis present

## 2022-12-03 DIAGNOSIS — J441 Chronic obstructive pulmonary disease with (acute) exacerbation: Secondary | ICD-10-CM

## 2022-12-03 HISTORY — DX: Chronic obstructive pulmonary disease with (acute) exacerbation: J44.1

## 2022-12-03 HISTORY — DX: Neutropenia, unspecified: D70.9

## 2022-12-03 HISTORY — DX: Personal history of other infectious and parasitic diseases: Z86.19

## 2022-12-03 HISTORY — DX: Chronic obstructive pulmonary disease, unspecified: J44.9

## 2022-12-03 MED ORDER — TRAZODONE HCL 50 MG PO TABS
50.0000 mg | ORAL_TABLET | Freq: Every evening | ORAL | Status: DC | PRN
  Administered 2022-12-03 – 2022-12-07 (×5): 50 mg via ORAL
  Filled 2022-12-03 (×5): qty 1

## 2022-12-03 MED ORDER — DORAVIRINE 100 MG PO TABS
100.0000 mg | ORAL_TABLET | Freq: Every day | ORAL | Status: DC
  Administered 2022-12-03 – 2022-12-08 (×6): 100 mg via ORAL
  Filled 2022-12-03 (×7): qty 1

## 2022-12-03 MED ORDER — TENOFOVIR DISOPROXIL FUMARATE 300 MG PO TABS
300.0000 mg | ORAL_TABLET | Freq: Every day | ORAL | Status: DC
  Administered 2022-12-03 – 2022-12-08 (×6): 300 mg via ORAL
  Filled 2022-12-03 (×8): qty 1

## 2022-12-03 MED ORDER — ACETAMINOPHEN 325 MG PO TABS
650.0000 mg | ORAL_TABLET | Freq: Four times a day (QID) | ORAL | Status: DC | PRN
  Administered 2022-12-05: 650 mg via ORAL
  Filled 2022-12-03: qty 2

## 2022-12-03 MED ORDER — ATORVASTATIN CALCIUM 80 MG PO TABS
80.0000 mg | ORAL_TABLET | Freq: Every day | ORAL | Status: DC
  Administered 2022-12-03 – 2022-12-06 (×4): 80 mg via ORAL
  Filled 2022-12-03: qty 1
  Filled 2022-12-03: qty 2
  Filled 2022-12-03 (×5): qty 1

## 2022-12-03 MED ORDER — HYDROXYZINE HCL 10 MG PO TABS
10.0000 mg | ORAL_TABLET | Freq: Three times a day (TID) | ORAL | Status: DC | PRN
  Administered 2022-12-05: 10 mg via ORAL
  Filled 2022-12-03 (×2): qty 1

## 2022-12-03 MED ORDER — NICOTINE 14 MG/24HR TD PT24
14.0000 mg | MEDICATED_PATCH | Freq: Every day | TRANSDERMAL | Status: DC
  Filled 2022-12-03 (×3): qty 1

## 2022-12-03 MED ORDER — UMECLIDINIUM-VILANTEROL 62.5-25 MCG/ACT IN AEPB
1.0000 | INHALATION_SPRAY | Freq: Every day | RESPIRATORY_TRACT | Status: DC
  Administered 2022-12-03 – 2022-12-08 (×6): 1 via RESPIRATORY_TRACT
  Filled 2022-12-03: qty 14

## 2022-12-03 MED ORDER — ALUM & MAG HYDROXIDE-SIMETH 200-200-20 MG/5ML PO SUSP
30.0000 mL | ORAL | Status: DC | PRN
  Administered 2022-12-05 – 2022-12-07 (×8): 30 mL via ORAL
  Filled 2022-12-03 (×8): qty 30

## 2022-12-03 MED ORDER — MAGNESIUM HYDROXIDE 400 MG/5ML PO SUSP: 30.0000 mL | Freq: Every day | ORAL | Status: DC | PRN

## 2022-12-03 MED ORDER — DORAVIRIN-LAMIVUDIN-TENOFOV DF 100-300-300 MG PO TABS: 1.0000 | ORAL_TABLET | Freq: Every day | ORAL | Status: DC

## 2022-12-03 MED ORDER — LAMIVUDINE 150 MG PO TABS
300.0000 mg | ORAL_TABLET | Freq: Every day | ORAL | Status: DC
  Administered 2022-12-03 – 2022-12-08 (×6): 300 mg via ORAL
  Filled 2022-12-03 (×8): qty 2

## 2022-12-03 MED ORDER — ALBUTEROL SULFATE HFA 108 (90 BASE) MCG/ACT IN AERS: 2.0000 | INHALATION_SPRAY | Freq: Four times a day (QID) | RESPIRATORY_TRACT | Status: DC | PRN

## 2022-12-03 MED ORDER — CLOPIDOGREL BISULFATE 75 MG PO TABS
75.0000 mg | ORAL_TABLET | Freq: Every day | ORAL | Status: DC
  Administered 2022-12-03 – 2022-12-08 (×6): 75 mg via ORAL
  Filled 2022-12-03 (×8): qty 1

## 2022-12-03 MED ORDER — FLUOXETINE HCL 20 MG PO CAPS
20.0000 mg | ORAL_CAPSULE | Freq: Every day | ORAL | Status: DC
  Administered 2022-12-03 – 2022-12-08 (×6): 20 mg via ORAL
  Filled 2022-12-03 (×7): qty 1

## 2022-12-03 MED ORDER — NICOTINE POLACRILEX 2 MG MT GUM
4.0000 mg | CHEWING_GUM | OROMUCOSAL | Status: DC
  Administered 2022-12-03 – 2022-12-04 (×2): 4 mg via ORAL
  Filled 2022-12-03 (×16): qty 2

## 2022-12-03 MED ORDER — HALOPERIDOL LACTATE 5 MG/ML IJ SOLN: 5.0000 mg | Freq: Three times a day (TID) | INTRAMUSCULAR | Status: DC | PRN

## 2022-12-03 MED ORDER — HALOPERIDOL 5 MG PO TABS
5.0000 mg | ORAL_TABLET | Freq: Three times a day (TID) | ORAL | Status: DC | PRN
  Administered 2022-12-05: 5 mg via ORAL
  Filled 2022-12-03: qty 1

## 2022-12-03 NOTE — Group Note (Signed)
Recreation Therapy Group Note   Group Topic:Animal Assisted Therapy   Group Date: 12/03/2022 Start Time: 0945 End Time: 1030 Facilitators: Kayler Rise-McCall, LRT,CTRS Location: [address] Dayroom   Animal-Assisted Activity (AAA) Program Checklist/Progress Notes Patient Eligibility Criteria Checklist & Daily Group note for Rec Tx Intervention  AAA/T Program Assumption of Risk Form signed by Patient/ or Parent Legal Guardian Yes  Patient understands his/her participation is voluntary Yes   Affect/Mood: N/A   Participation Level: Did not attend    Clinical Observations/Individualized Feedback:     Plan: Continue to engage patient in RT group sessions 2-3x/week.   Lashawnda Hancox-McCall, LRT,CTRS 12/03/2022 12:42 PM

## 2022-12-03 NOTE — Hospital Course (Addendum)
5/7-5/27[name] inpatient psych 5/31: last time used crack 6/2: pt called daughter saying he wanted to kill himself 6/3: admitted to [facility] 6/4: initial eval at [facility] Course:   During the patient's hospitalization, patient had extensive initial psychiatric evaluation, and follow-up psychiatric evaluations every day.  Psychiatric diagnoses provided upon initial assessment:  ***  Patient's psychiatric medications were adjusted on admission:  ***  During the hospitalization, other adjustments were made to the patient's psychiatric medication regimen:  ***   During the hospitalization lab/imaging obtained: ***  Patient's care was discussed during the interdisciplinary team meeting every day during the hospitalization.  Patient's side effects to prescribed psychiatric medications: mild diarrhea  Assessment  Gradually, patient started adjusting to milieu. The patient was evaluated each day by a clinical provider to ascertain response to treatment. Improvement was noted by the patient's report of decreasing symptoms, improved sleep and appetite, affect, medication tolerance, behavior, and participation in unit programming.  Patient was asked each day to complete a self inventory noting mood, mental status, pain, new symptoms, anxiety and concerns.    Symptoms were reported as significantly decreased or resolved completely by discharge.   On day of discharge, the patient reports that their mood is stable. The patient denied having suicidal thoughts for more than 48 hours prior to discharge.  Patient denies having homicidal thoughts.  Patient denies having auditory hallucinations.  Patient denies any visual hallucinations or other symptoms of psychosis. The patient was motivated to continue taking medication with a goal of continued improvement in mental health.   The patient reports their target psychiatric symptoms of psychosis, anxiety, depression responded well to the psychiatric  medications, and the patient reports overall benefit other psychiatric hospitalization. Supportive psychotherapy was provided to the patient. The patient also participated in regular group therapy while hospitalized. Coping skills, problem solving as well as relaxation therapies were also part of the unit programming.  Labs were reviewed with the patient, and abnormal results were discussed with the patient.  The patient is able to verbalize their individual safety plan to this provider.  Behavioral Events: NA - able to notice cues about anger to walk away and will ask for PRNs to avoid escalation  Restraints: NA  Groups: Attend most of groups, engaged - limited by diarrhea

## 2022-12-03 NOTE — [facility] Suicide Risk Assessment (Signed)
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 male with PMH of cocaine use d/o, stimulant induced psycosis and depression, cannabis use d/o, tobacco use d/o, past inpatient psych admission, remote suicide attempt, HIV, syphilis, CVAs, who presented to [facility] (12/02/2022) with daughter, then admitted to [facility] (12/02/2022) for active suicidal ideation with plan to run into traffic in the setting of cocaine use.  Home Rx: Albuterol, atorvastatin 80 mg, plavix 75 mg, delstrigo Jodi Marble, NP @ [facility]  Continued Clinical Symptoms:  Alcohol Use Disorder Identification Test Final Score (AUDIT): 2 The "Alcohol Use Disorders Identification Test", Guidelines for Use in Primary Care, Second Edition.  World Science writer [facility]). Score between 0-7:  no or low risk or alcohol related problems. Score between 8-15:  moderate risk of alcohol related problems. Score between 16-19:  high risk of alcohol related problems. Score 20 or above:  warrants further diagnostic evaluation for alcohol dependence and treatment.  CLINICAL FACTORS:  Depression:   Comorbid alcohol abuse/dependence Impulsivity Insomnia Alcohol/Substance  Abuse/Dependencies Unstable or Poor Therapeutic Relationship Previous Psychiatric Diagnoses and Treatments Medical Diagnoses and Treatments/Surgeries  Musculoskeletal: Strength & Muscle Tone: within normal limits Gait & Station: normal Patient leans: N/A   Presentation  General Appearance:Appropriate for Environment, Casual, Fairly Groomed Eye Contact:Good Speech:Clear and Coherent, Normal Rate Volume:Normal Handedness:Right  Mood and Affect  Mood:Depressed Affect:Appropriate, Congruent, Constricted  Thought Process  Thought Process:Coherent, Goal Directed, Linear Descriptions of Associations:Intact  Thought Content Suicidal Thoughts:Suicidal Thoughts: No Homicidal Thoughts:Homicidal Thoughts: No Hallucinations:Hallucinations: None Ideas of Reference:None Thought Content:Rumination  Sensorium  Memory:Immediate Good Judgment:Fair Insight:Fair  Executive Functions  Orientation:Full (Time, Place and Person) Language:Good Concentration:Good Attention:Good Recall:Good Fund of Knowledge:Good  Psychomotor Activity  Psychomotor Activity:Psychomotor Activity: Normal  Assets  Assets:Communication Skills, Desire for Improvement, Resilience, Social Support  Sleep  Quality:Fair   COGNITIVE FEATURES THAT CONTRIBUTE TO RISK:  Loss of executive function and Thought constriction (tunnel vision)    SUICIDE RISK:  Moderate:  Frequent suicidal ideation with limited intensity, and duration, some specificity in terms of plans, no associated intent, good self-control, limited dysphoria/symptomatology, some risk factors present, and identifiable protective factors, including available and accessible social support.   PLAN OF CARE:  Patient met requirement for inpatient psychiatric hospitalization and has been admitted.  See H&P & attending's attestation for detailed plan.  I certify that inpatient services furnished can reasonably be expected to improve the patient's  condition.  Total Time spent with patient:  See attending attestation  Signed: Princess Bruins, DO Psychiatry Resident, PGY-2 [facility] - [facility]  [address] [place], [place] [address] Ph: [phone] Fax: [phone] 12/03/2022, 3:28 PM

## 2022-12-03 NOTE — Progress Notes (Signed)
   12/03/22 0800  Psych Admission Type (Psych Patients Only)  Admission Status Voluntary  Psychosocial Assessment  Patient Complaints None  Eye Contact Brief  Facial Expression Flat  Affect Depressed  Speech Logical/coherent  Interaction Assertive  Motor Activity Slow  Appearance/Hygiene Unremarkable  Behavior Characteristics Cooperative;Calm;Guarded  Mood Depressed  Thought Process  Coherency WDL  Content WDL  Delusions None reported or observed  Perception WDL  Hallucination None reported or observed  Judgment Impaired  Confusion None  Danger to Self  Current suicidal ideation? Denies  Danger to Others  Danger to Others None reported or observed

## 2022-12-03 NOTE — Group Note (Signed)
Date:  12/03/2022 Time:  11:58 AM  Group Topic/Focus:  Goals Group:   The focus of this group is to help patients establish daily goals to achieve during treatment and discuss how the patient can incorporate goal setting into their daily lives to aide in recovery.    Participation Level:  Minimal  Participation Quality:  Drowsy  Affect:  Appropriate  Cognitive:  Appropriate  Insight: None  Engagement in Group:  Limited  Modes of Intervention:  Discussion  Additional Comments:     Reymundo Poll 12/03/2022, 11:58 AM

## 2022-12-03 NOTE — Progress Notes (Signed)
  ADMISSION DAR NOTE:   Pt presented from [facility]. under voluntary status. Alert and oriented by 3. Pt observed with depressed affect, logical speech and fair eye contact.  Reports worsening depression and Substance abuse of Cocaine stated " I spend $40 everyday on cocaine have been addicted since I was 24". Denies any other drugs, He drinks "1-2 beers once in a while" Denies any abuse endorses family history of Mental health his Mother has anxiety.  Patient stated that "I am tired of using Cocaine and hearing the voices in my head telling me to kill myself"  his goal is to go to a long term treatment program. Patient is not working receives disability and lives with his Mother.      Emotional support and availability offered to Patient as needed. Skin assessment done Patient has an old healed stub scar to his right upper chest. Belongings searched per protocol. Items deemed contraband secured in locker. Unit orientation and routine discussed, Care Plan reviewed as well and Patient verbalized understanding. Fluids and Food offered, tolerated well. Q15 minutes safety checks initiated without self harm gestures.
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
## 2022-12-03 NOTE — Tx Team (Signed)
Initial Treatment Plan 12/03/2022 2:35 AM [name] [id number]    PATIENT STRESSORS: Medication change or noncompliance   Substance abuse     PATIENT STRENGTHS: Ability for insight  Communication skills  General fund of knowledge  Motivation for treatment/growth  Supportive family/friends    PATIENT IDENTIFIED PROBLEMS: "Substance abuse"  "Medication non compliance"    "I hear voices telling me to kill myself"               DISCHARGE CRITERIA:  Improved stabilization in mood, thinking, and/or behavior Medical problems require only outpatient monitoring Motivation to continue treatment in a less acute level of care Reduction of life-threatening or endangering symptoms to within safe limits Verbal commitment to aftercare and medication compliance  PRELIMINARY DISCHARGE PLAN: Outpatient therapy Placement in alternative living arrangements  PATIENT/FAMILY INVOLVEMENT: This treatment plan has been presented to and reviewed with the patient, Charles Charles, and/or family member.  The patient and family have been given the opportunity to ask questions and make suggestions.  Margarita Rana, RN 12/03/2022, 2:35 AM

## 2022-12-03 NOTE — Group Note (Signed)
Date:  12/03/2022 Time:  2:18 PM  Group Topic/Focus:  Emotional Education:   The focus of this group is to discuss what feelings/emotions are, and how they are experienced.    Participation Level:  Did Not Attend  Participation Quality:    Affect:    Cognitive:    Insight:  Engagement in Group:    Modes of Intervention:    Additional Comments:    Mariyam Remington M Tekesha Almgren 12/03/2022, 2:18 PM  

## 2022-12-03 NOTE — BH Assessment (Addendum)
Comprehensive Clinical Assessment (CCA) Note  12/03/2022 Charles Charles [id number] Disposition: Pt was a voluntary walk in at [facility].  He was seen by Darcey Nora, TTS for triage.  This clinician completed this CCA.  Vernard Gambles, NP saw patient and completed the MSE.  Patient accepted to [facility] 400-2.  Attending will be Dr. Phineas Inches.     Chief Complaint: No chief complaint on file.  Visit Diagnosis: MDD recurrent severe w/ psychotic symptoms; Cocaine use d/o     CCA Screening, Triage and Referral (STR)  Patient Reported Information How did you hear about [place]? Family/Friend  What Is the Reason for Your Visit/Call Today? Charles Charles is a 62 year old male presenting to [facility] voluntarily with chief complaint of needing to get off crack and hearing voices telling him to hurt himself and others. PT reports he has been using crack since age 40. Pt is using about $40 worth of crack daily. Pt also reports that he need medications for HIV. Pt last crack use was three days ago. Daughter reports that pt called her on Sunday reporting that he wanted to kill himself because of the voices. Pt denies current SI, HI, AVH.  How Long Has This Been Causing You Problems? > than 6 months  What Do You Feel Would Help You the Most Today? Alcohol or Drug Use Treatment   Have You Recently Had Any Thoughts About Hurting Yourself? No  Are You Planning to Commit Suicide/Harm Yourself At This time? No   Flowsheet Row ED from 12/02/2022 in [facility] ED from 02/06/2021 in [facility] Emergency Department at [facility] ED from 01/18/2021 in [facility] Emergency Department at [facility]  C-SSRS RISK CATEGORY No Risk No Risk High Risk       Have you Recently Had Thoughts About Hurting Someone Karolee Ohs? No  Are You Planning to Harm Someone at This Time? No  Explanation: Pt denies intent to hrm anyone now.   Have You Used Any Alcohol or Drugs in the Past 24 Hours?  No  What Did You Use and How Much? Almost entire disability check   Do You Currently Have a Therapist/Psychiatrist? No  Name of Therapist/Psychiatrist:    Have You Been Recently Discharged From Any Office Practice or Programs? No  Explanation of Discharge From Practice/Program: Pt says he needs one.     CCA Screening Triage Referral Assessment Type of Contact: Face-to-Face  Telemedicine Service Delivery:   Is this Initial or Reassessment?   Date Telepsych consult ordered in [facility]:    Time Telepsych consult ordered in [facility]:    Location of Assessment: [facility] Assessment Services  Provider Location: [facility] Assessment Services   Collateral Involvement: Rodena Piety [phone] daughter   Does Patient Have a Court Appointed Legal Guardian? No  Legal Guardian Contact Information: No legal guardian  Copy of Legal Guardianship Form: -- (No legal guardian)  Legal Guardian Notified of Arrival: -- (No legal guardian)  Legal Guardian Notified of Pending Discharge: -- (No legal guardian)  If Minor and Not Living with Parent(s), Who has Custody? Pt is an adult.  Is CPS involved or ever been involved? Never  Is APS involved or ever been involved? Never   Patient Determined To Be At Risk for Harm To Self or Others Based on Review of Patient Reported Information or Presenting Complaint? No  Method: No Plan  Availability of Means: No access or NA  Intent: Vague intent or NA  Notification Required: No need or identified person  Additional Information for Danger to Others Potential: Active psychosis  Additional Comments for Danger to Others Potential: Pt heard voices Sunday night (06/02) telling him to harm himself and others.  Are There Guns or Other Weapons in Your Home? No  Types of Guns/Weapons: NONe  Are These Weapons Safely Secured?                            No  Who Could Verify You Are Able To Have These Secured: Pt has no access  Do You Have any Outstanding  Charges, Pending Court Dates, Parole/Probation? Supposed to have a court date of 06/06 but daughter is going to see if she can get it continued.  Contacted To Inform of Risk of Harm To Self or Others: Other: Comment (No current HI or SI.)    Does Patient Present under Involuntary Commitment? No    [place] of Residence: [place]   Patient Currently Receiving the Following Services: Not Receiving Services   Determination of Need: Urgent (48 hours)   Options For Referral: Inpatient Hospitalization     CCA Biopsychosocial Patient Reported Schizophrenia/Schizoaffective Diagnosis in Past: No   Strengths: Drawing, basketball   Mental Health Symptoms Depression:   Difficulty Concentrating; Increase/decrease in appetite; Sleep (too much or little); Worthlessness; Hopelessness   Duration of Depressive symptoms:  Duration of Depressive Symptoms: Greater than two weeks   Mania:   None   Anxiety:    Tension; Worrying (Has panic attacks at times.)   Psychosis:   None   Duration of Psychotic symptoms:    Trauma:   N/A   Obsessions:   N/A   Compulsions:   "Driven" to perform behaviors/acts (Using crack)   Inattention:   N/A   Hyperactivity/Impulsivity:   N/A   Oppositional/Defiant Behaviors:   N/A   Emotional Irregularity:   Chronic feelings of emptiness   Other Mood/Personality Symptoms:   Depression    Mental Status Exam Appearance and self-care  Stature:   Small   Weight:   Average weight   Clothing:   Casual   Grooming:   Normal   Cosmetic use:   None   Posture/gait:   Normal   Motor activity:   Not Remarkable   Sensorium  Attention:   Normal   Concentration:   Normal   Orientation:   Situation; Place; Person; Object   Recall/memory:   Defective in Short-term   Affect and Mood  Affect:   Anxious; Depressed   Mood:   Anxious   Relating  Eye contact:   Normal   Facial expression:   Anxious   Attitude toward  examiner:   Cooperative   Thought and Language  Speech flow:  Clear and Coherent   Thought content:   Appropriate to Mood and Circumstances   Preoccupation:   None   Hallucinations:   Auditory   Organization:   Patent examiner of Knowledge:   Average   Intelligence:   Average   Abstraction:   Functional   Judgement:   Impaired   Programmer, systems   Insight:   Fair; Gaps   Decision Making:   Impulsive   Social Functioning  Social Maturity:   Impulsive; Irresponsible   Social Judgement:   Heedless   Stress  Stressors:   Family conflict; Housing; Surveyor, quantity   Coping Ability:   Overwhelmed   Skill Deficits:  Decision making; Self-control   Supports:   Family     Religion: Religion/Spirituality What is Your Religious Affiliation?: Christian  Leisure/Recreation:    Exercise/Diet:     CCA Employment/Education Employment/Work Situation:    Education:     CCA Family/Childhood History Family and Relationship History:    Childhood History:          CCA Substance Use Alcohol/Drug Use:                           ASAM's:  Six Dimensions of Multidimensional Assessment  Dimension 1:  Acute Intoxication and/or Withdrawal Potential:      Dimension 2:  Biomedical Conditions and Complications:      Dimension 3:  Emotional, Behavioral, or Cognitive Conditions and Complications:     Dimension 4:  Readiness to Change:     Dimension 5:  Relapse, Continued use, or Continued Problem Potential:     Dimension 6:  Recovery/Living Environment:     ASAM Severity Score:    ASAM Recommended Level of Treatment:     Substance use Disorder (SUD)    Recommendations for Services/Supports/Treatments:    Discharge Disposition:    DSM5 Diagnoses: Patient Active Problem List   Diagnosis Date Noted   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   Drug overdose 01/05/2022    Alcohol abuse 01/18/2021   Schizophrenia (HCC) 06/04/2016   Noncompliance 05/06/2016   Cannabis use disorder, moderate, dependence (HCC) 05/06/2016   Chest pain 04/14/2016   Abdominal pain, epigastric 04/14/2016   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   Alcohol use disorder, moderate, dependence (HCC) 04/10/2016   GERD (gastroesophageal reflux disease) 04/10/2016   Tobacco use disorder 04/10/2016   Suicidal ideation 04/09/2016   Stimulant abuse (HCC) 12/14/2014   Cocaine use disorder, moderate, dependence (HCC) 12/17/2013   Adult hypothyroidism 12/17/2013   Blurred vision 12/17/2013   Congenital hiatus hernia 10/01/2010     Referrals to Alternative Service(s): Referred to Alternative Service(s):   Place:   Date:   Time:    Referred to Alternative Service(s):   Place:   Date:   Time:    Referred to Alternative Service(s):   Place:   Date:   Time:    Referred to Alternative Service(s):   Place:   Date:   Time:     Wandra Mannan

## 2022-12-04 ENCOUNTER — Encounter (HOSPITAL_COMMUNITY): Payer: Self-pay

## 2022-12-04 LAB — T4, FREE: Free T4: 0.5 ng/dL — ABNORMAL LOW (ref 0.61–1.12)

## 2022-12-04 LAB — HEMOGLOBIN A1C
Hgb A1c MFr Bld: 6.4 % — ABNORMAL HIGH (ref 4.8–5.6)
Mean Plasma Glucose: 137 mg/dL

## 2022-12-04 LAB — FERRITIN: Ferritin: 56 ng/mL (ref 24–336)

## 2022-12-04 NOTE — Progress Notes (Signed)
   12/03/22 2200  Psych Admission Type (Psych Patients Only)  Admission Status Voluntary  Psychosocial Assessment  Eye Contact Poor  Facial Expression Flat  Affect Depressed;Flat  Speech Logical/coherent  Interaction Assertive  Motor Activity Slow  Appearance/Hygiene Unremarkable  Behavior Characteristics Cooperative  Mood Depressed  Thought Process  Coherency WDL  Content WDL  Delusions WDL  Perception Hallucinations  Hallucination Auditory  Judgment Impaired  Confusion None  Danger to Self  Current suicidal ideation? Denies  Agreement Not to Harm Self Yes  Description of Agreement verbal  Danger to Others  Danger to Others None reported or observed

## 2022-12-04 NOTE — Progress Notes (Signed)
   12/04/22 1530  Psych Admission Type (Psych Patients Only)  Admission Status Voluntary  Psychosocial Assessment  Patient Complaints None  Eye Contact Brief  Facial Expression Flat  Affect Flat  Speech Logical/coherent  Interaction Assertive  Motor Activity Slow  Appearance/Hygiene Unremarkable  Behavior Characteristics Cooperative  Mood Depressed  Thought Process  Coherency WDL  Content WDL  Delusions None reported or observed  Perception Hallucinations  Hallucination Auditory  Judgment Poor  Confusion None  Danger to Self  Current suicidal ideation? Denies  Agreement Not to Harm Self Yes  Description of Agreement verbal  Danger to Others  Danger to Others None reported or observed   Pt reports hearing voices but states he does not understand what the voices say.

## 2022-12-04 NOTE — Progress Notes (Signed)
[facility] MD Progress Note  Date: 12/04/2022 9:08 AM Name: Charles Charles  DOB: Mar 26, 1961 MRN: [id number] Unit: [id number]  CC: active SI  REASON FOR ADMISSION   Charles Charles is a 62 y.o. male with PMH of cocaine use d/o, stimulant induced psycosis and depression, cannabis use d/o, tobacco use d/o, past inpatient psych admission, remote suicide attempt, HIV, syphilis, CVAs, who presented to [facility] (12/02/2022) with daughter, then admitted to [facility] (12/02/2022) for active suicidal ideation with plan to run into traffic in the setting of cocaine use.   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 Patient was pleasant and engaged during evaluation.  Denied side effects to starting home medications and starting prozac.   Mood:Depressed-improving Sleep:Fair Appetite: Good WU:[id number] Thoughts: No WG:[id number] Thoughts: No VHQ:[id number]: None  Review of Systems  Respiratory:  Negative for shortness of breath.   Cardiovascular:  Negative for chest pain.  Gastrointestinal:  Positive for diarrhea. Negative for nausea and vomiting.  Neurological:  Negative for dizziness and headaches.    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, cannabis use d/o Inpatient treatment: yes - [facility] 2017, [facility] 2022, [place] 2024 Suicide: yes, remotely - cutting wrists, medication OD Medication history:  Haldol dec + cogentin, zyprexa, seroquel Medication compliance: poor   Past Medical/Surgical History:  Medical Diagnoses:  has a past medical history of Alcohol abuse (01/18/2021), Alcohol use disorder, moderate, dependence (HCC) (04/10/2016), Cannabis use disorder, moderate, dependence (HCC) (05/06/2016), COPD (chronic obstructive pulmonary disease) (HCC) (12/03/2022), COPD exacerbation (HCC) (12/03/2022), Drug overdose (01/05/2022), GERD (gastroesophageal reflux disease), Headache, History of syphilis (12/03/2022), Neutropenia (HCC) (12/03/2022), Schizo-affective schizophrenia (HCC), and Suicidal ideation (04/09/2016).  Prior Hosp: multiple for CVAs PCP: [facility]  - in [place] Allergies: Penicillins and Sulfa antibiotics    Family Psychiatric History:  Psych Rx: mom with anxiety   Social History:  Housing: Lives with mom H/o of housing instability Finances: unemployed, disability check  Support: daughter Children: Daughter x2, son x1. Oldest daughter is deceased   Substance Use History: Alcohol:  reports current alcohol use of about 56.0 standard drinks of alcohol per week. 1-2 beers about once a month Nicotine:  reports that he has been smoking cigarettes. He has been smoking an average of 2 packs per day. He has never used smokeless tobacco. Marijuana: intermittently Stimulants: smokes $40 worth of cocaine daily - using cocaine since 62yo Opiates: Denied Sedative/hypnotics: Denied Hallucinogens: Denied H/O Detox / Rehab: Denied   Past Medical History:  Past Medical History:  Diagnosis Date   Alcohol abuse 01/18/2021   Alcohol use disorder, moderate, dependence  (HCC) 04/10/2016   Cannabis use disorder, moderate, dependence (  HCC) 05/06/2016   COPD (chronic obstructive pulmonary disease) (HCC) 12/03/2022   COPD exacerbation (HCC) 12/03/2022   Drug overdose 01/05/2022   GERD (gastroesophageal reflux disease)    Headache    History of syphilis 12/03/2022   Neutropenia (HCC) 12/03/2022   Schizo-affective schizophrenia (HCC)    Suicidal ideation 04/09/2016    Past Surgical History:  Procedure Laterality Date   COLONOSCOPY WITH PROPOFOL N/A 08/04/2015   Procedure: COLONOSCOPY WITH PROPOFOL;  Surgeon: Midge Minium, MD;  Location: [facility];  Service: Endoscopy;  Laterality: N/A;   INCISION AND DRAINAGE PERIRECTAL ABSCESS N/A 07/14/2015   Procedure: IRRIGATION AND DEBRIDEMENT PERIRECTAL ABSCESS;  Surgeon: Lattie Haw, MD;  Location: [facility];  Service: General;  Laterality: N/A;   INCISION AND DRAINAGE PERIRECTAL ABSCESS N/A 08/14/2015   Procedure: IRRIGATION AND DEBRIDEMENT PERIRECTAL ABSCESS;  Surgeon: Gladis Riffle, MD;  Location: [facility];  Service: General;  Laterality: N/A;   Family History:  Family History  Problem Relation Age of Onset   Anxiety disorder Mother    Diabetes Mother    Diabetes Father    Social History:  Social History   Substance and Sexual Activity  Alcohol Use Yes   Alcohol/week: 56.0 standard drinks of alcohol   Types: 56 Cans of beer per week   Comment: 2 x 40's daily - pt says no alcohol for several weeks     Social History   Substance and Sexual Activity  Drug Use Yes   Types: Cocaine, Marijuana   Comment: back in the days    Social History   Socioeconomic History   Marital status: Single    Spouse name: Not on file   Number of children: Not on file   Years of education: Not on file   Highest education level: Not on file  Occupational History   Not on file  Tobacco Use   Smoking status: Every Day    Packs/day: 2    Types: Cigarettes   Smokeless tobacco: Never   Tobacco comments:     (1-2 cigs/day)  Vaping Use   Vaping Use: Never used  Substance and Sexual Activity   Alcohol use: Yes    Alcohol/week: 56.0 standard drinks of alcohol    Types: 56 Cans of beer per week    Comment: 2 x 40's daily - pt says no alcohol for several weeks   Drug use: Yes    Types: Cocaine, Marijuana    Comment: back in the days   Sexual activity: Never  Other Topics Concern   Not on file  Social History Narrative   Not on file   Social Determinants of Health   Financial Resource Strain: Not on file  Food Insecurity: No Food Insecurity (12/03/2022)   Hunger Vital Sign    Worried About Running Out of Food in the Last Year: Never true    Ran Out of Food in the Last Year: Never true  Transportation Needs: No Transportation Needs (12/03/2022)   PRAPARE - Administrator, Civil Service (Medical): No    Lack of Transportation (Non-Medical): No  Physical Activity: Not on file  Stress: Not on file  Social Connections: Not on file   Additional Social History:                         Current Medications: Current Facility-Administered Medications  Medication Dose Route Frequency Provider Last Rate Last Admin   acetaminophen (TYLENOL) tablet 650 mg  650 mg Oral Q6H PRN Onuoha, Chinwendu V, NP       albuterol (VENTOLIN HFA) 108 (90 Base) MCG/ACT inhaler 2 puff  2 puff Inhalation Q6H PRN Princess Bruins, DO       alum & mag hydroxide-simeth (MAALOX/MYLANTA) 200-200-20 MG/5ML suspension 30 mL  30 mL Oral Q4H PRN Onuoha, Chinwendu V, NP       atorvastatin (LIPITOR) tablet 80 mg  80 mg Oral QHS Princess Bruins, DO   80 mg at 12/03/22 2100   clopidogrel (PLAVIX) tablet 75 mg  75 mg Oral Daily Princess Bruins, DO   75 mg at 12/04/22 0825   tenofovir (VIREAD) tablet 300 mg  300 mg Oral Daily Rex Kras, MD   300 mg at 12/04/22 0825   And   lamiVUDine (EPIVIR) tablet 300 mg  300 mg Oral Daily Rex Kras, MD   300 mg at 12/04/22 1610   And   doravirine (PIFELTRO) tablet  100 mg  100 mg Oral Daily Rex Kras, MD   100 mg at 12/04/22 0825   FLUoxetine (PROZAC) capsule 20 mg  20 mg Oral Daily Princess Bruins, DO   20 mg at 12/04/22 0825   haloperidol (HALDOL) tablet 5 mg  5 mg Oral TID PRN Onuoha, Chinwendu V, NP       Or   haloperidol lactate (HALDOL) injection 5 mg  5 mg Intramuscular TID PRN Onuoha, Chinwendu V, NP       hydrOXYzine (ATARAX) tablet 10 mg  10 mg Oral TID PRN Onuoha, Chinwendu V, NP       magnesium hydroxide (MILK OF MAGNESIA) suspension 30 mL  30 mL Oral Daily PRN Onuoha, Chinwendu V, NP       nicotine polacrilex (NICORETTE) gum 4 mg  4 mg Oral Q4H while awake Princess Bruins, DO   4 mg at 12/03/22 1350   traZODone (DESYREL) tablet 50 mg  50 mg Oral QHS PRN Onuoha, Chinwendu V, NP   50 mg at 12/03/22 2100   umeclidinium-vilanterol (ANORO ELLIPTA) 62.5-25 MCG/ACT 1 puff  1 puff Inhalation Daily Princess Bruins, DO   1 puff at 12/04/22 0825    OBJECTIVE  BP 116/74 (BP Location: Right Arm)   Pulse 82   Temp 98.2 F (36.8 C) (Oral)   Resp 18   SpO2 97%   Physical Exam Physical Exam Vitals and nursing note reviewed.  Constitutional:      General: He is not in acute distress.    Appearance: He is not ill-appearing, toxic-appearing or diaphoretic.  HENT:     Head: Normocephalic.  Pulmonary:     Effort: Pulmonary effort is normal. No respiratory distress.  Neurological:     Mental Status: He is alert.     Psychiatric Specialty Exam: Presentation  General Appearance:Appropriate for Environment, Casual, Fairly Groomed Eye Contact:Good Speech:Clear and Coherent, Normal Rate Volume:Normal Handedness:Right  Mood and Affect  Mood:Depressed Affect:Appropriate, Congruent, Constricted  Thought Process  Thought Process:Coherent, Goal Directed, Linear Descriptions of Associations:Intact  Thought Content Suicidal Thoughts:No Homicidal Thoughts:No Hallucinations:None Ideas of Reference:None Thought Content:Rumination  Sensorium   Memory:Immediate Good Judgment:Fair Insight:Fair  Executive Functions  Orientation:Full (Time, Place and Person) Language:Good Concentration:Good Attention:Good Recall:Good Fund of Knowledge:Good  Psychomotor Activity  Psychomotor Activity:Psychomotor Activity: Normal  Assets  Assets:Communication Skills, Desire for Improvement, Resilience, Social Support  Sleep  Quality:Fair  Documented sleep last 24 hours: 7.75   Lab Results:  Results for orders placed or performed during the hospital encounter of 12/03/22 (from the past  48 hour(s))  Ferritin     Status: None   Collection Time: 12/04/22  6:19 AM  Result Value Ref Range   Ferritin 56 24 - 336 ng/mL    Comment: Performed at [facility], [address] [place], [place] [address]    Blood Alcohol level:  Lab Results  Component Value Date   ETH <10 12/02/2022   ETH <10 01/05/2022    Metabolic Disorder Labs: Lab Results  Component Value Date   HGBA1C 6.4 (H) 12/02/2022   MPG 137 12/02/2022   MPG 117 06/05/2016   Lab Results  Component Value Date   PROLACTIN 61.5 (H) 06/05/2016   PROLACTIN 18.8 (H) 06/04/2016   Lab Results  Component Value Date   CHOL 167 12/02/2022   TRIG 144 12/02/2022   HDL 47 12/02/2022   CHOLHDL 3.6 12/02/2022   VLDL 29 12/02/2022   LDLCALC 91 12/02/2022   LDLCALC 96 06/05/2016   ASSESSMENT / PLAN  Diagnoses / Active Problems: Principal Problem:   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 Psychiatric Diagnoses and Treatment:   Cocaine-induced depressive d/o  cocaine use  d/o Currently presenting as depressed, psychosis has resolved after cocaine metabolizing out of his system. He's had suicidal ideation as well. Interestingly patient has not been on antidepressant recently. Starting patient on prozac per below, considered other SSRIs, however given patient's stimulant use d/o, patient may prefer a medication that is more stimulating side effects. Discontinued home antipsychotics given patient is no currently psychotic and his psychosis appears to be substance induce, which ultimately treatment is cessation.  Continued prozac 20 mg qAM DISCONTINUED home seroquel and zyprexa - no psychosis, only when using cocaine   Tobacco use d/o  Precontemplative. Smokes 1.5 PPD, declined medications for smoking cessation. NRTs Encouraged cessation    3. Medical Issues Being Addressed:  Elevated TSH R/O thyroid disease with FT4 Follow-up free T4   HLD  h/o CVA - home atorvastatin 80 mg qHS, plavix 75 mg daily HIV - formulary equivalent to home delstrigo  COPD - home anoro elipta Neutropenia - appears to be stable, recommend outpatient follow-up   4. Routine and other pertinent labs:   CMP showed albumin 3.1 and total protein 6.4, otherwise WNL CBC showed neutropenia at 1.4, otherwise WNL BAL <10 Lipid Panel: WNL (12/02/2022) TSH 5.379 (12/02/2022)  Free T4 ordered    5. Disposition Planning:  Tentative Date: 6/12 Barrier: depression and medication management Location: home to mom vs residential rehab  Treatment Plan Summary: I certify that inpatient services furnished can reasonably be expected to improve the patient's condition.   Daily contact with patient to assess and evaluate symptoms and progress in treatment and Medication management Daily contact with patient to assess and evaluate symptoms and progress in treatment Patient's case to be discussed in multi-disciplinary team meeting Observation Level: q15 minute checks  Vital signs: q12 hours Precautions:  suicide, elopement, and assault The risks/benefits/side-effects/alternatives to this medication were discussed in detail with the patient and time was given for questions. The patient consents to medication trial. The patient consents to medication trial. FDA black box  warnings, if present, were discussed. Metabolic profile and EKG monitoring obtained while on an atypical antipsychotic  Encouraged patient to participate in unit milieu and in scheduled group therapies  Short Term Goals: Ability to identify changes in lifestyle to reduce recurrence of condition will improve, Ability to verbalize feelings will improve, Ability to disclose and discuss suicidal ideas, Ability to demonstrate self-control will improve, Ability to identify and develop effective coping behaviors will improve, Ability to maintain clinical measurements within normal limits will improve, Compliance with prescribed medications will improve, and Ability to identify triggers associated with substance abuse/mental health issues will improve Long Term Goals: Improvement in symptoms so as ready for discharge Social work and case management to assist with discharge planning and identification of hospital follow-up needs prior to discharge Estimated LOS: 5-7 days Discharge Concerns: Need to establish a safety plan; Medication compliance and effectiveness Discharge Goals: Return home with outpatient referrals for mental health follow-up including medication management/psychotherapy  Total Time spent with patient:  Patient's case was discussed with Attending, see attestation for more information.   Signed: Princess Bruins, DO Psychiatry Resident, PGY-2 [facility] - [facility]  [address] [place], [place] [address] Ph: [phone] Fax: [phone]

## 2022-12-04 NOTE — [facility] Group Notes (Signed)
[facility] Group Notes:  (Nursing/MHT/Case Management/Adjunct)  Date:  12/04/2022  Time:  1:51 AM  Type of Therapy:   Wrap-up group  Participation Level:  Minimal  Participation Quality:  Appropriate  Affect:  Appropriate  Cognitive:  Appropriate  Insight:  Appropriate  Engagement in Group:  Engaged  Modes of Intervention:  Education  Summary of Progress/Problems: Pt states he had no goal. Day 3/10.  Charles Charles 12/04/2022, 1:51 AM

## 2022-12-04 NOTE — Group Note (Signed)
Recreation Therapy Group Note   Group Topic:Problem Solving  Group Date: 12/04/2022 Start Time: 0932 End Time: 1012 Facilitators: Maye Parkinson-McCall, LRT,CTRS Location: [address] Dayroom   Goal Area(s) Addresses:  Patient will effectively work with peer towards shared goal.  Patient will identify skills used to make activity successful.  Patient will share challenges and verbalize solution-driven approaches used. Patient will identify how skills used during activity can be used to reach post d/c goals.   Group Description:  [employer]. Patients were provided the following materials: 4 drinking straws, 5 rubber bands, 5 paper clips, 2 index cards and 2 drinking cups. Using the provided materials patients were asked to build a launching mechanism to launch a ping pong ball across the room, approximately 10 feet. Patients were divided into teams of 3-5. Instructions required all materials be incorporated into the device, functionality of items left to the peer group's discretion.   Affect/Mood: Appropriate   Participation Level: Minimal   Participation Quality: Independent   Behavior: Appropriate   Speech/Thought Process: Focused   Insight: Good   Judgement: Good   Modes of Intervention: STEM Activity   Patient Response to Interventions:  Attentive   Education Outcome:  Acknowledges education   Clinical Observations/Individualized Feedback: Pt was bright, observant and humorous during group session.      Plan: Continue to engage patient in RT group sessions 2-3x/week.   Braedyn Riggle-McCall, LRT,CTRS 12/04/2022 12:49 PM

## 2022-12-04 NOTE — [facility] Group Notes (Signed)
[facility] Group Notes:  (Nursing/MHT/Case Management/Adjunct)  Date:  12/04/2022  Time:  8:24 PM  Type of Therapy:   NA Group  Participation Level:  Active  Participation Quality:  Appropriate  Affect:  Appropriate  Cognitive:  Appropriate  Insight:  Appropriate  Engagement in Group:  Engaged  Modes of Intervention:  Education  Summary of Progress/Problems: Attended NA meeting.  Noah Delaine 12/04/2022, 8:24 PM

## 2022-12-04 NOTE — Group Note (Signed)
Date:  12/04/2022 Time:  11:10 AM  Group Topic/Focus:  Goals Group:   The focus of this group is to help patients establish daily goals to achieve during treatment and discuss how the patient can incorporate goal setting into their daily lives to aide in recovery. Orientation:   The focus of this group is to educate the patient on the purpose and policies of crisis stabilization and provide a format to answer questions about their admission.  The group details unit policies and expectations of patients while admitted.    Participation Level:  Active  Participation Quality:  Appropriate  Affect:  Appropriate  Cognitive:  Appropriate  Insight: Appropriate  Engagement in Group:  Engaged  Modes of Intervention:  Discussion  Additional Comments:    Keerat Denicola Lashawn Nalah Macioce 12/04/2022, 11:10 AM  

## 2022-12-04 NOTE — B[facility] Counselor (Signed)
[facility]/BMU LCSW Progress Note   12/04/2022    3:09 PM  [name]      Type of Note: Residential/Transitional Options  Patient was given a Insurance underwriter for both [place] and [employer] location. CSW reviewed each one and told patient that he will need to call them and then CSW will submit referral. Patient understood and said he will look through list.     Signed:   Jacob Moores, MSW, LCSWA 12/04/2022 3:09 PM

## 2022-12-04 NOTE — BH IP Treatment Plan (Signed)
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) Active Problems:   Cocaine use disorder, severe, dependence (HCC)   Tobacco use disorder   HIV disease (HCC)   History of syphilis   COPD (chronic obstructive pulmonary disease) (HCC)   Neutropenia (HCC)   Current Medications:  Current Facility-Administered Medications  Medication Dose Route Frequency Provider Last Rate Last Admin   acetaminophen (TYLENOL) tablet 650 mg  650 mg Oral Q6H PRN Onuoha, Chinwendu V, NP       albuterol (VENTOLIN HFA) 108 (90 Base) MCG/ACT inhaler 2 puff  2 puff Inhalation Q6H PRN Princess Bruins, DO       alum & mag hydroxide-simeth (MAALOX/MYLANTA) 200-200-20 MG/5ML suspension 30 mL  30 mL Oral Q4H PRN Onuoha, Chinwendu V, NP       atorvastatin (LIPITOR) tablet 80 mg  80 mg Oral QHS Princess Bruins, DO   80 mg at 12/03/22 2100   clopidogrel (PLAVIX) tablet 75 mg  75 mg Oral Daily Princess Bruins, DO   75 mg at 12/04/22 0825   tenofovir (VIREAD) tablet 300 mg  300 mg Oral Daily Rex Kras, MD   300 mg at 12/04/22 0825   And   lamiVUDine (EPIVIR) tablet 300 mg  300 mg Oral Daily Rex Kras, MD   300 mg at 12/04/22 8657   And   doravirine (PIFELTRO) tablet 100 mg  100 mg Oral Daily Rex Kras, MD   100 mg at 12/04/22 0825   FLUoxetine (PROZAC) capsule 20 mg  20 mg Oral Daily Princess Bruins, DO   20 mg at 12/04/22 0825   haloperidol (HALDOL) tablet 5 mg  5 mg Oral TID PRN Onuoha, Chinwendu V, NP       Or   haloperidol lactate (HALDOL) injection 5 mg  5 mg Intramuscular TID PRN Onuoha, Chinwendu V, NP       hydrOXYzine (ATARAX) tablet 10 mg  10 mg Oral TID PRN Onuoha, Chinwendu V, NP       magnesium hydroxide (MILK  OF MAGNESIA) suspension 30 mL  30 mL Oral Daily PRN Onuoha, Chinwendu V, NP       nicotine polacrilex (NICORETTE) gum 4 mg  4 mg Oral Q4H while awake Princess Bruins, DO   4 mg at 12/04/22 1006   traZODone (DESYREL) tablet 50 mg  50 mg Oral QHS PRN Onuoha, Chinwendu V, NP   50 mg at 12/03/22 2100   umeclidinium-vilanterol (ANORO ELLIPTA) 62.5-25 MCG/ACT 1 puff  1 puff Inhalation Daily Princess Bruins, DO   1 puff at 12/04/22 0825   PTA Medications: Medications Prior to Admission  Medication Sig Dispense Refill Last Dose   albuterol (VENTOLIN HFA) 108 (90 Base) MCG/ACT inhaler Inhale 2 puffs into the lungs every 6 (six) hours as needed for wheezing or shortness of breath.      ANORO ELLIPTA 62.5-25 MCG/ACT AEPB Inhale 1 puff into the lungs daily.      DELSTRIGO 100-300-300 MG TABS per tablet Take 1 tablet by mouth daily.      doxycycline (VIBRA-TABS) 100 MG tablet Take 100 mg by mouth 2 (two) times daily.      atorvastatin (LIPITOR) 80 MG tablet Take 80 mg by mouth at bedtime.      omeprazole (PRILOSEC) 20 MG capsule  Take 20 mg by mouth daily. (Patient not taking: Reported on 12/03/2022)   Not Taking   PLAVIX 75 MG tablet Take 75 mg by mouth daily.       Patient Stressors: Medication change or noncompliance   Substance abuse    Patient Strengths: Ability for insight  Forensic psychologist fund of knowledge  Motivation for treatment/growth  Supportive family/friends   Treatment Modalities: Medication Management, Group therapy, Case management,  1 to 1 session with clinician, Psychoeducation, Recreational therapy.   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  Therapeutic Interventions: 1 to 1 sessions, Unit Group sessions and Medication  administration.  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  Therapeutic Interventions: 1 to 1 sessions, Unit Group sessions and Medication administration.  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) Long Term Goal(s): Knowledge of disease and therapeutic regimen to maintain health will improve  Short Term Goals: Ability to remain free from injury will improve, Ability to verbalize frustration and anger appropriately will improve, Ability to demonstrate self-control, Ability to participate in decision making will improve, Ability to verbalize feelings will improve, Ability to disclose and discuss suicidal ideas, Ability to identify and develop effective coping behaviors will improve, and Compliance with prescribed medications will improve  Medication Management: RN will administer medications as ordered by provider, will assess and evaluate patient's response and provide education to patient for prescribed medication. RN will report any adverse and/or side effects to prescribing provider.  Therapeutic Interventions: 1 on 1 counseling sessions, Psychoeducation, Medication administration, Evaluate responses to treatment, Monitor vital signs and CBGs as ordered, Perform/monitor CIWA, COWS, AIMS and Fall Risk screenings as ordered, Perform wound care treatments as  ordered.  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  Short Term Goals: Engage patient in aftercare planning with referrals and resources, Increase social support, Increase ability to appropriately verbalize feelings, Increase emotional regulation, Facilitate acceptance of mental health diagnosis and concerns, Facilitate patient progression through stages of change regarding substance use diagnoses and concerns, Identify triggers associated with mental health/substance abuse issues, and Increase skills for wellness and recovery  Therapeutic Interventions: Assess for all discharge needs, 1 to 1 time with Social worker, Explore available resources and support systems, Assess for adequacy in community support network, Educate family and significant other(s) on suicide prevention, Complete Psychosocial Assessment, Interpersonal group therapy.  Evaluation of Outcomes: Not Progressing   Progress in Treatment: Attending groups: Yes. Participating in groups: Yes. Taking medication as prescribed: Yes. Toleration medication: Yes.  Family/Significant other contact made: No, will contact:  daughter Shanda Bumps  Patient understands diagnosis: Yes. Discussing patient identified problems/goals with staff: Yes. Medical problems stabilized or resolved: Yes. Denies suicidal/homicidal ideation: Yes. Issues/concerns per patient self-inventory: No.  New problem(s) identified: No, Describe:  None reported   New Short Term/Long Term Goal(s):detox, medication management for mood stabilization; elimination of SI thoughts; development of comprehensive mental wellness/sobriety plan  Patient Goals:  " Go to a residential and stop hearing voices "    Discharge Plan or Barriers: Patient recently admitted. CSW will continue to follow and assess for appropriate referrals and possible discharge planning.    Reason for Continuation of Hospitalization: Anxiety Depression Hallucinations Medication stabilization Suicidal ideation Withdrawal symptoms  Estimated Length of Stay: 3-5 days  Last 3 [place] Suicide Severity Risk Score: Flowsheet Row Ad[facility] (Current) [facility] from 12/02/2022 in [facility] ED from 02/06/2021 in [facility] Department at [facility]  C-SSRS RISK CATEGORY Moderate Risk No Risk No Risk       Last PHQ 2/9 Scores:     No data to display          Scribe for Treatment Team: Beather Arbour 12/04/2022 1:55 PM

## 2022-12-04 NOTE — Progress Notes (Signed)
   12/04/22 2000  Psych Admission Type (Psych Patients Only)  Admission Status Voluntary  Psychosocial Assessment  Patient Complaints Substance abuse;Depression  Eye Contact Poor  Facial Expression Flat  Affect Depressed;Flat  Speech Logical/coherent  Interaction Assertive  Motor Activity Slow  Appearance/Hygiene Unremarkable  Behavior Characteristics Cooperative  Mood Depressed  Thought Process  Coherency WDL  Content WDL  Delusions WDL  Perception Hallucinations  Hallucination Auditory  Judgment Impaired  Confusion None  Danger to Self  Current suicidal ideation? Denies  Agreement Not to Harm Self Yes  Description of Agreement verbal  Danger to Others  Danger to Others None reported or observed

## 2022-12-05 LAB — GLUCOSE, CAPILLARY
Glucose-Capillary: 131 mg/dL — ABNORMAL HIGH (ref 70–99)
Glucose-Capillary: 140 mg/dL — ABNORMAL HIGH (ref 70–99)

## 2022-12-05 MED ORDER — ENSURE ENLIVE PO LIQD
237.0000 mL | Freq: Two times a day (BID) | ORAL | Status: DC
  Administered 2022-12-05 – 2022-12-07 (×3): 237 mL via ORAL
  Filled 2022-12-05 (×8): qty 237

## 2022-12-05 MED ORDER — POLYETHYLENE GLYCOL 3350 17 G PO PACK: 17.0000 g | PACK | Freq: Every day | ORAL | Status: DC | PRN

## 2022-12-05 MED ORDER — TAB-A-VITE/IRON PO TABS
1.0000 | ORAL_TABLET | Freq: Every day | ORAL | Status: DC
  Administered 2022-12-05 – 2022-12-08 (×4): 1 via ORAL
  Filled 2022-12-05 (×6): qty 1

## 2022-12-05 MED ORDER — LOPERAMIDE HCL 2 MG PO CAPS: 4.0000 mg | ORAL_CAPSULE | Freq: Four times a day (QID) | ORAL | Status: DC | PRN

## 2022-12-05 MED ORDER — NICOTINE POLACRILEX 2 MG MT GUM: 4.0000 mg | CHEWING_GUM | OROMUCOSAL | Status: DC | PRN

## 2022-12-05 MED ORDER — LEVOTHYROXINE SODIUM 50 MCG PO TABS
50.0000 ug | ORAL_TABLET | Freq: Every day | ORAL | Status: DC
  Administered 2022-12-06 – 2022-12-08 (×3): 50 ug via ORAL
  Filled 2022-12-05: qty 2
  Filled 2022-12-05 (×4): qty 1

## 2022-12-05 NOTE — [facility] Group Notes (Signed)
[facility] Group Notes:  (Nursing/MHT/Case Management/Adjunct)  Date:  12/05/2022  Time:  9:21 PM  Type of Therapy:   Wrap-up group  Participation Level:  Did Not Attend  Participation Quality:    Affect:    Cognitive:    Insight:    Engagement in Group:    Modes of Intervention:    Summary of Progress/Problems: Pt refused to attend group.  Noah Delaine 12/05/2022, 9:21 PM

## 2022-12-05 NOTE — Progress Notes (Signed)
Nursing Note: 0700-1900   Goal for today: " Try to get to a drug rehab program."  Pt reports that he slept well last night, appetite is good and is tolerating prescribed medication without side effects.  Rates that anxiety is  0/10, depression and hopelessness are 7/10 this am. Pt got upset with male in dayroom and requested "something for voices." "She is up in everybody's business, I can't stand that!"  Pt reports that he hears voices only when he is mad, "It is my own voice, in my past."  Medication and pacing the hallway was helpful in decreasing irritability. Pt denies thoughts to harm self.   Pt. encouraged to verbalize needs and concerns, active listening and support provided.  Continued Q 15 minute safety checks.  Observed active participation in group settings.   12/05/22 0800  Psych Admission Type (Psych Patients Only)  Admission Status Voluntary  Psychosocial Assessment  Patient Complaints None  Eye Contact Fair  Facial Expression Flat  Affect Depressed  Speech Logical/coherent  Interaction Assertive  Motor Activity Slow  Appearance/Hygiene Unremarkable  Behavior Characteristics Cooperative  Mood Pleasant  Thought Process  Coherency WDL  Content WDL  Delusions None reported or observed  Perception WDL  Hallucination Auditory  Judgment Impaired  Confusion None  Danger to Self  Current suicidal ideation? Denies  Agreement Not to Harm Self Yes  Description of Agreement Verbal  Danger to Others  Danger to Others None reported or observed

## 2022-12-05 NOTE — Plan of Care (Signed)
Charles Charles (daughter) @ [phone] (Mobile) - unavailable, left a HIPAA compliant voicemail, will try again

## 2022-12-05 NOTE — B[facility] Counselor (Signed)
Adult Comprehensive Assessment  Patient ID: Charles Charles, male   DOB: [birth year], 62 y.o.   MRN: [id number]  Information Source: Information source: Patient  Current Stressors:  Patient states their primary concerns and needs for treatment are:: " I been hearing voices and I wanted to kill myself " Patient states their goals for this hospitilization and ongoing recovery are:: " get the voices to stop and seek a residential to help with my cocaine use " Educational / Learning stressors: None reported Employment / Job issues: None reported Family Relationships: " my mom gets under my skin a lot and tell me to clean " Financial / Lack of resources (include bankruptcy): None reported Housing / Lack of housing: None reported 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 Social relationships: None reported Substance abuse: " using crack " Bereavement / Loss: " I loss my sister and she should be here , loss my oldest daughter to an accident, and my dad "  Living/Environment/Situation:  Living Arrangements: Parent Living conditions (as described by patient or guardian): Home Who else lives in the home?: Mom How long has patient lived in current situation?: " for some time " What is atmosphere in current home: Comfortable, Chaotic  Family History:  Marital status: Single Are you sexually active?: Yes What is your sexual orientation?: Straight  Has your sexual activity been affected by drugs, alcohol, medication, or emotional stress?: N/A Does patient have children?: Yes How many children?: 8 How is patient's relationship with their children?: 6 girls, two boys - does not see children very often  Childhood History:  By whom was/is the patient raised?: Both parents Description of patient's relationship with caregiver when they were a child: " so so " Patient's description of current relationship with people who raised him/her: " My father is  deceased and my mom she gets under my skin " How were you disciplined when you got in trouble as a child/adolescent?: Whoopings  Does patient have siblings?: Yes Number of Siblings: 1 Description of patient's current relationship with siblings: Pt states that he is not close with his brother Did patient suffer any verbal/emotional/physical/sexual abuse as a child?: Yes Did patient suffer from severe childhood neglect?: No Has patient ever been sexually abused/assaulted/raped as an adolescent or adult?: No Was the patient ever a victim of a crime or a disaster?: No Witnessed domestic violence?: No Has patient been affected by domestic violence as an adult?: No  Education:  Highest grade of school patient has completed: 9th Currently a student?: No Learning disability?: Yes What learning problems does patient have?: " reading and writing "  Employment/Work Situation:   Employment Situation: On disability Why is Patient on Disability: Mental health  How Long has Patient Been on Disability: Since 1995 Patient's Job has Been Impacted by Current Illness: No What is the Longest Time Patient has Held a Job?: 2 years Where was the Patient Employed at that Time?: [employer] Has Patient ever Been in the [employer]?: No  Financial Resources:   Surveyor, quantity resources: Insurance claims handler, [employer], Medicaid Does patient have a Lawyer or guardian?: No  Alcohol/Substance Abuse:   What has been your use of drugs/alcohol within the last 12 months?: " crack and cigarettes " If attempted suicide, did drugs/alcohol play a role in this?: Yes Alcohol/Substance Abuse Treatment Hx: Denies past history Has alcohol/substance abuse ever caused legal problems?: No  Social Support System:   Conservation officer, nature Support System: Good Describe Community  Support System: Daughter and mom Type of faith/religion: [employer]ri[employer] Knuckles How does patient's faith help to cope with current illness?:  Church  Leisure/Recreation:   Do You Have Hobbies?: Yes Leisure and Hobbies: USe to love playing basketball - complains of poor eyesight  Strengths/Needs:   What is the patient's perception of their strengths?: "Nothing really" Patient states they can use these personal strengths during their treatment to contribute to their recovery: "Nothing really " Patient states these barriers may affect/interfere with their treatment: Noen reported Patient states these barriers may affect their return to the community: None reported Other important information patient would like considered in planning for their treatment: N/A  Discharge Plan:   Currently receiving community mental health services: Yes (From Whom) ([facility]) Patient states concerns and preferences for aftercare planning are: Pt wants to go to a residential Patient states they will know when they are safe and ready for discharge when: Once the voices stop Does patient have access to transportation?: Yes Does patient have financial barriers related to discharge medications?: No Will patient be returning to same living situation after discharge?: Yes  Summary/Recommendations:   Summary and Recommendations (to be completed by the evaluator): Charles Charles is a 62 y/o [place] American male who was admitted to [facility] for Auditory Hallucination and wanting to kill himself. 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 Charles Charles states that his main stressors are family, physical health, and bereavement. Patient denies any abuse as a child/adult , states that he use cocaine daily and smokes cigarettes , and desires to go to a residential. Charles Charles is connected with [facility] for his Mental Health services and states that he missed his first appointment with his doctor, but willing to go back. Patient may need transportation once  released if daughter or mom cannot pick him up.While here, Charles Charles can benefit from crisis stabilization, medication management, therapeutic milieu, and referrals for services.   Isabella Bowens. 12/05/2022

## 2022-12-05 NOTE — Progress Notes (Signed)
[facility] MD Progress Note  Date: 12/05/2022 12:49 PM Name: Charles Charles  DOB: 1961/01/31 MRN: [id number] Unit: [id number]  CC: active SI  REASON FOR ADMISSION   Charles Charles is a 62 y.o. male with PMH of cocaine use d/o, stimulant induced psycosis and depression, cannabis use d/o, tobacco use d/o, past inpatient psych admission, remote suicide attempt, HIV, syphilis, CVAs, who presented to [facility] (12/02/2022) with daughter, then admitted to [facility] (12/02/2022) for active suicidal ideation with plan to run into traffic in the setting of cocaine use.   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 Patient was pleasant and engaged during evaluation.  Reported worsening diarrhea after starting Prozac.   Mood:Depressed, Anxious-improving Sleep:Fair Appetite: Good -but does not like the food here, requested Ensure QM:VHQIONGE Thoughts: No (Denied active and passive SI hide) XB:[id number] Thoughts: No NUU:[id number]: None  Review of Systems  Respiratory:  Negative for shortness of breath.   Cardiovascular:  Negative for chest pain.  Gastrointestinal:  Positive for diarrhea. Negative for nausea and vomiting.  Neurological:  Negative for  dizziness and headaches.    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, cannabis use d/o Inpatient treatment: yes - [facility] 2017, [facility] 2022, [place] 2024 Suicide: yes, remotely - cutting wrists, medication OD Medication history:  Haldol dec + cogentin, zyprexa, seroquel Medication compliance: poor   Past Medical/Surgical History:  Medical Diagnoses:  has a past medical history of Alcohol abuse (01/18/2021), Alcohol use disorder, moderate, dependence (HCC) (04/10/2016), Cannabis use disorder, moderate, dependence (HCC) (05/06/2016), COPD (chronic obstructive pulmonary disease) (HCC) (12/03/2022), COPD exacerbation (HCC) (12/03/2022), Drug overdose (01/05/2022), GERD (gastroesophageal reflux disease), Headache, History of syphilis (12/03/2022), Neutropenia (HCC) (12/03/2022), Schizo-affective schizophrenia (HCC), and Suicidal ideation (04/09/2016).  Prior Hosp: multiple for CVAs PCP: [facility]  - in [place] Allergies: Penicillins and Sulfa antibiotics    Family Psychiatric History:  Psych Rx: mom with anxiety   Social History:  Housing: Lives with mom H/o of housing instability Finances: unemployed, disability check  Support: daughter Children: Daughter x2, son x1. Oldest daughter is deceased   Substance Use History: Alcohol:  reports current alcohol use of about 56.0 standard drinks of alcohol per week. 1-2 beers about once a month Nicotine:  reports that he has been smoking cigarettes. He has been smoking an average of 2 packs per day. He has never used smokeless tobacco. Marijuana: intermittently Stimulants: smokes $40 worth of cocaine daily - using cocaine since 62yo Opiates: Denied Sedative/hypnotics: Denied Hallucinogens: Denied H/O Detox / Rehab: Denied   Past Medical History:  Past Medical History:  Diagnosis Date    Alcohol abuse 01/18/2021   Alcohol use  disorder, moderate, dependence (HCC) 04/10/2016   Cannabis use disorder, moderate, dependence (HCC) 05/06/2016   COPD (chronic obstructive pulmonary disease) (HCC) 12/03/2022   COPD exacerbation (HCC) 12/03/2022   Drug overdose 01/05/2022   GERD (gastroesophageal reflux disease)    Headache    History of syphilis 12/03/2022   Neutropenia (HCC) 12/03/2022   Schizo-affective schizophrenia (HCC)    Suicidal ideation 04/09/2016    Past Surgical History:  Procedure Laterality Date   COLONOSCOPY WITH PROPOFOL N/A 08/04/2015   Procedure: COLONOSCOPY WITH PROPOFOL;  Surgeon: Midge Minium, MD;  Location[facility];  Service: Endoscopy;  Laterality: N/A;   INCISION AND DRAINAGE PERIRECTAL ABSCESS N/A 07/14/2015   Procedure: IRRIGATION AND DEBRIDEMENT PERIRECTAL ABSCESS;  Surgeon: Lattie Haw, MD;  Location: [facility];  Service: General;  Laterality: N/A;   INCISION AND DRAINAGE PERIRECTAL ABSCESS N/A 08/14/2015   Procedure: IRRIGATION AND DEBRIDEMENT PERIRECTAL ABSCESS;  Surgeon: Gladis Riffle, MD;  Location: [facility];  Service: General;  Laterality: N/A;   Family History:  Family History  Problem Relation Age of Onset   Anxiety disorder Mother    Diabetes Mother    Diabetes Father    Social History:  Social History   Substance and Sexual Activity  Alcohol Use Yes   Alcohol/week: 56.0 standard drinks of alcohol   Types: 56 Cans of beer per week   Comment: 2 x 40's daily - pt says no alcohol for several weeks     Social History   Substance and Sexual Activity  Drug Use Yes   Types: Cocaine, Marijuana   Comment: back in the days    Social History   Socioeconomic History   Marital status: Single    Spouse name: Not on file   Number of children: Not on file   Years of education: Not on file   Highest education level: Not on file  Occupational History   Not on file  Tobacco Use   Smoking status: Every Day    Packs/day: 2     Types: Cigarettes   Smokeless tobacco: Never   Tobacco comments:    (1-2 cigs/day)  Vaping Use   Vaping Use: Never used  Substance and Sexual Activity   Alcohol use: Yes    Alcohol/week: 56.0 standard drinks of alcohol    Types: 56 Cans of beer per week    Comment: 2 x 40's daily - pt says no alcohol for several weeks   Drug use: Yes    Types: Cocaine, Marijuana    Comment: back in the days   Sexual activity: Never  Other Topics Concern   Not on file  Social History Narrative   Not on file   Social Determinants of Health   Financial Resource Strain: Not on file  Food Insecurity: No Food Insecurity (12/03/2022)   Hunger Vital Sign    Worried About Running Out of Food in the Last Year: Never true    Ran Out of Food in the Last Year: Never true  Transportation Needs: No Transportation Needs (12/03/2022)   PRAPARE - Administrator, Civil Service (Medical): No    Lack of Transportation (Non-Medical): No  Physical Activity: Not on file  Stress: Not on file  Social Connections: Not on file   Additional Social History:                         Current Medications: Current Facility-Administered Medications  Medication Dose Route Frequency  Provider Last Rate Last Admin   acetaminophen (TYLENOL) tablet 650 mg  650 mg Oral Q6H PRN Onuoha, Chinwendu V, NP   650 mg at 12/05/22 0737   albuterol (VENTOLIN HFA) 108 (90 Base) MCG/ACT inhaler 2 puff  2 puff Inhalation Q6H PRN Princess Bruins, DO       alum & mag hydroxide-simeth (MAALOX/MYLANTA) 200-200-20 MG/5ML suspension 30 mL  30 mL Oral Q4H PRN Onuoha, Chinwendu V, NP       atorvastatin (LIPITOR) tablet 80 mg  80 mg Oral QHS Princess Bruins, DO   80 mg at 12/04/22 2137   clopidogrel (PLAVIX) tablet 75 mg  75 mg Oral Daily Princess Bruins, DO   75 mg at 12/05/22 4098   tenofovir (VIREAD) tablet 300 mg  300 mg Oral Daily Rex Kras, MD   300 mg at 12/05/22 1191   And   lamiVUDine (EPIVIR) tablet 300 mg  300 mg Oral  Daily Rex Kras, MD   300 mg at 12/05/22 4782   And   doravirine (PIFELTRO) tablet 100 mg  100 mg Oral Daily Rex Kras, MD   100 mg at 12/05/22 0737   feeding supplement (ENSURE ENLIVE / ENSURE PLUS) liquid 237 mL  237 mL Oral BID BM Princess Bruins, DO       FLUoxetine (PROZAC) capsule 20 mg  20 mg Oral Daily Princess Bruins, DO   20 mg at 12/05/22 0737   haloperidol (HALDOL) tablet 5 mg  5 mg Oral TID PRN Onuoha, Chinwendu V, NP   5 mg at 12/05/22 1053   Or   haloperidol lactate (HALDOL) injection 5 mg  5 mg Intramuscular TID PRN Onuoha, Chinwendu V, NP       hydrOXYzine (ATARAX) tablet 10 mg  10 mg Oral TID PRN Onuoha, Chinwendu V, NP   10 mg at 12/05/22 1053   [START ON 12/06/2022] levothyroxine (SYNTHROID) tablet 50 mcg  50 mcg Oral Q0600 Princess Bruins, DO       loperamide (IMODIUM) capsule 4 mg  4 mg Oral QID PRN Princess Bruins, DO       multivitamins with iron tablet 1 tablet  1 tablet Oral Daily Princess Bruins, DO       nicotine polacrilex (NICORETTE) gum 4 mg  4 mg Oral PRN Princess Bruins, DO       polyethylene glycol (MIRALAX / GLYCOLAX) packet 17 g  17 g Oral Daily PRN Princess Bruins, DO       traZODone (DESYREL) tablet 50 mg  50 mg Oral QHS PRN Onuoha, Chinwendu V, NP   50 mg at 12/04/22 2137   umeclidinium-vilanterol (ANORO ELLIPTA) 62.5-25 MCG/ACT 1 puff  1 puff Inhalation Daily Princess Bruins, DO   1 puff at 12/05/22 0737    OBJECTIVE  BP 110/64 (BP Location: Left Arm)   Pulse 82   Temp 98 F (36.7 C) (Oral)   Resp 18   SpO2 98%   Physical Exam Physical Exam Vitals and nursing note reviewed.  Constitutional:      General: He is not in acute distress.    Appearance: He is not ill-appearing, toxic-appearing or diaphoretic.  HENT:     Head: Normocephalic.  Pulmonary:     Effort: Pulmonary effort is normal. No respiratory distress.  Neurological:     Mental Status: He is alert.     Psychiatric Specialty Exam: Presentation  General Appearance:Appropriate for  Environment, Casual, Fairly Groomed Eye Contact:Good Speech:Clear and Coherent, Normal Rate Volume:Normal Handedness:Right  Mood and Affect  Mood:Depressed, Anxious Affect:Appropriate, Congruent, Depressed, Full Range  Thought Process  Thought Process:Coherent, Goal Directed, Linear Descriptions of Associations:Intact  Thought Content Suicidal Thoughts:No (Denied active and passive SI hide) Homicidal Thoughts:No Hallucinations:None Ideas of Reference:None Thought Content:Rumination  Sensorium  Memory:Immediate Good Judgment:Fair Insight:Fair  Executive Functions  Orientation:Full (Time, Place and Person) Language:Good Concentration:Good Attention:Good Recall:Good Fund of Knowledge:Fair  Psychomotor Activity  Psychomotor Activity:Psychomotor Activity: Normal  Assets  Assets:Communication Skills, Desire for Improvement, Resilience, Social Support  Sleep  Quality:Fair  Documented sleep last 24 hours: 9.5   Lab Results:  Results for orders placed or performed during the hospital encounter of 12/03/22 (from the past 48 hour(s))  Ferritin     Status: None   Collection Time: 12/04/22  6:19 AM  Result Value Ref Range   Ferritin 56 24 - 336 ng/mL    Comment: Performed at [facility], [address], [place], [place] [address]  T4, free     Status: Abnormal   Collection Time: 12/04/22  6:19 AM  Result Value Ref Range   Free T4 0.50 (L) 0.61 - 1.12 ng/dL    Comment: (NOTE) Biotin ingestion may interfere with free T4 tests. If the results are inconsistent with the TSH level, previous test results, or the clinical presentation, then consider biotin interference. If needed, order repeat testing after stopping biotin. Performed at [facility], [address] [place], [place] [address]   Glucose, capillary     Status: Abnormal   Collection Time: 12/05/22 11:52 AM  Result Value Ref Range   Glucose-Capillary 131 (H) 70 - 99 mg/dL     Comment: Glucose reference range applies only to samples taken after fasting for at least 8 hours.    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 Psychiatric Diagnoses and Treatment:   Cocaine-induced depressive d/o  cocaine use d/o Currently presenting as depressed, psychosis has resolved after cocaine metabolizing out of his system. He's had suicidal ideation as well. Interestingly patient has not been on antidepressant recently. Starting patient on prozac per below, considered other SSRIs, however given patient's stimulant use d/o, patient may prefer a medication that is more stimulating side effects. Discontinued home antipsychotics given patient is no currently psychotic and his psychosis appears to be substance induce, which ultimately treatment is cessation.  FT4 resulted with hypothyroidism which is likely a large contributor to his mood, rx per  below. Continued prozac 20 mg qAM DISCONTINUED home seroquel  and zyprexa - no psychosis, only when using cocaine   Tobacco use d/o  Precontemplative. Smokes 1.5 PPD, declined medications for smoking cessation. NRTs Encouraged cessation    3. Medical Issues Being Addressed:  Hypothyroidism Unclear for how long and give his age, will start on lower dose per below. STARTED synthroid 50 mcg daily    Pre-diabetes CBGs BID  Restless Leg Syndrome Interrupt patient's sleep. Suspected exacerbated by antidepressants. Ferritin lvl 56 (threshold <75), no anemia STARTED PO multivitamin with Fe Recommend outpatient follow-up after 2-3 months to recheck serum ferritin levels   HLD  h/o CVA - home atorvastatin 80 mg qHS, plavix 75 mg daily HIV - formulary equivalent to home delstrigo  COPD - home anoro elipta   4. Routine and other pertinent labs:   CMP showed albumin 3.1 and total protein 6.4, otherwise WNL CBC showed neutropenia at 1.4, otherwise WNL BAL <10 Lipid Panel: WNL (12/02/2022) TSH 5.379, FT4 0.5 (12/02/2022)    5. Disposition Planning:  Tentative Date: 6/12 Barrier: depression and medication management Location: home to mom vs residential rehab  Treatment Plan Summary: I certify that inpatient services furnished can reasonably be expected to improve the patient's condition.   Daily contact with patient to assess and evaluate symptoms and progress in treatment and Medication management Daily contact with patient to assess and evaluate symptoms and progress in treatment Patient's case to be discussed in multi-disciplinary team meeting Observation Level: q15 minute checks  Vital signs: q12 hours Precautions: suicide, elopement, and assault The risks/benefits/side-effects/alternatives to this medication were discussed in detail with the patient and time was given for questions. The patient consents to medication trial. The patient consents to medication trial. FDA black box warnings, if present, were discussed. Metabolic profile and EKG  monitoring obtained while on an atypical antipsychotic  Encouraged patient to participate in unit milieu and in scheduled group therapies  Short Term Goals: Ability to identify changes in lifestyle to reduce recurrence of condition will improve, Ability to verbalize feelings will improve, Ability to disclose and discuss suicidal ideas, Ability to demonstrate self-control will improve, Ability to identify and develop effective coping behaviors will improve, Ability to maintain clinical measurements within normal limits will improve, Compliance with prescribed medications will improve, and Ability to identify triggers associated with substance abuse/mental health issues will improve Long Term Goals: Improvement in symptoms so as ready for discharge Social work and case management to assist with discharge planning and identification of hospital follow-up needs prior to discharge Estimated LOS: 5-7 days Discharge Concerns: Need to establish a safety plan; Medication compliance and effectiveness Discharge Goals: Return home with outpatient referrals for mental health follow-up including medication management/psychotherapy  Total Time spent with patient:  Patient's case was discussed with Attending, see attestation for more information.   Signed: Princess Bruins, DO Psychiatry Resident, PGY-2 [facility]  [address] [place], [place] [address] Ph: [phone] Fax: [phone]

## 2022-12-05 NOTE — Progress Notes (Addendum)
   12/05/22 2130  Psych Admission Type (Psych Patients Only)  Admission Status Voluntary  Psychosocial Assessment  Patient Complaints None  Eye Contact Fair  Facial Expression Flat  Affect Depressed  Speech Logical/coherent  Interaction Assertive  Motor Activity Other (Comment) (WNL)  Appearance/Hygiene Unremarkable  Behavior Characteristics Cooperative;Calm  Mood Pleasant  Thought Process  Coherency WDL  Content WDL  Delusions None reported or observed  Perception Hallucinations  Hallucination Auditory  Judgment Impaired  Confusion None  Danger to Self  Current suicidal ideation? Denies  Agreement Not to Harm Self Yes  Description of Agreement Verbal contract for safety  Danger to Others  Danger to Others None reported or observed   Pt reports 3/10 anxiety and 5/10 depression. Pt was offered support and encouragement. Pt endorsed auditory hallucinations that were "mumbling's." Pt was given scheduled medications. Given PRN Trazodone and Mylanta per MAR. Pt was encouraged to attend groups. Q 15 minute checks were done for safety. Pt did not attend group, remained in room asleep. Minimally interactive with peers and staff. Pt has no complaints.Pt receptive to treatment and safety maintained on unit.

## 2022-12-06 LAB — GLUCOSE, CAPILLARY
Glucose-Capillary: 132 mg/dL — ABNORMAL HIGH (ref 70–99)
Glucose-Capillary: 112 mg/dL — ABNORMAL HIGH (ref 70–99)

## 2022-12-06 MED ORDER — METFORMIN HCL ER 500 MG PO TB24
500.0000 mg | ORAL_TABLET | Freq: Every day | ORAL | Status: DC
  Administered 2022-12-06 – 2022-12-08 (×3): 500 mg via ORAL
  Filled 2022-12-06 (×5): qty 1

## 2022-12-06 NOTE — Progress Notes (Signed)
[facility] MD Progress Note  Date: 12/06/2022 8:40 AM Name: Charles Charles  DOB: September 28, 1960 MRN: [id number] Unit: [id number]  CC: active SI  REASON FOR ADMISSION   Charles Charles is a 62 y.o. male with PMH of cocaine use d/o, stimulant induced psycosis and depression, cannabis use d/o, tobacco use d/o, past inpatient psych admission, remote suicide attempt, HIV, syphilis, CVAs, who presented to [facility] (12/02/2022) with daughter, then admitted to [facility] (12/02/2022) for active suicidal ideation with plan to run into traffic in the setting of cocaine use.   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 Patient was pleasant and engaged during evaluation.  Diarrhea side effects is improving.  Made efforts to call the resources for residential rehab. Stated that daughter is available for call at 8-9A or 3-4P (keep having difficulties reaching each other due to work).  Mood:Depressed, Anxious-improving Sleep:Fair Appetite: Good -but does not like the food here, requested Ensure WU:[id number] Thoughts: No (Denied active and passive SI hide) WG:[id number] Thoughts:  No VHQ:[id number]: None, no paranoia  Review of Systems  Respiratory:  Negative for shortness of breath.   Cardiovascular:  Negative for chest pain.  Gastrointestinal:  Positive for diarrhea. Negative for nausea and vomiting.  Neurological:  Negative for dizziness and headaches.   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, cannabis use d/o Inpatient treatment: yes - [facility] 2017, [facility] 2022, [place] 2024 Suicide: yes, remotely - cutting wrists, medication OD Medication history:  Haldol dec + cogentin, zyprexa, seroquel Medication compliance: poor   Past Medical/Surgical History:  Medical Diagnoses:  has a past medical history of Alcohol abuse (01/18/2021), Alcohol use disorder, moderate, dependence (HCC) (04/10/2016), Cannabis use disorder, moderate, dependence (HCC) (05/06/2016), COPD (chronic obstructive pulmonary disease) (HCC) (12/03/2022), COPD exacerbation (HCC) (12/03/2022), Drug overdose (01/05/2022), GERD (gastroesophageal reflux disease), Headache, History of syphilis (12/03/2022), Neutropenia (HCC) (12/03/2022), Schizo-affective schizophrenia (HCC), and Suicidal ideation (04/09/2016).  Prior Hosp: multiple for CVAs PCP: [facility]  - in [place] Allergies: Penicillins and Sulfa antibiotics    Family Psychiatric History:  Psych Rx: mom with anxiety   Social History:  Housing: Lives with mom H/o of housing instability Finances: unemployed, disability check  Support: daughter Children: Daughter x2, son x1. Oldest daughter is deceased   Substance Use History: Alcohol:  reports current alcohol use of about 56.0 standard drinks of alcohol per week. 1-2 beers about once a month Nicotine:  reports that he has been smoking cigarettes. He has been smoking an average of 2 packs per day. He has never used smokeless  tobacco.  Marijuana: intermittently Stimulants: smokes $40 worth of cocaine daily - using cocaine since 62yo Opiates: Denied Sedative/hypnotics: Denied Hallucinogens: Denied H/O Detox / Rehab: Denied   Past Medical History:  Past Medical History:  Diagnosis Date   Alcohol abuse 01/18/2021   Alcohol use disorder, moderate, dependence (HCC) 04/10/2016   Cannabis use disorder, moderate, dependence (HCC) 05/06/2016   COPD (chronic obstructive pulmonary disease) (HCC) 12/03/2022   COPD exacerbation (HCC) 12/03/2022   Drug overdose 01/05/2022   GERD (gastroesophageal reflux disease)    Headache    History of syphilis 12/03/2022   Neutropenia (HCC) 12/03/2022   Schizo-affective schizophrenia (HCC)    Suicidal ideation 04/09/2016    Past Surgical History:  Procedure Laterality Date   COLONOSCOPY WITH PROPOFOL N/A 08/04/2015   Procedure: COLONOSCOPY WITH PROPOFOL;  Surgeon: Midge Minium, MD;  Location: [facility];  Service: Endoscopy;  Laterality: N/A;   INCISION AND DRAINAGE PERIRECTAL ABSCESS N/A 07/14/2015   Procedure: IRRIGATION AND DEBRIDEMENT PERIRECTAL ABSCESS;  Surgeon: Lattie Haw, MD;  Location: [facility];  Service: General;  Laterality: N/A;   INCISION AND DRAINAGE PERIRECTAL ABSCESS N/A 08/14/2015   Procedure: IRRIGATION AND DEBRIDEMENT PERIRECTAL ABSCESS;  Surgeon: Gladis Riffle, MD;  Location: [facility];  Service: General;  Laterality: N/A;   Family History:  Family History  Problem Relation Age of Onset   Anxiety disorder Mother    Diabetes Mother    Diabetes Father    Social History:  Social History   Substance and Sexual Activity  Alcohol Use Yes   Alcohol/week: 56.0 standard drinks of alcohol   Types: 56 Cans of beer per week   Comment: 2 x 40's daily - pt says no alcohol for several weeks     Social History   Substance and Sexual Activity  Drug Use Yes   Types: Cocaine, Marijuana   Comment: back in the days    Social History    Socioeconomic History   Marital status: Single    Spouse name: Not on file   Number of children: Not on file   Years of education: Not on file   Highest education level: Not on file  Occupational History   Not on file  Tobacco Use   Smoking status: Every Day    Packs/day: 2    Types: Cigarettes   Smokeless tobacco: Never   Tobacco comments:    (1-2 cigs/day)  Vaping Use   Vaping Use: Never used  Substance and Sexual Activity   Alcohol use: Yes    Alcohol/week: 56.0 standard drinks of alcohol    Types: 56 Cans of beer per week    Comment: 2 x 40's daily - pt says no alcohol for several weeks   Drug use: Yes    Types: Cocaine, Marijuana    Comment: back in the days   Sexual activity: Never  Other Topics Concern   Not on file  Social History Narrative   Not on file   Social Determinants of Health   Financial Resource Strain: Not on file  Food Insecurity: No Food Insecurity (12/03/2022)   Hunger Vital Sign    Worried About Running Out of Food in the Last Year: Never true    Ran Out of Food in the Last Year: Never true  Transportation Needs: No Transportation Needs (12/03/2022)   PRAPARE - Administrator, Civil Service (Medical): No    Lack of Transportation (Non-Medical): No  Physical Activity: Not on file  Stress: Not on  file  Social Connections: Not on file   Additional Social History:                         Current Medications: Current Facility-Administered Medications  Medication Dose Route Frequency Provider Last Rate Last Admin   acetaminophen (TYLENOL) tablet 650 mg  650 mg Oral Q6H PRN Onuoha, Chinwendu V, NP   650 mg at 12/05/22 0737   albuterol (VENTOLIN HFA) 108 (90 Base) MCG/ACT inhaler 2 puff  2 puff Inhalation Q6H PRN Princess Bruins, DO       alum & mag hydroxide-simeth (MAALOX/MYLANTA) 200-200-20 MG/5ML suspension 30 mL  30 mL Oral Q4H PRN Onuoha, Chinwendu V, NP   30 mL at 12/05/22 2133   atorvastatin (LIPITOR) tablet 80 mg  80  mg Oral QHS Princess Bruins, DO   80 mg at 12/05/22 2133   clopidogrel (PLAVIX) tablet 75 mg  75 mg Oral Daily Princess Bruins, DO   75 mg at 12/06/22 0744   tenofovir (VIREAD) tablet 300 mg  300 mg Oral Daily Rex Kras, MD   300 mg at 12/06/22 0744   And   lamiVUDine (EPIVIR) tablet 300 mg  300 mg Oral Daily Rex Kras, MD   300 mg at 12/06/22 0743   And   doravirine (PIFELTRO) tablet 100 mg  100 mg Oral Daily Rex Kras, MD   100 mg at 12/06/22 0745   feeding supplement (ENSURE ENLIVE / ENSURE PLUS) liquid 237 mL  237 mL Oral BID BM Princess Bruins, DO   237 mL at 12/05/22 1604   FLUoxetine (PROZAC) capsule 20 mg  20 mg Oral Daily Princess Bruins, DO   20 mg at 12/06/22 0744   haloperidol (HALDOL) tablet 5 mg  5 mg Oral TID PRN Onuoha, Chinwendu V, NP   5 mg at 12/05/22 1053   Or   haloperidol lactate (HALDOL) injection 5 mg  5 mg Intramuscular TID PRN Onuoha, Chinwendu V, NP       hydrOXYzine (ATARAX) tablet 10 mg  10 mg Oral TID PRN Onuoha, Chinwendu V, NP   10 mg at 12/05/22 1053   levothyroxine (SYNTHROID) tablet 50 mcg  50 mcg Oral Q0600 Princess Bruins, DO   50 mcg at 12/06/22 1610   loperamide (IMODIUM) capsule 4 mg  4 mg Oral QID PRN Princess Bruins, DO       multivitamins with iron tablet 1 tablet  1 tablet Oral Daily Princess Bruins, DO   1 tablet at 12/06/22 0744   nicotine polacrilex (NICORETTE) gum 4 mg  4 mg Oral PRN Princess Bruins, DO       polyethylene glycol (MIRALAX / GLYCOLAX) packet 17 g  17 g Oral Daily PRN Princess Bruins, DO       traZODone (DESYREL) tablet 50 mg  50 mg Oral QHS PRN Onuoha, Chinwendu V, NP   50 mg at 12/05/22 2133   umeclidinium-vilanterol (ANORO ELLIPTA) 62.5-25 MCG/ACT 1 puff  1 puff Inhalation Daily Princess Bruins, DO   1 puff at 12/06/22 0745    OBJECTIVE  BP 123/73 (BP Location: Right Arm)   Pulse 85   Temp 98 F (36.7 C) (Oral)   Resp 18   SpO2 99%   Physical Exam Physical Exam Vitals and nursing note reviewed.  Constitutional:       General: He is not in acute distress.    Appearance: He is not ill-appearing, toxic-appearing or diaphoretic.  HENT:     Head:  Normocephalic.  Pulmonary:     Effort: Pulmonary effort is normal. No respiratory distress.  Neurological:     Mental Status: He is alert.     Psychiatric Specialty Exam: Presentation  General Appearance:Appropriate for Environment, Casual, Fairly Groomed Eye Contact:Good Speech:Clear and Coherent, Normal Rate Volume:Normal Handedness:Right  Mood and Affect  Mood:Depressed, Anxious Affect:Appropriate, Congruent, Depressed, Full Range  Thought Process  Thought Process:Coherent, Goal Directed, Linear Descriptions of Associations:Intact  Thought Content Suicidal Thoughts:No (Denied active and passive SI hide) Homicidal Thoughts:No Hallucinations:None Ideas of Reference:None Thought Content:Rumination  Sensorium  Memory:Immediate Good Judgment:Fair Insight:Fair  Executive Functions  Orientation:Full (Time, Place and Person) Language:Good Concentration:Good Attention:Good Recall:Good Fund of Knowledge:Fair  Psychomotor Activity  Psychomotor Activity:Psychomotor Activity: Normal  Assets  Assets:Communication Skills, Desire for Improvement, Resilience, Social Support  Sleep  Quality:Fair  Documented sleep last 24 hours: 8.5   Lab Results:  Results for orders placed or performed during the hospital encounter of 12/03/22 (from the past 48 hour(s))  Glucose, capillary     Status: Abnormal   Collection Time: 12/05/22 11:52 AM  Result Value Ref Range   Glucose-Capillary 131 (H) 70 - 99 mg/dL    Comment: Glucose reference range applies only to samples taken after fasting for at least 8 hours.  Glucose, capillary     Status: Abnormal   Collection Time: 12/05/22  5:22 PM  Result Value Ref Range   Glucose-Capillary 140 (H) 70 - 99 mg/dL    Comment: Glucose reference range applies only to samples taken after fasting for at least 8 hours.   Glucose, capillary     Status: Abnormal   Collection Time: 12/06/22  5:29 AM  Result Value Ref Range   Glucose-Capillary 132 (H) 70 - 99 mg/dL    Comment: Glucose reference range applies only to samples taken after fasting for at least 8 hours.    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 Psychiatric Diagnoses and Treatment:   Cocaine-induced depressive d/o  cocaine use d/o Currently presenting as depressed, psychosis has resolved after cocaine metabolizing out of his system. He's had suicidal ideation as well. Interestingly patient has not been on antidepressant recently. Starting patient on prozac per below, considered other SSRIs, however given patient's stimulant use d/o, patient may prefer a medication that is more stimulating side effects. Discontinued home antipsychotics given patient is no currently psychotic and his psychosis appears to be substance  induce, which ultimately treatment is cessation.   FT4 resulted with hypothyroidism which is likely a large contributor to his mood, rx per below. Diarrhea side effect to prozac improving. Continued prozac 20 mg qAM DISCONTINUED home seroquel and zyprexa - no psychosis, only when using cocaine   Tobacco use d/o  Precontemplative. Smokes 1.5 PPD, declined medications for smoking cessation. NRTs Encouraged cessation    3. Medical Issues Being Addressed:  Hypothyroidism Unclear for how long and give his age, will start on lower dose per below. Continued synthroid 50 mcg daily    Pre-diabetes Initially held of starting metformin due to diarrhea, however will start as diarrhea is improving. CBGs BID STARTED metformin ER 500 mg daily  Restless Leg Syndrome Interrupt patient's sleep. Suspected exacerbated by antidepressants. Ferritin lvl 56 (threshold <75), no anemia Continued PO multivitamin with Fe  HLD  h/o CVA - home atorvastatin 80 mg qHS, plavix 75 mg daily HIV - formulary equivalent to home delstrigo  COPD - home anoro elipta   4. Routine and other pertinent labs:   CMP showed albumin 3.1 and total protein 6.4, otherwise WNL CBC showed neutropenia at 1.4, otherwise WNL BAL <10 Lipid Panel: WNL (12/02/2022) TSH 5.379, FT4 0.5 (12/02/2022)    5. Disposition Planning:  Tentative Date: 6/12 W Barrier: depression and medication management Location: home to mom vs residential rehab  Follow-Up Recs: Repeat ferritin 2-3 months to recheck serum ferritin levels for restless leg Repeat TSH/FT4 in ~3-4weeks for hypothyroidism and increase synthroid as necessary  Repeat A1c outpatient for pre-diabetes  Increase prozac as needed for anxiety/depression/irritability  Treatment Plan Summary: I certify that inpatient services furnished can reasonably be expected to improve the patient's condition.   Daily contact with patient to assess and evaluate symptoms and progress in treatment and Medication management Daily contact with patient  to assess and evaluate symptoms and progress in treatment Patient's case to be discussed in multi-disciplinary team meeting Observation Level: q15 minute checks  Vital signs: q12 hours Precautions: suicide, elopement, and assault The risks/benefits/side-effects/alternatives to this medication were discussed in detail with the patient and time was given for questions. The patient consents to medication trial. The patient consents to medication trial. FDA black box warnings, if present, were discussed. Metabolic profile and EKG monitoring obtained while on an atypical antipsychotic  Encouraged patient to participate in unit milieu and in scheduled group therapies  Short Term Goals: Ability to identify changes in lifestyle to reduce recurrence of condition will improve, Ability to verbalize feelings will improve, Ability to disclose and discuss suicidal ideas, Ability to demonstrate self-control will improve, Ability to identify and develop effective coping behaviors will improve, Ability to maintain clinical measurements within normal limits will improve, Compliance with prescribed medications will improve, and Ability to identify triggers associated with substance abuse/mental health issues will improve Long Term Goals: Improvement in symptoms so as ready for discharge Social work and case management to assist with discharge planning and identification of hospital follow-up needs prior to discharge Estimated LOS: 5-7 days Discharge Concerns: Need to establish a safety plan; Medication compliance and effectiveness Discharge Goals: Return home with outpatient referrals for mental health follow-up including medication management/psychotherapy  Total Time spent with patient:  Patient's case was discussed with Attending, see attestation for more information.   Signed: Princess Bruins, DO Psychiatry Resident, PGY-2 [facility]  [address] [place], [place] [address] Ph: [phone] Fax:  [phone]

## 2022-12-06 NOTE — BHH Counselor (Addendum)
B[facility]/BMU LCSW Progress Note   12/06/2022    2:36 PM  [name]      Type of Note: Resources List for Residential    CSW reprinted and made sure the phone numbers were bigger for patient. Patient said thank you and put them in his folder.   Patient was denied from [facility] and [facility] treatment center.     Signed:   Jacob Moores, MSW, [facility] 12/06/2022 2:36 PM

## 2022-12-06 NOTE — BHH Group Notes (Signed)

## 2022-12-06 NOTE — Progress Notes (Signed)
   12/06/22 0601  15 Minute Checks  Location Bedroom  Visual Appearance Calm  Behavior Composed  Sleep (Behavioral Health Patients Only)  Calculate sleep? (Click Yes once per 24 hr at 0600 safety check) Yes  Documented sleep last 24 hours 8.5

## 2022-12-06 NOTE — BHH Suicide Risk Assessment (Signed)
[facility] INPATIENT:  Family/Significant Other Suicide Prevention Education  Suicide Prevention Education:  Education Completed; Shanda Bumps( daughter ) [phone],  (name of family member/significant other) has been identified by the patient as the family member/significant other with whom the patient will be residing, and identified as the person(s) who will aid the patient in the event of a mental health crisis (suicidal ideations/suicide attempt).  With written consent from the patient, the family member/significant other has been provided the following suicide prevention education, prior to the and/or following the discharge of the patient.  CSW spoke with patient daughter and completed safety planning. Daughter stated that patient would need to get into some type of residential to help with his drug use or he will go back to the streets. Daughter states that it is a cycle for patient with doing drugs and going to the hospital. CSW explained to daughter patient options and daughter asked if we could try [place] residential. CSW reached out to [facility] and according to intake they do have availability for Detox and CSW sent referral over.  Daughter did decline patient having access to weapons or guns.   The suicide prevention education provided includes the following: Suicide risk factors Suicide prevention and interventions National Suicide Hotline telephone number [facility] assessment telephone number [facility] Emergency Assistance 911 [facility] and/or Residential Mobile Crisis Unit telephone number  Request made of family/significant other to: Remove weapons (e.g., guns, rifles, knives), all items previously/currently identified as safety concern.   Remove drugs/medications (over-the-counter, prescriptions, illicit drugs), all items previously/currently identified as a safety concern.  The family member/significant other verbalizes understanding of the suicide  prevention education information provided.  The family member/significant other agrees to remove the items of safety concern listed above.  Charles Charles 12/06/2022, 3:16 PM

## 2022-12-06 NOTE — Group Note (Signed)
Date:  12/06/2022 Time:  4:30 PM  Group Topic/Focus:  Goals Group:   The focus of this group is to help patients establish daily goals to achieve during treatment and discuss how the patient can incorporate goal setting into their daily lives to aide in recovery. Orientation:   The focus of this group is to educate the patient on the purpose and policies of crisis stabilization and provide a format to answer questions about their admission.  The group details unit policies and expectations of patients while admitted.    Participation Level:  Active  Participation Quality:  Appropriate  Affect:  Appropriate  Cognitive:  Appropriate  Insight: Appropriate  Engagement in Group:  Engaged  Modes of Intervention:  Activity, Discussion, and Orientation  Additional Comments:   Pt attended and participated in the Orientation/Goals group.  Edmund Hilda Anglea Gordner 12/06/2022, 4:30 PM

## 2022-12-06 NOTE — Group Note (Signed)
Recreation Therapy Group Note   Group Topic:Other  Group Date: 12/06/2022 Start Time: 1300 End Time: 1400 Facilitators: Kadi Hession-McCall, LRT,CTRS Location: 300 Hall Dayroom   Activity Description/Intervention: Therapeutic Drumming. Patients with peers and staff were given the opportunity to engage in a leader facilitated [facility] Empowerment Drumming Circle with staff from the [facility], in partnership with [employer]. Community volunteer and trained drum facilitator, John Beck leading with LRT observing and documenting intervention and pt response. This evidenced-based practice targets 7 areas of health and wellbeing in the human experience including: stress-reduction, exercise, self-expression, camaraderie/support, nurturing, spirituality, and music-making (leisure).   Goal Area(s) Addresses:  Patient will engage in pro-social way in music group.  Patient will follow directions of drum leader on the first prompt. Patient will demonstrate no behavioral issues during group.  Patient will identify if a reduction in stress level occurs as a result of participation in therapeutic drum circle.    Affect/Mood: N/A   Participation Level: Did not attend    Clinical Observations/Individualized Feedback:    Plan: Continue to engage patient in RT group sessions 2-3x/week.   Charles Charles, LRT,CTRS 12/06/2022 2:58 PM 

## 2022-12-06 NOTE — Progress Notes (Signed)
Patient denies SI, HI, and AVH. States that he was agitated yesterday after an issue with another patient and after he calmed down, he no longer experienced AH of "mumbling." Patient is pleasant and cooperative, taking medications with no issue, pleasant and interactive in the milieu today. Patient remains safe with q 15 minute checks ongoing.   12/06/22 1100  Psych Admission Type (Psych Patients Only)  Admission Status Voluntary  Psychosocial Assessment  Patient Complaints None  Eye Contact Fair  Facial Expression Flat  Affect Appropriate to circumstance  Speech Logical/coherent  Interaction Assertive  Motor Activity Other (Comment) (WDL)  Appearance/Hygiene Unremarkable  Behavior Characteristics Cooperative;Calm  Mood Pleasant  Thought Process  Coherency WDL  Content WDL  Delusions None reported or observed  Perception WDL  Hallucination None reported or observed  Judgment WDL  Confusion None  Danger to Self  Current suicidal ideation? Denies  Agreement Not to Harm Self Yes  Description of Agreement verbal  Danger to Others  Danger to Others None reported or observed

## 2022-12-06 NOTE — Group Note (Signed)
Date:  12/06/2022 Time:  6:56 PM  Group Topic/Focus:  Support and Check-in-Open Discussion and Journaling    Participation Level:  Active  Participation Quality:  Appropriate  Affect:  Appropriate  Cognitive:  Appropriate  Insight: Appropriate  Engagement in Group:  Engaged  Modes of Intervention:  Activity, Discussion, and Support  Additional Comments:   Pt  attended and actively participated in the support check in, journaling and open discussion group.  Edmund Hilda Jaysten Essner 12/06/2022, 6:56 PM

## 2022-12-06 NOTE — Progress Notes (Signed)
Pt did attend AA group and actively participated.

## 2022-12-07 LAB — GLUCOSE, CAPILLARY
Glucose-Capillary: 122 mg/dL — ABNORMAL HIGH (ref 70–99)
Glucose-Capillary: 92 mg/dL (ref 70–99)

## 2022-12-07 MED ORDER — ATORVASTATIN CALCIUM 40 MG PO TABS
80.0000 mg | ORAL_TABLET | Freq: Every day | ORAL | Status: DC
  Administered 2022-12-07: 80 mg via ORAL
  Filled 2022-12-07 (×2): qty 2

## 2022-12-07 NOTE — Progress Notes (Signed)
   12/07/22 1124  Psych Admission Type (Psych Patients Only)  Admission Status Voluntary  Psychosocial Assessment  Patient Complaints None  Eye Contact Fair  Facial Expression Animated  Affect Appropriate to circumstance  Speech Logical/coherent  Interaction Assertive  Motor Activity Slow  Appearance/Hygiene Unremarkable  Behavior Characteristics Cooperative;Appropriate to situation;Calm  Mood Pleasant  Thought Process  Coherency WDL  Content WDL  Delusions None reported or observed  Perception Hallucinations  Hallucination Auditory  Judgment Impaired  Confusion None  Danger to Self  Current suicidal ideation? Denies  Agreement Not to Harm Self Yes  Description of Agreement verbal  Danger to Others  Danger to Others None reported or observed

## 2022-12-07 NOTE — Group Note (Signed)
Date:  12/07/2022 Time:  6:20 PM  Group Topic/Focus:  Support and Check in- Open Discussion/Journaling    Participation Level:  Active  Participation Quality:  Appropriate  Affect:  Appropriate  Cognitive:  Appropriate  Insight: Appropriate  Engagement in Group:  Engaged  Modes of Intervention:  Activity, Discussion, and Support  Additional Comments:   Pt attended and participated in the Support Check in, journaling and open discussion group.  Charles Charles 12/07/2022, 6:20 PM

## 2022-12-07 NOTE — Group Note (Signed)
Date:  12/07/2022 Time:  10:10 PM   Participation Level:  Active  Participation Quality:  Appropriate and Attentive  Affect:  Appropriate  Cognitive:  Alert and Appropriate  Insight: Appropriate and Good  Engagement in Group:  Engaged  Modes of Intervention:  Discussion and Education  Additional Comments:  Pt attended and participated in group and rated their day a 10/10, due to them D/C tomorrow. Pt states that when they get home they will be taking care of their grandson, and staying away from bad influences   Chrisandra Netters 12/07/2022, 10:10 PM

## 2022-12-07 NOTE — Group Note (Signed)
Date:  12/07/2022 Time:  4:51 PM  Group Topic/Focus:  Dimensions of Wellness:   The focus of this group is to introduce the topic of wellness and discuss the role each dimension of wellness plays in total health.    Participation Level:  Active  Participation Quality:  Appropriate  Affect:  Appropriate  Cognitive:  Appropriate  Insight: Appropriate  Engagement in Group:  Engaged  Modes of Intervention:  Activity and Discussion  Additional Comments:   Pt attended and participated in the Social Wellness Group.   Edmund Hilda Naksh Radi 12/07/2022, 4:51 PM

## 2022-12-07 NOTE — [facility] Suicide Risk Assessment (Signed)
[facility] INPATIENT:  Family/Significant Other Suicide Prevention Education  Suicide Prevention Education:  Education Completed; Blair Heys,  (name of family member/significant other) has been identified by the patient as the family member/significant other with whom the patient will be residing, and identified as the person(s) who will aid the patient in the event of a mental health crisis (suicidal ideations/suicide attempt).  With written consent from the patient, the family member/significant other has been provided the following suicide prevention education, prior to the and/or following the discharge of the patient.  The suicide prevention education provided includes the following: Suicide risk factors Suicide prevention and interventions National Suicide Hotline telephone number [facility] assessment telephone number [facility] Emergency Assistance 911 [facility] and/or Residential Mobile Crisis Unit telephone number  Request made of family/significant other to: Remove weapons (e.g., guns, rifles, knives), all items previously/currently identified as safety concern.   Remove drugs/medications (over-the-counter, prescriptions, illicit drugs), all items previously/currently identified as a safety concern.  The family member/significant other verbalizes understanding of the suicide prevention education information provided.  The family member/significant other agrees to remove the items of safety concern listed above.  [address] [place] 12/07/2022, 2:13 PM

## 2022-12-07 NOTE — Progress Notes (Signed)
[facility] MD Progress Note  Date: 12/07/2022 11:07 AM Name: Charles Charles  DOB: Oct 16, 1960 MRN: [id number] Unit: [id number]  CC: active SI  REASON FOR ADMISSION   Charles Charles is a 62 y.o. male with PMH of cocaine use d/o, stimulant induced psycosis and depression, cannabis use d/o, tobacco use d/o, past inpatient psych admission, remote suicide attempt, HIV, syphilis, CVAs, who presented to [facility] (12/02/2022) with daughter, then admitted to [facility] (12/02/2022) for active suicidal ideation with plan to run into traffic in the setting of cocaine use.   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 Agitation PRNs: Taking only trazodone for sleep Per nursing staff: No complaints stable. Documented sleep last 24 hours: 7.25   SUBJECTIVE  Patient was seen on the unit today.  He reports this Clinical research associate requesting discharge.  He reports that he is doing well and appeared pleasant and engaged during the interview.  Diarrhea is improving.  His sleep is improving.  He denies any cravings.  However today he claims that his mother is available for a phone call and that he would be going back to her house.  This has not yet been verified.  Patient was initially looking for rehab but now endorses improved symptoms and that he wants to go home and take care of his mother. He is making some effort to call  resources for rehab but now seems to have reconciled and wants to go straight home.  He is unclear if mother is willing to take him home without him going to rehab. The plan is to continue current treatment.  Get some collateral from his mother.  Possible discharge on Sunday.  Mood:Anxious-about discharge. Sleep:Fair Appetite: Did not have any complaints today. WU:[id number] Thoughts: No WG:NFAOZHYQM Thoughts: No VHQ:[id number]: None, no paranoia  Review of Systems  Respiratory:  Negative for shortness of breath.   Cardiovascular:  Negative for chest pain.  Gastrointestinal:  Positive for diarrhea. Negative for nausea and vomiting.  Neurological:  Negative for dizziness and headaches.   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, cannabis use d/o Inpatient treatment: yes - [facility] 2017, [facility] 2022, [place] 2024 Suicide: yes, remotely - cutting wrists, medication OD Medication history:  Haldol dec + cogentin, zyprexa, seroquel Medication compliance: poor   Past Medical/Surgical History:  Medical Diagnoses:  has a past medical history of Alcohol abuse (01/18/2021), Alcohol use disorder, moderate, dependence (HCC) (04/10/2016), Cannabis use disorder, moderate, dependence (HCC) (05/06/2016), COPD (chronic obstructive pulmonary disease) (HCC) (12/03/2022), COPD exacerbation (HCC) (12/03/2022), Drug overdose (01/05/2022), GERD (gastroesophageal reflux disease), Headache, History of syphilis (12/03/2022), Neutropenia (HCC) (12/03/2022), Schizo-affective schizophrenia (HCC), and Suicidal ideation (04/09/2016).  Prior Hosp: multiple for CVAs PCP: [facility]  - in [place] Allergies: Penicillins and Sulfa antibiotics    Family Psychiatric History:  Psych  Rx: mom with anxiety   Social History:  Housing: Lives with mom H/o of housing  instability Finances: unemployed, disability check  Support: daughter Children: Daughter x2, son x1. Oldest daughter is deceased   Substance Use History: Alcohol:  reports current alcohol use of about 56.0 standard drinks of alcohol per week. 1-2 beers about once a month Nicotine:  reports that he has been smoking cigarettes. He has been smoking an average of 2 packs per day. He has never used smokeless tobacco. Marijuana: intermittently Stimulants: smokes $40 worth of cocaine daily - using cocaine since 62yo Opiates: Denied Sedative/hypnotics: Denied Hallucinogens: Denied H/O Detox / Rehab: Denied   Past Medical History:  Past Medical History:  Diagnosis Date   Alcohol abuse 01/18/2021   Alcohol use disorder, moderate, dependence (HCC) 04/10/2016   Cannabis use disorder, moderate, dependence (HCC) 05/06/2016   COPD (chronic obstructive pulmonary disease) (HCC) 12/03/2022   COPD exacerbation (HCC) 12/03/2022   Drug overdose 01/05/2022   GERD (gastroesophageal reflux disease)    Headache    History of syphilis 12/03/2022   Neutropenia (HCC) 12/03/2022   Schizo-affective schizophrenia (HCC)    Suicidal ideation 04/09/2016    Past Surgical History:  Procedure Laterality Date   COLONOSCOPY WITH PROPOFOL N/A 08/04/2015   Procedure: COLONOSCOPY WITH PROPOFOL;  Surgeon: Midge Minium, MD;  [facility];  Service: Endoscopy;  Laterality: N/A;   INCISION AND DRAINAGE PERIRECTAL ABSCESS N/A 07/14/2015   Procedure: IRRIGATION AND DEBRIDEMENT PERIRECTAL ABSCESS;  Surgeon: Lattie Haw, MD;  Location: [facility];  Service: General;  Laterality: N/A;   INCISION AND DRAINAGE PERIRECTAL ABSCESS N/A 08/14/2015   Procedure: IRRIGATION AND DEBRIDEMENT PERIRECTAL ABSCESS;  Surgeon: Gladis Riffle, MD;  Location: [facility];  Service: General;  Laterality: N/A;   Family History:  Family History  Problem Relation Age of Onset   Anxiety disorder Mother    Diabetes Mother     Diabetes Father    Social History:  Social History   Substance and Sexual Activity  Alcohol Use Yes   Alcohol/week: 56.0 standard drinks of alcohol   Types: 56 Cans of beer per week   Comment: 2 x 40's daily - pt says no alcohol for several weeks     Social History   Substance and Sexual Activity  Drug Use Yes   Types: Cocaine, Marijuana   Comment: back in the days    Social History   Socioeconomic History   Marital status: Single    Spouse name: Not on file   Number of children: Not on file   Years of education: Not on file   Highest education level: Not on file  Occupational History   Not on file  Tobacco Use   Smoking status: Every Day    Packs/day: 2    Types: Cigarettes   Smokeless tobacco: Never   Tobacco comments:    (1-2 cigs/day)  Vaping Use   Vaping Use: Never used  Substance and Sexual Activity   Alcohol use: Yes    Alcohol/week: 56.0 standard drinks of alcohol    Types: 56 Cans of beer per week    Comment: 2 x 40's daily - pt says no alcohol for several weeks   Drug use: Yes    Types: Cocaine, Marijuana    Comment: back in the days   Sexual activity: Never  Other Topics Concern   Not on file  Social History Narrative   Not on file   Social Determinants of Health  Financial Resource Strain: Not on file  Food Insecurity: No Food Insecurity (12/03/2022)   Hunger Vital Sign    Worried About Running Out of Food in the Last Year: Never true    Ran Out of Food in the Last Year: Never true  Transportation Needs: No Transportation Needs (12/03/2022)   PRAPARE - Administrator, Civil Service (Medical): No    Lack of Transportation (Non-Medical): No  Physical Activity: Not on file  Stress: Not on file  Social Connections: Not on file   Additional Social History:                         Current Medications: Current Facility-Administered Medications  Medication Dose Route Frequency Provider Last Rate Last Admin   acetaminophen  (TYLENOL) tablet 650 mg  650 mg Oral Q6H PRN Onuoha, Chinwendu V, NP   650 mg at 12/05/22 0737   albuterol (VENTOLIN HFA) 108 (90 Base) MCG/ACT inhaler 2 puff  2 puff Inhalation Q6H PRN Princess Bruins, DO       alum & mag hydroxide-simeth (MAALOX/MYLANTA) 200-200-20 MG/5ML suspension 30 mL  30 mL Oral Q4H PRN Onuoha, Chinwendu V, NP   30 mL at 12/07/22 0619   atorvastatin (LIPITOR) tablet 80 mg  80 mg Oral QHS Princess Bruins, DO   80 mg at 12/06/22 2107   clopidogrel (PLAVIX) tablet 75 mg  75 mg Oral Daily Princess Bruins, DO   75 mg at 12/07/22 3244   tenofovir (VIREAD) tablet 300 mg  300 mg Oral Daily Rex Kras, MD   300 mg at 12/07/22 0102   And   lamiVUDine (EPIVIR) tablet 300 mg  300 mg Oral Daily Rex Kras, MD   300 mg at 12/07/22 0736   And   doravirine (PIFELTRO) tablet 100 mg  100 mg Oral Daily Rex Kras, MD   100 mg at 12/07/22 0737   feeding supplement (ENSURE ENLIVE / ENSURE PLUS) liquid 237 mL  237 mL Oral BID BM Princess Bruins, DO   237 mL at 12/07/22 1020   FLUoxetine (PROZAC) capsule 20 mg  20 mg Oral Daily Princess Bruins, DO   20 mg at 12/07/22 0734   haloperidol (HALDOL) tablet 5 mg  5 mg Oral TID PRN Onuoha, Chinwendu V, NP   5 mg at 12/05/22 1053   Or   haloperidol lactate (HALDOL) injection 5 mg  5 mg Intramuscular TID PRN Onuoha, Chinwendu V, NP       hydrOXYzine (ATARAX) tablet 10 mg  10 mg Oral TID PRN Onuoha, Chinwendu V, NP   10 mg at 12/05/22 1053   levothyroxine (SYNTHROID) tablet 50 mcg  50 mcg Oral Q0600 Princess Bruins, DO   50 mcg at 12/07/22 0617   loperamide (IMODIUM) capsule 4 mg  4 mg Oral QID PRN Princess Bruins, DO       metFORMIN (GLUCOPHAGE-XR) 24 hr tablet 500 mg  500 mg Oral Q breakfast Princess Bruins, DO   500 mg at 12/07/22 0736   multivitamins with iron tablet 1 tablet  1 tablet Oral Daily Princess Bruins, DO   1 tablet at 12/07/22 0736   nicotine polacrilex (NICORETTE) gum 4 mg  4 mg Oral PRN Princess Bruins, DO       polyethylene glycol  (MIRALAX / GLYCOLAX) packet 17 g  17 g Oral Daily PRN Princess Bruins, DO       traZODone (DESYREL) tablet 50 mg  50 mg Oral QHS PRN  Onuoha, Chinwendu V, NP   50 mg at 12/06/22 2108   umeclidinium-vilanterol (ANORO ELLIPTA) 62.5-25 MCG/ACT 1 puff  1 puff Inhalation Daily Princess Bruins, DO   1 puff at 12/07/22 0737    OBJECTIVE  BP 114/78 (BP Location: Right Arm)   Pulse 88   Temp 98.2 F (36.8 C) (Oral)   Resp 20   SpO2 97%   Physical Exam Physical Exam Vitals and nursing note reviewed.  Constitutional:      General: He is not in acute distress.    Appearance: He is not ill-appearing, toxic-appearing or diaphoretic.  HENT:     Head: Normocephalic.  Pulmonary:     Effort: Pulmonary effort is normal. No respiratory distress.  Neurological:     Mental Status: He is alert.     Psychiatric Specialty Exam: Presentation  General Appearance:Casual Eye Contact:Fair Speech:Clear and Coherent Volume:Normal Handedness:Right  Mood and Affect  Mood:Anxious Affect:Appropriate  Thought Process  Thought Process:Linear Descriptions of Associations:Intact  Thought Content Suicidal Thoughts:No Homicidal Thoughts:No Hallucinations:None Ideas of Reference:None Thought Content:Rumination  Sensorium  Memory:Immediate Fair, Remote Fair, Recent Fair Judgment:Fair Insight:Fair  Executive Functions  Orientation:Full (Time, Place and Person) Language:Good Concentration:Fair Attention:Fair Recall:Good Fund of Knowledge:Good  Psychomotor Activity  Psychomotor Activity:Psychomotor Activity: Normal  Assets  Assets:Communication Skills, Desire for Improvement, Housing  Sleep  Quality:Fair  Documented sleep last 24 hours: 7.25   Lab Results:  Results for orders placed or performed during the hospital encounter of 12/03/22 (from the past 48 hour(s))  Glucose, capillary     Status: Abnormal   Collection Time: 12/05/22 11:52 AM  Result Value Ref Range   Glucose-Capillary 131 (H)  70 - 99 mg/dL    Comment: Glucose reference range applies only to samples taken after fasting for at least 8 hours.  Glucose, capillary     Status: Abnormal   Collection Time: 12/05/22  5:22 PM  Result Value Ref Range   Glucose-Capillary 140 (H) 70 - 99 mg/dL    Comment: Glucose reference range applies only to samples taken after fasting for at least 8 hours.  Glucose, capillary     Status: Abnormal   Collection Time: 12/06/22  5:29 AM  Result Value Ref Range   Glucose-Capillary 132 (H) 70 - 99 mg/dL    Comment: Glucose reference range applies only to samples taken after fasting for at least 8 hours.  Glucose, capillary     Status: Abnormal   Collection Time: 12/06/22  6:29 PM  Result Value Ref Range   Glucose-Capillary 112 (H) 70 - 99 mg/dL    Comment: Glucose reference range applies only to samples taken after fasting for at least 8 hours.  Glucose, capillary     Status: Abnormal   Collection Time: 12/07/22  5:40 AM  Result Value Ref Range   Glucose-Capillary 122 (H) 70 - 99 mg/dL    Comment: Glucose reference range applies only to samples taken after fasting for at least 8 hours.    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 Psychiatric Diagnoses and Treatment:   Cocaine-induced depressive d/o  cocaine use d/o Currently presenting as depressed, psychosis has resolved after cocaine metabolizing out of his system. He's had suicidal ideation as well. Interestingly patient has not been on antidepressant recently. Starting patient on prozac per below, considered other SSRIs, however given patient's stimulant use d/o, patient may prefer a medication that is more stimulating side effects. Discontinued home antipsychotics given patient is no currently psychotic and his psychosis appears to be substance induce, which ultimately treatment is cessation.  FT4 resulted with hypothyroidism which is likely a large contributor to his mood, rx per below. Diarrhea side effect to prozac improving. Continued prozac 20 mg qAM DISCONTINUED home seroquel and zyprexa - no psychosis, only when using cocaine   Tobacco use d/o  Precontemplative. Smokes 1.5 PPD, declined medications for smoking cessation. NRTs Encouraged cessation    3. Medical Issues Being Addressed:  Hypothyroidism Unclear for how long and give his age, will start on lower dose per below. Continued synthroid 50 mcg daily    Pre-diabetes Initially held of starting metformin due to diarrhea, however will start as diarrhea is improving. CBGs BID STARTED metformin ER 500 mg daily  Restless Leg Syndrome Interrupt patient's sleep. Suspected exacerbated by antidepressants. Ferritin lvl 56 (threshold <75), no anemia Continued PO multivitamin with Fe  HLD  h/o CVA - home atorvastatin 80 mg qHS, plavix 75 mg daily HIV - formulary equivalent to home delstrigo  COPD - home anoro elipta   4. Routine  and other pertinent labs:   CMP showed albumin 3.1 and total protein 6.4, otherwise WNL CBC showed neutropenia at 1.4, otherwise WNL BAL <10 Lipid Panel: WNL (12/02/2022) TSH 5.379, FT4 0.5 (12/02/2022)    5. Disposition Planning:  Tentative Date: The tentative plan was for discharge on Wednesday 6/12.  However patient is insisting that he will not go to rehab and will go straight to his mother's house.  Once verified possible discharge is 6/9  Barrier: depression and medication management Location: home to mom vs residential rehab  Follow-Up Recs: Repeat ferritin 2-3 months to recheck serum ferritin levels for restless leg Repeat TSH/FT4 in ~3-4weeks for hypothyroidism and increase synthroid as necessary  Repeat A1c outpatient for pre-diabetes  Increase prozac as needed for anxiety/depression/irritability  Treatment Plan Summary: I certify that inpatient services furnished can reasonably be expected to improve the patient's condition.   Daily contact with patient to assess and evaluate symptoms and progress in treatment and Medication management Daily contact with patient to assess and evaluate symptoms and progress in treatment Patient's case to be discussed in multi-disciplinary team meeting Observation Level: q15 minute checks  Vital signs: q12 hours Precautions: suicide, elopement, and assault The risks/benefits/side-effects/alternatives to this medication were discussed in detail with the patient and time was given for questions. The patient  consents to medication trial. The patient consents to medication trial. FDA black box warnings, if present, were discussed. Metabolic profile and EKG monitoring obtained while on an atypical antipsychotic  Encouraged patient to participate in unit milieu and in scheduled group therapies  Short Term Goals: Ability to identify changes in lifestyle to reduce recurrence of condition will improve, Ability to verbalize feelings will improve, Ability to  disclose and discuss suicidal ideas, Ability to demonstrate self-control will improve, Ability to identify and develop effective coping behaviors will improve, Ability to maintain clinical measurements within normal limits will improve, Compliance with prescribed medications will improve, and Ability to identify triggers associated with substance abuse/mental health issues will improve Long Term Goals: Improvement in symptoms so as ready for discharge Social work and case management to assist with discharge planning and identification of hospital follow-up needs prior to discharge Estimated LOS: Possible discharge on 12/08/2022. Discharge Concerns: Need to establish a safety plan; Medication compliance and effectiveness Discharge Goals: Return home with outpatient referrals for mental health follow-up including medication management/psychotherapy  Total Time spent with patient:  20 minutes  Signed: Rex Kras, MD [facility]  [address] [place], [place] [address] Ph: [phone] Fax: [phone] Patient ID: [name], male   DOB: 1960/10/16, 62 y.o.   MRN: [id number]

## 2022-12-07 NOTE — Progress Notes (Signed)
   12/07/22 0600  15 Minute Checks  Location Hallway  Visual Appearance Calm  Behavior Composed  Sleep (Behavioral Health Patients Only)  Calculate sleep? (Click Yes once per 24 hr at 0600 safety check) Yes  Documented sleep last 24 hours 7.25

## 2022-12-07 NOTE — Progress Notes (Signed)
Phone call to patient's mother, confirmed that patient will be able to discharge to her home.   [employer], LCSW

## 2022-12-07 NOTE — Progress Notes (Signed)
   12/06/22 2100  Psych Admission Type (Psych Patients Only)  Admission Status Voluntary  Psychosocial Assessment  Patient Complaints None  Eye Contact Fair  Facial Expression Flat  Affect Appropriate to circumstance  Speech Logical/coherent  Interaction Assertive  Motor Activity Other (Comment) (WNL)  Appearance/Hygiene Unremarkable  Behavior Characteristics Cooperative;Calm  Mood Pleasant  Thought Process  Coherency WDL  Content WDL  Delusions None reported or observed  Perception Hallucinations  Hallucination Auditory  Judgment Impaired  Confusion None  Danger to Self  Current suicidal ideation? Denies  Agreement Not to Harm Self Yes  Description of Agreement Verbal contract for safety  Danger to Others  Danger to Others None reported or observed   Pt reports 0/10 anxiety and 5/10 depression.  Pt is pleasant and cooperative. Pt was offered support and encouragement. Pt was given scheduled medications. Given PRN Mylanta and Trazodone per MAR. Pt attended group and interacts well with peers and staff. Q 15 minute checks were done for safety.  Pt has no complaints.Pt receptive to treatment and safety maintained on unit.

## 2022-12-07 NOTE — Group Note (Signed)
Date:  12/07/2022 Time:  4:31 PM  Group Topic/Focus:  Goals Group:   The focus of this group is to help patients establish daily goals to achieve during treatment and discuss how the patient can incorporate goal setting into their daily lives to aide in recovery. Orientation:   The focus of this group is to educate the patient on the purpose and policies of crisis stabilization and provide a format to answer questions about their admission.  The group details unit policies and expectations of patients while admitted.    Participation Level:  Active  Participation Quality:  Appropriate  Affect:  Appropriate  Cognitive:  Appropriate  Insight: Appropriate  Engagement in Group:  Engaged  Modes of Intervention:  Activity, Discussion, and Orientation  Additional Comments:   Pt attended and participated in the Orientation/Goals Group.  Edmund Hilda Christon Parada 12/07/2022, 4:31 PM

## 2022-12-08 DIAGNOSIS — F1494 Cocaine use, unspecified with cocaine-induced mood disorder: Secondary | ICD-10-CM

## 2022-12-08 LAB — GLUCOSE, CAPILLARY: Glucose-Capillary: 117 mg/dL — ABNORMAL HIGH (ref 70–99)

## 2022-12-08 MED ORDER — LEVOTHYROXINE SODIUM 50 MCG PO TABS: 50.0000 ug | ORAL_TABLET | Freq: Every day | ORAL | 0 refills | Status: DC

## 2022-12-08 MED ORDER — NICOTINE POLACRILEX 4 MG MT GUM: 4.0000 mg | CHEWING_GUM | OROMUCOSAL | 0 refills | Status: DC | PRN

## 2022-12-08 MED ORDER — TRAZODONE HCL 50 MG PO TABS: 50.0000 mg | ORAL_TABLET | Freq: Every evening | ORAL | 0 refills | Status: DC | PRN

## 2022-12-08 MED ORDER — FLUOXETINE HCL 20 MG PO CAPS: 20.0000 mg | ORAL_CAPSULE | Freq: Every day | ORAL | 0 refills | Status: DC

## 2022-12-08 MED ORDER — METFORMIN HCL ER 500 MG PO TB24: 500.0000 mg | ORAL_TABLET | Freq: Every day | ORAL | 0 refills | Status: DC

## 2022-12-08 NOTE — BHH Suicide Risk Assessment (Signed)
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) Active Problems:   Cocaine use disorder, severe, dependence (HCC)   Tobacco use disorder   HIV disease (HCC)   History of syphilis   COPD (chronic obstructive pulmonary disease) (HCC)   Neutropenia (HCC)   Total Time spent with patient: 30 minutes  Musculoskeletal: Strength & Muscle Tone: within normal limits Gait & Station: normal Patient leans: N/A  Psychiatric Specialty Exam  Presentation  General Appearance:  Casual  Eye Contact: Fair  Speech: Clear and Coherent  Speech Volume: Normal  Handedness: Right   Mood and Affect  Mood: Anxious  Duration of Depression Symptoms: Greater than two weeks  Affect: Appropriate   Thought Process  Thought Processes: Linear  Descriptions of Associations:Intact  Orientation:Full (Time, Place and Person)  Thought Content:Rumination  History of Schizophrenia/Schizoaffective disorder:No  Duration of Psychotic Symptoms:No data recorded Hallucinations:Hallucinations: None  Ideas of Reference:None  Suicidal Thoughts:Suicidal Thoughts: No  Homicidal Thoughts:Homicidal Thoughts: No   Sensorium  Memory: Immediate Fair; Remote Fair; Recent Fair  Judgment: Fair  Insight: Fair   Art therapist  Concentration: Fair  Attention Span: Fair  Recall: Good  Fund of Knowledge: Good  Language: Good   Psychomotor Activity  Psychomotor Activity: Psychomotor Activity: Normal   Assets  Assets: Communication Skills; Desire for Improvement; Housing   Sleep  Sleep: Sleep: Fair   Physical Exam: Physical Exam Constitutional:      Appearance: Normal appearance.  Neurological:     Mental Status: He is alert and oriented to person, place,  and time. Mental status is at baseline.    Review of Systems  Psychiatric/Behavioral: Negative.    All other systems reviewed and are negative.  Blood pressure 117/66, pulse 78, temperature 97.8 F (36.6 C), temperature source Oral, resp. rate 18, SpO2 98 %. There is no height or weight on file to calculate BMI.  Mental Status Per Nursing Assessment::   On Admission:  NA  Demographic Factors:  Male and Unemployed  Loss Factors: NA  Historical Factors: Impulsivity  Risk Reduction Factors:   Living with another person, especially a relative  Continued Clinical Symptoms:  Depression:   Impulsivity Alcohol/Substance Abuse/Dependencies Previous Psychiatric Diagnoses and Treatments  Cognitive Features That Contribute To Risk:  None    Suicide Risk:  Mild:  Suicidal ideation of limited frequency, intensity, duration, and specificity.  There are no identifiable plans, no associated intent, mild dysphoria and related symptoms, good self-control (both objective and subjective assessment), few other risk factors, and identifiable protective factors, including available and accessible social support.   Follow-up Information     [facility] [facility] Go on 12/11/2022.   Why: You have a hospital follow up appointment on 12/11/22 at 10:00 am.  This appointment will be held in person.  Following this appointment, you will be scheduled for a clinical assessment, to obtain therapy and medication management services. Contact information: [address] [place] [address] [phone]                 Plan Of Care/Follow-up recommendations:  Activity:  As tolerated  Rex Kras, MD 12/08/2022, 8:50 AM

## 2022-12-08 NOTE — Discharge Summary (Signed)
Physician Discharge Summary Note  Patient:  Charles Charles is an 62 y.o., male MRN:  [id number] DOB:  Mar 30, 1961 Patient phone:  [phone] (home)  Patient address:   [address] [place] [address],  Total Time spent with patient: 30 minutes  Date of Admission:  12/03/2022 Date of Discharge: 12/08/2022  Reason for Admission:  Charles Charles is a 62 y.o. male with PMH of cocaine use d/o, stimulant induced psycosis and depression, cannabis use d/o, tobacco use d/o, past inpatient psych admission, remote suicide attempt, HIV, syphilis, CVAs, who presented to [facility] (12/02/2022) with daughter, then admitted to [facility] (12/02/2022) for active suicidal ideation with plan to run into traffic in the setting of cocaine use.   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) Active Problems:   Cocaine use disorder, severe, dependence (HCC)   Tobacco use disorder   HIV disease (HCC)   History of syphilis   COPD (chronic obstructive pulmonary disease) (HCC)   Neutropenia (HCC)   Past Psychiatric History: Please see H&P  Past Medical History:  Past Medical History:  Diagnosis Date   Alcohol abuse 01/18/2021   Alcohol use disorder, moderate, dependence (HCC) 04/10/2016   Cannabis use disorder, moderate, dependence (HCC) 05/06/2016   COPD (chronic obstructive pulmonary disease) (HCC) 12/03/2022   COPD exacerbation (HCC) 12/03/2022   Drug overdose 01/05/2022   GERD (gastroesophageal reflux disease)    Headache    History of syphilis 12/03/2022   Neutropenia (HCC) 12/03/2022   Schizo-affective schizophrenia (HCC)    Suicidal ideation 04/09/2016    Past Surgical History:  Procedure Laterality Date   COLONOSCOPY WITH PROPOFOL N/A 08/04/2015   Procedure: COLONOSCOPY WITH PROPOFOL;  Surgeon: Midge Minium, MD;  Location: [facility];   Service: Endoscopy;  Laterality: N/A;   INCISION AND DRAINAGE PERIRECTAL ABSCESS N/A 07/14/2015   Procedure: IRRIGATION AND DEBRIDEMENT PERIRECTAL ABSCESS;  Surgeon: Lattie Haw, MD;  Location: [facility];  Service: General;  Laterality: N/A;   INCISION AND DRAINAGE PERIRECTAL ABSCESS N/A 08/14/2015   Procedure: IRRIGATION AND DEBRIDEMENT PERIRECTAL ABSCESS;  Surgeon: Gladis Riffle, MD;  Location: [facility];  Service: General;  Laterality: N/A;   Family History:  Family History  Problem Relation Age of Onset   Anxiety disorder Mother    Diabetes Mother    Diabetes Father    Family Psychiatric  History: Please see H&P Social History:  Social History   Substance and Sexual Activity  Alcohol Use Yes   Alcohol/week: 56.0 standard drinks of alcohol   Types: 56 Cans of beer per week   Comment: 2 x 40's daily - pt says no alcohol for several weeks     Social History   Substance and Sexual Activity  Drug Use Yes   Types: Cocaine, Marijuana   Comment: back in the days    Social History   Socioeconomic History   Marital status: Single    Spouse name: Not on file   Number of children: Not on file   Years of education: Not on file   Highest education level: Not on file  Occupational History   Not on file  Tobacco Use   Smoking status: Every Day    Packs/day: 2    Types: Cigarettes   Smokeless tobacco: Never   Tobacco comments:    (1-2 cigs/day)  Vaping Use   Vaping Use: Never  used  Substance and Sexual Activity   Alcohol use: Yes    Alcohol/week: 56.0 standard drinks of alcohol    Types: 56 Cans of beer per week    Comment: 2 x 40's daily - pt says no alcohol for several weeks   Drug use: Yes    Types: Cocaine, Marijuana    Comment: back in the days   Sexual activity: Never  Other Topics Concern   Not on file  Social History Narrative   Not on file   Social Determinants of Health   Financial Resource Strain: Not on file  Food Insecurity: No Food Insecurity  (12/03/2022)   Hunger Vital Sign    Worried About Running Out of Food in the Last Year: Never true    Ran Out of Food in the Last Year: Never true  Transportation Needs: No Transportation Needs (12/03/2022)   PRAPARE - Administrator, Civil Service (Medical): No    Lack of Transportation (Non-Medical): No  Physical Activity: Not on file  Stress: Not on file  Social Connections: Not on file    Hospital Course:  During the patient's hospitalization, patient had extensive initial psychiatric evaluation, and follow-up psychiatric evaluations every day.  Psychiatric diagnoses provided upon initial assessment: Depression and chronic cocaine use disorder severe.  Patient's psychiatric medications were adjusted on admission: Patient was restarted on his home medications and antidepressant Prozac was started.  During the hospitalization, other adjustments were made to the patient's psychiatric medication regimen: Patient received his HIV medications during this hospitalization along with the Prozac.  He also received trazodone at bedtime for sleep.  Patient's care was discussed during the interdisciplinary team meeting every day during the hospitalization.  The patient denied having side effects to prescribed psychiatric medication.  Gradually, patient started adjusting to milieu. The patient was evaluated each day by a clinical provider to ascertain response to treatment. Improvement was noted by the patient's report of decreasing symptoms, improved sleep and appetite, affect, medication tolerance, behavior, and participation in unit programming.  Patient was asked each day to complete a self inventory noting mood, mental status, pain, new symptoms, anxiety and concerns.    Symptoms were reported as significantly decreased or resolved completely by discharge.   On day of discharge, the patient reports that their mood is stable. The patient denied having suicidal thoughts for more than 48  hours prior to discharge.  Patient denies having homicidal thoughts.  Patient denies having auditory hallucinations.  Patient denies any visual hallucinations or other symptoms of psychosis. The patient was motivated to continue taking medication with a goal of continued improvement in mental health.   The patient reports their target psychiatric symptoms of depression and anxiety responded well to the psychiatric medications, and the patient reports overall benefit other psychiatric hospitalization. Supportive psychotherapy was provided to the patient. The patient also participated in regular group therapy while hospitalized. Coping skills, problem solving as well as relaxation therapies were also part of the unit programming.  Labs were reviewed with the patient, and abnormal results were discussed with the patient.  The patient is able to verbalize their individual safety plan to this provider.  # It is recommended to the patient to continue psychiatric medications as prescribed, after discharge from the hospital.    # It is recommended to the patient to follow up with your outpatient psychiatric provider and PCP.  # It was discussed with the patient, the impact of alcohol, drugs, tobacco have been there  overall psychiatric and medical wellbeing, and total abstinence from substance use was recommended the patient.ed.  # Prescriptions provided or sent directly to preferred pharmacy at discharge. Patient agreeable to plan. Given opportunity to ask questions. Appears to feel comfortable with discharge.    # In the event of worsening symptoms, the patient is instructed to call the crisis hotline, 911 and or go to the nearest ED for appropriate evaluation and treatment of symptoms. To follow-up with primary care provider for other medical issues, concerns and or health care needs  # Patient was discharged home to mother with a plan to follow up as noted below.   Physical Findings: AIMS:  , ,  ,  ,     CIWA:    COWS:     Musculoskeletal: Strength & Muscle Tone: within normal limits Gait & Station: normal Patient leans: N/A   Psychiatric Specialty Exam:  Presentation  General Appearance:  Appropriate for Environment  Eye Contact: Good  Speech: Clear and Coherent  Speech Volume: Normal  Handedness: Right   Mood and Affect  Mood: Euthymic  Affect: Appropriate   Thought Process  Thought Processes: Coherent  Descriptions of Associations:Intact  Orientation:Full (Time, Place and Person)  Thought Content:Logical  History of Schizophrenia/Schizoaffective disorder:No  Duration of Psychotic Symptoms:No data recorded Hallucinations:Hallucinations: None  Ideas of Reference:None  Suicidal Thoughts:Suicidal Thoughts: No  Homicidal Thoughts:Homicidal Thoughts: No   Sensorium  Memory: Immediate Fair; Remote Fair; Recent Fair  Judgment: Fair  Insight: Fair   Art therapist  Concentration: Fair  Attention Span: Fair  Recall: [employer] of Knowledge: Fair  Language: Fair   Psychomotor Activity  Psychomotor Activity: Psychomotor Activity: Normal   Assets  Assets: Communication Skills; Desire for Improvement; Housing   Sleep  Sleep: Sleep: Fair    Physical Exam: Physical Exam Constitutional:      Appearance: Normal appearance. He is normal weight.  Neurological:     Mental Status: He is alert.  Psychiatric:        Mood and Affect: Mood normal.        Behavior: Behavior normal.        Thought Content: Thought content normal.        Judgment: Judgment normal.    Review of Systems  Psychiatric/Behavioral: Negative.    All other systems reviewed and are negative.  Blood pressure 117/66, pulse 78, temperature 97.8 F (36.6 C), temperature source Oral, resp. rate 18, SpO2 98 %. There is no height or weight on file to calculate BMI.   Social History   Tobacco Use  Smoking Status Every Day   Packs/day: 2    Types: Cigarettes  Smokeless Tobacco Never  Tobacco Comments   (1-2 cigs/day)   Tobacco Cessation:  A prescription for an FDA-approved tobacco cessation medication provided at discharge   Blood Alcohol level:  Lab Results  Component Value Date   [facility] <10 12/02/2022   ETH <10 01/05/2022    Metabolic Disorder Labs:  Lab Results  Component Value Date   HGBA1C 6.4 (H) 12/02/2022   MPG 137 12/02/2022   MPG 117 06/05/2016   Lab Results  Component Value Date   PROLACTIN 61.5 (H) 06/05/2016   PROLACTIN 18.8 (H) 06/04/2016   Lab Results  Component Value Date   CHOL 167 12/02/2022   TRIG 144 12/02/2022   HDL 47 12/02/2022   CHOLHDL 3.6 12/02/2022   VLDL 29 12/02/2022   LDLCALC 91 12/02/2022   LDLCALC 96 06/05/2016    See  Psychiatric Specialty Exam and Suicide Risk Assessment completed by Attending Physician prior to discharge.  Discharge destination:  Home  Is patient on multiple antipsychotic therapies at discharge:  No   Has Patient had three or more failed trials of antipsychotic monotherapy by history:  No  Recommended Plan for Multiple Antipsychotic Therapies: NA  Discharge Instructions     Diet - low sodium heart healthy   Complete by: As directed    Increase activity slowly   Complete by: As directed       Allergies as of 12/08/2022       Reactions   Penicillins Anaphylaxis, Hives, Other (See Comments)   Has patient had a PCN reaction causing immediate rash, facial/tongue/throat swelling, SOB or lightheadedness with hypotension: Yes Has patient had a PCN reaction causing severe rash involving mucus membranes or skin necrosis: No Has patient had a PCN reaction that required hospitalization No Has patient had a PCN reaction occurring within the last 10 years: Yes If all of the above answers are "NO", then may proceed with Cephalosporin use. Other reaction(s): UNKNOWN Has patient had a PCN reaction causing immediate rash, facial/tongue/throat swelling, SOB or  lightheadedness with hypotension: Yes Has patient had a PCN reaction causing severe rash involving mucus membranes or skin necrosis: No Has patient had a PCN reaction that required hospitalization No Has patient had a PCN reaction occurring within the last 10 years: Yes If all of the above answers are "NO", then may proceed with Cephalosporin use.   Sulfa Antibiotics Itching        Medication List     STOP taking these medications    doxycycline 100 MG tablet Commonly known as: VIBRA-TABS       TAKE these medications      Indication  albuterol 108 (90 Base) MCG/ACT inhaler Commonly known as: VENTOLIN HFA Inhale 2 puffs into the lungs every 6 (six) hours as needed for wheezing or shortness of breath.  Indication: Asthma   Anoro Ellipta 62.5-25 MCG/ACT Aepb Generic drug: umeclidinium-vilanterol Inhale 1 puff into the lungs daily.  Indication: Chronic Bronchitis   atorvastatin 80 MG tablet Commonly known as: LIPITOR Take 80 mg by mouth at bedtime.  Indication: High Amount of Fats in the Blood   Delstrigo 100-300-300 MG Tabs per tablet Generic drug: doravirin-lamivudin-tenofov df Take 1 tablet by mouth daily.  Indication: HIV Disease   FLUoxetine 20 MG capsule Commonly known as: PROZAC Take 1 capsule (20 mg total) by mouth daily. Start taking on: December 09, 2022  Indication: Depression   levothyroxine 50 MCG tablet Commonly known as: SYNTHROID Take 1 tablet (50 mcg total) by mouth daily at 6 (six) AM. Start taking on: December 09, 2022  Indication: Underactive Thyroid   metFORMIN 500 MG 24 hr tablet Commonly known as: GLUCOPHAGE-XR Take 1 tablet (500 mg total) by mouth daily with breakfast. Start taking on: December 09, 2022  Indication: Type 2 Diabetes   nicotine polacrilex 4 MG gum Commonly known as: NICORETTE Take 1 each (4 mg total) by mouth as needed for smoking cessation.  Indication: Nicotine Addiction   omeprazole 20 MG capsule Commonly known as:  PRILOSEC Take 20 mg by mouth daily.  Indication: Heartburn   Plavix 75 MG tablet Generic drug: clopidogrel Take 75 mg by mouth daily.  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  Indication: Trouble Sleeping        Follow-up Information     [employer] [facility] on 12/11/2022.   Why: You have a hospital follow up appointment on 12/11/22 at 10:00 am.  This appointment will be held in person.  Following this appointment, you will be scheduled for a clinical assessment, to obtain therapy and medication management services. Contact information: [address] [place] [address] [phone]                 Follow-up recommendations:  Activity:  As tolerated  Comments: During this hospitalization the patient did focus on his cocaine dependence and reported that he wanted to get some help.  He made several attempts to contact some rehab facilities and a lot of the applications are pending at this time.  In the meantime we did contact his mother with whom he stays and helps her.  After confirming that he can return to his mother, he was discharged with a safety plan, outpatient follow-up and the recommendation for rehab.  Patient reports that he is very motivated to get help for his addiction.  Prognosis is fair to guarded and depends on compliance with the plan.  Signed: Rex Kras, MD 12/08/2022, 9:08 AM  Total Time Spent in Direct Patient Care:  I personally spent 30 minutes on the unit in direct patient care. The direct patient care time included face-to-face time with the patient, reviewing the patient's chart, communicating with other professionals, and coordinating care. Greater than 50% of this time was spent in counseling or coordinating care with the patient regarding goals of hospitalization,  psycho-education, and discharge planning needs.   Rulon Eisenmenger [facility] Psychiatrist

## 2022-12-08 NOTE — Progress Notes (Signed)
D: Pt alert and oriented. Pt rates depression 0/10 and anxiety 0/10.  Pt denies experiencing any SI/HI, or AVH at this time.   A: Scheduled medications administered to pt, per MD orders. Support and encouragement provided. Frequent verbal contact made. Routine safety checks conducted q15 minutes.   R: No adverse drug reactions noted. Pt verbally contracts for safety at this time. Pt complaint with medications and treatment plan. Pt interacts well with others on the unit. Pt remains safe at this time. Will continue to monitor.  12/08/22 0100  Psych Admission Type (Psych Patients Only)  Admission Status Voluntary  Psychosocial Assessment  Patient Complaints Other (Comment)  Eye Contact Fair  Facial Expression Other (Comment)  Affect Appropriate to circumstance  Speech Logical/coherent  Interaction Minimal  Motor Activity Slow  Appearance/Hygiene Unremarkable  Behavior Characteristics Cooperative  Mood Pleasant  Thought Process  Coherency WDL  Content WDL  Delusions None reported or observed  Perception Hallucinations (Hears mumbling voice)  Hallucination Auditory  Judgment Limited  Confusion None  Danger to Self  Current suicidal ideation? Denies  Agreement Not to Harm Self Yes  Description of Agreement verbal  Danger to Others  Danger to Others None reported or observed

## 2022-12-08 NOTE — Progress Notes (Signed)
  [facility] Adult Case Management Discharge Plan :  Will you be returning to the same living situation after discharge:  Yes,  Patient will return to home of mother At discharge, do you have transportation home?: Yes,  daughter will provide patient home Do you have the ability to pay for your medications: Yes,  patient has insurance to cover cost of medications  Release of information consent forms completed and in the chart;  Patient's signature needed at discharge.  Patient to Follow up at:  Follow-up Information     [facility] on 12/11/2022.   Why: You have a hospital follow up appointment on 12/11/22 at 10:00 am.  This appointment will be held in person.  Following this appointment, you will be scheduled for a clinical assessment, to obtain therapy and medication management services. Contact information: [address] [place] [address] [phone]                 Next level of care provider has access to [facility] Link:no  Safety Planning and Suicide Prevention discussed: Yes,  completed with daughter and mother     Has patient been referred to the Quitline?: Patient refused referral for treatment  Patient has been referred for addiction treatment: Patient refused referral for treatment.  [address], [place], [place] 12/08/2022, 9:52 AM

## 2022-12-08 NOTE — Plan of Care (Signed)
  Problem: Education: Goal: Ability to make informed decisions regarding treatment will improve Outcome: Completed/Met   Problem: Coping: Goal: Coping ability will improve Outcome: Completed/Met   Problem: Medication: Goal: Compliance with prescribed medication regimen will improve Outcome: Completed/Met   Problem: Self-Concept: Goal: Ability to disclose and discuss suicidal ideas will improve Outcome: Completed/Met Goal: Will verbalize positive feelings about self Outcome: Completed/Met

## 2022-12-08 NOTE — Progress Notes (Signed)
Pt discharge this morning. Pt removed all belongings. Pt verbalized understanding of medications and discharge instructions.Pt denies SI/HI. Pt left facility with family.

## 2023-02-23 ENCOUNTER — Emergency Department: Payer: MEDICAID

## 2023-02-23 ENCOUNTER — Emergency Department
Admission: EM | Admit: 2023-02-23 | Discharge: 2023-02-23 | Disposition: A | Discharge status: Home / Self Care | Payer: MEDICAID | Attending: Emergency Medicine | Admitting: Emergency Medicine

## 2023-02-23 ENCOUNTER — Other Ambulatory Visit: Payer: Self-pay

## 2023-02-23 DIAGNOSIS — E876 Hypokalemia: Secondary | ICD-10-CM | POA: Insufficient documentation

## 2023-02-23 DIAGNOSIS — G43009 Migraine without aura, not intractable, without status migrainosus: Secondary | ICD-10-CM | POA: Diagnosis not present

## 2023-02-23 DIAGNOSIS — R519 Headache, unspecified: Secondary | ICD-10-CM | POA: Diagnosis present

## 2023-02-23 LAB — URINALYSIS, ROUTINE W REFLEX MICROSCOPIC
Bilirubin Urine: NEGATIVE
Glucose, UA: NEGATIVE mg/dL
Ketones, ur: NEGATIVE mg/dL
Leukocytes,Ua: NEGATIVE
Nitrite: NEGATIVE
Protein, ur: NEGATIVE mg/dL
Specific Gravity, Urine: 1.01 (ref 1.005–1.030)
Squamous Epithelial / HPF: NONE SEEN /HPF (ref 0–5)
pH: 6 (ref 5.0–8.0)

## 2023-02-23 LAB — CBC
HCT: 44.8 % (ref 39.0–52.0)
Hemoglobin: 15.3 g/dL (ref 13.0–17.0)
MCH: 31.6 pg (ref 26.0–34.0)
MCHC: 34.2 g/dL (ref 30.0–36.0)
MCV: 92.6 fL (ref 80.0–100.0)
Platelets: 320 10*3/uL (ref 150–400)
RBC: 4.84 MIL/uL (ref 4.22–5.81)
RDW: 15.2 % (ref 11.5–15.5)
WBC: 5.4 10*3/uL (ref 4.0–10.5)
nRBC: 0 % (ref 0.0–0.2)

## 2023-02-23 LAB — COMPREHENSIVE METABOLIC PANEL
ALT: 24 U/L (ref 0–44)
AST: 25 U/L (ref 15–41)
Albumin: 3.6 g/dL (ref 3.5–5.0)
Alkaline Phosphatase: 49 U/L (ref 38–126)
Anion gap: 10 (ref 5–15)
BUN: 13 mg/dL (ref 8–23)
CO2: 19 mmol/L — ABNORMAL LOW (ref 22–32)
Calcium: 9.5 mg/dL (ref 8.9–10.3)
Chloride: 109 mmol/L (ref 98–111)
Creatinine, Ser: 0.94 mg/dL (ref 0.61–1.24)
GFR, Estimated: >60 mL/min (ref >60)
Glucose, Bld: 85 mg/dL (ref 70–99)
Potassium: 3.2 mmol/L — ABNORMAL LOW (ref 3.5–5.1)
Sodium: 138 mmol/L (ref 135–145)
Total Bilirubin: 0.6 mg/dL (ref 0.3–1.2)
Total Protein: 7.6 g/dL (ref 6.5–8.1)

## 2023-02-23 LAB — LIPASE, BLOOD: Lipase: 42 U/L (ref 11–51)

## 2023-02-23 MED ORDER — KETOROLAC TROMETHAMINE 15 MG/ML IJ SOLN
15.0000 mg | Freq: Once | INTRAMUSCULAR | Status: AC
  Administered 2023-02-23: 15 mg via INTRAVENOUS
  Filled 2023-02-23: qty 1

## 2023-02-23 MED ORDER — MORPHINE SULFATE (PF) 4 MG/ML IV SOLN
4.0000 mg | Freq: Once | INTRAVENOUS | Status: AC
  Administered 2023-02-23: 4 mg via INTRAVENOUS
  Filled 2023-02-23: qty 1

## 2023-02-23 MED ORDER — POTASSIUM CHLORIDE CRYS ER 20 MEQ PO TBCR
20.0000 meq | EXTENDED_RELEASE_TABLET | Freq: Once | ORAL | Status: AC
  Administered 2023-02-23: 20 meq via ORAL
  Filled 2023-02-23 (×2): qty 1

## 2023-02-23 MED ORDER — DIPHENHYDRAMINE HCL 50 MG/ML IJ SOLN
25.0000 mg | Freq: Once | INTRAMUSCULAR | Status: AC
  Administered 2023-02-23: 25 mg via INTRAVENOUS
  Filled 2023-02-23: qty 1

## 2023-02-23 MED ORDER — SODIUM CHLORIDE 0.9 % IV BOLUS
1000.0000 mL | Freq: Once | INTRAVENOUS | Status: AC
  Administered 2023-02-23: 1000 mL via INTRAVENOUS

## 2023-02-23 MED ORDER — ONDANSETRON HCL 4 MG/2ML IJ SOLN
4.0000 mg | Freq: Once | INTRAMUSCULAR | Status: AC
  Administered 2023-02-23: 4 mg via INTRAVENOUS
  Filled 2023-02-23: qty 2

## 2023-02-23 NOTE — ED Triage Notes (Signed)
Pt to ED via POV from home. Pt reports HA and N/V since yesterday. Pt also reports rash on his abdomen.

## 2023-02-23 NOTE — ED Notes (Signed)
Pt now c/o nausea and blurred vision. Pt pain was documented at 9/10 and no pain meds were ordered. Pain still 9/10. Provider updated and awaiting orders.

## 2023-02-23 NOTE — ED Provider Notes (Signed)
[place] EMERGENCY DEPARTMENT AT [facility] Provider Note   CSN: [id number] Arrival date & time: 02/23/23  1756     History  Chief Complaint  Patient presents with   Headache   Abdominal Pain    Charles Charles is a 62 y.o. male.  Presents to the emergency department for evaluation of a headache that began this morning.  Patient describes a aching pain that was present upon awakening and throughout the day intensity of headache is increased.  He has had similar headaches in the past but this 1 was much more severe and is not getting any better.  He has had some associated nausea.  Denies any chest pain or shortness of breath.  He denies any abdominal pain but has had some hives on his abdomen that he describes as pruritic.  Denies any difficulty breathing, swallowing or any other allergic reaction symptoms.  He denies any vision changes, weakness in the upper or lower extremities.  He is not on blood thinners.  He has not any medications for his headache pain.  HPI     Home Medications Prior to Admission medications   Medication Sig Start Date End Date Taking? Authorizing Provider  albuterol (VENTOLIN HFA) 108 (90 Base) MCG/ACT inhaler Inhale 2 puffs into the lungs every 6 (six) hours as needed for wheezing or shortness of breath. 11/21/22 11/21/23  [provider]  ANORO ELLIPTA 62.5-25 MCG/ACT AEPB Inhale 1 puff into the lungs daily. 11/21/22   [provider]  atorvastatin (LIPITOR) 80 MG tablet Take 80 mg by mouth at bedtime.    [provider]  DELSTRIGO 100-300-300 MG TABS per tablet Take 1 tablet by mouth daily. 08/29/22   [provider]  FLUoxetine (PROZAC) 20 MG capsule Take 1 capsule (20 mg total) by mouth daily. 12/09/22   Rex Kras, MD  levothyroxine (SYNTHROID) 50 MCG tablet Take 1 tablet (50 mcg total) by mouth daily at 6 (six) AM. 12/09/22   Rex Kras, MD  metFORMIN (GLUCOPHAGE-XR) 500 MG 24 hr tablet Take 1 tablet  (500 mg total) by mouth daily with breakfast. 12/09/22   Rex Kras, MD  nicotine polacrilex (NICORETTE) 4 MG gum Take 1 each (4 mg total) by mouth as needed for smoking cessation. 12/08/22   Rex Kras, MD  omeprazole (PRILOSEC) 20 MG capsule Take 20 mg by mouth daily. Patient not taking: Reported on 12/03/2022 07/12/21   [provider]  PLAVIX 75 MG tablet Take 75 mg by mouth daily. 09/29/21   [provider]  traZODone (DESYREL) 50 MG tablet Take 1 tablet (50 mg total) by mouth at bedtime as needed for sleep. 12/08/22   Rex Kras, MD      Allergies    Penicillins and Sulfa antibiotics    Review of Systems   Review of Systems  Physical Exam Updated Vital Signs BP (!) 120/99   Pulse 88   Temp 98 F (36.7 C) (Oral)   Resp 18   SpO2 98%  Physical Exam Constitutional:      Appearance: He is well-developed.  HENT:     Head: Normocephalic and atraumatic.  Eyes:     Extraocular Movements:     Right eye: Normal extraocular motion.     Left eye: Normal extraocular motion.     Conjunctiva/sclera: Conjunctivae normal.  Cardiovascular:     Rate and Rhythm: Normal rate and regular rhythm.     Heart sounds: Normal heart sounds.  Pulmonary:  Effort: Pulmonary effort is normal. No respiratory distress.  Abdominal:     General: Bowel sounds are normal. There is no distension.     Palpations: Abdomen is soft.     Tenderness: There is no abdominal tenderness. There is no guarding.  Musculoskeletal:        General: Normal range of motion.     Cervical back: Normal range of motion.  Skin:    General: Skin is warm.     Findings: No rash.     Comments: No noted rash along the abdomen  Neurological:     Mental Status: He is alert and oriented to person, place, and time.     Cranial Nerves: No cranial nerve deficit or facial asymmetry.     Sensory: No sensory deficit.     Motor: No weakness.     Coordination: Romberg sign negative. Coordination normal.      Gait: Gait normal.     Deep Tendon Reflexes: Reflexes normal.  Psychiatric:        Mood and Affect: Mood normal. Mood is not anxious.        Speech: Speech normal.        Behavior: Behavior normal.        Thought Content: Thought content normal.     ED Results / Procedures / Treatments   Labs (all labs ordered are listed, but only abnormal results are displayed) Labs Reviewed  COMPREHENSIVE METABOLIC PANEL - Abnormal; Notable for the following components:      Result Value   Potassium 3.2 (*)    CO2 19 (*)    All other components within normal limits  URINALYSIS, ROUTINE W REFLEX MICROSCOPIC - Abnormal; Notable for the following components:   Color, Urine STRAW (*)    APPearance CLEAR (*)    Hgb urine dipstick SMALL (*)    Bacteria, UA RARE (*)    All other components within normal limits  LIPASE, BLOOD  CBC    EKG None  Radiology CT Head Wo Contrast  Result Date: 02/23/2023 CLINICAL DATA:  Headache with nausea and vomiting. EXAM: CT HEAD WITHOUT CONTRAST TECHNIQUE: Contiguous axial images were obtained from the base of the skull through the vertex without intravenous contrast. RADIATION DOSE REDUCTION: This exam was performed according to the departmental dose-optimization program which includes automated exposure control, adjustment of the mA and/or kV according to patient size and/or use of iterative reconstruction technique. COMPARISON:  January 05, 2022 FINDINGS: Brain: There is mild cerebral atrophy with widening of the extra-axial spaces and ventricular dilatation. There are areas of decreased attenuation within the white matter tracts of the supratentorial brain, consistent with microvascular disease changes. A small, chronic left anterior parietal lobe infarct is seen. Vascular: No hyperdense vessel or unexpected calcification. Skull: Normal. Negative for fracture or focal lesion. Sinuses/Orbits: There is mild bilateral ethmoid sinus mucosal thickening. Other: None.  IMPRESSION: 1. Generalized cerebral atrophy with chronic white matter small vessel ischemic changes. 2. Small, chronic left anterior parietal lobe infarct. 3. No acute intracranial abnormality. Electronically Signed   By: Aram Candela M.D.   On: 02/23/2023 20:04    Procedures Procedures    Medications Ordered in ED Medications  potassium chloride SA (KLOR-CON M) CR tablet 20 mEq (20 mEq Oral Given 02/23/23 2040)  diphenhydrAMINE (BENADRYL) injection 25 mg (25 mg Intravenous Given 02/23/23 1942)  sodium chloride 0.9 % bolus 1,000 mL (1,000 mLs Intravenous New Bag/Given 02/23/23 1942)  morphine (PF) 4 MG/ML injection 4 mg (4  mg Intravenous Given 02/23/23 2039)  ondansetron [facility]) injection 4 mg (4 mg Intravenous Given 02/23/23 2039)  ketorolac (TORADOL) 15 MG/ML injection 15 mg (15 mg Intravenous Given 02/23/23 2100)    ED Course/ Medical Decision Making/ A&P                                 Medical Decision Making Amount and/or Complexity of Data Reviewed Labs: ordered. Radiology: ordered.  Risk Prescription drug management.   63 year old male presents with headache.  Headache consistent with his normal migraine headache but more severe than previous headaches.  He was given IV fluids, Toradol, Benadryl and symptoms improved significantly.  Patient also noted to have a slightly low potassium of 3.3.  He was given oral potassium supplement here in the ED.  The remainder of his labs appeared well, no elevated white count.  Patient had no neurological deficits on exam and appeared well in no distress.  Symptoms significantly improved with medications.  Patient stable and ready for discharge to home.  Triage note mentions abdominal pain but patient denies any abdominal discomfort and he had no abdominal tenderness on exam.  He mentioned a slight pruritic rash along his abdomen which resolved with Benadryl. Final Clinical Impression(s) / ED Diagnoses Final diagnoses:  Migraine without  aura and without status migrainosus, not intractable  Hypokalemia    Rx / DC Orders ED Discharge Orders     None         Ronnette Juniper 02/23/23 2148    Jene Every, MD 02/26/23 [phone]

## 2023-02-23 NOTE — Discharge Instructions (Signed)
Please continue with Tylenol 1000 mg every 6 hours as needed for headache.  Make sure you are drinking lots of fluids and staying hydrated.  Your potassium levels were slightly low today, make sure you are eating a diet rich in potassium.  Please return to the ER for any fevers, chills, headaches, worsening symptoms or urgent changes in your health

## 2023-02-23 NOTE — ED Notes (Signed)
ED Provider at bedside. 

## 2023-11-10 ENCOUNTER — Emergency Department: Payer: MEDICAID

## 2023-11-10 ENCOUNTER — Other Ambulatory Visit: Payer: Self-pay

## 2023-11-10 ENCOUNTER — Emergency Department
Admission: EM | Admit: 2023-11-10 | Discharge: 2023-11-11 | Disposition: A | Discharge status: Other Acute Inpatient Hospital | Payer: MEDICAID | Attending: Emergency Medicine | Admitting: Emergency Medicine

## 2023-11-10 DIAGNOSIS — Z21 Asymptomatic human immunodeficiency virus [HIV] infection status: Secondary | ICD-10-CM | POA: Insufficient documentation

## 2023-11-10 DIAGNOSIS — F1010 Alcohol abuse, uncomplicated: Secondary | ICD-10-CM | POA: Insufficient documentation

## 2023-11-10 DIAGNOSIS — Y92522 Railway station as the place of occurrence of the external cause: Secondary | ICD-10-CM | POA: Diagnosis not present

## 2023-11-10 DIAGNOSIS — R45851 Suicidal ideations: Secondary | ICD-10-CM | POA: Diagnosis not present

## 2023-11-10 DIAGNOSIS — R519 Headache, unspecified: Secondary | ICD-10-CM

## 2023-11-10 DIAGNOSIS — K029 Dental caries, unspecified: Secondary | ICD-10-CM

## 2023-11-10 DIAGNOSIS — F322 Major depressive disorder, single episode, severe without psychotic features: Secondary | ICD-10-CM | POA: Insufficient documentation

## 2023-11-10 DIAGNOSIS — S0003XA Contusion of scalp, initial encounter: Secondary | ICD-10-CM | POA: Diagnosis not present

## 2023-11-10 DIAGNOSIS — F332 Major depressive disorder, recurrent severe without psychotic features: Secondary | ICD-10-CM | POA: Diagnosis not present

## 2023-11-10 DIAGNOSIS — F1090 Alcohol use, unspecified, uncomplicated: Secondary | ICD-10-CM

## 2023-11-10 DIAGNOSIS — R6884 Jaw pain: Secondary | ICD-10-CM | POA: Insufficient documentation

## 2023-11-10 LAB — COMPREHENSIVE METABOLIC PANEL WITH GFR
ALT: 25 U/L (ref 0–44)
AST: 27 U/L (ref 15–41)
Albumin: 3.4 g/dL — ABNORMAL LOW (ref 3.5–5.0)
Alkaline Phosphatase: 53 U/L (ref 38–126)
Anion gap: 11 (ref 5–15)
BUN: 12 mg/dL (ref 8–23)
CO2: 26 mmol/L (ref 22–32)
Calcium: 8.8 mg/dL — ABNORMAL LOW (ref 8.9–10.3)
Chloride: 110 mmol/L (ref 98–111)
Creatinine, Ser: 0.78 mg/dL (ref 0.61–1.24)
GFR, Estimated: >60 mL/min (ref >60)
Glucose, Bld: 96 mg/dL (ref 70–99)
Potassium: 3.7 mmol/L (ref 3.5–5.1)
Sodium: 147 mmol/L — ABNORMAL HIGH (ref 135–145)
Total Bilirubin: 0.6 mg/dL (ref 0.0–1.2)
Total Protein: 7.2 g/dL (ref 6.5–8.1)

## 2023-11-10 LAB — CBC
HCT: 46.8 % (ref 39.0–52.0)
Hemoglobin: 15.9 g/dL (ref 13.0–17.0)
MCH: 32.1 pg (ref 26.0–34.0)
MCHC: 34 g/dL (ref 30.0–36.0)
MCV: 94.4 fL (ref 80.0–100.0)
Platelets: 325 10*3/uL (ref 150–400)
RBC: 4.96 MIL/uL (ref 4.22–5.81)
RDW: 13.7 % (ref 11.5–15.5)
WBC: 5.7 10*3/uL (ref 4.0–10.5)
nRBC: 0 % (ref 0.0–0.2)

## 2023-11-10 LAB — URINE DRUG SCREEN, QUALITATIVE (ARMC ONLY)
Amphetamines, Ur Screen: NOT DETECTED
Barbiturates, Ur Screen: NOT DETECTED
Benzodiazepine, Ur Scrn: NOT DETECTED
Cannabinoid 50 Ng, Ur ~~LOC~~: POSITIVE — AB
Cocaine Metabolite,Ur ~~LOC~~: POSITIVE — AB
MDMA (Ecstasy)Ur Screen: NOT DETECTED
Methadone Scn, Ur: NOT DETECTED
Opiate, Ur Screen: NOT DETECTED
Phencyclidine (PCP) Ur S: NOT DETECTED
Tricyclic, Ur Screen: NOT DETECTED

## 2023-11-10 LAB — SALICYLATE LEVEL: Salicylate Lvl: <7 mg/dL — ABNORMAL LOW (ref 7.0–30.0)

## 2023-11-10 LAB — ACETAMINOPHEN LEVEL: Acetaminophen (Tylenol), Serum: <10 ug/mL — ABNORMAL LOW (ref 10–30)

## 2023-11-10 LAB — ETHANOL: Alcohol, Ethyl (B): 185 mg/dL — ABNORMAL HIGH (ref <15)

## 2023-11-10 MED ORDER — OLANZAPINE 5 MG PO TABS
5.0000 mg | ORAL_TABLET | Freq: Once | ORAL | Status: AC
  Administered 2023-11-10: 5 mg via ORAL
  Filled 2023-11-10: qty 1

## 2023-11-10 MED ORDER — ACETAMINOPHEN 500 MG PO TABS
1000.0000 mg | ORAL_TABLET | Freq: Once | ORAL | Status: AC
  Administered 2023-11-10: 1000 mg via ORAL
  Filled 2023-11-10: qty 2
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
## 2023-11-10 NOTE — ED Notes (Signed)
 Pt sleeping at time for vitals.  Pt did not want to wake up.  WIll obtain vitals when he awakens

## 2023-11-10 NOTE — ED Notes (Signed)
 Pt mother in dayroom with patient relations and security for visit.

## 2023-11-10 NOTE — ED Notes (Signed)
 TTS with pt in dayroom for evaluation; pt appears calm and cooperative at this time .

## 2023-11-10 NOTE — ED Notes (Signed)
 Dinner tray provided for pt
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
## 2023-11-10 NOTE — ED Notes (Signed)
 Spoke with pt mom on the phone after she called asking for update. Pt agreed to allow mom to get information regarding pt care and status.
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
## 2023-11-10 NOTE — ED Notes (Signed)
 Lunch and juice provided for pt in exam room; pt had been resting with eyes closed and even respirations. Easily awoken.
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
## 2023-11-10 NOTE — ED Notes (Signed)
 NP at the bedside for pt evaluation

## 2023-11-10 NOTE — ED Notes (Addendum)
 Sandwich tray provided by staff with ice water  in day room

## 2023-11-10 NOTE — BH Assessment (Signed)
 TTS attempted to assess pt. Per RN, pt was too somnolent to engage in assessment. TTS will be notified once pt is awake and alert for assessment.

## 2023-11-10 NOTE — ED Notes (Signed)
 Snack and water  placed by pts bed.  Pt still asleep

## 2023-11-10 NOTE — BH Assessment (Signed)
 TTS attempted to assess pt. Pt was too somnolent to engage in assessment. TTS will be notified once pt is awake and alert for assessment.

## 2023-11-11 ENCOUNTER — Inpatient Hospital Stay
Admission: AD | Admit: 2023-11-11 | Discharge: 2023-11-18 | DRG: 885 | Disposition: A | Discharge status: Home / Self Care | Payer: MEDICAID | Source: Intra-hospital | Attending: Psychiatry | Admitting: Psychiatry

## 2023-11-11 ENCOUNTER — Encounter: Payer: Self-pay | Admitting: Psychiatry

## 2023-11-11 DIAGNOSIS — T43596A Underdosing of other antipsychotics and neuroleptics, initial encounter: Secondary | ICD-10-CM | POA: Diagnosis present

## 2023-11-11 DIAGNOSIS — Z880 Allergy status to penicillin: Secondary | ICD-10-CM

## 2023-11-11 DIAGNOSIS — F419 Anxiety disorder, unspecified: Secondary | ICD-10-CM | POA: Diagnosis present

## 2023-11-11 DIAGNOSIS — Z91199 Patient's noncompliance with other medical treatment and regimen due to unspecified reason: Secondary | ICD-10-CM

## 2023-11-11 DIAGNOSIS — Z9151 Personal history of suicidal behavior: Secondary | ICD-10-CM | POA: Diagnosis not present

## 2023-11-11 DIAGNOSIS — F1290 Cannabis use, unspecified, uncomplicated: Secondary | ICD-10-CM | POA: Diagnosis present

## 2023-11-11 DIAGNOSIS — R45850 Homicidal ideations: Secondary | ICD-10-CM | POA: Diagnosis present

## 2023-11-11 DIAGNOSIS — Z9152 Personal history of nonsuicidal self-harm: Secondary | ICD-10-CM

## 2023-11-11 DIAGNOSIS — F203 Undifferentiated schizophrenia: Secondary | ICD-10-CM | POA: Diagnosis present

## 2023-11-11 DIAGNOSIS — F1090 Alcohol use, unspecified, uncomplicated: Secondary | ICD-10-CM | POA: Diagnosis present

## 2023-11-11 DIAGNOSIS — F17210 Nicotine dependence, cigarettes, uncomplicated: Secondary | ICD-10-CM | POA: Diagnosis present

## 2023-11-11 DIAGNOSIS — Z833 Family history of diabetes mellitus: Secondary | ICD-10-CM | POA: Diagnosis not present

## 2023-11-11 DIAGNOSIS — Z21 Asymptomatic human immunodeficiency virus [HIV] infection status: Secondary | ICD-10-CM | POA: Diagnosis present

## 2023-11-11 DIAGNOSIS — F332 Major depressive disorder, recurrent severe without psychotic features: Principal | ICD-10-CM | POA: Diagnosis present

## 2023-11-11 DIAGNOSIS — Z638 Other specified problems related to primary support group: Secondary | ICD-10-CM | POA: Diagnosis not present

## 2023-11-11 DIAGNOSIS — Z5982 Transportation insecurity: Secondary | ICD-10-CM | POA: Diagnosis not present

## 2023-11-11 DIAGNOSIS — F333 Major depressive disorder, recurrent, severe with psychotic symptoms: Secondary | ICD-10-CM | POA: Diagnosis not present

## 2023-11-11 DIAGNOSIS — F1490 Cocaine use, unspecified, uncomplicated: Secondary | ICD-10-CM | POA: Diagnosis present

## 2023-11-11 DIAGNOSIS — R45851 Suicidal ideations: Secondary | ICD-10-CM | POA: Diagnosis present

## 2023-11-11 DIAGNOSIS — Z8673 Personal history of transient ischemic attack (TIA), and cerebral infarction without residual deficits: Secondary | ICD-10-CM

## 2023-11-11 DIAGNOSIS — Z87892 Personal history of anaphylaxis: Secondary | ICD-10-CM | POA: Diagnosis not present

## 2023-11-11 DIAGNOSIS — B20 Human immunodeficiency virus [HIV] disease: Secondary | ICD-10-CM | POA: Diagnosis not present

## 2023-11-11 DIAGNOSIS — Z62820 Parent-biological child conflict: Secondary | ICD-10-CM

## 2023-11-11 DIAGNOSIS — Z8619 Personal history of other infectious and parasitic diseases: Secondary | ICD-10-CM

## 2023-11-11 DIAGNOSIS — Z818 Family history of other mental and behavioral disorders: Secondary | ICD-10-CM | POA: Diagnosis not present

## 2023-11-11 DIAGNOSIS — Z79899 Other long term (current) drug therapy: Secondary | ICD-10-CM

## 2023-11-11 DIAGNOSIS — J449 Chronic obstructive pulmonary disease, unspecified: Secondary | ICD-10-CM | POA: Diagnosis present

## 2023-11-11 DIAGNOSIS — Z5986 Financial insecurity: Secondary | ICD-10-CM | POA: Diagnosis not present

## 2023-11-11 LAB — HIV-1 RNA QUANT-NO REFLEX-BLD
HIV 1 RNA Quant: <20 {copies}/mL
LOG10 HIV-1 RNA: UNDETERMINED {Log_copies}/mL

## 2023-11-11 MED ORDER — CHLORDIAZEPOXIDE HCL 25 MG PO CAPS: 25.0000 mg | ORAL_CAPSULE | Freq: Four times a day (QID) | ORAL | Status: DC | PRN

## 2023-11-11 MED ORDER — ALUM & MAG HYDROXIDE-SIMETH 200-200-20 MG/5ML PO SUSP: 15.0000 mL | Freq: Four times a day (QID) | ORAL | Status: DC | PRN

## 2023-11-11 MED ORDER — DIPHENHYDRAMINE HCL 50 MG/ML IJ SOLN: 50.0000 mg | Freq: Three times a day (TID) | INTRAMUSCULAR | Status: DC | PRN

## 2023-11-11 MED ORDER — OLANZAPINE 5 MG PO TABS
5.0000 mg | ORAL_TABLET | Freq: Every day | ORAL | Status: DC
  Administered 2023-11-11 – 2023-11-12 (×2): 5 mg via ORAL
  Filled 2023-11-11 (×2): qty 1

## 2023-11-11 MED ORDER — ACETAMINOPHEN 325 MG PO TABS
650.0000 mg | ORAL_TABLET | Freq: Four times a day (QID) | ORAL | Status: DC | PRN
  Administered 2023-11-11 – 2023-11-17 (×6): 650 mg via ORAL
  Filled 2023-11-11 (×6): qty 2

## 2023-11-11 MED ORDER — LORAZEPAM 2 MG/ML IJ SOLN: 2.0000 mg | Freq: Three times a day (TID) | INTRAMUSCULAR | Status: DC | PRN

## 2023-11-11 MED ORDER — MAGNESIUM HYDROXIDE 400 MG/5ML PO SUSP: 15.0000 mL | Freq: Every day | ORAL | Status: DC | PRN

## 2023-11-11 MED ORDER — HALOPERIDOL LACTATE 5 MG/ML IJ SOLN: 5.0000 mg | Freq: Three times a day (TID) | INTRAMUSCULAR | Status: DC | PRN

## 2023-11-11 MED ORDER — DIPHENHYDRAMINE HCL 25 MG PO CAPS: 50.0000 mg | ORAL_CAPSULE | Freq: Three times a day (TID) | ORAL | Status: DC | PRN

## 2023-11-11 MED ORDER — HALOPERIDOL LACTATE 5 MG/ML IJ SOLN: 10.0000 mg | Freq: Three times a day (TID) | INTRAMUSCULAR | Status: DC | PRN

## 2023-11-11 MED ORDER — HALOPERIDOL 5 MG PO TABS: 5.0000 mg | ORAL_TABLET | Freq: Three times a day (TID) | ORAL | Status: DC | PRN

## 2023-11-11 NOTE — ED Notes (Signed)
 Report given to [facility] in the [facility]U. Patient aware of trransfer and agreeable with plan of care.
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
## 2023-11-11 NOTE — Group Note (Unsigned)
 Date:  11/11/2023 Time:  8:49 PM  Group Topic/Focus:  Wrap-Up Group:   The focus of this group is to help patients review their daily goal of treatment and discuss progress on daily workbooks.     Participation Level:  {BHH PARTICIPATION ZOXWR:60454}  Participation Quality:  {BHH PARTICIPATION QUALITY:22265}  Affect:  {BHH AFFECT:22266}  Cognitive:  {BHH COGNITIVE:22267}  Insight: {BHH Insight2:20797}  Engagement in Group:  {BHH ENGAGEMENT IN UJWJX:91478}  Modes of Intervention:  {BHH MODES OF INTERVENTION:22269}  Additional Comments:  ***  Fabiola Holy 11/11/2023, 8:49 PM

## 2023-11-11 NOTE — Plan of Care (Signed)
   Problem: Education: Goal: Knowledge of [place] [facility] Education information/materials will improve Outcome: Progressing
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
## 2023-11-11 NOTE — Progress Notes (Signed)
   11/11/23 1200  Psych Admission Type (Psych Patients Only)  Admission Status Involuntary  Psychosocial Assessment  Patient Complaints Depression (patient stated, "Im still down"; rated depression 9/10)  Eye Contact Avoids  Facial Expression Flat  Affect Flat  Speech Logical/coherent  Interaction Assertive  Motor Activity Slow  Appearance/Hygiene In scrubs  Behavior Characteristics Cooperative;Calm;Appropriate to situation  Mood Depressed (denied anxiety)  Thought Process  Coherency WDL  Content WDL  Delusions WDL  Perception WDL  Hallucination Auditory ("negative talk")  Judgment UTA  Confusion WDL  Danger to Self  Current suicidal ideation? Denies  Agreement Not to Harm Self Yes  Description of Agreement verbal  Danger to Others  Danger to Others None reported or observed  Danger to Others Abnormal  Harmful Behavior to others No threats or harm toward other people  Destructive Behavior No threats or harm toward property

## 2023-11-11 NOTE — Group Note (Signed)
 Date:  11/11/2023 Time:  9:49 PM  Group Topic/Focus:  Wrap-Up Group:   The focus of this group is to help patients review their daily goal of treatment and discuss progress on daily workbooks.    Participation Level:  None  Participation Quality:  Appropriate  Affect:  Depressed  Cognitive:  Appropriate  Insight: Appropriate  Engagement in Group:  None  Modes of Intervention:  Discussion  Additional Comments:     Fabiola Holy 11/11/2023, 9:49 PM

## 2023-11-11 NOTE — Group Note (Signed)
 Date:  11/11/2023 Time:  4:34 PM  Group Topic/Focus:  Activity Group: The focus of the group is to promote activity for the patients and encourage them to go outside to the courtyard and get some fresh air and some exercise.    Participation Level:  Active  Participation Quality:  Appropriate  Affect:  Appropriate  Cognitive:  Appropriate  Insight: Appropriate  Engagement in Group:  Engaged  Modes of Intervention:  Activity  Additional Comments:    Marianna Shirk Ahlam Piscitelli 11/11/2023, 4:34 PM
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
## 2023-11-11 NOTE — Group Note (Signed)
 Recreation Therapy Group Note   Group Topic:Goal Setting  Group Date: 11/11/2023 Start Time: 1000 End Time: 1055 Facilitators: Deatrice Factor, LRT, CTRS Location: Craft Room  Group Description: Product/process development scientist. Patients were given many different magazines, a glue stick, markers, and a piece of cardstock paper. LRT and pts discussed the importance of having goals in life. LRT and pts discussed the difference between short-term and long-term goals, as well as what a SMART goal is. LRT encouraged pts to create a vision board, with images they picked and then cut out with safety scissors from the magazine, for themselves, that capture their short and long-term goals. LRT encouraged pts to show and explain their vision board to the group.   Goal Area(s) Addressed:  Patient will gain knowledge of short vs. long term goals.  Patient will identify goals for themselves. Patient will practice setting SMART goals. Patient will verbalize their goals to LRT and peers.   Affect/Mood: N/A   Participation Level: Did not attend    Clinical Observations/Individualized Feedback: Patient did not attend group.   Plan: Continue to engage patient in RT group sessions 2-3x/week.   Deatrice Factor, LRT, CTRS 11/11/2023 1:07 PM

## 2023-11-11 NOTE — Group Note (Signed)
 Date:  11/11/2023 Time:  10:09 AM  Group Topic/Focus:  Healthy Communication:   The focus of this group is to discuss communication, barriers to communication, as well as healthy ways to communicate with others.    Participation Level:  Did Not Attend   Charles Charles Charles Charles 11/11/2023, 10:09 AM

## 2023-11-11 NOTE — Progress Notes (Signed)
 Pt admitted to [facility] via [facility] ED.  On assessment pt was calm and cooperative; endorsing passive SI and HI (toward his son).  Pt was given juice to drink. Pt denied dizziness or lightheadedness.  Pt endorsed occasional CAH to hurt self and others.  Pt was oriented to unit and his room and instructed to call for assistance.  Pt slept through remainder of shift.  Monitoring for safety.    11/11/23 0200  Psychosocial Assessment  Patient Complaints Depression  Eye Contact Poor  Facial Expression Flat  Affect Flat  Speech Logical/coherent  Interaction Assertive  Motor Activity Slow  Appearance/Hygiene In scrubs  Behavior Characteristics Cooperative;Appropriate to situation;Calm  Mood Depressed;Anxious  Thought Process  Coherency WDL  Content WDL  Delusions None reported or observed  Perception WDL  Hallucination Auditory;Command  Judgment Impaired  Confusion WDL  Danger to Self  Current suicidal ideation? Passive  Agreement Not to Harm Self Yes  Description of Agreement verbal  Danger to Others  Danger to Others Reported or observed  Danger to Others Abnormal  Harmful Behavior to others Threats of violence towards other people observed or expressed   Description of Harmful Behavior "homicidal thoughts toward my son"  Destructive Behavior No threats or harm toward property

## 2023-11-11 NOTE — Group Note (Signed)
 [facility] LCSW Group Therapy Note   Group Date: 11/11/2023 Start Time: 1300 End Time: 1420   Type of Therapy/Topic:  Group Therapy:  Emotion Regulation  Participation Level:  Did Not Attend   Mood:  Description of Group:    The purpose of this group is to assist patients in learning to regulate negative emotions and experience positive emotions. Patients will be guided to discuss ways in which they have been vulnerable to their negative emotions. These vulnerabilities will be juxtaposed with experiences of positive emotions or situations, and patients challenged to use positive emotions to combat negative ones. Special emphasis will be placed on coping with negative emotions in conflict situations, and patients will process healthy conflict resolution skills.  Therapeutic Goals: Patient will identify two positive emotions or experiences to reflect on in order to balance out negative emotions:  Patient will label two or more emotions that they find the most difficult to experience:  Patient will be able to demonstrate positive conflict resolution skills through discussion or role plays:   Summary of Patient Progress:   Patient did not attend group.     Therapeutic Modalities:   Cognitive Behavioral Therapy Feelings Identification Dialectical Behavioral Therapy   Roselle Conner, LCSW

## 2023-11-11 NOTE — Plan of Care (Signed)
  Problem: Education: Goal: Knowledge of [facility] Education information/materials will improve 11/11/2023 1215 by Derenda Flax, RN Outcome: Not Progressing 11/11/2023 1214 by Derenda Flax, RN Outcome: Not Progressing Goal: Emotional status will improve 11/11/2023 1215 by Derenda Flax, RN Outcome: Not Progressing 11/11/2023 1214 by Derenda Flax, RN Outcome: Not Progressing Goal: Mental status will improve 11/11/2023 1215 by Derenda Flax, RN Outcome: Not Progressing 11/11/2023 1214 by Derenda Flax, RN Outcome: Not Progressing Goal: Verbalization of understanding the information provided will improve 11/11/2023 1215 by Derenda Flax, RN Outcome: Not Progressing 11/11/2023 1214 by Derenda Flax, RN Outcome: Not Progressing

## 2023-11-11 NOTE — Progress Notes (Signed)
   11/11/23 1749  CIWA-Ar  Nausea and Vomiting 0  Tactile Disturbances 0  Tremor 0  Auditory Disturbances 0  Paroxysmal Sweats 0  Visual Disturbances 0  Anxiety 0  Headache, Fullness in Head 0  Agitation 0  Orientation and Clouding of Sensorium 0  CIWA-Ar Total 0

## 2023-11-12 LAB — HELPER T-LYMPH-CD4 (ARMC ONLY)
% CD 4 Pos. Lymph.: 68.1 % — ABNORMAL HIGH (ref 30.8–58.5)
Absolute CD 4 Helper: 1566 /uL — ABNORMAL HIGH (ref 359–1519)
Basophils Absolute: 0.1 10*3/uL (ref 0.0–0.2)
Basos: 1 %
EOS (ABSOLUTE): 0.1 10*3/uL (ref 0.0–0.4)
Eos: 2 %
Hematocrit: 49.1 % (ref 37.5–51.0)
Hemoglobin: 16.3 g/dL (ref 13.0–17.7)
Immature Grans (Abs): 0 10*3/uL (ref 0.0–0.1)
Immature Granulocytes: 1 %
Lymphocytes Absolute: 2.3 10*3/uL (ref 0.7–3.1)
Lymphs: 39 %
MCH: 32.5 pg (ref 26.6–33.0)
MCHC: 33.2 g/dL (ref 31.5–35.7)
MCV: 98 fL — ABNORMAL HIGH (ref 79–97)
Monocytes Absolute: 0.6 10*3/uL (ref 0.1–0.9)
Monocytes: 10 %
Neutrophils Absolute: 2.7 10*3/uL (ref 1.4–7.0)
Neutrophils: 47 %
Platelets: 361 10*3/uL (ref 150–450)
RBC: 5.01 x10E6/uL (ref 4.14–5.80)
RDW: 14 % (ref 11.6–15.4)
WBC: 5.8 10*3/uL (ref 3.4–10.8)

## 2023-11-12 MED ORDER — HYDROXYZINE HCL 25 MG PO TABS
25.0000 mg | ORAL_TABLET | Freq: Three times a day (TID) | ORAL | Status: DC | PRN
  Administered 2023-11-13 – 2023-11-17 (×4): 25 mg via ORAL
  Filled 2023-11-12 (×4): qty 1

## 2023-11-12 NOTE — Plan of Care (Signed)
   Problem: Education: Goal: Knowledge of [facility] Education information/materials will improve Outcome: Progressing Goal: Emotional status will improve Outcome: Progressing
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
## 2023-11-12 NOTE — Plan of Care (Signed)
   Problem: Education: Goal: Emotional status will improve Outcome: Progressing Goal: Mental status will improve Outcome: Progressing

## 2023-11-12 NOTE — BH IP Treatment Plan (Signed)
 Interdisciplinary Treatment and Diagnostic Plan Update  11/12/2023 Time of Session: 10:21AM Charles Charles MRN: [id number]  Principal Diagnosis: MDD (major depressive disorder), recurrent episode, severe (HCC)  Secondary Diagnoses: Principal Problem:   MDD (major depressive disorder), recurrent episode, severe (HCC) Active Problems:   Undifferentiated schizophrenia (HCC)   Current Medications:  Current Facility-Administered Medications  Medication Dose Route Frequency Provider Last Rate Last Admin   acetaminophen  (TYLENOL ) tablet 650 mg  650 mg Oral Q6H PRN Onuoha, Chinwendu V, NP   650 mg at 11/11/23 2104   alum & mag hydroxide-simeth (MAALOX/MYLANTA) 200-200-20 MG/5ML suspension 15 mL  15 mL Oral Q6H PRN Onuoha, Chinwendu V, NP       chlordiazePOXIDE (LIBRIUM) capsule 25 mg  25 mg Oral Q6H PRN Millington, Matthew E, PA-C       haloperidol  (HALDOL ) tablet 5 mg  5 mg Oral TID PRN Onuoha, Chinwendu V, NP       And   diphenhydrAMINE  (BENADRYL ) capsule 50 mg  50 mg Oral TID PRN Onuoha, Chinwendu V, NP       haloperidol  lactate (HALDOL ) injection 5 mg  5 mg Intramuscular TID PRN Onuoha, Chinwendu V, NP       And   diphenhydrAMINE  (BENADRYL ) injection 50 mg  50 mg Intramuscular TID PRN Onuoha, Chinwendu V, NP       And   LORazepam  (ATIVAN ) injection 2 mg  2 mg Intramuscular TID PRN Onuoha, Chinwendu V, NP       haloperidol  lactate (HALDOL ) injection 10 mg  10 mg Intramuscular TID PRN Onuoha, Chinwendu V, NP       And   diphenhydrAMINE  (BENADRYL ) injection 50 mg  50 mg Intramuscular TID PRN Onuoha, Chinwendu V, NP       And   LORazepam  (ATIVAN ) injection 2 mg  2 mg Intramuscular TID PRN Onuoha, Chinwendu V, NP       magnesium  hydroxide (MILK OF MAGNESIA) suspension 15 mL  15 mL Oral Daily PRN Onuoha, Chinwendu V, NP       OLANZapine  (ZYPREXA ) tablet 5 mg  5 mg Oral QHS Millington, Matthew E, PA-C   5 mg at 11/11/23 2105   PTA Medications: Medications Prior to Admission  Medication Sig  Dispense Refill Last Dose/Taking   albuterol  (VENTOLIN  HFA) 108 (90 Base) MCG/ACT inhaler Inhale 2 puffs into the lungs every 6 (six) hours as needed for wheezing or shortness of breath. (Patient not taking: Reported on 11/11/2023)   Not Taking   ANORO ELLIPTA  62.5-25 MCG/ACT AEPB Inhale 1 puff into the lungs daily. (Patient not taking: Reported on 11/11/2023)   Not Taking   atorvastatin  (LIPITOR ) 80 MG tablet Take 80 mg by mouth at bedtime. (Patient not taking: Reported on 11/11/2023)   Not Taking   DELSTRIGO 100-300-300 MG TABS per tablet Take 1 tablet by mouth daily. (Patient not taking: Reported on 11/11/2023)   Not Taking   FLUoxetine  (PROZAC ) 20 MG capsule Take 1 capsule (20 mg total) by mouth daily. (Patient not taking: Reported on 11/11/2023) 30 capsule 0 Not Taking   levothyroxine  (SYNTHROID ) 50 MCG tablet Take 1 tablet (50 mcg total) by mouth daily at 6 (six) AM. (Patient not taking: Reported on 11/10/2023) 30 tablet 0 Not Taking   metFORMIN  (GLUCOPHAGE -XR) 500 MG 24 hr tablet Take 1 tablet (500 mg total) by mouth daily with breakfast. (Patient not taking: Reported on 11/11/2023) 30 tablet 0 Not Taking   nicotine  polacrilex (NICORETTE ) 4 MG gum Take 1 each (4 mg total)  by mouth as needed for smoking cessation. (Patient not taking: Reported on 11/10/2023) 100 tablet 0 Not Taking   omeprazole (PRILOSEC) 20 MG capsule Take 20 mg by mouth daily. (Patient not taking: Reported on 12/03/2022)   Not Taking   PLAVIX  75 MG tablet Take 75 mg by mouth daily. (Patient not taking: Reported on 11/10/2023)   Not Taking   traZODone  (DESYREL ) 50 MG tablet Take 1 tablet (50 mg total) by mouth at bedtime as needed for sleep. (Patient not taking: Reported on 11/11/2023) 30 tablet 0 Not Taking    Patient Stressors:    Patient Strengths:    Treatment Modalities: Medication Management, Group therapy, Case management,  1 to 1 session with clinician, Psychoeducation, Recreational therapy.   Physician Treatment Plan for  Primary Diagnosis: MDD (major depressive disorder), recurrent episode, severe (HCC) Long Term Goal(s): Improvement in symptoms so as ready for discharge   Short Term Goals: Ability to identify changes in lifestyle to reduce recurrence of condition will improve Ability to demonstrate self-control will improve Ability to identify and develop effective coping behaviors will improve Compliance with prescribed medications will improve Ability to identify triggers associated with substance abuse/mental health issues will improve  Medication Management: Evaluate patient's response, side effects, and tolerance of medication regimen.  Therapeutic Interventions: 1 to 1 sessions, Unit Group sessions and Medication administration.  Evaluation of Outcomes: Progressing  Physician Treatment Plan for Secondary Diagnosis: Principal Problem:   MDD (major depressive disorder), recurrent episode, severe (HCC) Active Problems:   Undifferentiated schizophrenia (HCC)  Long Term Goal(s): Improvement in symptoms so as ready for discharge   Short Term Goals: Ability to identify changes in lifestyle to reduce recurrence of condition will improve Ability to demonstrate self-control will improve Ability to identify and develop effective coping behaviors will improve Compliance with prescribed medications will improve Ability to identify triggers associated with substance abuse/mental health issues will improve     Medication Management: Evaluate patient's response, side effects, and tolerance of medication regimen.  Therapeutic Interventions: 1 to 1 sessions, Unit Group sessions and Medication administration.  Evaluation of Outcomes: Progressing   RN Treatment Plan for Primary Diagnosis: MDD (major depressive disorder), recurrent episode, severe (HCC) Long Term Goal(s): Knowledge of disease and therapeutic regimen to maintain health will improve  Short Term Goals: Ability to demonstrate self-control,  Ability to participate in decision making will improve, Ability to verbalize feelings will improve, Ability to disclose and discuss suicidal ideas, Ability to identify and develop effective coping behaviors will improve, and Compliance with prescribed medications will improve  Medication Management: RN will administer medications as ordered by provider, will assess and evaluate patient's response and provide education to patient for prescribed medication. RN will report any adverse and/or side effects to prescribing provider.  Therapeutic Interventions: 1 on 1 counseling sessions, Psychoeducation, Medication administration, Evaluate responses to treatment, Monitor vital signs and CBGs as ordered, Perform/monitor CIWA, COWS, AIMS and Fall Risk screenings as ordered, Perform wound care treatments as ordered.  Evaluation of Outcomes: Progressing   LCSW Treatment Plan for Primary Diagnosis: MDD (major depressive disorder), recurrent episode, severe (HCC) Long Term Goal(s): Safe transition to appropriate next level of care at discharge, Engage patient in therapeutic group addressing interpersonal concerns.  Short Term Goals: Engage patient in aftercare planning with referrals and resources, Increase social support, Increase ability to appropriately verbalize feelings, Increase emotional regulation, Facilitate acceptance of mental health diagnosis and concerns, Facilitate patient progression through stages of change regarding substance use diagnoses and concerns,  and Identify triggers associated with mental health/substance abuse issues  Therapeutic Interventions: Assess for all discharge needs, 1 to 1 time with Social worker, Explore available resources and support systems, Assess for adequacy in community support network, Educate family and significant other(s) on suicide prevention, Complete Psychosocial Assessment, Interpersonal group therapy.  Evaluation of Outcomes: Not Met   Progress in  Treatment: Attending groups: Yes. Participating in groups: Yes. Taking medication as prescribed: Yes. Toleration medication: Yes. Family/Significant other contact made: No, will contact:  once permission has been granted Patient understands diagnosis: Yes. Discussing patient identified problems/goals with staff: Yes. Medical problems stabilized or resolved: Yes. Denies suicidal/homicidal ideation: Yes. Issues/concerns per patient self-inventory: No. Other: none  New problem(s) identified: No, Describe:  none  New Short Term/Long Term Goal(s): detox, elimination of symptoms of psychosis, medication management for mood stabilization; elimination of SI thoughts; development of comprehensive mental wellness/sobriety plan.   Patient Goals:  "I want to get off this crack for real so bad"  Discharge Plan or Barriers: CSW to assist with development of appropriate discharge plans.   Reason for Continuation of Hospitalization: Anxiety Depression Medication stabilization Suicidal ideation  Estimated Length of Stay:  1-7 days  Last 3 [place] Suicide Severity Risk Score: Flowsheet Row Admission (Current) from 11/11/2023 in [facility] ED from 11/10/2023 in [facility] Emergency Department at [facility] ED from 02/23/2023 in [facility] Emergency Department at [facility]  C-SSRS RISK CATEGORY Low Risk Low Risk No Risk       Last PHQ 2/9 Scores:     No data to display          Scribe for Treatment Team: Larri Ply, LCSW 11/12/2023 11:00 AM

## 2023-11-12 NOTE — Group Note (Signed)
 [facility] LCSW Group Therapy Note   Group Date: 11/12/2023 Start Time: 1300 End Time: 1415   Type of Therapy/Topic:  Group Therapy:  Emotion Regulation  Participation Level:  Did Not Attend   Mood:  Description of Group:    The purpose of this group is to assist patients in learning to regulate negative emotions and experience positive emotions. Patients will be guided to discuss ways in which they have been vulnerable to their negative emotions. These vulnerabilities will be juxtaposed with experiences of positive emotions or situations, and patients challenged to use positive emotions to combat negative ones. Special emphasis will be placed on coping with negative emotions in conflict situations, and patients will process healthy conflict resolution skills.  Therapeutic Goals: Patient will identify two positive emotions or experiences to reflect on in order to balance out negative emotions:  Patient will label two or more emotions that they find the most difficult to experience:  Patient will be able to demonstrate positive conflict resolution skills through discussion or role plays:   Summary of Patient Progress:   X    Therapeutic Modalities:   Cognitive Behavioral Therapy Feelings Identification Dialectical Behavioral Therapy   Larri Ply, LCSW

## 2023-11-12 NOTE — Progress Notes (Signed)
   11/12/23 1142 11/12/23 1144 11/12/23 1146  Vital Signs  Pulse Rate (!) 57 69 80  BP 95/63 111/65 (!) 102/59  BP Method Automatic Automatic Automatic  Patient Position (if appropriate) Lying Sitting Standing   Ortho V/S notified Zoila Hines PA

## 2023-11-12 NOTE — Group Note (Signed)
 Date:  11/12/2023 Time:  6:40 PM  Group Topic/Focus:  Making Healthy Choices:   The focus of this group is to help patients identify negative/unhealthy choices they were using prior to admission and identify positive/healthier coping strategies to replace them upon discharge.    Participation Level:  Did Not Attend   Charles Charles [facility] 11/12/2023, 6:40 PM

## 2023-11-12 NOTE — Group Note (Signed)
 Date:  11/12/2023 Time:  9:25 PM  Group Topic/Focus:  Stages of Change:   The focus of this group is to explain the stages of change and help patients identify changes they want to make upon discharge.    Participation Level:  Active  Participation Quality:  Appropriate and Attentive  Affect:  Appropriate  Cognitive:  Alert and Appropriate  Insight: Appropriate and Good  Engagement in Group:  Developing/Improving and Engaged  Modes of Intervention:  Activity, Discussion, Rapport Building, Reality Testing, and Support  Additional Comments:     Charles Charles 11/12/2023, 9:25 PM

## 2023-11-12 NOTE — Progress Notes (Signed)
   11/12/23 1100  Psych Admission Type (Psych Patients Only)  Admission Status Involuntary  Psychosocial Assessment  Patient Complaints Anxiety;Depression  Eye Contact Brief  Facial Expression Flat  Affect Depressed  Speech Logical/coherent  Interaction  (WNL)  Motor Activity Other (Comment) (WNL)  Appearance/Hygiene In scrubs  Behavior Characteristics Cooperative  Mood Depressed  Thought Process  Coherency WDL  Content WDL  Delusions None reported or observed  Perception WDL  Confusion WDL  Danger to Self  Current suicidal ideation? Denies  Agreement Not to Harm Self Yes  Description of Agreement verbal  Danger to Others  Danger to Others None reported or observed

## 2023-11-13 DIAGNOSIS — B20 Human immunodeficiency virus [HIV] disease: Secondary | ICD-10-CM

## 2023-11-13 DIAGNOSIS — F1490 Cocaine use, unspecified, uncomplicated: Secondary | ICD-10-CM

## 2023-11-13 MED ORDER — OLANZAPINE 10 MG PO TABS
10.0000 mg | ORAL_TABLET | Freq: Every day | ORAL | Status: DC
  Administered 2023-11-13 – 2023-11-14 (×2): 10 mg via ORAL
  Filled 2023-11-13 (×2): qty 1

## 2023-11-13 MED ORDER — SERTRALINE HCL 25 MG PO TABS
50.0000 mg | ORAL_TABLET | Freq: Every day | ORAL | Status: DC
  Administered 2023-11-14 – 2023-11-18 (×5): 50 mg via ORAL
  Filled 2023-11-13 (×5): qty 2

## 2023-11-13 NOTE — Group Note (Signed)
 Date:  11/13/2023 Time:  9:57 AM  Group Topic/Focus:  Goals Group:   The focus of this group is to help patients establish daily goals to achieve during treatment and discuss how the patient can incorporate goal setting into their daily lives to aide in recovery.   Participation Level:  Did Not Attend   Romond Pipkins A Eyvonne Burchfield 11/13/2023, 9:57 AM

## 2023-11-13 NOTE — Progress Notes (Signed)
   11/13/23 0600  Psych Admission Type (Psych Patients Only)  Admission Status Involuntary  Psychosocial Assessment  Patient Complaints Depression  Eye Contact Brief  Facial Expression Flat  Affect Depressed  Speech Logical/coherent  Interaction Assertive  Motor Activity Slow  Appearance/Hygiene In scrubs  Behavior Characteristics Cooperative  Mood Depressed  Aggressive Behavior  Effect No apparent injury  Thought Process  Coherency WDL  Content WDL  Delusions None reported or observed  Perception WDL  Hallucination Auditory  Judgment Impaired  Confusion WDL  Danger to Self  Current suicidal ideation? Denies  Agreement Not to Harm Self Yes  Danger to Others  Danger to Others None reported or observed  Danger to Others Abnormal  Harmful Behavior to others No threats or harm toward other people  Destructive Behavior No threats or harm toward property

## 2023-11-13 NOTE — Plan of Care (Signed)
   Problem: Activity: Goal: Interest or engagement in activities will improve Outcome: Progressing   Problem: Health Behavior/Discharge Planning: Goal: Compliance with treatment plan for underlying cause of condition will improve Outcome: Progressing   Problem: Safety: Goal: Periods of time without injury will increase Outcome: Progressing

## 2023-11-13 NOTE — Group Note (Signed)
 [facility] LCSW Group Therapy Note   Group Date: 11/13/2023 Start Time: 1300 End Time: 1400   Type of Therapy/Topic:  Group Therapy:  Balance in Life  Participation Level:  Minimal   Description of Group:    This group will address the concept of balance and how it feels and looks when one is unbalanced. Patients will be encouraged to process areas in their lives that are out of balance, and identify reasons for remaining unbalanced. Facilitators will guide patients utilizing problem- solving interventions to address and correct the stressor making their life unbalanced. Understanding and applying boundaries will be explored and addressed for obtaining  and maintaining a balanced life. Patients will be encouraged to explore ways to assertively make their unbalanced needs known to significant others in their lives, using other group members and facilitator for support and feedback.  Therapeutic Goals: Patient will identify two or more emotions or situations they have that consume much of in their lives. Patient will identify signs/triggers that life has become out of balance:  Patient will identify two ways to set boundaries in order to achieve balance in their lives:  Patient will demonstrate ability to communicate their needs through discussion and/or role plays  Summary of Patient Progress: Patient was present for the entirety of the group process. He participated in the icebreaker but not further in the larger discussion. Pt appeared to sleep during group. Insight into the topic and himself remain questionable.    Therapeutic Modalities:   Cognitive Behavioral Therapy Solution-Focused Therapy Assertiveness Training   Randolm Butte, LCSW

## 2023-11-13 NOTE — Progress Notes (Signed)
 Patient presents with flat affect and endorses passive SI along with auditory hallucinations stating "voices are telling me to hurt myself." Patient denies HI and VH. Patient is med compliant and attended 1 out of 3 groups today. Patient observed engaging in activities outside and remains cooperative in unit. Latest CIWA 0.

## 2023-11-13 NOTE — Group Note (Signed)
 Recreation Therapy Group Note   Group Topic:Relaxation  Group Date: 11/13/2023 Start Time: 1000 End Time: 1045 Facilitators: Deatrice Factor, LRT, CTRS Location: Craft Room  Group Description: PMR (Progressive Muscle Relaxation). LRT asks patients their current level of stress/anxiety from 1-10, with 10 being the highest. LRT educates patients on what PMR is and the benefits that come from it. Patients are asked to sit with their feet flat on the floor while sitting up and all the way back in their chair, if possible. LRT and pts follow a prompt through a speaker that requires you to tense and release different muscles in their body and focus on their breathing. During session, lights are off and soft music is being played. Pts are given a stress ball to use if needed. At the end of the prompt, LRT asks patients to rank their current levels of stress/anxiety from 1-10, 10 being the highest. LRT provides patients with an education handout on PMR.   Goal Area(s) Addressed:  Patients will be able to describe progressive muscle relaxation.  Patient will practice using relaxation technique. Patient will identify a new coping skill.  Patient will follow multistep directions to reduce anxiety and stress.   Affect/Mood: N/A   Participation Level: Did not attend    Clinical Observations/Individualized Feedback: Patient did not attend group.   Plan: Continue to engage patient in RT group sessions 2-3x/week.   Deatrice Factor, LRT, CTRS 11/13/2023 1:24 PM

## 2023-11-13 NOTE — Plan of Care (Signed)
   Problem: Activity: Goal: Interest or engagement in activities will improve Outcome: Progressing Goal: Sleeping patterns will improve Outcome: Progressing
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
## 2023-11-13 NOTE — Plan of Care (Signed)

## 2023-11-14 MED ORDER — DORAVIRINE 100 MG PO TABS
100.0000 mg | ORAL_TABLET | Freq: Every day | ORAL | Status: DC
  Administered 2023-11-14 – 2023-11-17 (×4): 100 mg via ORAL
  Filled 2023-11-14 (×5): qty 1

## 2023-11-14 MED ORDER — LAMIVUDINE 150 MG PO TABS
300.0000 mg | ORAL_TABLET | Freq: Every day | ORAL | Status: DC
  Administered 2023-11-14 – 2023-11-17 (×4): 300 mg via ORAL
  Filled 2023-11-14 (×5): qty 2

## 2023-11-14 MED ORDER — PANTOPRAZOLE SODIUM 20 MG PO TBEC
20.0000 mg | DELAYED_RELEASE_TABLET | Freq: Every day | ORAL | Status: DC
  Administered 2023-11-14 – 2023-11-18 (×5): 20 mg via ORAL
  Filled 2023-11-14 (×5): qty 1

## 2023-11-14 MED ORDER — TENOFOVIR DISOPROXIL FUMARATE 300 MG PO TABS
300.0000 mg | ORAL_TABLET | Freq: Every day | ORAL | Status: DC
  Administered 2023-11-14 – 2023-11-17 (×4): 300 mg via ORAL
  Filled 2023-11-14 (×7): qty 1

## 2023-11-14 NOTE — Progress Notes (Signed)
   11/13/23 2246  Psych Admission Type (Psych Patients Only)  Admission Status Involuntary  Psychosocial Assessment  Patient Complaints Depression  Eye Contact Brief  Facial Expression Flat  Affect Depressed  Speech Logical/coherent  Interaction Assertive  Motor Activity Slow  Appearance/Hygiene In scrubs  Behavior Characteristics Cooperative  Mood Depressed  Aggressive Behavior  Effect No apparent injury  Thought Process  Coherency WDL  Content WDL  Delusions None reported or observed  Perception WDL  Hallucination Auditory  Judgment Impaired  Confusion WDL  Danger to Self  Current suicidal ideation? Denies  Agreement Not to Harm Self Yes  Danger to Others  Danger to Others None reported or observed  Danger to Others Abnormal  Harmful Behavior to others No threats or harm toward other people  Destructive Behavior No threats or harm toward property

## 2023-11-14 NOTE — Progress Notes (Signed)
 Patient presents with flat affect and endorses continued auditory hallucinations telling him to hurt himself. Patient denies current SI, HI, and VH with no plan or intent. Patient made aware to notify staff if anything changes. Patient is med compliant and attended 1 out of 2 groups. Patient observed engaging in some activities such as outside time and socializing appropriately. Patient remains cooperative in unit.

## 2023-11-14 NOTE — Group Note (Signed)
 Recreation Therapy Group Note   Group Topic:Leisure Education  Group Date: 11/14/2023 Start Time: 1045 End Time: 1145 Facilitators: Deatrice Factor, LRT, CTRS Location: Craft Room  Group Description: Leisure. Patients were given the option to choose from singing karaoke, coloring mandalas, using oil pastels, journaling, or playing with play-doh. LRT and pts discussed the meaning of leisure, the importance of participating in leisure during their free time/when they're outside of the hospital, as well as how our leisure interests can also serve as coping skills.   Goal Area(s) Addressed:  Patient will identify a current leisure interest.  Patient will learn the definition of "leisure". Patient will practice making a positive decision. Patient will have the opportunity to try a new leisure activity. Patient will communicate with peers and LRT.    Affect/Mood: Appropriate   Participation Level: Non-verbal    Clinical Observations/Individualized Feedback: Dobie came to group with 5 minutes remaining. Pt did not interact with LRT or peers while present.   Plan: Continue to engage patient in RT group sessions 2-3x/week.   Deatrice Factor, LRT, CTRS 11/14/2023 1:33 PM

## 2023-11-14 NOTE — Plan of Care (Signed)
  Problem: Education: Goal: Mental status will improve Outcome: Progressing   Problem: Activity: Goal: Interest or engagement in activities will improve Outcome: Progressing   Problem: Health Behavior/Discharge Planning: Goal: Compliance with treatment plan for underlying cause of condition will improve Outcome: Progressing   Problem: Safety: Goal: Periods of time without injury will increase Outcome: Progressing

## 2023-11-14 NOTE — Group Note (Signed)
 Date:  11/14/2023 Time:  12:04 PM  Group Topic/Focus:  Making Healthy Choices:   The focus of this group is to help patients identify negative/unhealthy choices they were using prior to admission and identify positive/healthier coping strategies to replace them upon discharge.    Participation Level:  Did Not Attend   Charles Charles Charles Charles 11/14/2023, 12:04 PM

## 2023-11-14 NOTE — [facility] Counselor (Signed)
 CSW sat with pt for ACTT screening services with [facility] team lead, T. Stanfield. Screening went well. Pt was appropriate and cooperative. No other concerns expressed. Contact ended without incident.   Charles Charles shared that pt would be more appropriate for CST services, but he would review it with his team and let us  know. No other concerns expressed. Contact ended without incident.   Charles Charles. Charles Charles, MSW, LCSW, LCAS 11/14/2023 3:28 PM

## 2023-11-14 NOTE — Group Note (Signed)
 Date:  11/14/2023 Time:  9:06 PM  Group Topic/Focus:  Healthy Communication:   The focus of this group is to discuss communication, barriers to communication, as well as healthy ways to communicate with others.    Participation Level:  Active  Participation Quality:  Appropriate  Affect:  Appropriate  Cognitive:  Appropriate  Insight: Appropriate  Engagement in Group:  Engaged  Modes of Intervention:  Discussion and Education  Additional Comments:  n/a  Anhthu Perdew L 11/14/2023, 9:06 PM
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
## 2023-11-15 DIAGNOSIS — F332 Major depressive disorder, recurrent severe without psychotic features: Secondary | ICD-10-CM | POA: Diagnosis not present

## 2023-11-15 LAB — RPR
RPR Ser Ql: REACTIVE — AB
RPR Titer: 1:1 {titer}

## 2023-11-15 MED ORDER — OLANZAPINE 5 MG PO TABS
15.0000 mg | ORAL_TABLET | Freq: Every day | ORAL | Status: DC
  Administered 2023-11-16 – 2023-11-17 (×2): 15 mg via ORAL
  Filled 2023-11-15 (×2): qty 1

## 2023-11-15 NOTE — Progress Notes (Signed)
   11/14/23 2100  Psych Admission Type (Psych Patients Only)  Admission Status Involuntary  Psychosocial Assessment  Patient Complaints Depression;Sadness  Eye Contact Fair  Facial Expression Flat;Sad  Affect Apprehensive  Speech Logical/coherent  Interaction Assertive  Motor Activity Slow  Appearance/Hygiene Improved  Behavior Characteristics Appropriate to situation;Cooperative  Mood Pleasant  Aggressive Behavior  Effect No apparent injury  Thought Process  Coherency WDL  Content WDL  Delusions None reported or observed  Perception WDL  Hallucination None reported or observed  Judgment Impaired  Confusion None  Danger to Self  Current suicidal ideation? Denies  Danger to Others  Danger to Others None reported or observed  Danger to Others Abnormal  Harmful Behavior to others No threats or harm toward other people  Destructive Behavior No threats or harm toward property   Patient alert and oriented x 4, affect is flat thoughts are organized and coherent, he appears apprehensive and sad, he was offered emotional support. Patient complaint with medication regimen. 15 minutes safety checks maintained.

## 2023-11-15 NOTE — Plan of Care (Signed)
  Problem: Education: Goal: Knowledge of [facility] Education information/materials will improve Outcome: Progressing Goal: Emotional status will improve Outcome: Progressing Goal: Mental status will improve Outcome: Progressing Goal: Verbalization of understanding the information provided will improve Outcome: Progressing   Problem: Activity: Goal: Interest or engagement in activities will improve Outcome: Progressing Goal: Sleeping patterns will improve Outcome: Progressing   Problem: Coping: Goal: Ability to verbalize frustrations and anger appropriately will improve Outcome: Progressing Goal: Ability to demonstrate self-control will improve Outcome: Progressing   Problem: Health Behavior/Discharge Planning: Goal: Identification of resources available to assist in meeting health care needs will improve Outcome: Progressing Goal: Compliance with treatment plan for underlying cause of condition will improve Outcome: Progressing   Problem: Physical Regulation: Goal: Ability to maintain clinical measurements within normal limits will improve Outcome: Progressing   Problem: Safety: Goal: Periods of time without injury will increase Outcome: Progressing   Problem: Education: Goal: Utilization of techniques to improve thought processes will improve Outcome: Progressing Goal: Knowledge of the prescribed therapeutic regimen will improve Outcome: Progressing   Problem: Activity: Goal: Interest or engagement in leisure activities will improve Outcome: Progressing Goal: Imbalance in normal sleep/wake cycle will improve Outcome: Progressing   Problem: Coping: Goal: Coping ability will improve Outcome: Progressing Goal: Will verbalize feelings Outcome: Progressing   Problem: Health Behavior/Discharge Planning: Goal: Ability to make decisions will improve Outcome: Progressing Goal: Compliance with therapeutic regimen will improve Outcome: Progressing    Problem: Role Relationship: Goal: Will demonstrate positive changes in social behaviors and relationships Outcome: Progressing   Problem: Safety: Goal: Ability to disclose and discuss suicidal ideas will improve Outcome: Progressing Goal: Ability to identify and utilize support systems that promote safety will improve Outcome: Progressing   Problem: Self-Concept: Goal: Will verbalize positive feelings about self Outcome: Progressing Goal: Level of anxiety will decrease Outcome: Progressing   Problem: Education: Goal: Ability to make informed decisions regarding treatment will improve Outcome: Progressing   Problem: Coping: Goal: Coping ability will improve Outcome: Progressing   Problem: Health Behavior/Discharge Planning: Goal: Identification of resources available to assist in meeting health care needs will improve Outcome: Progressing   Problem: Medication: Goal: Compliance with prescribed medication regimen will improve Outcome: Progressing   Problem: Self-Concept: Goal: Ability to disclose and discuss suicidal ideas will improve Outcome: Progressing   Problem: Education: Goal: Utilization of techniques to improve thought processes will improve Outcome: Progressing Goal: Knowledge of the prescribed therapeutic regimen will improve Outcome: Progressing   Problem: Activity: Goal: Interest or engagement in leisure activities will improve Outcome: Progressing Goal: Imbalance in normal sleep/wake cycle will improve Outcome: Progressing   Problem: Coping: Goal: Coping ability will improve Outcome: Progressing Goal: Will verbalize feelings Outcome: Progressing   Problem: Health Behavior/Discharge Planning: Goal: Ability to make decisions will improve Outcome: Progressing Goal: Compliance with therapeutic regimen will improve Outcome: Progressing   Problem: Role Relationship: Goal: Will demonstrate positive changes in social behaviors and  relationships Outcome: Progressing   Problem: Safety: Goal: Ability to disclose and discuss suicidal ideas will improve Outcome: Progressing Goal: Ability to identify and utilize support systems that promote safety will improve Outcome: Progressing   Problem: Self-Concept: Goal: Will verbalize positive feelings about self Outcome: Progressing Goal: Level of anxiety will decrease Outcome: Progressing
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
## 2023-11-15 NOTE — Progress Notes (Signed)
 This Clinical research associate went to get patient for scheduled morning medication, and when this Clinical research associate called out to patient he pulled the covers over his head and ignored this Clinical research associate. NP will be notified.

## 2023-11-15 NOTE — Plan of Care (Signed)
  Problem: Education: Goal: Knowledge of [facility] Education information/materials will improve Outcome: Progressing   Problem: Education: Goal: Emotional status will improve Outcome: Progressing   Problem: Education: Goal: Mental status will improve Outcome: Progressing   

## 2023-11-15 NOTE — Group Note (Signed)
 Date:  11/15/2023 Time:  3:39 PM  Group Topic/Focus:  Primary and Secondary Emotions:   The focus of this group is to discuss the difference between primary and secondary emotions.     Participation Level:  Did Not Attend   Marianna Shirk Ido Wollman 11/15/2023, 3:39 PM

## 2023-11-15 NOTE — [facility] Suicide Risk Assessment (Signed)
 [facility] INPATIENT:  Family/Significant Other Suicide Prevention Education  Suicide Prevention Education:  Contact Attempts:   Redell Canavan, mother, [phone], (name of family member/significant other) has been identified by the patient as the family member/significant other with whom the patient will be residing, and identified as the person(s) who will aid the patient in the event of a mental health crisis.  With written consent from the patient, two attempts were made to provide suicide prevention education, prior to and/or following the patient's discharge.  We were unsuccessful in providing suicide prevention education.  A suicide education pamphlet was given to the patient to share with family/significant other.  Date and time of first attempt:11/15/2023  CSW unable to leave VM due to VM box being full.   Claudio Culver 11/15/2023, 1:26 PM

## 2023-11-16 DIAGNOSIS — F333 Major depressive disorder, recurrent, severe with psychotic symptoms: Secondary | ICD-10-CM

## 2023-11-16 NOTE — Progress Notes (Signed)
   11/16/23 0900  Psych Admission Type (Psych Patients Only)  Admission Status Involuntary  Psychosocial Assessment  Patient Complaints None  Eye Contact Fair  Facial Expression Flat  Affect Flat  Speech Logical/coherent;Soft  Interaction Assertive  Motor Activity Slow  Appearance/Hygiene Unremarkable  Behavior Characteristics Cooperative  Mood Pleasant  Thought Process  Coherency WDL  Content WDL  Delusions None reported or observed  Perception WDL  Hallucination None reported or observed  Judgment WDL  Confusion None  Danger to Self  Current suicidal ideation? Denies  Self-Injurious Behavior No self-injurious ideation or behavior indicators observed or expressed   Danger to Others  Danger to Others None reported or observed  Danger to Others Abnormal  Harmful Behavior to others No threats or harm toward other people  Destructive Behavior No threats or harm toward property

## 2023-11-16 NOTE — Plan of Care (Signed)
  Problem: Education: Goal: Emotional status will improve Outcome: Progressing   Problem: Education: Goal: Knowledge of [facility] Education information/materials will improve Outcome: Progressing   Problem: Education: Goal: Mental status will improve Outcome: Progressing   Problem: Education: Goal: Verbalization of understanding the information provided will improve Outcome: Progressing   Problem: Activity: Goal: Interest or engagement in activities will improve Outcome: Progressing

## 2023-11-16 NOTE — Progress Notes (Signed)
   11/15/23 2100  Psych Admission Type (Psych Patients Only)  Admission Status Involuntary  Psychosocial Assessment  Patient Complaints Anxiety;Depression  Eye Contact Fair  Facial Expression Flat;Sad  Affect Apprehensive  Speech Logical/coherent  Interaction Assertive  Motor Activity Slow  Appearance/Hygiene Improved  Behavior Characteristics Cooperative;Appropriate to situation  Mood Pleasant  Aggressive Behavior  Effect No apparent injury  Thought Process  Coherency WDL  Content WDL  Delusions None reported or observed  Perception WDL  Hallucination None reported or observed  Judgment Impaired  Confusion None  Danger to Self  Current suicidal ideation? Denies  Danger to Others  Danger to Others None reported or observed  Danger to Others Abnormal  Harmful Behavior to others No threats or harm toward other people  Destructive Behavior No threats or harm toward property   Patient alert and oriented x 4, affect is flat but brightens upon approach, he denies SI/HI/AVH 15 minutes safety checks maintained will continue closely.

## 2023-11-16 NOTE — Group Note (Signed)
 Date:  11/16/2023 Time:  6:15 PM  Group Topic/Focus:  Activity Group: The focus of this group is to promote activity for the patients and encourage them to go outside to the courtyard to get some fresh air and some exercise.    Participation Level:  Active  Participation Quality:  Appropriate  Affect:  Appropriate  Cognitive:  Appropriate  Insight: Appropriate  Engagement in Group:  Engaged  Modes of Intervention:  Activity  Additional Comments:    Marianna Shirk Keilani Terrance 11/16/2023, 6:15 PM

## 2023-11-16 NOTE — Group Note (Signed)
 Date:  11/16/2023 Time:  10:13 AM  Group Topic/Focus:  Goals Group:   The focus of this group is to help patients establish daily goals to achieve during treatment and discuss how the patient can incorporate goal setting into their daily lives to aide in recovery.    Participation Level:  Did Not Attend   Charles Charles Charles Charles 11/16/2023, 10:13 AM

## 2023-11-16 NOTE — Group Note (Signed)
 Date:  11/16/2023 Time:  8:38 PM  Group Topic/Focus:  Wrap-Up Group:   The focus of this group is to help patients review their daily goal of treatment and discuss progress on daily workbooks.    Participation Level:  Active  Participation Quality:  Appropriate and Attentive  Affect:  Appropriate  Cognitive:  Alert  Insight: Appropriate  Engagement in Group:  Engaged  Modes of Intervention:  Discussion  Additional Comments:     Maglione,Arminta Gamm E 11/16/2023, 8:38 PM
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
## 2023-11-16 NOTE — Plan of Care (Signed)
  Problem: Education: Goal: Emotional status will improve Outcome: Progressing Goal: Mental status will improve Outcome: Progressing Goal: Verbalization of understanding the information provided will improve Outcome: Progressing   Problem: Health Behavior/Discharge Planning: Goal: Compliance with treatment plan for underlying cause of condition will improve Outcome: Progressing   Problem: Physical Regulation: Goal: Ability to maintain clinical measurements within normal limits will improve Outcome: Progressing   Problem: Safety: Goal: Periods of time without injury will increase Outcome: Progressing

## 2023-11-17 LAB — T.PALLIDUM AB, TOTAL: T Pallidum Abs: REACTIVE — AB

## 2023-11-17 MED ORDER — IBUPROFEN 600 MG PO TABS
600.0000 mg | ORAL_TABLET | Freq: Three times a day (TID) | ORAL | Status: DC | PRN
  Administered 2023-11-17: 600 mg via ORAL
  Filled 2023-11-17: qty 1

## 2023-11-17 NOTE — Group Note (Signed)
 [facility] LCSW Group Therapy Note   Group Date: 11/17/2023 Start Time: 1300 End Time: 1400   Type of Therapy/Topic:  Group Therapy:  Emotion Regulation  Participation Level:  Did Not Attend   Mood:  Description of Group:    The purpose of this group is to assist patients in learning to regulate negative emotions and experience positive emotions. Patients will be guided to discuss ways in which they have been vulnerable to their negative emotions. These vulnerabilities will be juxtaposed with experiences of positive emotions or situations, and patients challenged to use positive emotions to combat negative ones. Special emphasis will be placed on coping with negative emotions in conflict situations, and patients will process healthy conflict resolution skills.  Therapeutic Goals: Patient will identify two positive emotions or experiences to reflect on in order to balance out negative emotions:  Patient will label two or more emotions that they find the most difficult to experience:  Patient will be able to demonstrate positive conflict resolution skills through discussion or role plays:   Summary of Patient Progress:   The patient did not attend group.     Therapeutic Modalities:   Cognitive Behavioral Therapy Feelings Identification Dialectical Behavioral Therapy   Roselle Conner, LCSW
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
## 2023-11-17 NOTE — Plan of Care (Signed)
  Problem: Education: Goal: Emotional status will improve Outcome: Progressing   Problem: Education: Goal: Mental status will improve Outcome: Progressing   Problem: Education: Goal: Verbalization of understanding the information provided will improve Outcome: Progressing   Problem: Activity: Goal: Interest or engagement in activities will improve Outcome: Progressing   

## 2023-11-17 NOTE — [facility] Suicide Risk Assessment (Signed)
 [facility] INPATIENT:  Family/Significant Other Suicide Prevention Education  Suicide Prevention Education:  Contact Attempts: Charles Charles, mother, [phone], (name of family member/significant other) has been identified by the patient as the family member/significant other with whom the patient will be residing, and identified as the person(s) who will aid the patient in the event of a mental health crisis.  With written consent from the patient, two attempts were made to provide suicide prevention education, prior to and/or following the patient's discharge.  We were unsuccessful in providing suicide prevention education.  A suicide education pamphlet was given to the patient to share with family/significant other.  Date and time of first attempt:11/15/2023  Date and time of second attempt: 11/17/2023 at 1:02PM  CSW attempted to call pt's number but received message that "number can't be complete as dialed"  number was checked several times for accuracy.  Charles Charles 11/17/2023, 1:02 PM

## 2023-11-17 NOTE — Progress Notes (Signed)
   11/17/23 1500  Psych Admission Type (Psych Patients Only)  Admission Status Involuntary  Psychosocial Assessment  Patient Complaints Anxiety;Depression;Other (Comment) (patient states "I stay like that" and that he is "nervous right now"; patient also states that the negative voices are coming back.)  Eye Contact Fair;Watchful  Facial Expression Anxious  Affect Appropriate to circumstance  Speech Logical/coherent  Interaction Assertive  Motor Activity Slow  Appearance/Hygiene In scrubs  Behavior Characteristics Cooperative;Appropriate to situation  Mood Pleasant  Aggressive Behavior  Effect No apparent injury  Thought Process  Coherency WDL  Content WDL  Delusions None reported or observed  Perception Hallucinations  Hallucination Auditory (per patient, negative voices coming back".)  Judgment WDL  Confusion None  Danger to Self  Current suicidal ideation? Denies  Self-Injurious Behavior No self-injurious ideation or behavior indicators observed or expressed   Agreement Not to Harm Self Yes  Description of Agreement Verbal  Danger to Others  Danger to Others None reported or observed  Danger to Others Abnormal  Harmful Behavior to others No threats or harm toward other people  Destructive Behavior No threats or harm toward property

## 2023-11-17 NOTE — Group Note (Signed)
 Recreation Therapy Group Note   Group Topic:Coping Skills  Group Date: 11/17/2023 Start Time: 1030 End Time: 1120 Facilitators: Deatrice Factor, LRT, CTRS Location: Craft Room  Group Description: Mind Map.  Patient was provided a blank template of a diagram with 32 blank boxes in a tiered system, branching from the center (similar to a bubble chart). LRT directed patients to label the middle of the diagram "Coping Skills". LRT and patients then came up with 8 different coping skills as examples. Pt were directed to record their coping skills in the 2nd tier boxes closest to the center.  Patients would then share their coping skills with the group as LRT wrote them out. LRT gave a handout of 99 different coping skills at the end of group.   Goal Area(s) Addressed: Patients will be able to define "coping skills". Patient will identify new coping skills.  Patient will increase communication.   Affect/Mood: N/A   Participation Level: Did not attend    Clinical Observations/Individualized Feedback: Patient did not attend group.   Plan: Continue to engage patient in RT group sessions 2-3x/week.   Deatrice Factor, LRT, CTRS 11/17/2023 12:57 PM

## 2023-11-17 NOTE — Group Note (Signed)
 Date:  11/17/2023 Time:  10:08 AM  Group Topic/Focus:  Goals Group:   The focus of this group is to help patients establish daily goals to achieve during treatment and discuss how the patient can incorporate goal setting into their daily lives to aide in recovery.    Participation Level:  Did Not Attend   Mellie Sprinkle [facility] 11/17/2023, 10:08 AM

## 2023-11-17 NOTE — Group Note (Deleted)
 LCSW Group Therapy Note   Group Date: 11/17/2023 Start Time: 1300 End Time: 1400   Type of Therapy and Topic:  Group Therapy: Challenging Core Beliefs  Participation Level:  {[facility] PARTICIPATION [id number]}  Description of Group:  Patients were educated about core beliefs and asked to identify one harmful core belief that they have. Patients were asked to explore from where those beliefs originate. Patients were asked to discuss how those beliefs make them feel and the resulting behaviors of those beliefs. They were then be asked if those beliefs are true and, if so, what evidence they have to support them. Lastly, group members were challenged to replace those negative core beliefs with helpful beliefs.   Therapeutic Goals:   1. Patient will identify harmful core beliefs and explore the origins of such beliefs. 2. Patient will identify feelings and behaviors that result from those core beliefs. 3. Patient will discuss whether such beliefs are true. 4.  Patient will replace harmful core beliefs with helpful ones.  Summary of Patient Progress:  *** actively engaged in processing and exploring how core beliefs are formed and how they impact thoughts, feelings, and behaviors. Patient proved open to input from peers and feedback from CSW. Patient demonstrated *** insight into the subject matter, was respectful and supportive of peers, and participated throughout the entire session.  Therapeutic Modalities: Cognitive Behavioral Therapy; Solution-Focused Therapy   Herley Bernardini M Shiesha Jahn, Milinda Allen 11/17/2023  2:11 PM

## 2023-11-17 NOTE — Group Note (Signed)
 Recreation Therapy Group Note   Group Topic:Health and Wellness  Group Date: 11/17/2023 Start Time: 1515 End Time: 1615 Facilitators: Deatrice Factor, LRT, CTRS Location: Courtyard  Group Description: [employer]. LRT and patients played games of basketball, drew with chalk, and played corn hole while outside in the courtyard while getting fresh air and sunlight. Music was being played in the background. LRT and peers conversed about different games they have played before, what they do in their free time and anything else that is on their minds. LRT encouraged pts to drink water  after being outside, sweating and getting their heart rate up.  Goal Area(s) Addressed: Patient will build on frustration tolerance skills. Patients will partake in a competitive play game with peers. Patients will gain knowledge of new leisure interest/hobby.    Affect/Mood: Appropriate   Participation Level: Minimal    Clinical Observations/Individualized Feedback: Charles Charles was present in group for 10 minutes.   Plan: Continue to engage patient in RT group sessions 2-3x/week.   Deatrice Factor, LRT, CTRS 11/17/2023 5:06 PM

## 2023-11-17 NOTE — Plan of Care (Signed)
 Patient is progressing with his plan of care.

## 2023-11-17 NOTE — Progress Notes (Signed)
   11/16/23 2000  Psych Admission Type (Psych Patients Only)  Admission Status Involuntary  Psychosocial Assessment  Patient Complaints None  Eye Contact Fair  Facial Expression Flat;Sad  Affect Apprehensive  Speech Logical/coherent  Interaction Assertive  Motor Activity Slow  Appearance/Hygiene Improved  Behavior Characteristics Cooperative;Unable to participate  Mood Pleasant  Aggressive Behavior  Effect No apparent injury  Thought Process  Coherency WDL  Content WDL  Delusions None reported or observed  Perception WDL  Hallucination None reported or observed  Judgment Impaired  Confusion None  Danger to Self  Current suicidal ideation? Denies  Danger to Others  Danger to Others None reported or observed  Danger to Others Abnormal  Harmful Behavior to others No threats or harm toward other people  Destructive Behavior No threats or harm toward property   Patient is alert and oriented x 4, he denies SI/HI/AVH interacting appropriately with peers and staff. 15 minutes safety checks maintained.

## 2023-11-17 NOTE — Group Note (Signed)
 Date:  11/17/2023 Time:  9:10 PM  Group Topic/Focus:  Emotional Education:   The focus of this group is to discuss what feelings/emotions are, and how they are experienced.    Participation Level:  Active  Participation Quality:  Appropriate and Supportive  Affect:  Appropriate  Cognitive:  Appropriate  Insight: Appropriate  Engagement in Group:  Engaged and Supportive  Modes of Intervention:  Support  Additional Comments:  n/a  Charles Charles L 11/17/2023, 9:10 PM

## 2023-11-17 NOTE — BH IP Treatment Plan (Signed)
 Interdisciplinary Treatment and Diagnostic Plan Update  11/17/2023 Time of Session: 11:00AM Charles Charles MRN: [id number]  Principal Diagnosis: MDD (major depressive disorder), recurrent episode, severe (HCC)  Secondary Diagnoses: Principal Problem:   MDD (major depressive disorder), recurrent episode, severe (HCC) Active Problems:   Undifferentiated schizophrenia (HCC)   Current Medications:  Current Facility-Administered Medications  Medication Dose Route Frequency Provider Last Rate Last Admin   acetaminophen  (TYLENOL ) tablet 650 mg  650 mg Oral Q6H PRN Onuoha, Chinwendu V, NP   650 mg at 11/17/23 0824   alum & mag hydroxide-simeth (MAALOX/MYLANTA) 200-200-20 MG/5ML suspension 15 mL  15 mL Oral Q6H PRN Onuoha, Chinwendu V, NP       chlordiazePOXIDE  (LIBRIUM ) capsule 25 mg  25 mg Oral Q6H PRN Millington, Matthew E, PA-C       haloperidol  (HALDOL ) tablet 5 mg  5 mg Oral TID PRN Onuoha, Chinwendu V, NP       And   diphenhydrAMINE  (BENADRYL ) capsule 50 mg  50 mg Oral TID PRN Onuoha, Chinwendu V, NP       haloperidol  lactate (HALDOL ) injection 5 mg  5 mg Intramuscular TID PRN Onuoha, Chinwendu V, NP       And   diphenhydrAMINE  (BENADRYL ) injection 50 mg  50 mg Intramuscular TID PRN Onuoha, Chinwendu V, NP       And   LORazepam  (ATIVAN ) injection 2 mg  2 mg Intramuscular TID PRN Onuoha, Chinwendu V, NP       haloperidol  lactate (HALDOL ) injection 10 mg  10 mg Intramuscular TID PRN Onuoha, Chinwendu V, NP       And   diphenhydrAMINE  (BENADRYL ) injection 50 mg  50 mg Intramuscular TID PRN Onuoha, Chinwendu V, NP       And   LORazepam  (ATIVAN ) injection 2 mg  2 mg Intramuscular TID PRN Onuoha, Chinwendu V, NP       doravirine  (PIFELTRO ) tablet 100 mg  100 mg Oral Daily Ravishankar, Merrie Abed, MD   100 mg at 11/16/23 1726   And   tenofovir  (VIREAD ) tablet 300 mg  300 mg Oral Daily Ravishankar, Jayashree, MD   300 mg at 11/16/23 1726   And   lamiVUDine  (EPIVIR ) tablet 300 mg  300 mg Oral  Daily Alica Inks, MD   300 mg at 11/16/23 1724   hydrOXYzine  (ATARAX ) tablet 25 mg  25 mg Oral TID PRN Millington, Matthew E, PA-C   25 mg at 11/17/23 9562   ibuprofen  (ADVIL ) tablet 600 mg  600 mg Oral Q8H PRN Millington, Matthew E, PA-C       magnesium  hydroxide (MILK OF MAGNESIA) suspension 15 mL  15 mL Oral Daily PRN Onuoha, Chinwendu V, NP       OLANZapine  (ZYPREXA ) tablet 15 mg  15 mg Oral QHS Weber, Kyra A, NP   15 mg at 11/16/23 2120   pantoprazole  (PROTONIX ) EC tablet 20 mg  20 mg Oral Daily Millington, Matthew E, PA-C   20 mg at 11/17/23 1308   sertraline  (ZOLOFT ) tablet 50 mg  50 mg Oral Daily Millington, Matthew E, PA-C   50 mg at 11/17/23 6578   PTA Medications: Medications Prior to Admission  Medication Sig Dispense Refill Last Dose/Taking   albuterol  (VENTOLIN  HFA) 108 (90 Base) MCG/ACT inhaler Inhale 2 puffs into the lungs every 6 (six) hours as needed for wheezing or shortness of breath. (Patient not taking: Reported on 11/11/2023)   Not Taking   ANORO ELLIPTA  62.5-25 MCG/ACT AEPB Inhale 1 puff into  the lungs daily. (Patient not taking: Reported on 11/11/2023)   Not Taking   atorvastatin  (LIPITOR ) 80 MG tablet Take 80 mg by mouth at bedtime. (Patient not taking: Reported on 11/11/2023)   Not Taking   DELSTRIGO 100-300-300 MG TABS per tablet Take 1 tablet by mouth daily. (Patient not taking: Reported on 11/11/2023)   Not Taking   FLUoxetine  (PROZAC ) 20 MG capsule Take 1 capsule (20 mg total) by mouth daily. (Patient not taking: Reported on 11/11/2023) 30 capsule 0 Not Taking   levothyroxine  (SYNTHROID ) 50 MCG tablet Take 1 tablet (50 mcg total) by mouth daily at 6 (six) AM. (Patient not taking: Reported on 11/10/2023) 30 tablet 0 Not Taking   metFORMIN  (GLUCOPHAGE -XR) 500 MG 24 hr tablet Take 1 tablet (500 mg total) by mouth daily with breakfast. (Patient not taking: Reported on 11/11/2023) 30 tablet 0 Not Taking   nicotine  polacrilex (NICORETTE ) 4 MG gum Take 1 each (4 mg  total) by mouth as needed for smoking cessation. (Patient not taking: Reported on 11/10/2023) 100 tablet 0 Not Taking   omeprazole (PRILOSEC) 20 MG capsule Take 20 mg by mouth daily. (Patient not taking: Reported on 12/03/2022)   Not Taking   PLAVIX  75 MG tablet Take 75 mg by mouth daily. (Patient not taking: Reported on 11/10/2023)   Not Taking   traZODone  (DESYREL ) 50 MG tablet Take 1 tablet (50 mg total) by mouth at bedtime as needed for sleep. (Patient not taking: Reported on 11/11/2023) 30 tablet 0 Not Taking    Patient Stressors:    Patient Strengths:    Treatment Modalities: Medication Management, Group therapy, Case management,  1 to 1 session with clinician, Psychoeducation, Recreational therapy.   Physician Treatment Plan for Primary Diagnosis: MDD (major depressive disorder), recurrent episode, severe (HCC) Long Term Goal(s): Improvement in symptoms so as ready for discharge   Short Term Goals: Ability to identify changes in lifestyle to reduce recurrence of condition will improve Ability to demonstrate self-control will improve Ability to identify and develop effective coping behaviors will improve Compliance with prescribed medications will improve Ability to identify triggers associated with substance abuse/mental health issues will improve  Medication Management: Evaluate patient's response, side effects, and tolerance of medication regimen.  Therapeutic Interventions: 1 to 1 sessions, Unit Group sessions and Medication administration.  Evaluation of Outcomes: Progressing  Physician Treatment Plan for Secondary Diagnosis: Principal Problem:   MDD (major depressive disorder), recurrent episode, severe (HCC) Active Problems:   Undifferentiated schizophrenia (HCC)  Long Term Goal(s): Improvement in symptoms so as ready for discharge   Short Term Goals: Ability to identify changes in lifestyle to reduce recurrence of condition will improve Ability to demonstrate  self-control will improve Ability to identify and develop effective coping behaviors will improve Compliance with prescribed medications will improve Ability to identify triggers associated with substance abuse/mental health issues will improve     Medication Management: Evaluate patient's response, side effects, and tolerance of medication regimen.  Therapeutic Interventions: 1 to 1 sessions, Unit Group sessions and Medication administration.  Evaluation of Outcomes: Progressing   RN Treatment Plan for Primary Diagnosis: MDD (major depressive disorder), recurrent episode, severe (HCC) Long Term Goal(s): Knowledge of disease and therapeutic regimen to maintain health will improve  Short Term Goals: Ability to demonstrate self-control, Ability to participate in decision making will improve, Ability to verbalize feelings will improve, Ability to disclose and discuss suicidal ideas, Ability to identify and develop effective coping behaviors will improve, and Compliance with prescribed medications  will improve  Medication Management: RN will administer medications as ordered by provider, will assess and evaluate patient's response and provide education to patient for prescribed medication. RN will report any adverse and/or side effects to prescribing provider.  Therapeutic Interventions: 1 on 1 counseling sessions, Psychoeducation, Medication administration, Evaluate responses to treatment, Monitor vital signs and CBGs as ordered, Perform/monitor CIWA, COWS, AIMS and Fall Risk screenings as ordered, Perform wound care treatments as ordered.  Evaluation of Outcomes: Progressing   LCSW Treatment Plan for Primary Diagnosis: MDD (major depressive disorder), recurrent episode, severe (HCC) Long Term Goal(s): Safe transition to appropriate next level of care at discharge, Engage patient in therapeutic group addressing interpersonal concerns.  Short Term Goals: Engage patient in aftercare planning  with referrals and resources, Increase social support, Increase ability to appropriately verbalize feelings, Increase emotional regulation, Facilitate acceptance of mental health diagnosis and concerns, Facilitate patient progression through stages of change regarding substance use diagnoses and concerns, Identify triggers associated with mental health/substance abuse issues, and Increase skills for wellness and recovery  Therapeutic Interventions: Assess for all discharge needs, 1 to 1 time with Social worker, Explore available resources and support systems, Assess for adequacy in community support network, Educate family and significant other(s) on suicide prevention, Complete Psychosocial Assessment, Interpersonal group therapy.  Evaluation of Outcomes: Progressing   Progress in Treatment: Attending groups: Yes. Participating in groups: Yes. Taking medication as prescribed: Yes. Toleration medication: Yes. Family/Significant other contact made: Yes, individual(s) contacted:  SPE attempts have been made with the patient's mother.  Patient understands diagnosis: Yes. Discussing patient identified problems/goals with staff: Yes. Medical problems stabilized or resolved: Yes. Denies suicidal/homicidal ideation: Yes. Issues/concerns per patient self-inventory: No. Other: none  New problem(s) identified: No, Describe:  none  Update 11/17/2023: No changes at this time.    New Short Term/Long Term Goal(s): detox, elimination of symptoms of psychosis, medication management for mood stabilization; elimination of SI thoughts; development of comprehensive mental wellness/sobriety plan. Update 11/17/2023: No changes at this time.     Patient Goals:  "I want to get off this crack for real so bad" Update 11/17/2023: No changes at this time.    Discharge Plan or Barriers: CSW to assist with development of appropriate discharge plans. Update 11/17/2023: Referrals for ACT have been sent.  Patient to follow up  at discharge.  Reason for Continuation of Hospitalization: Anxiety Depression Medication stabilization Suicidal ideation   Estimated Length of Stay:  1-7 days Update 11/17/2023: TBD  Last 3 [place] Suicide Severity Risk Score: Flowsheet Row Admission (Current) from 11/11/2023 in [facility] ED from 11/10/2023 in [facility] Emergency Department at [facility] ED from 02/23/2023 in [facility] Emergency Department at [facility]  C-SSRS RISK CATEGORY Low Risk Low Risk No Risk       Last PHQ 2/9 Scores:     No data to display          Scribe for Treatment Team: Larri Ply, Buzz Cass 11/17/2023 12:48 PM

## 2023-11-18 ENCOUNTER — Other Ambulatory Visit: Payer: Self-pay

## 2023-11-18 MED ORDER — TENOFOVIR DISOPROXIL FUMARATE 300 MG PO TABS
300.0000 mg | ORAL_TABLET | Freq: Every day | ORAL | 0 refills | Status: DC
Start: 2023-11-18 — End: 2023-11-18
  Filled 2023-11-18: qty 30, 30d supply, fill #0

## 2023-11-18 MED ORDER — SERTRALINE HCL 50 MG PO TABS
50.0000 mg | ORAL_TABLET | Freq: Every day | ORAL | 0 refills | Status: DC
  Filled 2023-11-18: qty 30, 30d supply, fill #0

## 2023-11-18 MED ORDER — OLANZAPINE 15 MG PO TABS
15.0000 mg | ORAL_TABLET | Freq: Every day | ORAL | 0 refills | Status: DC
  Filled 2023-11-18: qty 30, 30d supply, fill #0

## 2023-11-18 MED ORDER — HYDROXYZINE HCL 25 MG PO TABS
25.0000 mg | ORAL_TABLET | Freq: Three times a day (TID) | ORAL | 0 refills | Status: AC | PRN
  Filled 2023-11-18: qty 30, 10d supply, fill #0

## 2023-11-18 MED ORDER — DORAVIRIN-LAMIVUDIN-TENOFOV DF 100-300-300 MG PO TABS: 1.0000 | ORAL_TABLET | Freq: Every day | ORAL | Status: DC

## 2023-11-18 MED ORDER — DORAVIRINE 100 MG PO TABS
100.0000 mg | ORAL_TABLET | Freq: Every day | ORAL | 0 refills | Status: DC
  Filled 2023-11-18: qty 30, 30d supply, fill #0

## 2023-11-18 MED ORDER — LAMIVUDINE 300 MG PO TABS
300.0000 mg | ORAL_TABLET | Freq: Every day | ORAL | 0 refills | Status: DC
  Filled 2023-11-18: qty 30, 30d supply, fill #0

## 2023-11-18 MED ORDER — PANTOPRAZOLE SODIUM 20 MG PO TBEC
20.0000 mg | DELAYED_RELEASE_TABLET | Freq: Every day | ORAL | 0 refills | Status: AC
  Filled 2023-11-18: qty 30, 30d supply, fill #0

## 2023-11-18 MED ORDER — LAMIVUDINE 300 MG PO TABS
300.0000 mg | ORAL_TABLET | Freq: Every day | ORAL | 0 refills | Status: DC
Start: 2023-11-18 — End: 2023-11-18

## 2023-11-18 MED ORDER — DELSTRIGO 100-300-300 MG PO TABS: 1.0000 | ORAL_TABLET | Freq: Every day | ORAL | 0 refills | Status: DC

## 2023-11-18 NOTE — Progress Notes (Signed)
 Antimicrobial Stewardship - Pharmacy  Patient on Delstrigo (combination pill for HIV).  Inpatient pharmacy at [facility] did not stock but we did have the individual components. So tenofovir , lamivudine , doravirine  were prescribed individually while admitted.  Upon discharge noted the individual components continued and not the All in one Delstrigo.  Coordinated with the PA, [facility] outpatient pharmacy and [facility] to correct outpatient prescription to be Delstrigo alone.    Orders cancelled for doravirine  and tenofovir  at [facility] outpatient pharmacy and lamuvidine at [facility] received prescription for Delstrigo and will order to have ready by 2pm 5/21.    Kailin Principato, PharmD, BCPS, BCIDP Work Cell: [phone] 11/18/2023 4:08 PM

## 2023-11-18 NOTE — Progress Notes (Signed)
   11/18/23 0400  Psych Admission Type (Psych Patients Only)  Admission Status Involuntary  Psychosocial Assessment  Patient Complaints Anxiety;Depression;Other (Comment)   Eye Contact Fair;Watchful  Facial Expression Anxious  Affect Appropriate to circumstance  Speech Logical/coherent  Interaction Assertive  Motor Activity Slow  Appearance/Hygiene In scrubs  Behavior Characteristics Cooperative;Appropriate to situation  Mood Pleasant  Aggressive Behavior  Effect No apparent injury  Thought Process  Coherency WDL  Content WDL  Delusions None reported or observed  Perception None reported   Hallucination None reported  Judgment WDL  Confusion None  Danger to Self  Current suicidal ideation? Denies  Self-Injurious Behavior No self-injurious ideation or behavior indicators observed or expressed   Agreement Not to Harm Self Yes  Description of Agreement Verbal  Danger to Others  Danger to Others None reported or observed  Danger to Others Abnormal  Harmful Behavior to others No threats or harm toward other people  Destructive Behavior No threats or harm toward property

## 2023-11-18 NOTE — Progress Notes (Signed)
   11/18/23 0807  Charting Type  Charting Type Shift assessment  Safety Check Verification  Has the RN verified the 15 minute safety check completion? Yes  Neurological  Neuro (WDL) WDL  HEENT  HEENT (WDL) X  Teeth Missing (Comment)  Tongue Pink;Moist  Mucous Membrane(s) Pink;Moist  Voice Clear  Respiratory  Respiratory (WDL) WDL  Cardiac  Cardiac (WDL) WDL  Vascular  Vascular (WDL) WDL  Integumentary  Integumentary (WDL) WDL  Braden Scale (Ages 8 and up)  Sensory Perceptions 4  Moisture 4  Activity 4  Mobility 4  Nutrition 3  Friction and Shear 3  Braden Scale Score 22  Musculoskeletal  Musculoskeletal (WDL) WDL  Assistive Device None  Gastrointestinal  Gastrointestinal (WDL) WDL  Last BM Date  11/17/23 (per patient)  GU Assessment  Genitourinary (WDL) WDL  Neurological  Level of Consciousness Alert
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
## 2023-11-18 NOTE — Group Note (Signed)
 Date:  11/18/2023 Time:  11:11 AM  Group Topic/Focus:  Goals Group:   The focus of this group is to help patients establish daily goals to achieve during treatment and discuss how the patient can incorporate goal setting into their daily lives to aide in recovery.    Participation Level:  Active  Participation Quality:  Appropriate  Affect:  Appropriate  Cognitive:  Appropriate  Insight: Appropriate  Engagement in Group:  Engaged  Modes of Intervention:  Discussion, Education, and Support  Additional Comments:    Laverne Potter 11/18/2023, 11:11 AM

## 2023-11-18 NOTE — BHH Suicide Risk Assessment (Signed)
 [facility] Discharge Suicide Risk Assessment   Principal Problem: MDD (major depressive disorder), recurrent episode, severe (HCC) Discharge Diagnoses: Principal Problem:   MDD (major depressive disorder), recurrent episode, severe (HCC) Active Problems:   Undifferentiated schizophrenia (HCC)   Total Time spent with patient: 30 minutes  Musculoskeletal: Strength & Muscle Tone: within normal limits Gait & Station: normal Patient leans: N/A  Psychiatric Specialty Exam  Presentation  General Appearance:  Appropriate for Environment  Eye Contact: Good  Speech: Clear and Coherent; Normal Rate  Speech Volume: Normal  Handedness: Right   Mood and Affect  Mood: Euthymic  Duration of Depression Symptoms: Greater than two weeks  Affect: Appropriate; Congruent   Thought Process  Thought Processes: Coherent; Goal Directed; Linear  Descriptions of Associations:Intact  Orientation:Full (Time, Place and Person)  Thought Content:Logical  History of Schizophrenia/Schizoaffective disorder:No  Duration of Psychotic Symptoms:No data recorded Hallucinations:Hallucinations: None  Ideas of Reference:None  Suicidal Thoughts:Suicidal Thoughts: No  Homicidal Thoughts:Homicidal Thoughts: No   Sensorium  Memory: Immediate Good; Recent Good  Judgment: Fair  Insight: Fair   Art therapist  Concentration: Fair  Attention Span: Fair  Recall: Fair  Fund of Knowledge: Good  Language: Fair   Psychomotor Activity  Psychomotor Activity: Psychomotor Activity: Normal   Assets  Assets: Communication Skills; Desire for Improvement; Social Support; Resilience; Housing   Sleep  Sleep: Sleep: Good   Physical Exam: Physical Exam Neurological:     Mental Status: He is alert and oriented to person, place, and time. Mental status is at baseline.  Psychiatric:        Mood and Affect: Mood normal.        Behavior: Behavior normal.        Thought  Content: Thought content normal.        Judgment: Judgment normal.    Review of Systems  Psychiatric/Behavioral:  Negative for hallucinations and suicidal ideas. The patient is not nervous/anxious.    Blood pressure 127/67, pulse (!) 59, temperature 98 F (36.7 C), temperature source Oral, resp. rate 19, height 5\' 2"  (1.575 m), weight 67.1 kg, SpO2 98%. Body mass index is 27.07 kg/m.  Mental Status Per Nursing Assessment::   On Admission:  Suicidal ideation indicated by patient, Belief that plan would result in death  Demographic Factors:  Male and Low socioeconomic status  Loss Factors: NA  Historical Factors: Prior suicide attempts and Impulsivity  Risk Reduction Factors:   Living with another person, especially a relative, Positive social support, Positive therapeutic relationship, and Positive coping skills or problem solving skills  Continued Clinical Symptoms:  Previous Psychiatric Diagnoses and Treatments  Cognitive Features That Contribute To Risk:  None    Suicide Risk:  Minimal: No identifiable suicidal ideation.  Patients presenting with no risk factors but with morbid ruminations; may be classified as minimal risk based on the severity of the depressive symptoms   Follow-up Information     [facility] Follow up.   Why: Appointment is scheduled for 11/24/2023 at 10:00.  Appointment is in person. Contact information: [address] [place] [address] [phone]         [facility]. Go to.   Why: ACTT referral  has been sent. The ACTT team lead will follow up with you at discharge. Contact information: [address] [place] [address] [phone]                 Plan Of Care/Follow-up recommendations:  # It is recommended  to the patient to continue psychiatric medications as prescribed, after discharge from the hospital.   # It is recommended to the patient to follow up with your outpatient  psychiatric provider and PCP. # It was discussed with the patient, the impact of alcohol, drugs, tobacco have been there overall psychiatric and medical wellbeing, and total abstinence from substance use was recommended. # Prescriptions provided or sent directly to preferred pharmacy at discharge. Patient agreeable to plan. Given the opportunity to ask questions. Appears to feel comfortable with discharge.  # In the event of worsening symptoms, the patient is instructed to call the crisis hotline (988), 911 and or go to the nearest ED for appropriate evaluation and treatment of symptoms. To follow-up with primary care provider for other medical issues, concerns and or health care needs # Patient was discharged home as requested with a plan to follow up as noted above.    Fay Hoop, PA-C 11/18/2023, 12:12 PM

## 2023-11-18 NOTE — Group Note (Signed)
 [facility] LCSW Group Therapy Note   Group Date: 11/18/2023 Start Time: 1310 End Time: 1400  Type of Therapy/Topic:  Group Therapy:  Feelings about Diagnosis  Participation Level:  None   Description of Group:    This group will allow patients to explore their thoughts and feelings about diagnoses they have received. Patients will be guided to explore their level of understanding and acceptance of these diagnoses. Facilitator will encourage patients to process their thoughts and feelings about the reactions of others to their diagnosis, and will guide patients in identifying ways to discuss their diagnosis with significant others in their lives. This group will be process-oriented, with patients participating in exploration of their own experiences as well as giving and receiving support and challenge from other group members.   Therapeutic Goals: 1. Patient will demonstrate understanding of diagnosis as evidence by identifying two or more symptoms of the disorder:  2. Patient will be able to express two feelings regarding the diagnosis 3. Patient will demonstrate ability to communicate their needs through discussion and/or role plays  Summary of Patient Progress: Patient arrived late to group. Patient did not participate in group discussion.    Therapeutic Modalities:   Cognitive Behavioral Therapy Brief Therapy Feelings Identification    Larri Ply, LCSW

## 2023-11-18 NOTE — Group Note (Signed)
 Recreation Therapy Group Note   Group Topic:Other  Group Date: 11/18/2023 Start Time: 0945 End Time: 1055 Facilitators: Deatrice Factor, LRT, CTRS Location: Courtyard  Group Description: [employer]. LRT and patients played games of basketball, drew with chalk, and played corn hole while outside in the courtyard while getting fresh air and sunlight. Music was being played in the background. LRT and peers conversed about different games they have played before, what they do in their free time and anything else that is on their minds. LRT encouraged pts to drink water  after being outside, sweating and getting their heart rate up.  Goal Area(s) Addressed: Patient will build on frustration tolerance skills. Patients will partake in a competitive play game with peers. Patients will gain knowledge of new leisure interest/hobby.    Affect/Mood: Appropriate   Participation Level: Active and Engaged   Participation Quality: Independent   Behavior: Appropriate, Calm, and Cooperative   Speech/Thought Process: Coherent   Insight: Good   Judgement: Good   Modes of Intervention: Activity and Rapport Building   Patient Response to Interventions:  Attentive, Engaged, Interested , and Receptive   Education Outcome:  Acknowledges education   Clinical Observations/Individualized Feedback: Victorhugo was active in their participation of session activities and group discussion. Pt interacted well with LRT and peers duration of session.    Plan: Continue to engage patient in RT group sessions 2-3x/week.   Deatrice Factor, LRT, CTRS 11/18/2023 11:02 AM

## 2023-11-18 NOTE — Plan of Care (Signed)
  Problem: Education: Goal: Emotional status will improve Outcome: Progressing   Problem: Education: Goal: Mental status will improve Outcome: Progressing   Problem: Activity: Goal: Interest or engagement in activities will improve Outcome: Progressing   Problem: Activity: Goal: Sleeping patterns will improve Outcome: Progressing   Problem: Coping: Goal: Ability to verbalize frustrations and anger appropriately will improve Outcome: Progressing   Problem: Activity: Goal: Interest or engagement in leisure activities will improve Outcome: Progressing

## 2023-11-18 NOTE — Progress Notes (Signed)
 Patient ID: Charles Charles, male   DOB: August 03, 1960, 63 y.o.   MRN: [id number] Discharge Note:  Patient denies SI/HI/AVH at this time. Discharge instructions, AVS, prescriptions, and transition record gone over with patient. Patient declined to complete suicide safety plan. Patient agrees to comply with medication management, follow-up visit, and outpatient therapy. Patient belongings returned to patient. Patient questions and concerns addressed and answered. Patient ambulatory off unit. Patient discharged to home via [employer] [facility].

## 2023-11-18 NOTE — Progress Notes (Addendum)
 Patient's goal for today is to, "continue to take my medications."  11/18/23 0807  Psych Admission Type (Psych Patients Only)  Admission Status Involuntary  Psychosocial Assessment  Patient Complaints Sleep disturbance ("Patient stated he had sleep disturbance due to his door being opened during the MHT rounds.")  Eye Contact Fair  Facial Expression Flat;Sad  Affect Flat  Speech Logical/coherent  Interaction Minimal;Guarded  Motor Activity Slow  Appearance/Hygiene In scrubs  Behavior Characteristics Cooperative  Mood Pleasant  Aggressive Behavior  Effect No apparent injury  Thought Process  Coherency WDL  Content WDL  Delusions None reported or observed  Perception WDL  Hallucination None reported or observed  Judgment WDL  Confusion None  Danger to Self  Current suicidal ideation? Denies  Self-Injurious Behavior No self-injurious ideation or behavior indicators observed or expressed   Agreement Not to Harm Self Yes  Description of Agreement verbal  Danger to Others  Danger to Others Reported or observed  Danger to Others Abnormal  Harmful Behavior to others No threats or harm toward other people  Destructive Behavior No threats or harm toward property

## 2023-11-18 NOTE — Plan of Care (Signed)
  Problem: Education: Goal: Knowledge of [facility] Education information/materials will improve Outcome: Progressing Goal: Emotional status will improve Outcome: Progressing Goal: Mental status will improve Outcome: Progressing Goal: Verbalization of understanding the information provided will improve Outcome: Progressing   Problem: Activity: Goal: Interest or engagement in activities will improve Outcome: Progressing   Problem: Coping: Goal: Ability to verbalize frustrations and anger appropriately will improve Outcome: Progressing Goal: Ability to demonstrate self-control will improve Outcome: Progressing   Problem: Health Behavior/Discharge Planning: Goal: Identification of resources available to assist in meeting health care needs will improve Outcome: Progressing Goal: Compliance with treatment plan for underlying cause of condition will improve Outcome: Progressing   Problem: Physical Regulation: Goal: Ability to maintain clinical measurements within normal limits will improve Outcome: Progressing   Problem: Safety: Goal: Periods of time without injury will increase Outcome: Progressing   Problem: Education: Goal: Utilization of techniques to improve thought processes will improve Outcome: Progressing Goal: Knowledge of the prescribed therapeutic regimen will improve Outcome: Progressing   Problem: Activity: Goal: Interest or engagement in leisure activities will improve Outcome: Progressing Goal: Imbalance in normal sleep/wake cycle will improve Outcome: Progressing   Problem: Coping: Goal: Coping ability will improve Outcome: Progressing Goal: Will verbalize feelings Outcome: Progressing   Problem: Health Behavior/Discharge Planning: Goal: Ability to make decisions will improve Outcome: Progressing Goal: Compliance with therapeutic regimen will improve Outcome: Progressing   Problem: Role Relationship: Goal: Will demonstrate positive  changes in social behaviors and relationships Outcome: Progressing   Problem: Safety: Goal: Ability to disclose and discuss suicidal ideas will improve Outcome: Progressing Goal: Ability to identify and utilize support systems that promote safety will improve Outcome: Progressing   Problem: Self-Concept: Goal: Will verbalize positive feelings about self Outcome: Progressing Goal: Level of anxiety will decrease Outcome: Progressing   Problem: Education: Goal: Ability to make informed decisions regarding treatment will improve Outcome: Progressing   Problem: Coping: Goal: Coping ability will improve Outcome: Progressing   Problem: Health Behavior/Discharge Planning: Goal: Identification of resources available to assist in meeting health care needs will improve Outcome: Progressing   Problem: Medication: Goal: Compliance with prescribed medication regimen will improve Outcome: Progressing   Problem: Self-Concept: Goal: Ability to disclose and discuss suicidal ideas will improve Outcome: Progressing Goal: Will verbalize positive feelings about self Outcome: Progressing   Problem: Education: Goal: Utilization of techniques to improve thought processes will improve Outcome: Progressing Goal: Knowledge of the prescribed therapeutic regimen will improve Outcome: Progressing   Problem: Activity: Goal: Interest or engagement in leisure activities will improve Outcome: Progressing Goal: Imbalance in normal sleep/wake cycle will improve Outcome: Progressing   Problem: Coping: Goal: Coping ability will improve Outcome: Progressing Goal: Will verbalize feelings Outcome: Progressing   Problem: Health Behavior/Discharge Planning: Goal: Ability to make decisions will improve Outcome: Progressing Goal: Compliance with therapeutic regimen will improve Outcome: Progressing   Problem: Role Relationship: Goal: Will demonstrate positive changes in social behaviors and  relationships Outcome: Progressing   Problem: Safety: Goal: Ability to disclose and discuss suicidal ideas will improve Outcome: Progressing Goal: Ability to identify and utilize support systems that promote safety will improve Outcome: Progressing   Problem: Self-Concept: Goal: Will verbalize positive feelings about self Outcome: Progressing Goal: Level of anxiety will decrease Outcome: Progressing

## 2023-11-18 NOTE — Group Note (Signed)
 Recreation Therapy Group Note   Group Topic:Health and Wellness  Group Date: 11/18/2023 Start Time: 1450 End Time: 1535 Facilitators: Deatrice Factor, LRT, CTRS Location: Craft Room  Activity Description/Intervention: Therapeutic Drumming. Patients with peers and staff were given the opportunity to engage in a leader facilitated [facility] Group Empowerment Drumming Circle with staff from the [employer], in partnership with [employer]. Teaching laboratory technician and trained [employer], Kathlyne Parchment leading with LRT observing and documenting intervention and pt response. This evidenced-based practice targets 7 areas of health and wellbeing in the human experience including: stress-reduction, exercise, self-expression, camaraderie/support, nurturing, spirituality, and music-making (leisure).    Goal Area(s) Addresses:  Patient will engage in pro-social way in music group.  Patient will follow directions of drum leader on the first prompt. Patient will demonstrate no behavioral issues during group.  Patient will identify if a reduction in stress level occurs as a result of participation in therapeutic drum circle.     Education: Leisure exposure, Pharmacologist, Musical expression, Discharge Planning   Affect/Mood: Appropriate   Participation Level: Active and Engaged   Participation Quality: Independent   Behavior: Appropriate, Calm, and Cooperative   Speech/Thought Process: Coherent   Insight: Good   Judgement: Good   Modes of Intervention: Music   Patient Response to Interventions:  Receptive   Education Outcome:  Acknowledges education   Clinical Observations/Individualized Feedback: Charles Charles was active in their participation of session activities and group discussion. Pt interacted well with LRT and peers duration of session.    Plan: Continue to engage patient in RT group sessions 2-3x/week.   Deatrice Factor, LRT, CTRS 11/18/2023 5:09 PM

## 2023-11-20 NOTE — BHH Counselor (Signed)
 [facility] contacted CSW to make aware that patient's upcoming therapy and medication management appointment previously scheduled for 11/24/23 has been cancelled due to the organization being closed for the [facility] day holiday.   CSW attempted to make contact with patient to make aware of the changes in his previously scheduled outpatient therapy appointment.   All numbers in patient's chart are not in service.   Patient's new therapy appointment has been moved to May 28th at 10 AM with [facility] due to holiday closings.   Elinore Shults, MSW, LCSWA 11/20/2023 3:44 PM

## 2023-12-11 ENCOUNTER — Ambulatory Visit: Payer: Self-pay

## 2023-12-11 NOTE — Telephone Encounter (Signed)
 FYI Only or Action Required?: FYI only for provider  Patient was last seen in primary care on unknown. Called Nurse Triage reporting Medication Refill. Symptoms began no symptoms reported. Interventions attempted: Other: N/A. Symptoms are: N/A.  Triage Disposition: Call PCP When Office is Open  Patient/caregiver understands and will follow disposition?:                                   Copied From CRM [phone]. Reason for Triage: Patient is trying to get medication renewed that he received at the emergency room, patient does not have a PCP.  Please advise.  Reason for Disposition  [1] Prescription refill request for NON-ESSENTIAL medicine (i.e., no harm to patient if med not taken) AND [2] triager unable to refill per department policy  Answer Assessment - Initial Assessment Questions Patient called in to request refills of his medications. Patient stated medications were prescribed by a [facility] provider. This RN advised patient to contact provider who originally prescribed medications. This RN assisted patient with scheduling a NP appointment for medication management in the future.  Protocols used: Medication Refill and Renewal Call-A-AH

## 2024-01-23 ENCOUNTER — Ambulatory Visit: Payer: MEDICAID | Admitting: Internal Medicine

## 2024-01-29 ENCOUNTER — Ambulatory Visit: Payer: MEDICAID | Admitting: Physician Assistant

## 2024-02-03 ENCOUNTER — Ambulatory Visit: Payer: MEDICAID | Admitting: Family Medicine

## 2024-02-03 VITALS — BP 114/68 | HR 62 | Temp 98.3°F | Resp 16 | Ht 67.0 in | Wt 163.3 lb

## 2024-02-03 DIAGNOSIS — Z719 Counseling, unspecified: Secondary | ICD-10-CM

## 2024-02-03 NOTE — Progress Notes (Signed)
 Nursing Intake Note - [facility]  Chief Complaint: No chief complaint on file.   Living Situation: Unhoused     New Patient Status:  [x]  New to [facility]  Privacy practices reviewed  HIPAA form signed and documented  Consent for digital charting: []  Signed []  Not Signed  Additional Notes:  Vital signs taken and entered in flowsheet  Interpreter services: []  Needed [x]  Not Needed  Patient oriented to mobile clinic services and process  SDOH screening completed  Referral to provider: []  Needed [x]  Not Needed  RN Interventions Provided:  [x]  Health education (e.g., chronic disease, hygiene, nutrition)  []  Wound care performed  []  Flu vaccine administered  [x]  Other: Medication Assistance

## 2024-02-09 ENCOUNTER — Encounter: Payer: Self-pay | Admitting: Family Medicine

## 2024-02-09 NOTE — Progress Notes (Signed)
 Cosigned Annabella Eagles, NP

## 2024-02-16 ENCOUNTER — Other Ambulatory Visit (HOSPITAL_COMMUNITY): Payer: Self-pay

## 2024-02-16 ENCOUNTER — Telehealth: Payer: Self-pay

## 2024-02-16 NOTE — Telephone Encounter (Signed)
 Pharmacy Patient Advocate Encounter  Insurance verification completed.   The patient is insured through [employer] [place] [employer]   Ran test claim for Delstrigo . Currently a quantity of 30 is a 30 day supply and was last filled on 02/06/24 next fill date is 02/29/24 .   This test claim was processed through [facility]- copay amounts may vary at other pharmacies due to pharmacy/plan contracts, or as the patient moves through the different stages of their insurance plan.

## 2024-02-24 ENCOUNTER — Encounter: Payer: Self-pay | Admitting: Internal Medicine

## 2024-02-24 ENCOUNTER — Ambulatory Visit (INDEPENDENT_AMBULATORY_CARE_PROVIDER_SITE_OTHER): Payer: MEDICAID | Admitting: Internal Medicine

## 2024-02-24 ENCOUNTER — Other Ambulatory Visit: Payer: Self-pay

## 2024-02-24 VITALS — BP 117/74 | HR 62 | Temp 97.6°F | Ht 66.0 in | Wt 161.0 lb

## 2024-02-24 DIAGNOSIS — F1910 Other psychoactive substance abuse, uncomplicated: Secondary | ICD-10-CM

## 2024-02-24 DIAGNOSIS — B20 Human immunodeficiency virus [HIV] disease: Secondary | ICD-10-CM | POA: Diagnosis not present

## 2024-02-24 DIAGNOSIS — Z720 Tobacco use: Secondary | ICD-10-CM

## 2024-02-24 MED ORDER — DELSTRIGO 100-300-300 MG PO TABS
1.0000 | ORAL_TABLET | Freq: Every day | ORAL | 3 refills | Status: AC
Start: 2024-02-24 — End: ?

## 2024-02-24 NOTE — Progress Notes (Signed)
 [facility]     HPI: Charles Charles is a 63 y.o. male presents for HIV management.Presents to establish care. He was previously seen at [facility] last seen 2023. Then seen by [facility], seen by ID inpatient in May 2025. NO missed doses of ART PMHx of HIV, treated syphillis, polysubstance abuse, CVA, piror psych admission, prior suicide attempts, shizophrenia  Date of diagnosis  Does not recall exact date/year, [facility] note 2021 he was on delstrigo  at that time ART exposure Past Ois: no Risk factors: MSM, Last sexually active about a year ago. HE states he cannot recall due to CVA.  Social: Occupation:  Housing: Homeless, Environmental manager Support: not much Understanding of HIV: Etoh/drug/tobacco use: Yes/yes->cocaine(smoke)/yes Past Medical History:  Diagnosis Date   Alcohol abuse 01/18/2021   Alcohol use disorder, moderate, dependence (HCC) 04/10/2016   Cannabis use disorder, moderate, dependence (HCC) 05/06/2016   COPD (chronic obstructive pulmonary disease) (HCC) 12/03/2022   COPD exacerbation (HCC) 12/03/2022   Drug overdose 01/05/2022   GERD (gastroesophageal reflux disease)    Headache    History of syphilis 12/03/2022   Neutropenia (HCC) 12/03/2022   Schizo-affective schizophrenia (HCC)    Suicidal ideation 04/09/2016    Past Surgical History:  Procedure Laterality Date   COLONOSCOPY WITH PROPOFOL  N/A 08/04/2015   Procedure: COLONOSCOPY WITH PROPOFOL ;  Surgeon: Rogelia Copping, MD;  [facility];  Service: Endoscopy;  Laterality: N/A;   INCISION AND DRAINAGE PERIRECTAL ABSCESS N/A 07/14/2015   Procedure: IRRIGATION AND DEBRIDEMENT PERIRECTAL ABSCESS;  Surgeon: Charlie FORBES Fell, MD;  Location: [facility];  Service: General;  Laterality: N/A;   INCISION AND DRAINAGE PERIRECTAL ABSCESS N/A 08/14/2015   Procedure: IRRIGATION AND DEBRIDEMENT PERIRECTAL ABSCESS;  Surgeon: Dorothyann LITTIE Husk, MD;  Location: [facility];  Service:  General;  Laterality: N/A;    Family History  Problem Relation Age of Onset   Anxiety disorder Mother    Diabetes Mother    Diabetes Father    Current Outpatient Medications on File Prior to Visit  Medication Sig Dispense Refill   doravirin-lamivudin-tenofov df (DELSTRIGO ) 100-300-300 MG TABS per tablet Take 1 tablet by mouth daily. 30 tablet 0   hydrOXYzine  (ATARAX ) 25 MG tablet Take 1 tablet (25 mg total) by mouth 3 (three) times daily as needed for anxiety. (Patient not taking: Reported on 02/03/2024) 30 tablet 0   OLANZapine  (ZYPREXA ) 15 MG tablet Take 1 tablet (15 mg total) by mouth at bedtime. (Patient not taking: Reported on 02/03/2024) 30 tablet 0   pantoprazole  (PROTONIX ) 20 MG tablet Take 1 tablet (20 mg total) by mouth daily. (Patient not taking: Reported on 02/03/2024) 30 tablet 0   sertraline  (ZOLOFT ) 50 MG tablet Take 1 tablet (50 mg total) by mouth daily. 30 tablet 0   No current facility-administered medications on file prior to visit.    Allergies  Allergen Reactions   Penicillins Anaphylaxis, Hives and Other (See Comments)    Has patient had a PCN reaction causing immediate rash, facial/tongue/throat swelling, SOB or lightheadedness with hypotension: Yes Has patient had a PCN reaction causing severe rash involving mucus membranes or skin necrosis: No Has patient had a PCN reaction that required hospitalization No Has patient had a PCN reaction occurring within the last 10 years: Yes If all of the above answers are NO, then may proceed with Cephalosporin use. Other reaction(s): UNKNOWN Has patient had a PCN reaction causing immediate rash, facial/tongue/throat swelling, SOB or lightheadedness  with hypotension: Yes Has patient had a PCN reaction causing severe rash involving mucus membranes or skin necrosis: No Has patient had a PCN reaction that required hospitalization No Has patient had a PCN reaction occurring within the last 10 years: Yes If all of the above answers  are NO, then may proceed with Cephalosporin use.   Sulfa  Antibiotics Itching      Lab Results HIV 1 RNA Quant (copies/mL)  Date Value  11/10/2023 <20  01/07/2022 460   No results found for: HIV1GENOSEQ Lab Results  Component Value Date   WBC 5.7 11/10/2023   WBC 5.8 11/10/2023   HGB 15.9 11/10/2023   HGB 16.3 11/10/2023   HCT 46.8 11/10/2023   HCT 49.1 11/10/2023   MCV 94.4 11/10/2023   MCV 98 (H) 11/10/2023   PLT 325 11/10/2023   PLT 361 11/10/2023    Lab Results  Component Value Date   CREATININE 0.78 11/10/2023   BUN 12 11/10/2023   NA 147 (H) 11/10/2023   K 3.7 11/10/2023   CL 110 11/10/2023   CO2 26 11/10/2023   Lab Results  Component Value Date   ALT 25 11/10/2023   AST 27 11/10/2023   ALKPHOS 53 11/10/2023   BILITOT 0.6 11/10/2023    Lab Results  Component Value Date   CHOL 167 12/02/2022   TRIG 144 12/02/2022   HDL 47 12/02/2022   LDLCALC 91 12/02/2022   No results found for: HAV No results found for: HEPBSAG, HEPBSAB No results found for: HCVAB No results found for: CHLAMYDIAWP, N No results found for: GCPROBEAPT No results found for: QUANTGOLD  Assessment/Plan #HIV -CD4 1566, VL ND, on 11/10/23 -Coniue Delstirgo -g6pd 7.4 and HLA nb507 nr on 03/07/20 -hiv labs today, declined STI testing -Dental paperwork3 -F/U in 3 months  #Polysubstance abuse - Plans to start substance abuse classes at family services  #Tobacco abuse -Declined path  #Mood Disorder -Referred Psychiatry  #Vaccination->declined COVID Flu Monkeypox PCV Meningitis HepA NR HAV-serology HEpB NR 2021-seorlogy Tdap Shingles  #Health maintenance -Quantiferon todya -RPR 1:1 on 11/14/23-declined -HCV NR 2021-serolgy -GC-declined -Lipid today -Colonoscopy-2017-> repeat due in 5 years. Pt states he will  thick about colonoscopy    Loney Stank, MD [facility] I have personally spent  105 minutes involved in face-to-face and non-face-to-face activities for this patient on the day of the visit. Professional time spent includes the following activities: Preparing to see the patient (review of tests), Obtaining and/or reviewing separately obtained history (admission/discharge record), Performing a medically appropriate examination and/or evaluation , Ordering medications/tests/procedures, referring and communicating with other health care professionals, Documenting clinical information in the EMR, Independently interpreting results (not separately reported), Communicating results to the patient/family/caregiver, Counseling and educating the patient/family/caregiver and Care coordination (not separately reported).

## 2024-02-25 LAB — T-HELPER CELLS (CD4) COUNT (NOT AT ARMC)
CD4 % Helper T Cell: 68 % — ABNORMAL HIGH (ref 33–65)
CD4 T Cell Abs: 1340 /uL (ref 400–1790)

## 2024-02-26 LAB — CBC WITH DIFFERENTIAL/PLATELET
Absolute Lymphocytes: 2331 {cells}/uL (ref 850–3900)
Absolute Monocytes: 432 {cells}/uL (ref 200–950)
Basophils Absolute: 41 {cells}/uL (ref 0–200)
Basophils Relative: 0.9 %
Eosinophils Absolute: 122 {cells}/uL (ref 15–500)
Eosinophils Relative: 2.7 %
HCT: 47.4 % (ref 38.5–50.0)
Hemoglobin: 15.6 g/dL (ref 13.2–17.1)
MCH: 32.8 pg (ref 27.0–33.0)
MCHC: 32.9 g/dL (ref 32.0–36.0)
MCV: 99.8 fL (ref 80.0–100.0)
MPV: 9.4 fL (ref 7.5–12.5)
Monocytes Relative: 9.6 %
Neutro Abs: 1575 {cells}/uL (ref 1500–7800)
Neutrophils Relative %: 35 %
Platelets: 392 Thousand/uL (ref 140–400)
RBC: 4.75 Million/uL (ref 4.20–5.80)
RDW: 13.8 % (ref 11.0–15.0)
Total Lymphocyte: 51.8 %
WBC: 4.5 Thousand/uL (ref 3.8–10.8)

## 2024-02-26 LAB — QUANTIFERON-TB GOLD PLUS
Mitogen-NIL: 6.45 [IU]/mL
NIL: 0.04 [IU]/mL
QuantiFERON-TB Gold Plus: NEGATIVE
TB1-NIL: <0 [IU]/mL
TB2-NIL: 0 [IU]/mL

## 2024-02-26 LAB — COMPLETE METABOLIC PANEL WITHOUT GFR
AG Ratio: 1.7 (calc) (ref 1.0–2.5)
ALT: 16 U/L (ref 9–46)
AST: 19 U/L (ref 10–35)
Albumin: 4.6 g/dL (ref 3.6–5.1)
Alkaline phosphatase (APISO): 50 U/L (ref 35–144)
BUN: 10 mg/dL (ref 7–25)
CO2: 28 mmol/L (ref 20–32)
Calcium: 9.6 mg/dL (ref 8.6–10.3)
Chloride: 104 mmol/L (ref 98–110)
Creat: 0.87 mg/dL (ref 0.70–1.35)
Globulin: 2.7 g/dL (ref 1.9–3.7)
Glucose, Bld: 88 mg/dL (ref 65–99)
Potassium: 4.4 mmol/L (ref 3.5–5.3)
Sodium: 140 mmol/L (ref 135–146)
Total Bilirubin: 0.3 mg/dL (ref 0.2–1.2)
Total Protein: 7.3 g/dL (ref 6.1–8.1)

## 2024-02-26 LAB — HEPATITIS B CORE ANTIBODY, TOTAL: Hep B Core Total Ab: REACTIVE — AB

## 2024-02-26 LAB — LIPID PANEL
Cholesterol: 178 mg/dL (ref <200)
HDL: 63 mg/dL (ref >=40)
LDL Cholesterol (Calc): 91 mg/dL
Non-HDL Cholesterol (Calc): 115 mg/dL (ref <130)
Total CHOL/HDL Ratio: 2.8 (calc) (ref <5.0)
Triglycerides: 141 mg/dL (ref <150)

## 2024-02-26 LAB — HEPATITIS A ANTIBODY, TOTAL: Hepatitis A AB,Total: NONREACTIVE

## 2024-02-26 LAB — HEPATITIS C ANTIBODY: Hepatitis C Ab: NONREACTIVE

## 2024-02-26 LAB — HIV-1 RNA QUANT-NO REFLEX-BLD
HIV 1 RNA Quant: NOT DETECTED {copies}/mL
HIV-1 RNA Quant, Log: NOT DETECTED {Log_copies}/mL

## 2024-03-11 ENCOUNTER — Other Ambulatory Visit: Payer: Self-pay

## 2024-03-11 ENCOUNTER — Inpatient Hospital Stay
Admission: AD | Admit: 2024-03-11 | Discharge: 2024-03-23 | DRG: 885 | Disposition: A | Discharge status: Home / Self Care | Payer: MEDICAID | Source: Intra-hospital | Attending: Psychiatry | Admitting: Psychiatry

## 2024-03-11 ENCOUNTER — Encounter: Payer: Self-pay | Admitting: Psychiatry

## 2024-03-11 ENCOUNTER — Ambulatory Visit (HOSPITAL_COMMUNITY)
Admission: EM | Admit: 2024-03-11 | Discharge: 2024-03-11 | Disposition: A | Discharge status: Other Acute Inpatient Hospital | Payer: MEDICAID | Attending: Psychiatry | Admitting: Psychiatry

## 2024-03-11 DIAGNOSIS — Z555 Less than a high school diploma: Secondary | ICD-10-CM

## 2024-03-11 DIAGNOSIS — F19951 Other psychoactive substance use, unspecified with psychoactive substance-induced psychotic disorder with hallucinations: Secondary | ICD-10-CM | POA: Diagnosis present

## 2024-03-11 DIAGNOSIS — Z5982 Transportation insecurity: Secondary | ICD-10-CM

## 2024-03-11 DIAGNOSIS — Z5941 Food insecurity: Secondary | ICD-10-CM | POA: Diagnosis not present

## 2024-03-11 DIAGNOSIS — Z79899 Other long term (current) drug therapy: Secondary | ICD-10-CM | POA: Insufficient documentation

## 2024-03-11 DIAGNOSIS — Z9151 Personal history of suicidal behavior: Secondary | ICD-10-CM | POA: Diagnosis not present

## 2024-03-11 DIAGNOSIS — Z21 Asymptomatic human immunodeficiency virus [HIV] infection status: Secondary | ICD-10-CM | POA: Diagnosis present

## 2024-03-11 DIAGNOSIS — Z818 Family history of other mental and behavioral disorders: Secondary | ICD-10-CM

## 2024-03-11 DIAGNOSIS — Z9152 Personal history of nonsuicidal self-harm: Secondary | ICD-10-CM

## 2024-03-11 DIAGNOSIS — Z833 Family history of diabetes mellitus: Secondary | ICD-10-CM | POA: Diagnosis not present

## 2024-03-11 DIAGNOSIS — F17210 Nicotine dependence, cigarettes, uncomplicated: Secondary | ICD-10-CM | POA: Diagnosis present

## 2024-03-11 DIAGNOSIS — Z5986 Financial insecurity: Secondary | ICD-10-CM | POA: Diagnosis not present

## 2024-03-11 DIAGNOSIS — F1490 Cocaine use, unspecified, uncomplicated: Secondary | ICD-10-CM | POA: Diagnosis present

## 2024-03-11 DIAGNOSIS — R45851 Suicidal ideations: Secondary | ICD-10-CM | POA: Diagnosis present

## 2024-03-11 DIAGNOSIS — Z91148 Patient's other noncompliance with medication regimen for other reason: Secondary | ICD-10-CM

## 2024-03-11 DIAGNOSIS — F419 Anxiety disorder, unspecified: Secondary | ICD-10-CM | POA: Diagnosis present

## 2024-03-11 DIAGNOSIS — F259 Schizoaffective disorder, unspecified: Secondary | ICD-10-CM | POA: Diagnosis present

## 2024-03-11 DIAGNOSIS — F1090 Alcohol use, unspecified, uncomplicated: Secondary | ICD-10-CM | POA: Diagnosis present

## 2024-03-11 DIAGNOSIS — K219 Gastro-esophageal reflux disease without esophagitis: Secondary | ICD-10-CM | POA: Insufficient documentation

## 2024-03-11 DIAGNOSIS — Z5900 Homelessness unspecified: Secondary | ICD-10-CM

## 2024-03-11 DIAGNOSIS — F1290 Cannabis use, unspecified, uncomplicated: Secondary | ICD-10-CM | POA: Diagnosis present

## 2024-03-11 DIAGNOSIS — F1410 Cocaine abuse, uncomplicated: Secondary | ICD-10-CM

## 2024-03-11 DIAGNOSIS — F32A Depression, unspecified: Secondary | ICD-10-CM | POA: Diagnosis present

## 2024-03-11 DIAGNOSIS — Z8673 Personal history of transient ischemic attack (TIA), and cerebral infarction without residual deficits: Secondary | ICD-10-CM | POA: Diagnosis not present

## 2024-03-11 DIAGNOSIS — J449 Chronic obstructive pulmonary disease, unspecified: Secondary | ICD-10-CM | POA: Diagnosis present

## 2024-03-11 DIAGNOSIS — F251 Schizoaffective disorder, depressive type: Secondary | ICD-10-CM | POA: Diagnosis not present

## 2024-03-11 DIAGNOSIS — E039 Hypothyroidism, unspecified: Secondary | ICD-10-CM | POA: Insufficient documentation

## 2024-03-11 LAB — LIPID PANEL
Cholesterol: 166 mg/dL (ref 0–200)
HDL: 48 mg/dL (ref >40)
LDL Cholesterol: 98 mg/dL (ref 0–99)
Total CHOL/HDL Ratio: 3.5 ratio
Triglycerides: 102 mg/dL (ref <150)
VLDL: 20 mg/dL (ref 0–40)

## 2024-03-11 LAB — CBC WITH DIFFERENTIAL/PLATELET
Abs Immature Granulocytes: 0.01 K/uL (ref 0.00–0.07)
Basophils Absolute: 0.1 K/uL (ref 0.0–0.1)
Basophils Relative: 1 %
Eosinophils Absolute: 0.3 K/uL (ref 0.0–0.5)
Eosinophils Relative: 7 %
HCT: 41.4 % (ref 39.0–52.0)
Hemoglobin: 14.4 g/dL (ref 13.0–17.0)
Immature Granulocytes: 0 %
Lymphocytes Relative: 52 %
Lymphs Abs: 2.2 K/uL (ref 0.7–4.0)
MCH: 32.8 pg (ref 26.0–34.0)
MCHC: 34.8 g/dL (ref 30.0–36.0)
MCV: 94.3 fL (ref 80.0–100.0)
Monocytes Absolute: 0.4 K/uL (ref 0.1–1.0)
Monocytes Relative: 10 %
Neutro Abs: 1.2 K/uL — ABNORMAL LOW (ref 1.7–7.7)
Neutrophils Relative %: 30 %
Platelets: 334 K/uL (ref 150–400)
RBC: 4.39 MIL/uL (ref 4.22–5.81)
RDW: 13.2 % (ref 11.5–15.5)
WBC: 4.2 K/uL (ref 4.0–10.5)
nRBC: 0 % (ref 0.0–0.2)

## 2024-03-11 LAB — URINALYSIS, ROUTINE W REFLEX MICROSCOPIC
Bacteria, UA: NONE SEEN
Bilirubin Urine: NEGATIVE
Glucose, UA: NEGATIVE mg/dL
Ketones, ur: NEGATIVE mg/dL
Nitrite: NEGATIVE
Protein, ur: NEGATIVE mg/dL
Specific Gravity, Urine: 1.008 (ref 1.005–1.030)
pH: 6 (ref 5.0–8.0)

## 2024-03-11 LAB — POCT URINE DRUG SCREEN - MANUAL ENTRY (I-SCREEN)
POC Amphetamine UR: NOT DETECTED
POC Buprenorphine (BUP): NOT DETECTED
POC Cocaine UR: POSITIVE — AB
POC Marijuana UR: POSITIVE — AB
POC Methadone UR: NOT DETECTED
POC Methamphetamine UR: NOT DETECTED
POC Morphine: NOT DETECTED
POC Oxazepam (BZO): NOT DETECTED
POC Oxycodone UR: NOT DETECTED
POC Secobarbital (BAR): NOT DETECTED

## 2024-03-11 LAB — COMPREHENSIVE METABOLIC PANEL WITH GFR
ALT: 15 U/L (ref 0–44)
AST: 19 U/L (ref 15–41)
Albumin: 3 g/dL — ABNORMAL LOW (ref 3.5–5.0)
Alkaline Phosphatase: 48 U/L (ref 38–126)
Anion gap: 12 (ref 5–15)
BUN: 9 mg/dL (ref 8–23)
CO2: 23 mmol/L (ref 22–32)
Calcium: 9.3 mg/dL (ref 8.9–10.3)
Chloride: 108 mmol/L (ref 98–111)
Creatinine, Ser: 1.01 mg/dL (ref 0.61–1.24)
GFR, Estimated: >60 mL/min (ref >60)
Glucose, Bld: 94 mg/dL (ref 70–99)
Potassium: 3.8 mmol/L (ref 3.5–5.1)
Sodium: 143 mmol/L (ref 135–145)
Total Bilirubin: 0.4 mg/dL (ref 0.0–1.2)
Total Protein: 6.5 g/dL (ref 6.5–8.1)

## 2024-03-11 LAB — HEMOGLOBIN A1C
Hgb A1c MFr Bld: 5.7 % — ABNORMAL HIGH (ref 4.8–5.6)
Mean Plasma Glucose: 116.89 mg/dL

## 2024-03-11 LAB — T4, FREE: Free T4: 0.7 ng/dL (ref 0.61–1.12)

## 2024-03-11 LAB — MAGNESIUM: Magnesium: 2.2 mg/dL (ref 1.7–2.4)

## 2024-03-11 LAB — SARS CORONAVIRUS 2 BY RT PCR: SARS Coronavirus 2 by RT PCR: NEGATIVE

## 2024-03-11 LAB — ETHANOL: Alcohol, Ethyl (B): <15 mg/dL (ref <15)

## 2024-03-11 LAB — TSH: TSH: 1.9 u[IU]/mL (ref 0.350–4.500)

## 2024-03-11 MED ORDER — NICOTINE 14 MG/24HR TD PT24
14.0000 mg | MEDICATED_PATCH | Freq: Every day | TRANSDERMAL | Status: DC
  Filled 2024-03-11: qty 1

## 2024-03-11 MED ORDER — OLANZAPINE 5 MG PO TABS: 15.0000 mg | ORAL_TABLET | Freq: Every day | ORAL | Status: DC

## 2024-03-11 MED ORDER — OLANZAPINE 7.5 MG PO TABS: 15.0000 mg | ORAL_TABLET | Freq: Every day | ORAL | Status: DC

## 2024-03-11 MED ORDER — PANTOPRAZOLE SODIUM 20 MG PO TBEC: 20.0000 mg | DELAYED_RELEASE_TABLET | Freq: Every day | ORAL | Status: DC

## 2024-03-11 MED ORDER — MAGNESIUM HYDROXIDE 400 MG/5ML PO SUSP: 30.0000 mL | Freq: Every day | ORAL | Status: DC | PRN

## 2024-03-11 MED ORDER — ALUM & MAG HYDROXIDE-SIMETH 200-200-20 MG/5ML PO SUSP: 30.0000 mL | ORAL | Status: DC | PRN

## 2024-03-11 MED ORDER — ACETAMINOPHEN 325 MG PO TABS: 650.0000 mg | ORAL_TABLET | Freq: Four times a day (QID) | ORAL | Status: DC | PRN

## 2024-03-11 MED ORDER — SERTRALINE HCL 50 MG PO TABS
50.0000 mg | ORAL_TABLET | Freq: Every day | ORAL | Status: DC
  Administered 2024-03-12: 50 mg via ORAL
  Filled 2024-03-11: qty 1

## 2024-03-11 MED ORDER — DORAVIRIN-LAMIVUDIN-TENOFOV DF 100-300-300 MG PO TABS
1.0000 | ORAL_TABLET | Freq: Every day | ORAL | Status: DC
  Administered 2024-03-12 – 2024-03-23 (×12): 1 via ORAL
  Filled 2024-03-11 (×13): qty 1

## 2024-03-11 MED ORDER — OLANZAPINE 5 MG PO TBDP: 5.0000 mg | ORAL_TABLET | Freq: Three times a day (TID) | ORAL | Status: DC | PRN

## 2024-03-11 MED ORDER — ALBUTEROL SULFATE HFA 108 (90 BASE) MCG/ACT IN AERS: 2.0000 | INHALATION_SPRAY | Freq: Four times a day (QID) | RESPIRATORY_TRACT | Status: DC | PRN

## 2024-03-11 MED ORDER — PANTOPRAZOLE SODIUM 20 MG PO TBEC
20.0000 mg | DELAYED_RELEASE_TABLET | Freq: Every day | ORAL | Status: DC
  Administered 2024-03-12 – 2024-03-23 (×12): 20 mg via ORAL
  Filled 2024-03-11 (×13): qty 1

## 2024-03-11 MED ORDER — HYDROXYZINE HCL 25 MG PO TABS: 25.0000 mg | ORAL_TABLET | Freq: Three times a day (TID) | ORAL | Status: DC | PRN

## 2024-03-11 MED ORDER — DIPHENHYDRAMINE HCL 50 MG PO CAPS: 50.0000 mg | ORAL_CAPSULE | Freq: Three times a day (TID) | ORAL | Status: DC | PRN

## 2024-03-11 MED ORDER — LORAZEPAM 2 MG/ML IJ SOLN: 2.0000 mg | Freq: Three times a day (TID) | INTRAMUSCULAR | Status: DC | PRN

## 2024-03-11 MED ORDER — HALOPERIDOL LACTATE 5 MG/ML IJ SOLN: 10.0000 mg | Freq: Three times a day (TID) | INTRAMUSCULAR | Status: DC | PRN

## 2024-03-11 MED ORDER — DORAVIRIN-LAMIVUDIN-TENOFOV DF 100-300-300 MG PO TABS
1.0000 | ORAL_TABLET | Freq: Every day | ORAL | Status: DC
  Administered 2024-03-11: 1 via ORAL

## 2024-03-11 MED ORDER — OLANZAPINE 10 MG IM SOLR: 5.0000 mg | Freq: Three times a day (TID) | INTRAMUSCULAR | Status: DC | PRN

## 2024-03-11 MED ORDER — DIPHENHYDRAMINE HCL 50 MG/ML IJ SOLN: 50.0000 mg | Freq: Three times a day (TID) | INTRAMUSCULAR | Status: DC | PRN

## 2024-03-11 MED ORDER — SERTRALINE HCL 50 MG PO TABS: 50.0000 mg | ORAL_TABLET | Freq: Every day | ORAL | Status: DC

## 2024-03-11 MED ORDER — DORAVIRIN-LAMIVUDIN-TENOFOV DF 100-300-300 MG PO TABS: 1.0000 | ORAL_TABLET | Freq: Every day | ORAL | Status: DC

## 2024-03-11 MED ORDER — ACETAMINOPHEN 325 MG PO TABS
650.0000 mg | ORAL_TABLET | Freq: Four times a day (QID) | ORAL | Status: DC | PRN
  Administered 2024-03-12 – 2024-03-23 (×4): 650 mg via ORAL
  Filled 2024-03-11 (×5): qty 2

## 2024-03-11 MED ORDER — HYDROXYZINE HCL 25 MG PO TABS
25.0000 mg | ORAL_TABLET | Freq: Three times a day (TID) | ORAL | Status: DC | PRN
  Administered 2024-03-11: 25 mg via ORAL
  Filled 2024-03-11: qty 1

## 2024-03-11 MED ORDER — HALOPERIDOL LACTATE 5 MG/ML IJ SOLN: 5.0000 mg | Freq: Three times a day (TID) | INTRAMUSCULAR | Status: DC | PRN

## 2024-03-11 MED ORDER — HALOPERIDOL 5 MG PO TABS: 5.0000 mg | ORAL_TABLET | Freq: Three times a day (TID) | ORAL | Status: DC | PRN

## 2024-03-11 NOTE — ED Notes (Signed)
 Pt admitted to observation unit endorsing CAH to harm self and others. Pt denies plan or intent to harm self. Pt admits to intermittent use of cocaine as well. Calm, cooperative throughout interview process. Pt states, the voice is always telling me something but I try to ignore him. It's getting harder to do so I need some help. Encouragement provided. Skin assessment completed. Oriented to unit. Meal and drink offered. At currrent, pt continue to endorse AVH. Pt verbally contract for safety. Will monitor for safety.

## 2024-03-11 NOTE — ED Notes (Signed)
 Gave patient medication for anxiety and scheduled antiviral. The antiviral is in patient specific bin in med room since this is patient's own meds. Please give back upon discharge/transfer.

## 2024-03-11 NOTE — Tx Team (Signed)
 Initial Treatment Plan 03/11/2024 10:47 PM [name] FMW:[id number]    PATIENT STRESSORS: Financial difficulties   Loss of close relationship with his mother and children.   Medication change or noncompliance   Substance abuse     PATIENT STRENGTHS: Ability for insight  Communication skills    PATIENT IDENTIFIED PROBLEMS: Substance abuse (cocaine and alcohol)  unemployed  Lack of close support systems                 DISCHARGE CRITERIA:  Ability to meet basic life and health needs Adequate post-discharge living arrangements Improved stabilization in mood, thinking, and/or behavior Reduction of life-threatening or endangering symptoms to within safe limits  PRELIMINARY DISCHARGE PLAN: Outpatient therapy  PATIENT/FAMILY INVOLVEMENT: This treatment plan has been presented to and reviewed with the patient, Charles Charles.  The patient and family have been given the opportunity to ask questions and make suggestions.  Cooper LOISE Lent, RN 03/11/2024, 10:47 PM

## 2024-03-11 NOTE — ED Notes (Signed)
 Safe Transport to [employer] requested.
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
## 2024-03-11 NOTE — Progress Notes (Signed)
 Pt uses a combination HIV medication called Delstrigo . Pt medication was being dispensed at [facility] from home supply. Medication accidentally left at [facility]. Contacted nurse there. Medication will be delivered to [facility] by [employer] courier. Will send to pharmacy for dispensing when it arrives.

## 2024-03-11 NOTE — Plan of Care (Signed)
  Problem: Education: Goal: Will be free of psychotic symptoms Outcome: Not Progressing Goal: Knowledge of the prescribed therapeutic regimen will improve Outcome: Not Progressing   Problem: Coping: Goal: Coping ability will improve Outcome: Not Progressing Goal: Will verbalize feelings Outcome: Not Progressing   Problem: Health Behavior/Discharge Planning: Goal: Compliance with prescribed medication regimen will improve Outcome: Not Progressing   Problem: Nutritional: Goal: Ability to achieve adequate nutritional intake will improve Outcome: Not Progressing   Problem: Safety: Goal: Ability to redirect hostility and anger into socially appropriate behaviors will improve Outcome: Not Progressing Goal: Ability to remain free from injury will improve Outcome: Not Progressing   Problem: Self-Care: Goal: Ability to participate in self-care as condition permits will improve Outcome: Not Progressing   Problem: Education: Goal: Knowledge of [facility] Education information/materials will improve Outcome: Not Progressing Goal: Emotional status will improve Outcome: Not Progressing Goal: Mental status will improve Outcome: Not Progressing Goal: Verbalization of understanding the information provided will improve Outcome: Not Progressing   Problem: Activity: Goal: Interest or engagement in activities will improve Outcome: Not Progressing Goal: Sleeping patterns will improve Outcome: Not Progressing   Problem: Coping: Goal: Ability to verbalize frustrations and anger appropriately will improve Outcome: Not Progressing Goal: Ability to demonstrate self-control will improve Outcome: Not Progressing   Problem: Health Behavior/Discharge Planning: Goal: Identification of resources available to assist in meeting health care needs will improve Outcome: Not Progressing Goal: Compliance with treatment plan for underlying cause of condition will improve Outcome: Not  Progressing   Problem: Physical Regulation: Goal: Ability to maintain clinical measurements within normal limits will improve Outcome: Not Progressing   Problem: Safety: Goal: Periods of time without injury will increase Outcome: Not Progressing   Problem: OP Suicidal Ideation Goal: LTG: Placement in appropriate level of care to safely address suicidal or parasuicidal crisis, based on clinical assessment Outcome: Not Progressing Goal: LTG: Valentin will be free from thoughts of self-harm as evidenced by suicide assessment or self-reported score of ideations Outcome: Not Progressing Goal: STG: Educate Briar on principles of suicide risk safety plan and levels of interventions Outcome: Not Progressing Goal: STG: Work with Sandra to create and implement personal suicide risk safety plan Outcome: Not Progressing

## 2024-03-11 NOTE — BH Assessment (Signed)
 Comprehensive Clinical Assessment (CCA) Note  03/11/2024 Charles Charles [id number]   Disposition: Per Alan Mcardle, NP patient does meet inpatient criteria.  Disposition SW to pursue appropriate inpatient options.  The patient demonstrates the following risk factors for suicide: Chronic risk factors for suicide include: psychiatric disorder of Schizophrenia, substance use disorder, previous suicide attempts  , medical illness HIV, and demographic factors (male, >34 y/o). Acute risk factors for suicide include: family or marital conflict and homelessness. Protective factors for this patient include: hope for the future. Considering these factors, the overall suicide risk at this point appears to be high. Patient is appropriate for outpatient follow up once stabilized.   Charles Charles is a 63 yo male reporting to [facility] for assessment via [facility]  voluntarily .Pt reports that he is hearing voices telling him to hurt himself and if anyone else gets in his way to hurt them too. Pt. stated that he is homeless and missing his mother and needs to be with her. Pt stated that he had an aaprtment but the voices and the drug use ( crack cocaine) caused him to walk away from it and now he has lost everything. History of psych admissions for similar symptoms. hx of HIV and schizophrenia as reported by pt. Pt stated he has not eaten in 3-4 days, only has slept around 2 hours and use crack 3-4 days ago. Endorsed SI but denied having a plan or intent. Pt stated  this is just hard and i cant take it. Pt stated he hears voices at baseline but they got worse about 1 day ago and he is unable to manage.Patient denies SI, HI, AVH. He reports history of past attempts, with most recent several months ago its all in my records, I was just up here in [place] not that long ago.  Patient has a hx of using crack cocaine:  Last use was.3-4 days ago.Patient is unable to contract for safety outside of the hospital. Treatment options were  discussed and patient is in agreement with recommendation for Inpatient Behavioral Health treatment   Chief Complaint:  Chief Complaint  Patient presents with   Schizophrenia    Command Auditory Hallucinations   Visit Diagnosis: Schizophrenia    CCA Screening, Triage and Referral (STR)  Patient Reported Information How did you hear about us ? Legal System  What Is the Reason for Your Visit/Call Today? Charles Charles is a 63 yo male reporting to [facility] for assessment via GPD  voluntarily .Pt reports that he is hearing voices telling him to hurt himself and if anyone else gets in his way to hurt them too. Pt. stated that he is homeless and missing his mother and needs to be with her. Pt stated that he had an aaprtment but the voices and the drug use ( crack cocaine) causeeed him to walk away from it and now he has lost everything. History of psych admissions for similar symptoms. hx of HIV and schizophrenia as reported by pt. Pt stated he has not eaten in 3-4 days, only has slept around 2 hours and use crack 3-4 days ago. Endorsed SI but denied having a plan or intent. Pt stated  this is just hard and i cant take it. Pt stated he hears voices at baseline but they got worse about 1 day ago and he is unable to manage.  How Long Has This Been Causing You Problems? 1 wk - 1 month  What Do You Feel Would Help You the Most Today? Alcohol  or Drug Use Treatment; Food Assistance; Housing Assistance; Social Support; Treatment for Depression or other mood problem   Have You Recently Had Any Thoughts About Hurting Yourself? -- (voices telling him to hurt self)  Are You Planning to Commit Suicide/Harm Yourself At This time? No   Flowsheet Row Admission (Discharged) from 11/11/2023 in [facility] ED from 11/10/2023 in [facility] Emergency Department at [facility] ED from 02/23/2023 in [facility] Emergency Department at [facility]  C-SSRS RISK CATEGORY Low Risk Low  Risk No Risk    Have you Recently Had Thoughts About Hurting Someone [place]? Yes (voices telling him to hurt himself)  Are You Planning to Harm Someone at This Time? No  Explanation: Denies HI   Have You Used Any Alcohol or Drugs in the Past 24 Hours? No  How Long Ago Did You Use Drugs or Alcohol? Yesterday  What Did You Use and How Much? Cocaine ( $20) and ETOH ( 3-4 shots)   Do You Currently Have a Therapist/Psychiatrist? No  Name of Therapist/Psychiatrist:    Have You Been Recently Discharged From Any Office Practice or Programs? No  Explanation of Discharge From Practice/Program: no    CCA Screening Triage Referral Assessment Type of Contact: Face-to-Face  Telemedicine Service Delivery:   Is this Initial or Reassessment?   Date Telepsych consult ordered in [facility]:    Time Telepsych consult ordered in [facility]:    Location of Assessment: [facility] ED  Provider Location: [facility] ED   Collateral Involvement: n/a   Does Patient Have a Court Appointed Legal Guardian? No  Legal Guardian Contact Information: n/a  Copy of Legal Guardianship Form: -- (n/a)  Legal Guardian Notified of Arrival: -- (n/a)  Legal Guardian Notified of Pending Discharge: -- (n/a)  If Minor and Not Living with Parent(s), Who has Custody? n/a  Is CPS involved or ever been involved? Never  Is APS involved or ever been involved? Never   Patient Determined To Be At Risk for Harm To Self or Others Based on Review of Patient Reported Information or Presenting Complaint? No  Method: No Plan  Availability of Means: No access or NA  Intent: Vague intent or NA  Notification Required: No need or identified person  Additional Information for Danger to Others Potential: Active psychosis  Additional Comments for Danger to Others Potential: Pt heard voices on 11/09/23 telling him to harm himself and others.  Are There Guns or Other Weapons in Your Home? No  Types of Guns/Weapons: N/A  Are These Weapons  Safely Secured?                            No  Who Could Verify You Are Able To Have These Secured: Pt has no access  Do You Have any Outstanding Charges, Pending Court Dates, Parole/Probation? Pt denies pending legal charges  Contacted To Inform of Risk of Harm To Self or Others: -- (n/a)    Does Patient Present under Involuntary Commitment? No    [place] of Residence: [place]   Patient Currently Receiving the Following Services: Not Receiving Services   Determination of Need: Urgent (48 hours)   Options For Referral: Inpatient Hospitalization; Outpatient Therapy; Chemical Dependency Intensive Outpatient Therapy (CDIOP)     CCA Biopsychosocial Patient Reported Schizophrenia/Schizoaffective Diagnosis in Past: No   Strengths: Able to answer quesitons honestly. Cooperative and friendly. Medication compliant.   Mental Health Symptoms Depression:  Difficulty Concentrating; Hopelessness; Fatigue; Sleep (  too much or little); Tearfulness; Worthlessness   Duration of Depressive symptoms: Duration of Depressive Symptoms: Greater than two weeks   Mania:  None   Anxiety:   Difficulty concentrating; Fatigue; Worrying; Sleep   Psychosis:  Hallucinations   Duration of Psychotic symptoms: Duration of Psychotic Symptoms: Greater than six months   Trauma:  None   Obsessions:  None   Compulsions:  None   Inattention:  N/A   Hyperactivity/Impulsivity:  N/A   Oppositional/Defiant Behaviors:  N/A   Emotional Irregularity:  N/A   Other Mood/Personality Symptoms:  none reported    Mental Status Exam Appearance and self-care  Stature:  Average   Weight:  Average weight   Clothing:  Disheveled   Grooming:  Neglected   Cosmetic use:  None   Posture/gait:  Normal   Motor activity:  Not Remarkable   Sensorium  Attention:  Distractible   Concentration:  Anxiety interferes; Variable   Orientation:  X5   Recall/memory:  Normal   Affect and Mood  Affect:   Depressed; Tearful   Mood:  Depressed   Relating  Eye contact:  Fleeting   Facial expression:  Depressed; Sad   Attitude toward examiner:  Cooperative   Thought and Language  Speech flow: Clear and Coherent   Thought content:  Appropriate to Mood and Circumstances   Preoccupation:  Guilt (feels guilty about relapsing on Crack and losing his apartment)   Hallucinations:  Auditory; Command (Comment)   Organization:  Therapist, nutritional of Knowledge:  Average   Intelligence:  Average   Abstraction:  Functional   Judgement:  Scientist, research (life sciences):  Adequate   Insight:  Fair   Decision Making:  Vacilates   Social Functioning  Social Maturity:  Responsible (was living on his own in an aaprtment until relapse)   Social Judgement:  Chief of Staff; Normal   Stress  Stressors:  Housing; Family conflict (cannot live with mother due to conflict with son. Daughter may not support while in active addiction.)   Coping Ability:  Deficient supports   Skill Deficits:  Self-control   Supports:  Support needed     Religion: Religion/Spirituality Are You A Religious Person?:  (n/a) How Might This Affect Treatment?: n/a  Leisure/Recreation: Leisure / Recreation Do You Have Hobbies?:  (n/a)  Exercise/Diet: Exercise/Diet Do You Exercise?: Yes What Type of Exercise Do You Do?: Run/Walk How Many Times a Week Do You Exercise?: Daily Have You Gained or Lost A Significant Amount of Weight in the Past Six Months?: Yes-Lost Number of Pounds Lost?:  (not sure) Do You Follow a Special Diet?: No Do You Have Any Trouble Sleeping?: Yes Explanation of Sleeping Difficulties: Voices keep him from sleeping as well as access to a place to sleep comfortably and safely due to homelessness   CCA Employment/Education Employment/Work Situation: Employment / Work Situation Employment Situation: On disability Why is Patient on Disability: Schizophrenia How  Long has Patient Been on Disability: several years Patient's Job has Been Impacted by Current Illness: No Has Patient ever Been in the [employer]?: No  Education: Education Is Patient Currently Attending School?: No Last Grade Completed:  (na) Did You Attend College?:  (n/a) Did You Have An Individualized Education Program (IIEP):  (na) Did You Have Any Difficulty At School?:  (na) Patient's Education Has Been Impacted by Current Illness:  (na)   CCA Family/Childhood History Family and Relationship History: Family history Marital status: Single Does patient have children?: Yes  How many children?: 2 How is patient's relationship with their children?: strained relationship with son. Speaks to daughter however does not have her support at this time.  Childhood History:  Childhood History By whom was/is the patient raised?: Both parents Did patient suffer any verbal/emotional/physical/sexual abuse as a child?: No Did patient suffer from severe childhood neglect?: No Has patient ever been sexually abused/assaulted/raped as an adolescent or adult?: No Was the patient ever a victim of a crime or a disaster?: No Witnessed domestic violence?: No Has patient been affected by domestic violence as an adult?: No       CCA Substance Use Alcohol/Drug Use: Alcohol / Drug Use Pain Medications: see MAR Prescriptions: See MAR Over the Counter: See MAR History of alcohol / drug use?: Yes Longest period of sobriety (when/how long): 3-4 months about 1 week ago Negative Consequences of Use: Personal relationships (housing) Withdrawal Symptoms: None (pt stated he did not know) Substance #1 Name of Substance 1: Crack Cocaine 1 - Age of First Use: unknown 1 - Amount (size/oz): not that much 1 - Frequency: whenever i can get it 1 - Duration: Many years 1 - Last Use / Amount: 3-4 days ago /unkown amount 1 - Method of Aquiring: from the street 1- Route of Use: smoking                        ASAM's:  Six Dimensions of Multidimensional Assessment  Dimension 1:  Acute Intoxication and/or Withdrawal Potential:   Dimension 1:  Description of individual's past and current experiences of substance use and withdrawal: Has a long history of substance abuse  Dimension 2:  Biomedical Conditions and Complications:   Dimension 2:  Description of patient's biomedical conditions and  complications: HIV positive  Dimension 3:  Emotional, Behavioral, or Cognitive Conditions and Complications:  Dimension 3:  Description of emotional, behavioral, or cognitive conditions and complications: Depression, anxiety, command auditory halluciantions  Dimension 4:  Readiness to Change:  Dimension 4:  Description of Readiness to Change criteria: stated he wanted to get help becasue using has caused him to lose eveything  Dimension 5:  Relapse, Continued use, or Continued Problem Potential:  Dimension 5:  Relapse, continued use, or continued problem potential critiera description: Use despite negative consequences in the past, around other users, homelessness and no supports situation may not change.  Dimension 6:  Recovery/Living Environment:  Dimension 6:  Recovery/Iiving environment criteria description: Living on the street and sleeping anywhere he can find shelter. living situation not conducive to recovery .  ASAM Severity Score: ASAM's Severity Rating Score: 13  ASAM Recommended Level of Treatment: ASAM Recommended Level of Treatment: Level II Intensive Outpatient Treatment   Substance use Disorder (SUD) Substance Use Disorder (SUD)  Checklist Symptoms of Substance Use: Continued use despite having a persistent/recurrent physical/psychological problem caused/exacerbated by use, Continued use despite persistent or recurrent social, interpersonal problems, caused or exacerbated by use, Persistent desire or unsuccessful efforts to cut down or control use, Presence of craving or strong urge to use,  Recurrent use that results in a failure to fulfill major role obligations (work, school, home)  Recommendations for Services/Supports/Treatments: Recommendations for Services/Supports/Treatments Recommendations For Services/Supports/Treatments: Individual Therapy, CD-IOP Intensive Chemical Dependency Program, Transitional Living, SAIOP (Substance Abuse Intensive Outpatient Program)  Disposition Recommendation per psychiatric provider: We recommend inpatient psychiatric hospitalization when medically cleared. Patient is under voluntary admission status at this time; please IVC if attempts to leave hospital.   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) 12/03/2022   HIV disease (HCC) 12/03/2022   History of syphilis 12/03/2022   COPD (chronic obstructive pulmonary disease) (HCC) 12/03/2022   Neutropenia (HCC) 12/03/2022   Noncompliance 05/06/2016   Chest pain 04/14/2016   Abdominal pain, epigastric 04/14/2016   GERD (gastroesophageal reflux disease) 04/10/2016   Tobacco use disorder 04/10/2016   Cocaine use disorder, severe, dependence (HCC) 12/17/2013   Adult hypothyroidism 12/17/2013   Blurred vision 12/17/2013   Congenital hiatus hernia 10/01/2010     Referrals to Alternative Service(s): Referred to Alternative Service(s):   Place:   Date:   Time:    Referred to Alternative Service(s):   Place:   Date:   Time:    Referred to Alternative Service(s):   Place:   Date:   Time:    Referred to Alternative Service(s):   Place:   Date:   Time:     Devaughn Molt

## 2024-03-11 NOTE — Progress Notes (Signed)
 Patient ID: Charles Charles, male   DOB: [birth year], 63 y.o.   MRN: [id number]  D: Pt here voluntarily from [facility] Pt denies VH at this time. Endorses command AH that tell him to kill himself and to harm others. Pt endorses passive SI and HI and contracts for safety on unit. Pt states he has been forgetful lately and has not been remembering to take his medications as prescribed. Pt has bottles of his medication in his belongings. Pt is currently homeless without any support. Endorses depression 7/10. Also endorsing insomnia. Pt states he hasn't been eating regularly because of a depressed mood causing a decrease in appetite. Pt states he takes marijuana when he wants to eat to increase his appetite. Pt endorses crack cocaine use. Last use was 3-4 days ago. Last alcohol use was 4-5 days ago and pt states he has 1 beer a couple times a week.  Endorsing pain 10/10 in his left elbow with unknown origin.  Pt states the voices have gotten worse recently. He is here for help because he wanted to walk into traffic when the voices told him to kill himself. Instead I got to a phone and called the ambulance. Pt endorses issues with sleep because he doesn't feel safe where he has to sleep. States he just moved from [place] to [place] and does not have services, such as a primary physician, set up yet. Pt states his eyeglasses are broken  and he has blurry vision without them. Pt is a high fall risk for this reason. No falls reported in last 6 months.   A: Pt was offered support and encouragement. Pt is cooperative during assessment. VS assessed and admission paperwork signed. Belongings searched and contraband items placed in locker. Non-invasive skin search completed: old scar to right abdomen, scratches bilateral arms; feet are dry and flaky. Pt offered food and drink and both accepted. Pt introduced to unit milieu by nursing staff. Q 15 minute checks were started for safety.   R: Pt in room L28. Pt safety  maintained on unit.

## 2024-03-11 NOTE — ED Notes (Signed)
 Report called to RN Loetta, [facility].  Pending transfer via [facility] at 9.30pm.

## 2024-03-11 NOTE — Discharge Instructions (Addendum)
 Pt transferred to [facility] for inpatient treatment

## 2024-03-11 NOTE — Progress Notes (Signed)
   03/11/24 2210  Psych Admission Type (Psych Patients Only)  Admission Status Voluntary  Psychosocial Assessment  Patient Complaints Anxiety;Depression  Eye Contact Fair  Facial Expression Anxious  Affect Anxious;Depressed  Speech Logical/coherent  Interaction Assertive  Motor Activity Slow  Appearance/Hygiene In scrubs  Behavior Characteristics Appropriate to situation;Cooperative;Anxious  Mood Depressed;Anxious;Pleasant  Thought Process  Coherency WDL  Content WDL  Delusions None reported or observed  Perception Hallucinations  Hallucination Auditory;Command  Judgment Impaired  Confusion None  Danger to Self  Current suicidal ideation? Passive  Description of Suicide Plan no plan right now but CAH tell him to kill himself and others  Agreement Not to Harm Self Yes  Description of Agreement verbal  Danger to Others  Danger to Others None reported or observed

## 2024-03-11 NOTE — Progress Notes (Signed)
 Received report on pt coming to [facility].

## 2024-03-12 ENCOUNTER — Encounter: Payer: Self-pay | Admitting: Psychiatry

## 2024-03-12 DIAGNOSIS — F259 Schizoaffective disorder, unspecified: Secondary | ICD-10-CM | POA: Diagnosis not present

## 2024-03-12 MED ORDER — SERTRALINE HCL 50 MG PO TABS
100.0000 mg | ORAL_TABLET | Freq: Every day | ORAL | Status: DC
  Administered 2024-03-13 – 2024-03-23 (×11): 100 mg via ORAL
  Filled 2024-03-12 (×11): qty 2

## 2024-03-12 MED ORDER — RISPERIDONE 1 MG PO TBDP
1.0000 mg | ORAL_TABLET | Freq: Every day | ORAL | Status: DC
  Administered 2024-03-12: 1 mg via ORAL
  Filled 2024-03-12: qty 1

## 2024-03-12 NOTE — Progress Notes (Signed)
 Pt HIV medication that was left at [facility] was delivered by [facility] courier to this Clinical research associate. Medication dropped off at pharmacy. Pharmacy staff said that pt's nurse will be required to come to pharmacy and sign for each dose to be dispensed. Will pass this information on to day shift.

## 2024-03-12 NOTE — Progress Notes (Signed)
   03/12/24 1300  Psych Admission Type (Psych Patients Only)  Admission Status Voluntary  Psychosocial Assessment  Patient Complaints Anxiety;Depression  Eye Contact Fair  Facial Expression Anxious  Affect Depressed  Speech Logical/coherent  Interaction Assertive  Motor Activity Slow  Appearance/Hygiene In scrubs  Behavior Characteristics Cooperative  Mood Depressed  Thought Process  Coherency WDL  Content WDL  Delusions None reported or observed  Perception Hallucinations  Hallucination Auditory;Command  Judgment Impaired  Confusion None  Danger to Self  Current suicidal ideation? Passive  Agreement Not to Harm Self Yes  Description of Agreement verbal  Danger to Others  Danger to Others None reported or observed

## 2024-03-12 NOTE — BH IP Treatment Plan (Signed)
 Interdisciplinary Treatment and Diagnostic Plan Update  03/12/2024 Time of Session: 10:38 AM  [facility] MRN: [id number]  Principal Diagnosis: Schizoaffective disorder [facility])  Secondary Diagnoses: Principal Problem:   Schizoaffective disorder (HCC) Active Problems:   Substance-induced psychotic disorder with hallucinations (HCC)   Current Medications:  Current Facility-Administered Medications  Medication Dose Route Frequency Provider Last Rate Last Admin   acetaminophen  (TYLENOL ) tablet 650 mg  650 mg Oral Q6H PRN Brent, Amanda C, NP       albuterol  (VENTOLIN  HFA) 108 (90 Base) MCG/ACT inhaler 2 puff  2 puff Inhalation Q6H PRN Brent, Amanda C, NP       alum & mag hydroxide-simeth (MAALOX/MYLANTA) 200-200-20 MG/5ML suspension 30 mL  30 mL Oral Q4H PRN Brent, Amanda C, NP       doravirin-lamivudin-tenofov df (DELSTRIGO ) 100-300-300 MG per tablet 1 tablet  1 tablet Oral Daily Jadapalle, Sree, MD   1 tablet at 03/12/24 0857   hydrOXYzine  (ATARAX ) tablet 25 mg  25 mg Oral TID PRN Brent, Amanda C, NP       magnesium  hydroxide (MILK OF MAGNESIA) suspension 30 mL  30 mL Oral Daily PRN Brent, Amanda C, NP       nicotine  (NICODERM CQ  - dosed in mg/24 hours) patch 14 mg  14 mg Transdermal Q0600 Brent, Amanda C, NP       OLANZapine  (ZYPREXA ) injection 5 mg  5 mg Intramuscular TID PRN Brent, Amanda C, NP       OLANZapine  zydis (ZYPREXA ) disintegrating tablet 5 mg  5 mg Oral TID PRN Brent, Amanda C, NP       pantoprazole  (PROTONIX ) EC tablet 20 mg  20 mg Oral Daily Brent, Amanda C, NP   20 mg at 03/12/24 0857   risperiDONE  (RISPERDAL  M-TABS) disintegrating tablet 1 mg  1 mg Oral QHS Jadapalle, Sree, MD       [START ON 03/13/2024] sertraline  (ZOLOFT ) tablet 100 mg  100 mg Oral Daily Jadapalle, Sree, MD       PTA Medications: Medications Prior to Admission  Medication Sig Dispense Refill Last Dose/Taking   albuterol  (VENTOLIN  HFA) 108 (90 Base) MCG/ACT inhaler Inhale 2 puffs into the lungs every 6  (six) hours as needed for wheezing or shortness of breath.      doravirin-lamivudin-tenofov df (DELSTRIGO ) 100-300-300 MG TABS per tablet Take 1 tablet by mouth daily. 30 tablet 3    hydrOXYzine  (ATARAX ) 25 MG tablet Take 1 tablet (25 mg total) by mouth 3 (three) times daily as needed for anxiety. (Patient taking differently: Take 25 mg by mouth 3 (three) times daily.) 30 tablet 0    OLANZapine  (ZYPREXA ) 15 MG tablet Take 1 tablet (15 mg total) by mouth at bedtime. 30 tablet 0    pantoprazole  (PROTONIX ) 20 MG tablet Take 1 tablet (20 mg total) by mouth daily. 30 tablet 0    sertraline  (ZOLOFT ) 50 MG tablet Take 1 tablet (50 mg total) by mouth daily. 30 tablet 0     Patient Stressors: Financial difficulties   Loss of close relationship with his mother and children.   Medication change or noncompliance   Substance abuse    Patient Strengths: Ability for insight  Communication skills   Treatment Modalities: Medication Management, Group therapy, Case management,  1 to 1 session with clinician, Psychoeducation, Recreational therapy.   Physician Treatment Plan for Primary Diagnosis: Schizoaffective disorder [facility]) Long Term Goal(s): Improvement in symptoms so as ready for discharge   Short Term Goals: Ability to identify  changes in lifestyle to reduce recurrence of condition will improve Ability to verbalize feelings will improve Ability to disclose and discuss suicidal ideas Ability to demonstrate self-control will improve Ability to identify and develop effective coping behaviors will improve Ability to maintain clinical measurements within normal limits will improve Compliance with prescribed medications will improve Ability to identify triggers associated with substance abuse/mental health issues will improve  Medication Management: Evaluate patient's response, side effects, and tolerance of medication regimen.  Therapeutic Interventions: 1 to 1 sessions, Unit Group sessions and  Medication administration.  Evaluation of Outcomes: Not Progressing  Physician Treatment Plan for Secondary Diagnosis: Principal Problem:   Schizoaffective disorder (HCC) Active Problems:   Substance-induced psychotic disorder with hallucinations (HCC)  Long Term Goal(s): Improvement in symptoms so as ready for discharge   Short Term Goals: Ability to identify changes in lifestyle to reduce recurrence of condition will improve Ability to verbalize feelings will improve Ability to disclose and discuss suicidal ideas Ability to demonstrate self-control will improve Ability to identify and develop effective coping behaviors will improve Ability to maintain clinical measurements within normal limits will improve Compliance with prescribed medications will improve Ability to identify triggers associated with substance abuse/mental health issues will improve     Medication Management: Evaluate patient's response, side effects, and tolerance of medication regimen.  Therapeutic Interventions: 1 to 1 sessions, Unit Group sessions and Medication administration.  Evaluation of Outcomes: Not Progressing   RN Treatment Plan for Primary Diagnosis: Schizoaffective disorder (HCC) Long Term Goal(s): Knowledge of disease and therapeutic regimen to maintain health will improve  Short Term Goals: Ability to remain free from injury will improve, Ability to verbalize frustration and anger appropriately will improve, Ability to demonstrate self-control, Ability to participate in decision making will improve, Ability to verbalize feelings will improve, Ability to disclose and discuss suicidal ideas, Ability to identify and develop effective coping behaviors will improve, and Compliance with prescribed medications will improve  Medication Management: RN will administer medications as ordered by provider, will assess and evaluate patient's response and provide education to patient for prescribed medication. RN  will report any adverse and/or side effects to prescribing provider.  Therapeutic Interventions: 1 on 1 counseling sessions, Psychoeducation, Medication administration, Evaluate responses to treatment, Monitor vital signs and CBGs as ordered, Perform/monitor CIWA, COWS, AIMS and Fall Risk screenings as ordered, Perform wound care treatments as ordered.  Evaluation of Outcomes: Not Progressing   LCSW Treatment Plan for Primary Diagnosis: Schizoaffective disorder (HCC) Long Term Goal(s): Safe transition to appropriate next level of care at discharge, Engage patient in therapeutic group addressing interpersonal concerns.  Short Term Goals: Engage patient in aftercare planning with referrals and resources, Increase social support, Increase ability to appropriately verbalize feelings, Increase emotional regulation, Facilitate acceptance of mental health diagnosis and concerns, Facilitate patient progression through stages of change regarding substance use diagnoses and concerns, Identify triggers associated with mental health/substance abuse issues, and Increase skills for wellness and recovery  Therapeutic Interventions: Assess for all discharge needs, 1 to 1 time with Social worker, Explore available resources and support systems, Assess for adequacy in community support network, Educate family and significant other(s) on suicide prevention, Complete Psychosocial Assessment, Interpersonal group therapy.  Evaluation of Outcomes: Not Progressing   Progress in Treatment: Attending groups: Yes. and No. Participating in groups: Yes. and No. Taking medication as prescribed: Yes. Toleration medication: Yes. Family/Significant other contact made: No, will contact:  CSW will contact if given permission  Patient understands diagnosis: Yes. Discussing  patient identified problems/goals with staff: Yes. Medical problems stabilized or resolved: Yes. Denies suicidal/homicidal ideation: Yes. Issues/concerns  per patient self-inventory: No. Other: None   New problem(s) identified: No, Describe:  None identified   New Short Term/Long Term Goal(s): elimination of symptoms of psychosis, medication management for mood stabilization; elimination of SI thoughts; development of comprehensive mental wellness plan.   Patient Goals:  Just trying to stop hearing voices   Discharge Plan or Barriers: CSW will assist with appropriate discharge planning   Reason for Continuation of Hospitalization: Hallucinations Medication stabilization  Estimated Length of Stay: 1 to 7 days   Last 3 [place] Suicide Severity Risk Score: Flowsheet Row Admission (Current) from 03/11/2024 in [facility] BEHAVIORAL MEDICINE Most recent reading at 03/11/2024 10:10 PM ED from 03/11/2024 in [facility] Most recent reading at 03/11/2024  3:32 PM Admission (Discharged) from 11/11/2023 in [facility] INPATIENT BEHAVIORAL MEDICINE Most recent reading at 11/11/2023  2:00 AM  C-SSRS RISK CATEGORY Moderate Risk Low Risk Low Risk    Last PHQ 2/9 Scores:     No data to display          Scribe for Treatment Team: Lum JONETTA Croft, ISRAEL 03/12/2024 1:14 PM

## 2024-03-12 NOTE — Group Note (Signed)
 Date:  03/12/2024 Time:  10:46 AM  Group Topic/Focus:  Self Care:   The focus of this group is to help patients understand the importance of self-care in order to improve or restore emotional, physical, spiritual, interpersonal, and financial health.    Participation Level:  Did Not Attend  Participation Quality:  Did not attend  Affect:  Did not attend  Cognitive:  Did not attend  Insight: None  Engagement in Group:  Did not attend  Modes of Intervention:  Did not attend  Additional Comments:    Leigh VEAR Pais 03/12/2024, 10:46 AM

## 2024-03-12 NOTE — Progress Notes (Signed)
(  Sleep Hours) - 6.5 (Any PRNs that were needed, meds refused, or side effects to meds)- none (Any disturbances and when (visitation, over night)- nonr (Concerns raised by the patient)- his prescription meds for HIV were left at [facility]. Contacted nurse there and medications sent to [facility] by [facility] courier. Meds now in pharmacy (SI/HI/AVH)-  endorses passive SI, HI and CAH. Contracts for safety on unit

## 2024-03-12 NOTE — BHH Suicide Risk Assessment (Signed)
 [facility] Admission Suicide Risk Assessment   Nursing information obtained from:  Patient Demographic factors:  Male, Low socioeconomic status, Living alone, Unemployed Current Mental Status:  Suicidal ideation indicated by patient Loss Factors:  NA Historical Factors:  NA Risk Reduction Factors:  NA  Total Time spent with patient: 30 minutes Principal Problem: Schizoaffective disorder (HCC) Diagnosis:  Principal Problem:   Schizoaffective disorder (HCC)  Subjective Data: Charles Charles is a 63 yo male reporting to [facility] for assessment via [employer] voluntarily .Pt reports that he is hearing voices telling him to hurt himself and if anyone else gets in his way to hurt them too. Pt. stated that he is homeless and missing his mother and needs to be with her. Pt stated that he had an aaprtment but the voices and the drug use ( crack cocaine) caused him to walk away from it and now he has lost everything. History of psych admissions for similar symptoms. hx of HIV and schizophrenia as reported by pt. Pt stated he has not eaten in 3-4 days, only has slept around 2 hours and use crack 3-4 days ago. Endorsed SI but denied having a plan or intent Patient is admitted to [facility] psych unit with Q15 min safety monitoring. Multidisciplinary team approach is offered. Medication management; group/milieu therapy is offered.   Continued Clinical Symptoms:  Alcohol Use Disorder Identification Test Final Score (AUDIT): 9 The Alcohol Use Disorders Identification Test, Guidelines for Use in Primary Care, Second Edition.  World Science writer [facility]). Score between 0-7:  no or low risk or alcohol related problems. Score between 8-15:  moderate risk of alcohol related problems. Score between 16-19:  high risk of alcohol related problems. Score 20 or above:  warrants further diagnostic evaluation for alcohol dependence and treatment.   CLINICAL FACTORS:   Severe Anxiety and/or Agitation   Musculoskeletal: Strength &  Muscle Tone: within normal limits Gait & Station: normal Patient leans: N/A  Psychiatric Specialty Exam:  Presentation  General Appearance:  Appropriate for Environment; Casual  Eye Contact: Fleeting  Speech: Clear and Coherent  Speech Volume: Decreased  Handedness: Right   Mood and Affect  Mood: Depressed; Dysphoric  Affect: Depressed; Flat   Thought Process  Thought Processes: Coherent  Descriptions of Associations:Intact  Orientation:Full (Time, Place and Person)  Thought Content:WDL  History of Schizophrenia/Schizoaffective disorder:No  Duration of Psychotic Symptoms:Greater than six months  Hallucinations:Hallucinations: Auditory; Command Description of Command Hallucinations: Telling him to harm himself and sometimes others  Ideas of Reference:None  Suicidal Thoughts:Suicidal Thoughts: Yes, Active SI Active Intent and/or Plan: Without Intent; With Plan  Homicidal Thoughts:Homicidal Thoughts: No   Sensorium  Memory: Recent Fair; Immediate Good  Judgment: Fair  Insight: Fair   Chartered certified accountant: Fair  Attention Span: Fair  Recall: [employer] of Knowledge: Fair  Language: Fair   Psychomotor Activity  Psychomotor Activity: Psychomotor Activity: Normal   Assets  Assets: Manufacturing systems engineer; Desire for Improvement; Financial Resources/Insurance; Resilience; Leisure Time   Sleep  Sleep: Sleep: Poor Number of Hours of Sleep: 2    Physical Exam: Physical Exam ROS Blood pressure (!) 108/55, pulse (!) 51, temperature 97.6 F (36.4 C), resp. rate 16, height 5' 6 (1.676 m), weight 73 kg, SpO2 100%. Body mass index is 25.98 kg/m.   COGNITIVE FEATURES THAT CONTRIBUTE TO RISK:  None    SUICIDE RISK:   Minimal: No identifiable suicidal ideation.  Patients presenting with no risk factors but with morbid ruminations; may be classified as  minimal risk based on the severity of the depressive  symptoms  PLAN OF CARE: Patient is admitted to [facility] psych unit with Q15 min safety monitoring. Multidisciplinary team approach is offered. Medication management; group/milieu therapy is offered.   I certify that inpatient services furnished can reasonably be expected to improve the patient's condition.   Allyn Foil, MD 03/12/2024, 11:47 AM

## 2024-03-12 NOTE — Group Note (Signed)
 Physical/Occupational Therapy Group Note  Group Topic: Neurographic Art  Group Date: 03/12/2024 Start Time: 1305 End Time: 1350 Facilitators: Clive Warren CROME, OT   Group Description: Group participated with Neurographic art activity, using watercolor paints to facilitate creative expression and meditation/relaxation for each individual.  Incorporated bimanual coordination, mental focus, emotional processing, task/command following and relaxation techniques as appropriate.  Patients engaged socially with therapist and other group participants throughout session. Allowed to ask questions as appropriate, and encouraged to identify ways they could use/share their creations with themselves and others.  Therapeutic Goal(s):  Demonstrate ability to independently manipulate utensils required to participate with and complete activity. Demonstrate ability to cognitively focus on task and follow commands necessary for completion. Demonstrate use of art as an outlet for emotional processing and expression. Identify and demonstrate importance of relaxation, neural calming and meditation for improved participation with life groups.  Individual Participation: Pt quiet but did engage with prompting during discussion. Able to ID additional strategies for relaxation that work for him. Requested reading glasses, encouraged him to speak with nursing. Required MIN-MOD VC for instructions to initiate project.   Participation Level: Active   Participation Quality: Minimal Cues and Moderate Cues   Behavior: Alert, Appropriate, Calm, and Cooperative   Speech/Thought Process: Coherent and Organized   Affect/Mood: Appropriate   Insight: Moderate   Judgement: Moderate   Modes of Intervention: Activity, Clarification, Discussion, Education, Exploration, Problem-solving, Rapport Building, Socialization, and Support  Patient Response to Interventions:  Attentive, Engaged, and Receptive   Plan: Continue to engage  patient in PT/OT groups 1 - 2x/week.   Aracelli Woloszyn R., MPH, MS, OTR/L ascom [phone] 03/12/24, 3:07 PM
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
## 2024-03-12 NOTE — Group Note (Signed)
 Date:  03/12/2024 Time:  8:28 PM  Group Topic/Focus:  Wrap-Up Group:   The focus of this group is to help patients review their daily goal of treatment and discuss progress on daily workbooks.    Participation Level:  Active  Participation Quality:  Appropriate  Affect:  Appropriate  Cognitive:  Appropriate  Insight: Appropriate  Engagement in Group:  Engaged  Modes of Intervention:  Discussion  Additional Comments:    Charles Charles 03/12/2024, 8:28 PM

## 2024-03-12 NOTE — Group Note (Signed)
 Recreation Therapy Group Note   Group Topic:Communication  Group Date: 03/12/2024 Start Time: 1510 End Time: 1600 Facilitators: Celestia Jeoffrey BRAVO, LRT, CTRS Location: Courtyard  Group Description: Emotional Check in. Patient sat and talked with LRT about how they are doing and whatever else is on their mind. LRT provided active listening, reassurance and encouragement. Pts were given the opportunity to listen to music or color mandalas while they talk.    Goal Area(s) Addressed: Patient will engage in conversation with LRT. Patient will communicate their wants, needs, or questions.  Patient will practice a new coping skill of "talking to someone".   Affect/Mood: Blunted and Flat   Participation Level: Non-verbal    Clinical Observations/Individualized Feedback: Charles Charles chose to sit alone away from LRT and peers despite encouragement. Pt did not interact with LRT or peers while present.   Plan: Continue to engage patient in RT group sessions 2-3x/week.   Jeoffrey BRAVO Celestia, LRT, CTRS 03/12/2024 4:29 PM

## 2024-03-12 NOTE — Plan of Care (Signed)
   Problem: Coping: Goal: Ability to verbalize frustrations and anger appropriately will improve Outcome: Progressing Goal: Ability to demonstrate self-control will improve Outcome: Progressing

## 2024-03-13 DIAGNOSIS — F19951 Other psychoactive substance use, unspecified with psychoactive substance-induced psychotic disorder with hallucinations: Secondary | ICD-10-CM

## 2024-03-13 DIAGNOSIS — F259 Schizoaffective disorder, unspecified: Secondary | ICD-10-CM | POA: Diagnosis not present

## 2024-03-13 MED ORDER — RISPERIDONE 1 MG PO TBDP
1.0000 mg | ORAL_TABLET | Freq: Two times a day (BID) | ORAL | Status: DC
  Administered 2024-03-13 – 2024-03-15 (×4): 1 mg via ORAL
  Filled 2024-03-13 (×5): qty 1

## 2024-03-13 NOTE — Progress Notes (Signed)
   03/13/24 2100  Psych Admission Type (Psych Patients Only)  Admission Status Voluntary  Psychosocial Assessment  Patient Complaints Anxiety;Depression  Eye Contact Fair  Facial Expression Anxious  Affect Depressed  Speech Logical/coherent  Interaction Assertive  Motor Activity Slow  Appearance/Hygiene In scrubs  Behavior Characteristics Cooperative  Mood Depressed;Pleasant  Thought Process  Coherency WDL  Content WDL  Delusions None reported or observed  Perception Hallucinations  Hallucination Auditory  Judgment Impaired  Confusion None  Danger to Self  Current suicidal ideation? Denies  Self-Injurious Behavior No self-injurious ideation or behavior indicators observed or expressed   Agreement Not to Harm Self Yes  Description of Agreement verbal  Danger to Others  Danger to Others None reported or observed

## 2024-03-13 NOTE — Plan of Care (Signed)
   Problem: Coping: Goal: Coping ability will improve Outcome: Progressing Goal: Will verbalize feelings Outcome: Progressing

## 2024-03-13 NOTE — Plan of Care (Signed)
  Problem: Activity: Goal: Sleeping patterns will improve Outcome: Progressing   Problem: Coping: Goal: Ability to demonstrate self-control will improve Outcome: Progressing   Problem: Health Behavior/Discharge Planning: Goal: Compliance with treatment plan for underlying cause of condition will improve Outcome: Progressing   Problem: Safety: Goal: Periods of time without injury will increase Outcome: Progressing

## 2024-03-13 NOTE — Progress Notes (Signed)
 [facility] MD Progress Note  03/13/2024 1:30 PM Charles Charles  MRN:  [id number]  Charles Charles is a 63 year old male with a past medical history of HIV, CVA, hypothyroidism, COPD, GERD, MDD and alcohol and cocaine use who has been admitted from [facility] after attempting to walk onto oncoming traffic. He states that he hears voices telling him to hurt himself, kill himself, and sometimes to hurt anyone in his way.  He reports hearing male voices, command type.  He reports the voices been happening for a long time.   Subjective:  Chart reviewed, case discussed in multidisciplinary meeting, patient seen during rounds.  Today on interview patient is noted to be resting in bed.  He offers no complaints.  He continues to report auditory hallucinations telling him to hurt himself and other people.  He denies active SI or HI.  He reports depression as 8 out of 10, 10 being the worst and anxiety 7 out of 10.  Patient has fair appetite and sleep.  Per nursing report patient is participating in groups   Sleep: Fair  Appetite:  Fair  Past Psychiatric History: see h&P Family History:  Family History  Problem Relation Age of Onset   Anxiety disorder Mother    Diabetes Mother    Diabetes Father    Social History:  Social History   Substance and Sexual Activity  Alcohol Use Yes   Alcohol/week: 56.0 standard drinks of alcohol   Types: 56 Cans of beer per week   Comment: 1 beer 2 xs a week - last drink 4-5 days ago     Social History   Substance and Sexual Activity  Drug Use Yes   Types: Cocaine, Marijuana   Comment: last use 3-4 days ago    Social History   Socioeconomic History   Marital status: Single    Spouse name: Not on file   Number of children: Not on file   Years of education: Not on file   Highest education level: 8th grade  Occupational History   Not on file  Tobacco Use   Smoking status: Every Day    Current packs/day: 1.00    Average packs/day: 1 pack/day for 0.7 years (0.7 ttl  pk-yrs)    Types: Cigarettes    Start date: 2025   Smokeless tobacco: Never   Tobacco comments:    (1-2 cigs/day)  Vaping Use   Vaping status: Never Used  Substance and Sexual Activity   Alcohol use: Yes    Alcohol/week: 56.0 standard drinks of alcohol    Types: 56 Cans of beer per week    Comment: 1 beer 2 xs a week - last drink 4-5 days ago   Drug use: Yes    Types: Cocaine, Marijuana    Comment: last use 3-4 days ago   Sexual activity: Not Currently    Partners: Female  Other Topics Concern   Not on file  Social History Narrative   Not on file   Social Drivers of Health   Financial Resource Strain: High Risk (11/06/2022)   Received from [facility]   Overall Financial Resource Strain (CARDIA)    Difficulty of Paying Living Expenses: Hard  Food Insecurity: No Food Insecurity (03/11/2024)   Hunger Vital Sign    Worried About Running Out of Food in the Last Year: Never true    Ran Out of Food in the Last Year: Never true  Transportation Needs: Unmet Transportation Needs (03/11/2024)   PRAPARE -  Administrator, Civil Service (Medical): Yes    Lack of Transportation (Non-Medical): Yes  Physical Activity: Not on file  Stress: Not on file  Social Connections: Not on file   Past Medical History:  Past Medical History:  Diagnosis Date   Alcohol abuse 01/18/2021   Alcohol use disorder, moderate, dependence (HCC) 04/10/2016   Cannabis use disorder, moderate, dependence (HCC) 05/06/2016   COPD (chronic obstructive pulmonary disease) (HCC) 12/03/2022   COPD exacerbation (HCC) 12/03/2022   Drug overdose 01/05/2022   GERD (gastroesophageal reflux disease)    Headache    History of syphilis 12/03/2022   Neutropenia (HCC) 12/03/2022   Schizo-affective schizophrenia (HCC)    Suicidal ideation 04/09/2016    Past Surgical History:  Procedure Laterality Date   COLONOSCOPY WITH PROPOFOL  N/A 08/04/2015   Procedure: COLONOSCOPY WITH PROPOFOL ;  Surgeon: Rogelia Copping, MD;   Location: [facility];  Service: Endoscopy;  Laterality: N/A;   INCISION AND DRAINAGE PERIRECTAL ABSCESS N/A 07/14/2015   Procedure: IRRIGATION AND DEBRIDEMENT PERIRECTAL ABSCESS;  Surgeon: Charlie FORBES Fell, MD;  Location: [facility];  Service: General;  Laterality: N/A;   INCISION AND DRAINAGE PERIRECTAL ABSCESS N/A 08/14/2015   Procedure: IRRIGATION AND DEBRIDEMENT PERIRECTAL ABSCESS;  Surgeon: Dorothyann LITTIE Husk, MD;  Location: [facility];  Service: General;  Laterality: N/A;    Current Medications: Current Facility-Administered Medications  Medication Dose Route Frequency Provider Last Rate Last Admin   acetaminophen  (TYLENOL ) tablet 650 mg  650 mg Oral Q6H PRN Brent, Amanda C, NP   650 mg at 03/12/24 1400   albuterol  (VENTOLIN  HFA) 108 (90 Base) MCG/ACT inhaler 2 puff  2 puff Inhalation Q6H PRN Brent, Amanda C, NP       alum & mag hydroxide-simeth (MAALOX/MYLANTA) 200-200-20 MG/5ML suspension 30 mL  30 mL Oral Q4H PRN Brent, Amanda C, NP       doravirin-lamivudin-tenofov df (DELSTRIGO ) 100-300-300 MG per tablet 1 tablet  1 tablet Oral Daily Arron Mcnaught, MD   1 tablet at 03/13/24 0933   hydrOXYzine  (ATARAX ) tablet 25 mg  25 mg Oral TID PRN Brent, Amanda C, NP       magnesium  hydroxide (MILK OF MAGNESIA) suspension 30 mL  30 mL Oral Daily PRN Brent, Amanda C, NP       OLANZapine  (ZYPREXA ) injection 5 mg  5 mg Intramuscular TID PRN Brent, Amanda C, NP       OLANZapine  zydis (ZYPREXA ) disintegrating tablet 5 mg  5 mg Oral TID PRN Brent, Amanda C, NP       pantoprazole  (PROTONIX ) EC tablet 20 mg  20 mg Oral Daily Brent, Amanda C, NP   20 mg at 03/13/24 9066   risperiDONE  (RISPERDAL  M-TABS) disintegrating tablet 1 mg  1 mg Oral QHS Conchita Truxillo, MD   1 mg at 03/12/24 2135   sertraline  (ZOLOFT ) tablet 100 mg  100 mg Oral Daily Dempsy Damiano, MD   100 mg at 03/13/24 9066    Lab Results:  Results for orders placed or performed during the hospital encounter of 03/11/24 (from the past  48 hours)  POCT Urine Drug Screen - (I-Screen)     Status: Abnormal   Collection Time: 03/11/24  1:49 PM  Result Value Ref Range   POC Amphetamine UR None Detected NONE DETECTED (Cut Off Level 1000 ng/mL)   POC Secobarbital (BAR) None Detected NONE DETECTED (Cut Off Level 300 ng/mL)   POC Buprenorphine (BUP) None Detected NONE DETECTED (Cut Off Level 10 ng/mL)  POC Oxazepam (BZO) None Detected NONE DETECTED (Cut Off Level 300 ng/mL)   POC Cocaine UR Positive (A) NONE DETECTED (Cut Off Level 300 ng/mL)   POC Methamphetamine UR None Detected NONE DETECTED (Cut Off Level 1000 ng/mL)   POC Morphine  None Detected NONE DETECTED (Cut Off Level 300 ng/mL)   POC Methadone UR None Detected NONE DETECTED (Cut Off Level 300 ng/mL)   POC Oxycodone  UR None Detected NONE DETECTED (Cut Off Level 100 ng/mL)   POC Marijuana UR Positive (A) NONE DETECTED (Cut Off Level 50 ng/mL)  Urinalysis, Routine w reflex microscopic -Urine, Clean Catch     Status: Abnormal   Collection Time: 03/11/24  1:49 PM  Result Value Ref Range   Color, Urine YELLOW YELLOW   APPearance CLEAR CLEAR   Specific Gravity, Urine 1.008 1.005 - 1.030   pH 6.0 5.0 - 8.0   Glucose, UA NEGATIVE NEGATIVE mg/dL   Hgb urine dipstick MODERATE (A) NEGATIVE   Bilirubin Urine NEGATIVE NEGATIVE   Ketones, ur NEGATIVE NEGATIVE mg/dL   Protein, ur NEGATIVE NEGATIVE mg/dL   Nitrite NEGATIVE NEGATIVE   Leukocytes,Ua SMALL (A) NEGATIVE   RBC / HPF 0-5 0 - 5 RBC/hpf   WBC, UA 0-5 0 - 5 WBC/hpf   Bacteria, UA NONE SEEN NONE SEEN   Squamous Epithelial / HPF 0-5 0 - 5 /HPF   Mucus PRESENT     Comment: Performed at [facility], [address] [place], [place] [address]  SARS Coronavirus 2 by RT PCR (hospital order, performed in [facility] hospital lab) *cepheid single result test* Anterior Nasal Swab     Status: None   Collection Time: 03/11/24  2:00 PM   Specimen: Anterior Nasal Swab  Result Value Ref Range   SARS Coronavirus 2 by RT PCR  NEGATIVE NEGATIVE    Comment: Performed at [facility], [address] [place], [place] [address]  CBC with Differential/Platelet     Status: Abnormal   Collection Time: 03/11/24  5:16 PM  Result Value Ref Range   WBC 4.2 4.0 - 10.5 K/uL   RBC 4.39 4.22 - 5.81 MIL/uL   Hemoglobin 14.4 13.0 - 17.0 g/dL   HCT 58.5 60.9 - 47.9 %   MCV 94.3 80.0 - 100.0 fL   MCH 32.8 26.0 - 34.0 pg   MCHC 34.8 30.0 - 36.0 g/dL   RDW 86.7 88.4 - 84.4 %   Platelets 334 150 - 400 K/uL   nRBC 0.0 0.0 - 0.2 %   Neutrophils Relative % 30 %   Neutro Abs 1.2 (L) 1.7 - 7.7 K/uL   Lymphocytes Relative 52 %   Lymphs Abs 2.2 0.7 - 4.0 K/uL   Monocytes Relative 10 %   Monocytes Absolute 0.4 0.1 - 1.0 K/uL   Eosinophils Relative 7 %   Eosinophils Absolute 0.3 0.0 - 0.5 K/uL   Basophils Relative 1 %   Basophils Absolute 0.1 0.0 - 0.1 K/uL   Immature Granulocytes 0 %   Abs Immature Granulocytes 0.01 0.00 - 0.07 K/uL    Comment: Performed at [facility], [address], [place], [place] [address]  Comprehensive metabolic panel     Status: Abnormal   Collection Time: 03/11/24  5:16 PM  Result Value Ref Range   Sodium 143 135 - 145 mmol/L   Potassium 3.8 3.5 - 5.1 mmol/L   Chloride 108 98 - 111 mmol/L   CO2 23 22 - 32 mmol/L  Glucose, Bld 94 70 - 99 mg/dL    Comment: Glucose reference range applies only to samples taken after fasting for at least 8 hours.   BUN 9 8 - 23 mg/dL   Creatinine, Ser 8.98 0.61 - 1.24 mg/dL   Calcium  9.3 8.9 - 10.3 mg/dL   Total Protein 6.5 6.5 - 8.1 g/dL   Albumin 3.0 (L) 3.5 - 5.0 g/dL   AST 19 15 - 41 U/L   ALT 15 0 - 44 U/L   Alkaline Phosphatase 48 38 - 126 U/L   Total Bilirubin 0.4 0.0 - 1.2 mg/dL   GFR, Estimated >39 >39 mL/min    Comment: (NOTE) Calculated using the CKD-EPI Creatinine Equation (2021)    Anion gap 12 5 - 15    Comment: Performed at [facility], [address], [place], [place] [address]  Hemoglobin A1c     Status: Abnormal    Collection Time: 03/11/24  5:16 PM  Result Value Ref Range   Hgb A1c MFr Bld 5.7 (H) 4.8 - 5.6 %    Comment: (NOTE) Diagnosis of Diabetes The following HbA1c ranges recommended by the American Diabetes Association (ADA) may be used as an aid in the diagnosis of diabetes mellitus.  Hemoglobin             Suggested A1C NGSP%              Diagnosis  <5.7                   Non Diabetic  5.7-6.4                Pre-Diabetic  >6.4                   Diabetic  <7.0                   Glycemic control for                       adults with diabetes.     Mean Plasma Glucose 116.89 mg/dL    Comment: Performed at [facility], [address] [place], [place] [address]  Magnesium      Status: None   Collection Time: 03/11/24  5:16 PM  Result Value Ref Range   Magnesium  2.2 1.7 - 2.4 mg/dL    Comment: Performed at [facility], [address] [place], [place] [address]  Ethanol     Status: None   Collection Time: 03/11/24  5:16 PM  Result Value Ref Range   Alcohol, Ethyl (B) <15 <15 mg/dL    Comment: (NOTE) For medical purposes only. Performed at [facility], [address], [place], [place] [address]   Lipid panel     Status: None   Collection Time: 03/11/24  5:16 PM  Result Value Ref Range   Cholesterol 166 0 - 200 mg/dL   Triglycerides 897 <849 mg/dL   HDL 48 >59 mg/dL   Total CHOL/HDL Ratio 3.5 RATIO   VLDL 20 0 - 40 mg/dL   LDL Cholesterol 98 0 - 99 mg/dL    Comment:        Total Cholesterol/HDL:CHD Risk Coronary Heart Disease Risk Table                     Men   Women  1/2 Average Risk   3.4   3.3  Average Risk  5.0   4.4  2 X Average Risk   9.6   7.1  3 X Average Risk  23.4   11.0        Use the calculated Patient Ratio above and the CHD Risk Table to determine the patient's CHD Risk.        ATP III CLASSIFICATION (LDL):  <100     mg/dL   Optimal  899-870  mg/dL   Near or Above                    Optimal  130-159  mg/dL   Borderline   839-810  mg/dL   High  >809     mg/dL   Very High Performed at [facility], [address] [place], [place] [address]   TSH     Status: None   Collection Time: 03/11/24  5:16 PM  Result Value Ref Range   TSH 1.900 0.350 - 4.500 uIU/mL    Comment: Performed by a 3rd Generation assay with a functional sensitivity of <=0.01 uIU/mL. Performed at [facility], [address] [place], [place] [address]   T4, free     Status: None   Collection Time: 03/11/24  5:16 PM  Result Value Ref Range   Free T4 0.70 0.61 - 1.12 ng/dL    Comment: (NOTE) Biotin ingestion may interfere with free T4 tests. If the results are inconsistent with the TSH level, previous test results, or the clinical presentation, then consider biotin interference. If needed, order repeat testing after stopping biotin. Performed at [facility], [address] [place], [place] [address]     Blood Alcohol level:  Lab Results  Component Value Date   ETH <15 03/11/2024   ETH 185 (H) 11/10/2023    Metabolic Disorder Labs: Lab Results  Component Value Date   HGBA1C 5.7 (H) 03/11/2024   MPG 116.89 03/11/2024   MPG 137 12/02/2022   Lab Results  Component Value Date   PROLACTIN 61.5 (H) 06/05/2016   PROLACTIN 18.8 (H) 06/04/2016   Lab Results  Component Value Date   CHOL 166 03/11/2024   TRIG 102 03/11/2024   HDL 48 03/11/2024   CHOLHDL 3.5 03/11/2024   VLDL 20 03/11/2024   LDLCALC 98 03/11/2024   LDLCALC 91 02/24/2024    Physical Findings: AIMS:  , ,  ,  ,    CIWA:    COWS:      Psychiatric Specialty Exam:  Presentation  General Appearance:  Appropriate for Environment; Casual  Eye Contact: Fleeting  Speech: Clear and Coherent  Speech Volume: Decreased    Mood and Affect  Mood: Depressed; Dysphoric  Affect: Depressed; Flat   Thought Process  Thought Processes: Coherent  Descriptions of Associations:Intact  Orientation:Full (Time, Place and  Person)  Thought Content:WDL  Hallucinations:No data recorded Ideas of Reference:None  Suicidal Thoughts:No data recorded Homicidal Thoughts:No data recorded  Sensorium  Memory: Recent Fair; Immediate Good  Judgment: Fair  Insight: Fair   Chartered certified accountant: Fair  Attention Span: Fair  Recall: [employer] of Knowledge: Fair  Language: Fair   Psychomotor Activity  Psychomotor Activity:No data recorded Musculoskeletal: Strength & Muscle Tone: within normal limits Gait & Station: normal Assets  Assets: Manufacturing systems engineer; Desire for Improvement; Financial Resources/Insurance; Resilience; Leisure Time    Physical Exam: Physical Exam Vitals and nursing note reviewed.    ROS Blood pressure 120/71, pulse (!) 57, temperature 97.9 F (36.6  C), resp. rate 18, height 5' 6 (1.676 m), weight 73 kg, SpO2 100%. Body mass index is 25.98 kg/m.  Diagnosis: Principal Problem:   Schizoaffective disorder (HCC) Active Problems:   Substance-induced psychotic disorder with hallucinations (HCC)   Clinical Decision Making: Patient currently admitted to inpatient unit for worsening hallucinations and depression in the context of multiple psychosocial stressors including being homeless, substance use, relapse on crack cocaine.  Patient will be monitored closely for medication adjustments.   Treatment Plan Summary:   Safety and Monitoring:             -- Voluntary admission to inpatient psychiatric unit for safety, stabilization and treatment             -- Daily contact with patient to assess and evaluate symptoms and progress in treatment             -- Patient's case to be discussed in multi-disciplinary team meeting             -- Observation Level: q15 minute checks             -- Vital signs:  q12 hours             -- Precautions: suicide, elopement, and assault   2. Psychiatric Diagnoses and Treatment:   --Increase Sertraline  to 100mg   daily --Discontinue Zyprexa  15mg   -- Increased Risperidone  1mg  to BID    4. Discharge Planning:   -- Social work and case management to assist with discharge planning and identification of hospital follow-up needs prior to discharge  -- Estimated LOS: 3-4 days  Adiyah Lame, MD 03/13/2024, 1:30 PM

## 2024-03-13 NOTE — Plan of Care (Signed)
  Problem: Education: Goal: Will be free of psychotic symptoms Outcome: Progressing Goal: Knowledge of the prescribed therapeutic regimen will improve Outcome: Progressing   Problem: Coping: Goal: Coping ability will improve Outcome: Progressing Goal: Will verbalize feelings Outcome: Progressing   Problem: Health Behavior/Discharge Planning: Goal: Compliance with prescribed medication regimen will improve Outcome: Progressing   Problem: Nutritional: Goal: Ability to achieve adequate nutritional intake will improve Outcome: Progressing   Problem: Safety: Goal: Ability to redirect hostility and anger into socially appropriate behaviors will improve Outcome: Progressing Goal: Ability to remain free from injury will improve Outcome: Progressing   Problem: Self-Care: Goal: Ability to participate in self-care as condition permits will improve Outcome: Progressing   Problem: Education: Goal: Knowledge of Study [place] [facility] Education information/materials will improve Outcome: Progressing Goal: Emotional status will improve Outcome: Progressing Goal: Mental status will improve Outcome: Progressing Goal: Verbalization of understanding the information provided will improve Outcome: Progressing   Problem: Activity: Goal: Interest or engagement in activities will improve Outcome: Progressing Goal: Sleeping patterns will improve Outcome: Progressing   Problem: Coping: Goal: Ability to verbalize frustrations and anger appropriately will improve Outcome: Progressing Goal: Ability to demonstrate self-control will improve Outcome: Progressing   Problem: Health Behavior/Discharge Planning: Goal: Identification of resources available to assist in meeting health care needs will improve Outcome: Progressing Goal: Compliance with treatment plan for underlying cause of condition will improve Outcome: Progressing   Problem: Physical Regulation: Goal: Ability to maintain clinical  measurements within normal limits will improve Outcome: Progressing   Problem: Safety: Goal: Periods of time without injury will increase Outcome: Progressing   Problem: OP Suicidal Ideation Goal: LTG: Placement in appropriate level of care to safely address suicidal or parasuicidal crisis, based on clinical assessment Outcome: Progressing Goal: LTG: Akram will be free from thoughts of self-harm as evidenced by suicide assessment or self-reported score of ideations Outcome: Progressing Goal: STG: Educate Breckon on principles of suicide risk safety plan and levels of interventions Outcome: Progressing Goal: STG: Work with Sandra to create and implement personal suicide risk safety plan Outcome: Progressing

## 2024-03-13 NOTE — Group Note (Signed)
 Date:  03/13/2024 Time:  4:30 PM  Group Topic/Focus:  Outside Rec/Activities The purpose of this group is for patients to go outside and get fresh air while doing some outside activities with peers.    Participation Level:  Active  Participation Quality:  Appropriate  Affect:  Appropriate  Cognitive:  Appropriate  Insight: Appropriate  Engagement in Group:  Engaged  Modes of Intervention:  Activity  Additional Comments:    Charles Charles 03/13/2024, 4:30 PM

## 2024-03-13 NOTE — Group Note (Signed)
 Date:  03/13/2024 Time:  8:44 PM  Group Topic/Focus:  Making Healthy Choices:   The focus of this group is to help patients identify negative/unhealthy choices they were using prior to admission and identify positive/healthier coping strategies to replace them upon discharge.  MHT made introductions. MHT discussed expectations on the unit. MHT informed patients that techs would be checking in throughout the night and not to be alarmed if they see someone looking in. MHT asked patients about concerns on the unit. MHT discussed aftercare plans and informed of resources in the community. MHT encouraged continued treatment, informing of [facility]'s mental health services as well as calling their respective insurance companies for healthcare providers in their network. MHT provided supportive counseling to address questions on how to get linked with mental health services.  Participation Level:  Active  Participation Quality:  Appropriate  Affect:  Appropriate  Cognitive:  Appropriate  Insight: Appropriate  Engagement in Group:  Engaged  Modes of Intervention:  Discussion  Additional Comments:    Charles Charles 03/13/2024, 8:44 PM

## 2024-03-13 NOTE — Progress Notes (Signed)
   03/13/24 1400  Psych Admission Type (Psych Patients Only)  Admission Status Voluntary  Psychosocial Assessment  Patient Complaints Anxiety;Depression  Eye Contact Fair  Facial Expression Anxious  Affect Depressed  Speech Logical/coherent  Interaction Assertive  Motor Activity Slow  Appearance/Hygiene In scrubs  Behavior Characteristics Cooperative  Mood Depressed  Thought Process  Coherency WDL  Content WDL  Delusions None reported or observed  Perception Hallucinations  Hallucination Auditory;Command  Judgment Impaired  Confusion None  Danger to Self  Current suicidal ideation? Passive  Agreement Not to Harm Self Yes  Description of Agreement verbal  Danger to Others  Danger to Others None reported or observed

## 2024-03-13 NOTE — Group Note (Signed)
 Date:  03/13/2024 Time:  6:31 PM  Group Topic/Focus:  Coping With Mental Health Crisis:   The purpose of this group is to help patients identify strategies for coping with mental health crisis.  Group discusses possible causes of crisis and ways to manage them effectively. Self Care:   The focus of this group is to help patients understand the importance of self-care in order to improve or restore emotional, physical, spiritual, interpersonal, and financial health. Wellness Toolbox:   The focus of this group is to discuss various aspects of wellness, balancing those aspects and exploring ways to increase the ability to experience wellness.  Patients will create a wellness toolbox for use upon discharge.    Participation Level:  Active  Participation Quality:  Appropriate  Affect:  Appropriate  Cognitive:  Appropriate  Insight: Appropriate  Engagement in Group:  Engaged  Modes of Intervention:  Activity and Discussion  Additional Comments:    Charles Charles L Charles Charles 03/13/2024, 6:31 PM

## 2024-03-14 ENCOUNTER — Inpatient Hospital Stay: Payer: MEDICAID

## 2024-03-14 DIAGNOSIS — F259 Schizoaffective disorder, unspecified: Secondary | ICD-10-CM | POA: Diagnosis not present

## 2024-03-14 DIAGNOSIS — F19951 Other psychoactive substance use, unspecified with psychoactive substance-induced psychotic disorder with hallucinations: Secondary | ICD-10-CM | POA: Diagnosis not present

## 2024-03-14 MED ORDER — DICLOFENAC SODIUM 1 % EX GEL
2.0000 g | Freq: Four times a day (QID) | CUTANEOUS | Status: DC
  Administered 2024-03-14 – 2024-03-22 (×28): 2 g via TOPICAL
  Filled 2024-03-14: qty 100

## 2024-03-14 NOTE — Progress Notes (Signed)
 Patient complained of left elbow pain today - doctor ordered an xr and it was negative. Voltaren  gel ordered and administered. Patient should no signs of internal stimulation, participated well in group and interacts well with peers and staff. Will continue to monitor.

## 2024-03-14 NOTE — [facility] Counselor (Signed)
 CSW attempted to complete PSA. Patient was being transported to x-ray. Unable to complete.

## 2024-03-14 NOTE — Group Note (Signed)
 Date:  03/14/2024 Time:  11:06 AM  Group Topic/Focus:  Goals/Bingo Group:   The focus of this group is to help patients establish daily goals to achieve during treatment and discuss how the patient can incorporate goal setting into their daily lives to aide in recovery. Pts also played bingo and won small prizes     Participation Level:  Active  Participation Quality:  Appropriate  Affect:  Appropriate  Cognitive:  Appropriate  Insight: Appropriate  Engagement in Group:  Engaged  Modes of Intervention:  Activity and Discussion  Additional Comments:    Beatris ONEIDA Hasten 03/14/2024, 11:06 AM

## 2024-03-14 NOTE — Group Note (Signed)
 Date:  03/14/2024 Time:  10:25 PM  Group Topic/Focus:  Wrap-Up Group:   The focus of this group is to help patients review their daily goal of treatment and discuss progress on daily workbooks.    Participation Level:  Active  Participation Quality:  Appropriate  Affect:  Appropriate  Cognitive:  Alert  Insight: Appropriate  Engagement in Group:  Engaged  Modes of Intervention:  Discussion  Additional Comments:    Tommas CHRISTELLA Bunker 03/14/2024, 10:25 PM

## 2024-03-14 NOTE — Progress Notes (Signed)
 [facility] MD Progress Note  03/14/2024 1:36 PM JAYVAN MCSHAN  MRN:  [id number]  Prem Coykendall is a 63 year old male with a past medical history of HIV, CVA, hypothyroidism, COPD, GERD, MDD and alcohol and cocaine use who has been admitted from [facility] after attempting to walk onto oncoming traffic. He states that he hears voices telling him to hurt himself, kill himself, and sometimes to hurt anyone in his way.  He reports hearing male voices, command type.  He reports the voices been happening for a long time.   Subjective:  Chart reviewed, case discussed in multidisciplinary meeting, patient seen during rounds.  Patient offered no complaints.  Patient denies active SI/HI/plan.  Patient continues to endorse auditory hallucinations telling him to hurt himself but denies having any compulsion to do that.  Patient has fair appetite and sleep.  Per nursing no behavioral problems on the unit.  Sleep: Fair  Appetite:  Fair  Past Psychiatric History: see h&P Family History:  Family History  Problem Relation Age of Onset   Anxiety disorder Mother    Diabetes Mother    Diabetes Father    Social History:  Social History   Substance and Sexual Activity  Alcohol Use Yes   Alcohol/week: 56.0 standard drinks of alcohol   Types: 56 Cans of beer per week   Comment: 1 beer 2 xs a week - last drink 4-5 days ago     Social History   Substance and Sexual Activity  Drug Use Yes   Types: Cocaine, Marijuana   Comment: last use 3-4 days ago    Social History   Socioeconomic History   Marital status: Single    Spouse name: Not on file   Number of children: Not on file   Years of education: Not on file   Highest education level: 8th grade  Occupational History   Not on file  Tobacco Use   Smoking status: Every Day    Current packs/day: 1.00    Average packs/day: 1 pack/day for 0.7 years (0.7 ttl pk-yrs)    Types: Cigarettes    Start date: 2025   Smokeless tobacco: Never   Tobacco comments:    (1-2  cigs/day)  Vaping Use   Vaping status: Never Used  Substance and Sexual Activity   Alcohol use: Yes    Alcohol/week: 56.0 standard drinks of alcohol    Types: 56 Cans of beer per week    Comment: 1 beer 2 xs a week - last drink 4-5 days ago   Drug use: Yes    Types: Cocaine, Marijuana    Comment: last use 3-4 days ago   Sexual activity: Not Currently    Partners: Female  Other Topics Concern   Not on file  Social History Narrative   Not on file   Social Drivers of Health   Financial Resource Strain: High Risk (11/06/2022)   Received from [facility]   Overall Financial Resource Strain (CARDIA)    Difficulty of Paying Living Expenses: Hard  Food Insecurity: No Food Insecurity (03/11/2024)   Hunger Vital Sign    Worried About Running Out of Food in the Last Year: Never true    Ran Out of Food in the Last Year: Never true  Transportation Needs: Unmet Transportation Needs (03/11/2024)   PRAPARE - Administrator, Civil Service (Medical): Yes    Lack of Transportation (Non-Medical): Yes  Physical Activity: Not on file  Stress: Not on file  Social Connections: Not on file   Past Medical History:  Past Medical History:  Diagnosis Date   Alcohol abuse 01/18/2021   Alcohol use disorder, moderate, dependence (HCC) 04/10/2016   Cannabis use disorder, moderate, dependence (HCC) 05/06/2016   COPD (chronic obstructive pulmonary disease) (HCC) 12/03/2022   COPD exacerbation (HCC) 12/03/2022   Drug overdose 01/05/2022   GERD (gastroesophageal reflux disease)    Headache    History of syphilis 12/03/2022   Neutropenia (HCC) 12/03/2022   Schizo-affective schizophrenia (HCC)    Suicidal ideation 04/09/2016    Past Surgical History:  Procedure Laterality Date   COLONOSCOPY WITH PROPOFOL  N/A 08/04/2015   Procedure: COLONOSCOPY WITH PROPOFOL ;  Surgeon: Rogelia Copping, MD;  Location: [facility];  Service: Endoscopy;  Laterality: N/A;   INCISION AND DRAINAGE PERIRECTAL  ABSCESS N/A 07/14/2015   Procedure: IRRIGATION AND DEBRIDEMENT PERIRECTAL ABSCESS;  Surgeon: Charlie FORBES Fell, MD;  Location: [facility];  Service: General;  Laterality: N/A;   INCISION AND DRAINAGE PERIRECTAL ABSCESS N/A 08/14/2015   Procedure: IRRIGATION AND DEBRIDEMENT PERIRECTAL ABSCESS;  Surgeon: Dorothyann LITTIE Husk, MD;  Location: [facility];  Service: General;  Laterality: N/A;    Current Medications: Current Facility-Administered Medications  Medication Dose Route Frequency Provider Last Rate Last Admin   acetaminophen  (TYLENOL ) tablet 650 mg  650 mg Oral Q6H PRN Brent, Amanda C, NP   650 mg at 03/12/24 1400   albuterol  (VENTOLIN  HFA) 108 (90 Base) MCG/ACT inhaler 2 puff  2 puff Inhalation Q6H PRN Brent, Amanda C, NP       alum & mag hydroxide-simeth (MAALOX/MYLANTA) 200-200-20 MG/5ML suspension 30 mL  30 mL Oral Q4H PRN Brent, Amanda C, NP       doravirin-lamivudin-tenofov df (DELSTRIGO ) 100-300-300 MG per tablet 1 tablet  1 tablet Oral Daily Via Rosado, MD   1 tablet at 03/14/24 9056   hydrOXYzine  (ATARAX ) tablet 25 mg  25 mg Oral TID PRN Brent, Amanda C, NP       magnesium  hydroxide (MILK OF MAGNESIA) suspension 30 mL  30 mL Oral Daily PRN Brent, Amanda C, NP       OLANZapine  (ZYPREXA ) injection 5 mg  5 mg Intramuscular TID PRN Brent, Amanda C, NP       OLANZapine  zydis (ZYPREXA ) disintegrating tablet 5 mg  5 mg Oral TID PRN Brent, Amanda C, NP       pantoprazole  (PROTONIX ) EC tablet 20 mg  20 mg Oral Daily Brent, Amanda C, NP   20 mg at 03/14/24 0741   risperiDONE  (RISPERDAL  M-TABS) disintegrating tablet 1 mg  1 mg Oral BID Keno Caraway, MD   1 mg at 03/14/24 9072   sertraline  (ZOLOFT ) tablet 100 mg  100 mg Oral Daily Annelisa Ryback, MD   100 mg at 03/14/24 9070    Lab Results:  No results found for this or any previous visit (from the past 48 hours).   Blood Alcohol level:  Lab Results  Component Value Date   ETH <15 03/11/2024   ETH 185 (H) 11/10/2023    Metabolic  Disorder Labs: Lab Results  Component Value Date   HGBA1C 5.7 (H) 03/11/2024   MPG 116.89 03/11/2024   MPG 137 12/02/2022   Lab Results  Component Value Date   PROLACTIN 61.5 (H) 06/05/2016   PROLACTIN 18.8 (H) 06/04/2016   Lab Results  Component Value Date   CHOL 166 03/11/2024   TRIG 102 03/11/2024   HDL 48 03/11/2024   CHOLHDL 3.5 03/11/2024  VLDL 20 03/11/2024   LDLCALC 98 03/11/2024   LDLCALC 91 02/24/2024    Physical Findings: AIMS:  , ,  ,  ,    CIWA:    COWS:      Psychiatric Specialty Exam:  Presentation  General Appearance:  Appropriate for Environment; Casual  Eye Contact: Fleeting  Speech: Clear and Coherent  Speech Volume: Decreased    Mood and Affect  Mood: Depressed; Dysphoric  Affect: Depressed; Flat   Thought Process  Thought Processes: Coherent  Descriptions of Associations:Intact  Orientation:Full (Time, Place and Person)  Thought Content:WDL  Hallucinations: Denies Ideas of Reference:None  Suicidal Thoughts: Denies Homicidal Thoughts: Denies  Sensorium  Memory: Recent Fair; Immediate Good  Judgment: Fair  Insight: Fair   Chartered certified accountant: Fair  Attention Span: Fair  Recall: [employer] of Knowledge: Fair  Language: Fair   Psychomotor Activity  Psychomotor Activity:No data recorded Musculoskeletal: Strength & Muscle Tone: within normal limits Gait & Station: normal Assets  Assets: Manufacturing systems engineer; Desire for Improvement; Financial Resources/Insurance; Resilience; Leisure Time    Physical Exam: Physical Exam Vitals and nursing note reviewed.    ROS Blood pressure 114/76, pulse 63, temperature 97.9 F (36.6 C), resp. rate 17, height 5' 6 (1.676 m), weight 73 kg, SpO2 100%. Body mass index is 25.98 kg/m.  Diagnosis: Principal Problem:   Schizoaffective disorder (HCC) Active Problems:   Substance-induced psychotic disorder with hallucinations  (HCC)   Clinical Decision Making: Patient currently admitted to inpatient unit for worsening hallucinations and depression in the context of multiple psychosocial stressors including being homeless, substance use, relapse on crack cocaine.  Patient will be monitored closely for medication adjustments.   Treatment Plan Summary:   Safety and Monitoring:             -- Voluntary admission to inpatient psychiatric unit for safety, stabilization and treatment             -- Daily contact with patient to assess and evaluate symptoms and progress in treatment             -- Patient's case to be discussed in multi-disciplinary team meeting             -- Observation Level: q15 minute checks             -- Vital signs:  q12 hours             -- Precautions: suicide, elopement, and assault   2. Psychiatric Diagnoses and Treatment:   --Increase Sertraline  to 100mg  daily --Discontinue Zyprexa  15mg   -- Increased Risperidone  1mg  to BID    4. Discharge Planning:   -- Social work and case management to assist with discharge planning and identification of hospital follow-up needs prior to discharge  -- Estimated LOS: 3-4 days  Jonta Gastineau, MD 03/14/2024, 1:36 PM

## 2024-03-14 NOTE — Group Note (Signed)
 Date:  03/14/2024 Time:  5:17 PM  Group Topic/Focus:  Healthy Communication:   The focus of this group is to discuss communication, barriers to communication, as well as healthy ways to communicate with others.    Participation Level:  Active  Participation Quality:  Appropriate  Affect:  Appropriate  Cognitive:  Alert  Insight: Appropriate  Engagement in Group:  Engaged  Modes of Intervention:  Activity  Additional Comments:  N/A  Harlene LITTIE Gavel 03/14/2024, 5:17 PM

## 2024-03-15 DIAGNOSIS — F259 Schizoaffective disorder, unspecified: Secondary | ICD-10-CM | POA: Diagnosis not present

## 2024-03-15 DIAGNOSIS — F19951 Other psychoactive substance use, unspecified with psychoactive substance-induced psychotic disorder with hallucinations: Secondary | ICD-10-CM | POA: Diagnosis not present

## 2024-03-15 MED ORDER — RISPERIDONE 1 MG PO TABS
2.0000 mg | ORAL_TABLET | Freq: Every day | ORAL | Status: DC
Start: 2024-03-15 — End: 2024-03-16
  Administered 2024-03-15: 2 mg via ORAL
  Filled 2024-03-15 (×2): qty 2

## 2024-03-15 MED ORDER — RISPERIDONE 1 MG PO TABS
1.0000 mg | ORAL_TABLET | Freq: Every morning | ORAL | Status: DC
Start: 2024-03-16 — End: 2024-03-16
  Administered 2024-03-16: 1 mg via ORAL
  Filled 2024-03-15: qty 1

## 2024-03-15 NOTE — Progress Notes (Signed)
 [facility] MD Progress Note  03/15/2024 10:16 AM Charles Charles  MRN:  [id number]  Charles Charles is a 63 year old male with a past medical history of HIV, CVA, hypothyroidism, COPD, GERD, MDD and alcohol and cocaine use who has been admitted from [facility] after attempting to walk onto oncoming traffic. He states that he hears voices telling him to hurt himself, kill himself, and sometimes to hurt anyone in his way.  He reports hearing male voices, command type.  He reports the voices been happening for a long time.   Subjective:  Chart reviewed, case discussed in multidisciplinary meeting, patient seen during rounds.  Patient offered no complaints. Patient denies active SI/HI/plan. Patient continues to endorse auditory hallucinations telling him to hurt himself but denies having any compulsion to do that. States he is eating 3 meals a day and is sleeping good. He has been compliant with his medication and is attending group therapy. He denies withdrawal symptoms. Per nursing no behavioral problems on the unit.  Sleep: Fair  Appetite:  Good  Past Psychiatric History: see H&P Family History:  Family History  Problem Relation Age of Onset   Anxiety disorder Mother    Diabetes Mother    Diabetes Father    Social History:  Social History   Substance and Sexual Activity  Alcohol Use Yes   Alcohol/week: 56.0 standard drinks of alcohol   Types: 56 Cans of beer per week   Comment: 1 beer 2 xs a week - last drink 4-5 days ago     Social History   Substance and Sexual Activity  Drug Use Yes   Types: Cocaine, Marijuana   Comment: last use 3-4 days ago    Social History   Socioeconomic History   Marital status: Single    Spouse name: Not on file   Number of children: Not on file   Years of education: Not on file   Highest education level: 8th grade  Occupational History   Not on file  Tobacco Use   Smoking status: Every Day    Current packs/day: 1.00    Average packs/day: 1 pack/day for 0.7  years (0.7 ttl pk-yrs)    Types: Cigarettes    Start date: 2025   Smokeless tobacco: Never   Tobacco comments:    (1-2 cigs/day)  Vaping Use   Vaping status: Never Used  Substance and Sexual Activity   Alcohol use: Yes    Alcohol/week: 56.0 standard drinks of alcohol    Types: 56 Cans of beer per week    Comment: 1 beer 2 xs a week - last drink 4-5 days ago   Drug use: Yes    Types: Cocaine, Marijuana    Comment: last use 3-4 days ago   Sexual activity: Not Currently    Partners: Female  Other Topics Concern   Not on file  Social History Narrative   Not on file   Social Drivers of Health   Financial Resource Strain: High Risk (11/06/2022)   Received from [facility]   Overall Financial Resource Strain (CARDIA)    Difficulty of Paying Living Expenses: Hard  Food Insecurity: No Food Insecurity (03/11/2024)   Hunger Vital Sign    Worried About Running Out of Food in the Last Year: Never true    Ran Out of Food in the Last Year: Never true  Transportation Needs: Unmet Transportation Needs (03/11/2024)   PRAPARE - Administrator, Civil Service (Medical): Yes  Lack of Transportation (Non-Medical): Yes  Physical Activity: Not on file  Stress: Not on file  Social Connections: Not on file   Past Medical History:  Past Medical History:  Diagnosis Date   Alcohol abuse 01/18/2021   Alcohol use disorder, moderate, dependence (HCC) 04/10/2016   Cannabis use disorder, moderate, dependence (HCC) 05/06/2016   COPD (chronic obstructive pulmonary disease) (HCC) 12/03/2022   COPD exacerbation (HCC) 12/03/2022   Drug overdose 01/05/2022   GERD (gastroesophageal reflux disease)    Headache    History of syphilis 12/03/2022   Neutropenia (HCC) 12/03/2022   Schizo-affective schizophrenia (HCC)    Suicidal ideation 04/09/2016    Past Surgical History:  Procedure Laterality Date   COLONOSCOPY WITH PROPOFOL  N/A 08/04/2015   Procedure: COLONOSCOPY WITH PROPOFOL ;  Surgeon:  Rogelia Copping, MD;  Location: [facility];  Service: Endoscopy;  Laterality: N/A;   INCISION AND DRAINAGE PERIRECTAL ABSCESS N/A 07/14/2015   Procedure: IRRIGATION AND DEBRIDEMENT PERIRECTAL ABSCESS;  Surgeon: Charlie FORBES Fell, MD;  Location: [facility];  Service: General;  Laterality: N/A;   INCISION AND DRAINAGE PERIRECTAL ABSCESS N/A 08/14/2015   Procedure: IRRIGATION AND DEBRIDEMENT PERIRECTAL ABSCESS;  Surgeon: Dorothyann LITTIE Husk, MD;  Location: [facility];  Service: General;  Laterality: N/A;    Current Medications: Current Facility-Administered Medications  Medication Dose Route Frequency Provider Last Rate Last Admin   acetaminophen  (TYLENOL ) tablet 650 mg  650 mg Oral Q6H PRN Brent, Amanda C, NP   650 mg at 03/12/24 1400   albuterol  (VENTOLIN  HFA) 108 (90 Base) MCG/ACT inhaler 2 puff  2 puff Inhalation Q6H PRN Brent, Amanda C, NP       alum & mag hydroxide-simeth (MAALOX/MYLANTA) 200-200-20 MG/5ML suspension 30 mL  30 mL Oral Q4H PRN Brent, Amanda C, NP       diclofenac  Sodium (VOLTAREN ) 1 % topical gel 2 g  2 g Topical QID Juliann Olesky, MD   2 g at 03/15/24 0941   doravirin-lamivudin-tenofov df (DELSTRIGO ) 100-300-300 MG per tablet 1 tablet  1 tablet Oral Daily Taylore Hinde, MD   1 tablet at 03/15/24 0941   hydrOXYzine  (ATARAX ) tablet 25 mg  25 mg Oral TID PRN Brent, Amanda C, NP       magnesium  hydroxide (MILK OF MAGNESIA) suspension 30 mL  30 mL Oral Daily PRN Brent, Amanda C, NP       OLANZapine  (ZYPREXA ) injection 5 mg  5 mg Intramuscular TID PRN Brent, Amanda C, NP       OLANZapine  zydis (ZYPREXA ) disintegrating tablet 5 mg  5 mg Oral TID PRN Brent, Amanda C, NP       pantoprazole  (PROTONIX ) EC tablet 20 mg  20 mg Oral Daily Brent, Amanda C, NP   20 mg at 03/15/24 0941   risperiDONE  (RISPERDAL  M-TABS) disintegrating tablet 1 mg  1 mg Oral BID Jazmene Racz, MD   1 mg at 03/15/24 0941   sertraline  (ZOLOFT ) tablet 100 mg  100 mg Oral Daily Mckenzie Bove, MD   100 mg at  03/15/24 0941    Lab Results:  No results found for this or any previous visit (from the past 48 hours).   Blood Alcohol level:  Lab Results  Component Value Date   [facility] <15 03/11/2024   ETH 185 (H) 11/10/2023    Metabolic Disorder Labs: Lab Results  Component Value Date   HGBA1C 5.7 (H) 03/11/2024   MPG 116.89 03/11/2024   MPG 137 12/02/2022   Lab Results  Component Value  Date   PROLACTIN 61.5 (H) 06/05/2016   PROLACTIN 18.8 (H) 06/04/2016   Lab Results  Component Value Date   CHOL 166 03/11/2024   TRIG 102 03/11/2024   HDL 48 03/11/2024   CHOLHDL 3.5 03/11/2024   VLDL 20 03/11/2024   LDLCALC 98 03/11/2024   LDLCALC 91 02/24/2024    Physical Findings: AIMS:  , ,  ,  ,    CIWA:    COWS:      Psychiatric Specialty Exam:  Presentation  General Appearance:  Appropriate for Environment; Casual  Eye Contact: Fleeting  Speech: Clear and Coherent  Speech Volume: Decreased    Mood and Affect  Mood: Depressed; Dysphoric  Affect: Depressed; Flat   Thought Process  Thought Processes: Coherent  Descriptions of Associations:Intact  Orientation:Full (Time, Place and Person)  Thought Content:WDL  Hallucinations: Denies Ideas of Reference:None  Suicidal Thoughts: Denies Homicidal Thoughts: Denies  Sensorium  Memory: Recent Fair; Immediate Good  Judgment: Fair  Insight: Fair   Chartered certified accountant: Fair  Attention Span: Fair  Recall: [employer] of Knowledge: Fair  Language: Fair   Psychomotor Activity  Psychomotor Activity:No data recorded Musculoskeletal: Strength & Muscle Tone: within normal limits Gait & Station: normal Assets  Assets: Manufacturing systems engineer; Desire for Improvement; Financial Resources/Insurance; Resilience; Leisure Time    Physical Exam: Physical Exam Vitals and nursing note reviewed.  Constitutional:      Appearance: Normal appearance. He is normal weight.  HENT:     Head:  Normocephalic.     Nose: Nose normal.     Mouth/Throat:     Mouth: Mucous membranes are moist.  Eyes:     Conjunctiva/sclera: Conjunctivae normal.  Pulmonary:     Effort: Pulmonary effort is normal.  Musculoskeletal:        General: Normal range of motion.     Cervical back: Normal range of motion.  Neurological:     Mental Status: He is alert and oriented to person, place, and time.  Psychiatric:        Mood and Affect: Mood normal.    Review of Systems  Constitutional:  Negative for chills, fever, malaise/fatigue and weight loss.  Musculoskeletal:  Positive for joint pain.  Psychiatric/Behavioral:  Positive for hallucinations. Negative for suicidal ideas.    Blood pressure 120/72, pulse 62, temperature 98 F (36.7 C), resp. rate 18, height 5' 6 (1.676 m), weight 73 kg, SpO2 99%. Body mass index is 25.98 kg/m.  Diagnosis: Principal Problem:   Schizoaffective disorder (HCC) Active Problems:   Substance-induced psychotic disorder with hallucinations (HCC)   Clinical Decision Making: Patient currently admitted to inpatient unit for worsening hallucinations and depression in the context of multiple psychosocial stressors including being homeless, substance use, relapse on crack cocaine.  Patient will be monitored closely for medication adjustments.   Treatment Plan Summary:   Safety and Monitoring:             -- Voluntary admission to inpatient psychiatric unit for safety, stabilization and treatment             -- Daily contact with patient to assess and evaluate symptoms and progress in treatment             -- Patient's case to be discussed in multi-disciplinary team meeting             -- Observation Level: q15 minute checks             -- Vital signs:  q12 hours             -- Precautions: suicide, elopement, and assault   2. Psychiatric Diagnoses and Treatment:   --Continue Sertraline   100mg  daily -- Increase Risperidone  to 1mg  in morning and 2mg  at night   3.  Medical Issues Being Addressed: -- Left elbow Xray normal, continue Voltaren  gel prn pain.   4. Discharge Planning:   -- Social work and case management to assist with discharge planning and identification of hospital follow-up needs prior to discharge  -- Estimated LOS: 3-4 days  [employer], Student-PA 03/15/2024, 10:16 AM

## 2024-03-15 NOTE — Plan of Care (Signed)
   Problem: Education: Goal: Knowledge of [facility] Education information/materials will improve Outcome: Progressing Goal: Verbalization of understanding the information provided will improve Outcome: Progressing

## 2024-03-15 NOTE — Plan of Care (Signed)
  Problem: Education: Goal: Will be free of psychotic symptoms Outcome: Progressing Goal: Knowledge of the prescribed therapeutic regimen will improve Outcome: Progressing   Problem: Coping: Goal: Coping ability will improve Outcome: Progressing Goal: Will verbalize feelings Outcome: Progressing   Problem: Health Behavior/Discharge Planning: Goal: Compliance with prescribed medication regimen will improve Outcome: Progressing   Problem: Nutritional: Goal: Ability to achieve adequate nutritional intake will improve Outcome: Progressing   Problem: Safety: Goal: Ability to redirect hostility and anger into socially appropriate behaviors will improve Outcome: Progressing Goal: Ability to remain free from injury will improve Outcome: Progressing   Problem: Self-Care: Goal: Ability to participate in self-care as condition permits will improve Outcome: Progressing   Problem: Education: Goal: Knowledge of Study [place] [facility] Education information/materials will improve Outcome: Progressing Goal: Emotional status will improve Outcome: Progressing Goal: Mental status will improve Outcome: Progressing Goal: Verbalization of understanding the information provided will improve Outcome: Progressing   Problem: Activity: Goal: Interest or engagement in activities will improve Outcome: Progressing Goal: Sleeping patterns will improve Outcome: Progressing   Problem: Coping: Goal: Ability to verbalize frustrations and anger appropriately will improve Outcome: Progressing Goal: Ability to demonstrate self-control will improve Outcome: Progressing   Problem: Health Behavior/Discharge Planning: Goal: Identification of resources available to assist in meeting health care needs will improve Outcome: Progressing Goal: Compliance with treatment plan for underlying cause of condition will improve Outcome: Progressing   Problem: Physical Regulation: Goal: Ability to maintain clinical  measurements within normal limits will improve Outcome: Progressing   Problem: Safety: Goal: Periods of time without injury will increase Outcome: Progressing   Problem: OP Suicidal Ideation Goal: LTG: Placement in appropriate level of care to safely address suicidal or parasuicidal crisis, based on clinical assessment Outcome: Progressing Goal: LTG: Akram will be free from thoughts of self-harm as evidenced by suicide assessment or self-reported score of ideations Outcome: Progressing Goal: STG: Educate Breckon on principles of suicide risk safety plan and levels of interventions Outcome: Progressing Goal: STG: Work with Sandra to create and implement personal suicide risk safety plan Outcome: Progressing

## 2024-03-15 NOTE — Group Note (Signed)
 Date:  03/15/2024 Time:  9:28 PM  Group Topic/Focus:  Wrap-Up Group:   The focus of this group is to help patients review their daily goal of treatment and discuss progress on daily workbooks.    Participation Level:  Active  Participation Quality:  Appropriate  Affect:  Appropriate  Cognitive:  Appropriate  Insight: Appropriate  Engagement in Group:  Engaged  Modes of Intervention:  Discussion  Additional Comments:    Charles Charles 03/15/2024, 9:28 PM

## 2024-03-15 NOTE — Plan of Care (Signed)
  Problem: Health Behavior/Discharge Planning: Goal: Identification of resources available to assist in meeting health care needs will improve Outcome: Progressing   Problem: Health Behavior/Discharge Planning: Goal: Compliance with treatment plan for underlying cause of condition will improve Outcome: Progressing

## 2024-03-15 NOTE — Group Note (Signed)
 Date:  03/15/2024 Time:  1:10 PM  Group Topic/Focus:  Building Self Esteem:   The Focus of this group is helping patients become aware of the effects of self-esteem on their lives, the things they and others do that enhance or undermine their self-esteem, seeing the relationship between their level of self-esteem and the choices they make and learning ways to enhance self-esteem.    Participation Level:  Active  Participation Quality:  Appropriate  Affect:  Appropriate  Cognitive:  Alert  Insight: Appropriate  Engagement in Group:  Engaged  Modes of Intervention:  Activity  Additional Comments:  N/A  Harlene LITTIE Gavel 03/15/2024, 1:10 PM

## 2024-03-15 NOTE — Group Note (Signed)
 Physical/Occupational Therapy Group Note  Group Topic: Yoga  Group Date: 03/15/2024 Start Time: 1305 End Time: 1335 Facilitators: Brylan Dec, Alm Hamilton, PT   Group Description: Group participated with series of yoga poses, designed to emphasize functional sitting balance, core stability, generalized flexibility and overall posture.  Incorporated deep breathing techniques with poses, working to promote relaxation, mindfulness and focus with targeted activities.   Discussed benefits of yoga in improving mood and self-esteem, reducing stress and anxiety, and promoting functional strength and balance for each participant.  Discussed ways to integrate into each participant's daily routine.  Provided handout with written and pictorial descriptions of included yoga movements to be utilized as appropriate outside of group time.  Therapeutic Goal(s):  Demonstrate safe ability to participate with yoga poses during group activity. Identify one benefit of participation with yoga poses as part of each participant's exercise/movement routine. Identify 1-2 individual poses that participant feels most beneficial to his/her needs and that he/she can easily replicate outside of group.  Individual Participation: Pt participated minimally with the discussion portion of the session but was very engaged and active with the physical activity portion.  Pt able to modify several activities to his comfort level with only minimal cuing.    Participation Level: Active and Engaged   Participation Quality: Minimal Cues   Behavior: Appropriate   Speech/Thought Process: Minimal verbal communication during the session    Affect/Mood: Appropriate   Insight: Good   Judgement: Good   Modes of Intervention: Activity, Discussion, and Education  Patient Response to Interventions:  Attentive and Engaged   Plan: Continue to engage patient in PT/OT groups 1 - 2x/week.  CHARM Hamilton Bertin PT, DPT 03/15/24, 2:03 PM

## 2024-03-15 NOTE — [facility] Suicide Risk Assessment (Signed)
 [facility] INPATIENT:  Family/Significant Other Suicide Prevention Education  Suicide Prevention Education:  Patient Refusal for Family/Significant Other Suicide Prevention Education: The patient Charles Charles has refused to provide written consent for family/significant other to be provided Family/Significant Other Suicide Prevention Education during admission and/or prior to discharge.  Physician notified.  SPE completed with pt, as pt refused to consent to family contact. SPI pamphlet provided to pt and pt was encouraged to share information with support network, ask questions, and talk about any concerns relating to SPE. Pt denies access to guns/firearms and verbalized understanding of information provided. Mobile Crisis information also provided to pt.   Sherryle JINNY Margo 03/15/2024, 1:22 PM

## 2024-03-15 NOTE — [facility] Counselor (Signed)
 Pt declined collateral contact.  Sherryle Margo, MSW, LCSW 03/15/2024 1:22 PM

## 2024-03-15 NOTE — Progress Notes (Signed)
   03/15/24 2000  Psych Admission Type (Psych Patients Only)  Admission Status Voluntary  Psychosocial Assessment  Patient Complaints Depression  Eye Contact Fair  Facial Expression Flat  Affect Depressed  Speech Logical/coherent  Interaction Assertive  Motor Activity Slow  Appearance/Hygiene In scrubs  Behavior Characteristics Cooperative  Mood Depressed  Thought Process  Coherency WDL  Content WDL  Delusions None reported or observed  Perception Hallucinations  Hallucination Auditory  Judgment Impaired  Confusion None  Danger to Self  Current suicidal ideation? Denies  Agreement Not to Harm Self Yes  Description of Agreement verbal

## 2024-03-15 NOTE — Progress Notes (Signed)
   03/15/24 1200  Psych Admission Type (Psych Patients Only)  Admission Status Voluntary  Psychosocial Assessment  Patient Complaints Depression  Eye Contact Fair  Facial Expression Flat  Affect Depressed  Speech Logical/coherent  Interaction Assertive  Motor Activity Slow  Appearance/Hygiene In scrubs  Behavior Characteristics Cooperative  Mood Depressed  Thought Process  Coherency WDL  Content WDL  Delusions None reported or observed  Perception Hallucinations  Hallucination Auditory  Judgment Impaired  Confusion None  Danger to Self  Current suicidal ideation? Denies  Agreement Not to Harm Self Yes  Description of Agreement verbal

## 2024-03-15 NOTE — [facility] Counselor (Signed)
 Adult Comprehensive Assessment  Patient ID: Charles Charles, male   DOB: December 24, 1960, 63 y.o.   MRN: [id number]  Information Source: Information source: Patient  Current Stressors:  Patient states their primary concerns and needs for treatment are:: voices Patient states their goals for this hospitilization and ongoing recovery are:: try and stay on my meds Educational / Learning stressors: Pt denies. Employment / Job issues: Pt denies. Family Relationships: Pt denies. Financial / Lack of resources (include bankruptcy): Pt denies. Housing / Lack of housing: Pt denies. Physical health (include injuries & life threatening diseases): Pt denies. Social relationships: Pt denies. Substance abuse: cocaine and better Bereavement / Loss: my dad, my sister  Living/Environment/Situation:  Living Arrangements: Other (Comment) (homeless) Living conditions (as described by patient or guardian): WNL Who else lives in the home?: friend How long has patient lived in current situation?: almost a week What is atmosphere in current home: Comfortable  Family History:  Marital status: Single Does patient have children?: Yes How many children?: 8 How is patient's relationship with their children?: so so  Childhood History:  By whom was/is the patient raised?: Both parents Description of patient's relationship with caregiver when they were a child: it was all right Patient's description of current relationship with people who raised him/her: it's off and on How were you disciplined when you got in trouble as a child/adolescent?: whooped Does patient have siblings?: Yes Number of Siblings: 1 Description of patient's current relationship with siblings: Pt reports that sister has passed.  Reports that relationship with brother we talk when we see each other Did patient suffer any verbal/emotional/physical/sexual abuse as a child?: No Did patient suffer from severe childhood neglect?:  No Has patient ever been sexually abused/assaulted/raped as an adolescent or adult?: No Was the patient ever a victim of a crime or a disaster?: No Witnessed domestic violence?: No Has patient been affected by domestic violence as an adult?: No  Education:  Highest grade of school patient has completed: 9th grade Currently a student?: No Learning disability?: Yes What learning problems does patient have?: I was in special ed classes  Employment/Work Situation:   Employment Situation: On disability Why is Patient on Disability: mental health How Long has Patient Been on Disability: since 1995 What is the Longest Time Patient has Held a Job?: Pt reports that he does not remember. Where was the Patient Employed at that Time?: [employer] Has Patient ever Been in the [employer]?: No  Financial Resources:   Financial resources: Medicaid Does patient have a Lawyer or guardian?: No  Alcohol/Substance Abuse:   What has been your use of drugs/alcohol within the last 12 months?: Cocaine: once a week, about a $100 worth Alcohol: I just drink beer once a month, almost a 12 pack, takes a month to drink all of the beer If attempted suicide, did drugs/alcohol play a role in this?: No Alcohol/Substance Abuse Treatment Hx: Past Tx, Inpatient Has alcohol/substance abuse ever caused legal problems?: No  Social Support System:   Patient's Community Support System: None Describe Community Support System: Pt denies. Type of faith/religion: Chrisitan How does patient's faith help to cope with current illness?: I go to church  Leisure/Recreation:   Do You Have Hobbies?: No  Strengths/Needs:   What is the patient's perception of their strengths?: Pt denies. Patient states they can use these personal strengths during their treatment to contribute to their recovery: Pt denies. Patient states these barriers may affect/interfere with their treatment: Pt denies. Patient states these  barriers may affect their return to the community: Pt denies. Other important information patient would like considered in planning for their treatment: Pt denies.  Discharge Plan:   Currently receiving community mental health services: No Patient states concerns and preferences for aftercare planning are: Pt reports that he is seeking outpatient. Patient states they will know when they are safe and ready for discharge when: I can just tell Does patient have access to transportation?: No Plan for no access to transportation at discharge: CSW to assist with transportation needs. Will patient be returning to same living situation after discharge?: Yes  Summary/Recommendations:   Summary and Recommendations (to be completed by the evaluator): Patient is a 63 year old male from [place], [place] [facility] [place]).  He presents to the hospital for concerns of increase in auditory hallucinations.  He reports that the voices were telling him to hurt himself and if anyone else gets in his way to hurt them too.  At admission he reports that he is homeless and that this was a trigger to his current mental health state, however, upon completing assessment with this clinician patient reports that he has been staying with a peer and is able to return to the home of the peer at discharge.  He reports that he does not have a current mental health provider, however, is open to a referral at discharge.  He reports that additional triggers to his current mental health state has been his substance use.  He reports that he is not open to being referred for residential to address his substance use at this time.  Recommendations include: Crisis stabilization, therapeutic milieu, encourage group attendance and participation, medication management for mood stabilization and development of comprehensive mental wellness plan.  Sherryle JINNY Margo. 03/15/2024

## 2024-03-15 NOTE — Progress Notes (Signed)
   03/14/24 2000  Psych Admission Type (Psych Patients Only)  Admission Status Voluntary  Psychosocial Assessment  Patient Complaints Depression  Eye Contact Fair  Facial Expression Flat  Affect Depressed  Speech Logical/coherent  Interaction Assertive  Motor Activity Slow  Appearance/Hygiene In scrubs  Behavior Characteristics Cooperative  Mood Depressed  Thought Process  Coherency WDL  Content WDL  Delusions None reported or observed  Perception Hallucinations  Hallucination Auditory  Judgment Impaired  Confusion None  Danger to Self  Current suicidal ideation? Denies  Self-Injurious Behavior No self-injurious ideation or behavior indicators observed or expressed   Agreement Not to Harm Self Yes  Description of Agreement verbal  Danger to Others  Danger to Others None reported or observed

## 2024-03-15 NOTE — Group Note (Signed)
 Recreation Therapy Group Note   Group Topic:Relaxation  Group Date: 03/15/2024 Start Time: 1500 End Time: 1535 Facilitators: Celestia Jeoffrey BRAVO, LRT, CTRS Location: Dayroom  Group Description: PMR (Progressive Muscle Relaxation). LRT educates patients on what PMR is and the benefits that come from it. Patients are asked to sit with their feet flat on the floor while sitting up and all the way back in their chair, if possible. LRT and pts follow a prompt through a speaker that requires you to tense and release different muscles in their body and focus on their breathing. During session, lights are off and soft music is being played. Pts are given a stress ball to use if needed.   Goal Area(s) Addressed:  Patients will be able to describe progressive muscle relaxation.  Patient will practice using relaxation technique. Patient will identify a new coping skill.  Patient will follow multistep directions to reduce anxiety and stress.   Affect/Mood: Appropriate and Flat   Participation Level: Minimal    Clinical Observations/Individualized Feedback: Jashad was present in the dayroom at the time of group. Pt did not complete any of the exercises as prompted.   Plan: Continue to engage patient in RT group sessions 2-3x/week.   Jeoffrey BRAVO Celestia, LRT, CTRS 03/15/2024 5:10 PM

## 2024-03-15 NOTE — Group Note (Signed)
 Recreation Therapy Group Note   Group Topic:General Recreation  Group Date: 03/15/2024 Start Time: 1535 End Time: 1630 Facilitators: Celestia Jeoffrey BRAVO, LRT, CTRS Location: Courtyard  Group Description: Outdoor Recreation. Patients had the option to play corn hole, ring toss, UNO, or listening to music while outside in the courtyard getting fresh air and sunlight. Patients helped water  and prune the raised garden beds. LRT and patients discussed things that they enjoy doing in their free time outside of the hospital. LRT encouraged patients to drink water  after being active and getting their heart rate up.   Goal Area(s) Addressed: Patient will identify leisure interests.  Patient will practice healthy decision making. Patient will engage in recreation activity.   Affect/Mood: Appropriate   Participation Level: Active   Participation Quality: Independent   Behavior: Appropriate   Speech/Thought Process: Coherent   Insight: Good   Judgement: Good   Modes of Intervention: Activity   Patient Response to Interventions:  Receptive   Education Outcome:  Acknowledges education   Clinical Observations/Individualized Feedback: Charles Charles was active in their participation of session activities and group discussion. Pt interacted well with LRT and peers duration of session.    Plan: Continue to engage patient in RT group sessions 2-3x/week.   Jeoffrey BRAVO Celestia, LRT, CTRS 03/15/2024 5:35 PM

## 2024-03-16 DIAGNOSIS — F259 Schizoaffective disorder, unspecified: Secondary | ICD-10-CM | POA: Diagnosis not present

## 2024-03-16 DIAGNOSIS — F19951 Other psychoactive substance use, unspecified with psychoactive substance-induced psychotic disorder with hallucinations: Secondary | ICD-10-CM | POA: Diagnosis not present

## 2024-03-16 MED ORDER — DIPHENHYDRAMINE HCL 25 MG PO CAPS
25.0000 mg | ORAL_CAPSULE | Freq: Four times a day (QID) | ORAL | Status: DC | PRN
  Administered 2024-03-16 – 2024-03-23 (×9): 25 mg via ORAL
  Filled 2024-03-16 (×11): qty 1

## 2024-03-16 MED ORDER — RISPERIDONE 1 MG PO TABS
3.0000 mg | ORAL_TABLET | Freq: Two times a day (BID) | ORAL | Status: DC
  Administered 2024-03-16 – 2024-03-23 (×14): 3 mg via ORAL
  Filled 2024-03-16 (×14): qty 3

## 2024-03-16 NOTE — Progress Notes (Signed)
   03/16/24 1200  Psych Admission Type (Psych Patients Only)  Admission Status Voluntary  Psychosocial Assessment  Patient Complaints None  Eye Contact Fair  Facial Expression Flat  Affect Depressed  Speech Logical/coherent  Interaction Assertive  Motor Activity Slow  Appearance/Hygiene In scrubs  Behavior Characteristics Cooperative  Mood Depressed  Thought Process  Coherency WDL  Content WDL  Delusions None reported or observed  Perception Hallucinations  Hallucination Auditory  Judgment Impaired  Confusion None  Danger to Self  Current suicidal ideation? Denies  Agreement Not to Harm Self Yes  Description of Agreement verbal

## 2024-03-16 NOTE — Progress Notes (Signed)
 [facility] MD Progress Note  03/16/2024 10:43 AM Charles Charles  MRN:  [id number]  Sharmarke Cicio is a 63 year old male with a past medical history of HIV, CVA, hypothyroidism, COPD, GERD, MDD and alcohol and cocaine use who has been admitted from [facility] after attempting to walk onto oncoming traffic. He states that he hears voices telling him to hurt himself, kill himself, and sometimes to hurt anyone in his way.  He reports hearing male voices, command type.  He reports the voices been happening for a long time.   Subjective:  Chart reviewed, case discussed in multidisciplinary meeting, patient seen during rounds. Today he has no complaints, denies SI plan. He continues to have auditory hallucinations telling him to harm himself despite increasing the Risperdal  last night. Discussed that we will increase to 3mg  BID and that if this still doesn't work then we will consider Haldol , but he states it makes him shaky. Discussed can then either consider prolixin or ECT. He states the voices are louder when he is around people and often has to resort to his room for the voices to go down. He states he's always heard voices and cannot recall any medications in the past that have helped. Denies anxiety and panic attacks today and will continue to attend group sessions.    Sleep: Fair  Appetite:  Good  Past Psychiatric History: see H&P Family History:  Family History  Problem Relation Age of Onset   Anxiety disorder Mother    Diabetes Mother    Diabetes Father    Social History:  Social History   Substance and Sexual Activity  Alcohol Use Yes   Alcohol/week: 56.0 standard drinks of alcohol   Types: 56 Cans of beer per week   Comment: 1 beer 2 xs a week - last drink 4-5 days ago     Social History   Substance and Sexual Activity  Drug Use Yes   Types: Cocaine, Marijuana   Comment: last use 3-4 days ago    Social History   Socioeconomic History   Marital status: Single    Spouse name: Not on file    Number of children: Not on file   Years of education: Not on file   Highest education level: 8th grade  Occupational History   Not on file  Tobacco Use   Smoking status: Every Day    Current packs/day: 1.00    Average packs/day: 1 pack/day for 0.7 years (0.7 ttl pk-yrs)    Types: Cigarettes    Start date: 2025   Smokeless tobacco: Never   Tobacco comments:    (1-2 cigs/day)  Vaping Use   Vaping status: Never Used  Substance and Sexual Activity   Alcohol use: Yes    Alcohol/week: 56.0 standard drinks of alcohol    Types: 56 Cans of beer per week    Comment: 1 beer 2 xs a week - last drink 4-5 days ago   Drug use: Yes    Types: Cocaine, Marijuana    Comment: last use 3-4 days ago   Sexual activity: Not Currently    Partners: Female  Other Topics Concern   Not on file  Social History Narrative   Not on file   Social Drivers of Health   Financial Resource Strain: High Risk (11/06/2022)   Received from [facility]   Overall Financial Resource Strain (CARDIA)    Difficulty of Paying Living Expenses: Hard  Food Insecurity: No Food Insecurity (03/11/2024)  Hunger Vital Sign    Worried About Running Out of Food in the Last Year: Never true    Ran Out of Food in the Last Year: Never true  Transportation Needs: Unmet Transportation Needs (03/11/2024)   PRAPARE - Administrator, Civil Service (Medical): Yes    Lack of Transportation (Non-Medical): Yes  Physical Activity: Not on file  Stress: Not on file  Social Connections: Not on file   Past Medical History:  Past Medical History:  Diagnosis Date   Alcohol abuse 01/18/2021   Alcohol use disorder, moderate, dependence (HCC) 04/10/2016   Cannabis use disorder, moderate, dependence (HCC) 05/06/2016   COPD (chronic obstructive pulmonary disease) (HCC) 12/03/2022   COPD exacerbation (HCC) 12/03/2022   Drug overdose 01/05/2022   GERD (gastroesophageal reflux disease)    Headache    History of syphilis  12/03/2022   Neutropenia (HCC) 12/03/2022   Schizo-affective schizophrenia (HCC)    Suicidal ideation 04/09/2016    Past Surgical History:  Procedure Laterality Date   COLONOSCOPY WITH PROPOFOL  N/A 08/04/2015   Procedure: COLONOSCOPY WITH PROPOFOL ;  Surgeon: Rogelia Copping, MD;  Location: [facility];  Service: Endoscopy;  Laterality: N/A;   INCISION AND DRAINAGE PERIRECTAL ABSCESS N/A 07/14/2015   Procedure: IRRIGATION AND DEBRIDEMENT PERIRECTAL ABSCESS;  Surgeon: Charlie FORBES Fell, MD;  Location: [facility];  Service: General;  Laterality: N/A;   INCISION AND DRAINAGE PERIRECTAL ABSCESS N/A 08/14/2015   Procedure: IRRIGATION AND DEBRIDEMENT PERIRECTAL ABSCESS;  Surgeon: Dorothyann LITTIE Husk, MD;  Location: [facility];  Service: General;  Laterality: N/A;    Current Medications: Current Facility-Administered Medications  Medication Dose Route Frequency Provider Last Rate Last Admin   acetaminophen  (TYLENOL ) tablet 650 mg  650 mg Oral Q6H PRN Brent, Amanda C, NP   650 mg at 03/12/24 1400   albuterol  (VENTOLIN  HFA) 108 (90 Base) MCG/ACT inhaler 2 puff  2 puff Inhalation Q6H PRN Brent, Amanda C, NP       alum & mag hydroxide-simeth (MAALOX/MYLANTA) 200-200-20 MG/5ML suspension 30 mL  30 mL Oral Q4H PRN Brent, Amanda C, NP       diclofenac  Sodium (VOLTAREN ) 1 % topical gel 2 g  2 g Topical QID Rejina Odle, MD   2 g at 03/16/24 9075   doravirin-lamivudin-tenofov df (DELSTRIGO ) 100-300-300 MG per tablet 1 tablet  1 tablet Oral Daily Kenna Seward, MD   1 tablet at 03/16/24 9073   hydrOXYzine  (ATARAX ) tablet 25 mg  25 mg Oral TID PRN Brent, Amanda C, NP       magnesium  hydroxide (MILK OF MAGNESIA) suspension 30 mL  30 mL Oral Daily PRN Brent, Amanda C, NP       OLANZapine  (ZYPREXA ) injection 5 mg  5 mg Intramuscular TID PRN Brent, Amanda C, NP       OLANZapine  zydis (ZYPREXA ) disintegrating tablet 5 mg  5 mg Oral TID PRN Brent, Amanda C, NP       pantoprazole  (PROTONIX ) EC tablet 20 mg  20 mg  Oral Daily Brent, Amanda C, NP   20 mg at 03/16/24 9276   risperiDONE  (RISPERDAL ) tablet 1 mg  1 mg Oral q morning Rovena Hearld, MD   1 mg at 03/16/24 9074   risperiDONE  (RISPERDAL ) tablet 2 mg  2 mg Oral QHS Janett Kamath, MD   2 mg at 03/15/24 2144   sertraline  (ZOLOFT ) tablet 100 mg  100 mg Oral Daily Chrysta Fulcher, MD   100 mg at 03/16/24 [phone]  Lab Results:  No results found for this or any previous visit (from the past 48 hours).   Blood Alcohol level:  Lab Results  Component Value Date   ETH <15 03/11/2024   ETH 185 (H) 11/10/2023    Metabolic Disorder Labs: Lab Results  Component Value Date   HGBA1C 5.7 (H) 03/11/2024   MPG 116.89 03/11/2024   MPG 137 12/02/2022   Lab Results  Component Value Date   PROLACTIN 61.5 (H) 06/05/2016   PROLACTIN 18.8 (H) 06/04/2016   Lab Results  Component Value Date   CHOL 166 03/11/2024   TRIG 102 03/11/2024   HDL 48 03/11/2024   CHOLHDL 3.5 03/11/2024   VLDL 20 03/11/2024   LDLCALC 98 03/11/2024   LDLCALC 91 02/24/2024    Physical Findings: AIMS:  , ,  ,  ,    CIWA:    COWS:      Psychiatric Specialty Exam:  Presentation  General Appearance:  Appropriate for Environment; Casual  Eye Contact: Fleeting  Speech: Clear and Coherent  Speech Volume: Decreased    Mood and Affect  Mood: Depressed; Dysphoric  Affect: Depressed; Flat   Thought Process  Thought Processes: Coherent  Descriptions of Associations:Intact  Orientation:Full (Time, Place and Person)  Thought Content:WDL  Hallucinations: Denies Ideas of Reference:None  Suicidal Thoughts: Denies Homicidal Thoughts: Denies  Sensorium  Memory: Recent Fair; Immediate Good  Judgment: Fair  Insight: Fair   Chartered certified accountant: Fair  Attention Span: Fair  Recall: [employer] of Knowledge: Fair  Language: Fair   Psychomotor Activity  Psychomotor Activity:No data recorded Musculoskeletal: Strength &  Muscle Tone: within normal limits Gait & Station: normal Assets  Assets: Manufacturing systems engineer; Desire for Improvement; Financial Resources/Insurance; Resilience; Leisure Time    Physical Exam: Physical Exam Vitals and nursing note reviewed.  Constitutional:      Appearance: Normal appearance. He is normal weight.  HENT:     Head: Normocephalic.     Nose: Nose normal.     Mouth/Throat:     Mouth: Mucous membranes are moist.  Eyes:     Conjunctiva/sclera: Conjunctivae normal.  Pulmonary:     Effort: Pulmonary effort is normal.  Musculoskeletal:        General: Normal range of motion.     Cervical back: Normal range of motion.  Neurological:     Mental Status: He is alert and oriented to person, place, and time.  Psychiatric:        Mood and Affect: Mood normal.    Review of Systems  Constitutional:  Negative for chills, fever, malaise/fatigue and weight loss.  Musculoskeletal:  Positive for joint pain.  Psychiatric/Behavioral:  Positive for hallucinations. Negative for suicidal ideas.    Blood pressure 106/63, pulse 76, temperature 97.9 F (36.6 C), resp. rate 18, height 5' 6 (1.676 m), weight 73 kg, SpO2 100%. Body mass index is 25.98 kg/m.  Diagnosis: Principal Problem:   Schizoaffective disorder (HCC) Active Problems:   Substance-induced psychotic disorder with hallucinations (HCC)   Clinical Decision Making: Patient currently admitted to inpatient unit for worsening hallucinations and depression in the context of multiple psychosocial stressors including being homeless, substance use, relapse on crack cocaine.  Patient will be monitored closely for medication adjustments.   Treatment Plan Summary:   Safety and Monitoring:             -- Voluntary admission to inpatient psychiatric unit for safety, stabilization and treatment             --  Daily contact with patient to assess and evaluate symptoms and progress in treatment             -- Patient's case to be  discussed in multi-disciplinary team meeting             -- Observation Level: q15 minute checks             -- Vital signs:  q12 hours             -- Precautions: suicide, elopement, and assault   2. Psychiatric Diagnoses and Treatment:   --Continue Sertraline  100mg  daily -- Increase Risperidone  to 3mg  twice daily  3. Medical Issues Being Addressed: -- Left elbow Xray normal, continue Voltaren  gel prn pain.   4. Discharge Planning:   -- Social work and case management to assist with discharge planning and identification of hospital follow-up needs prior to discharge  -- Estimated LOS: 3-4 days  [employer], Student-PA 03/16/2024, 10:43 AM

## 2024-03-16 NOTE — Group Note (Signed)
 Date:  03/16/2024 Time:  10:15 PM  Group Topic/Focus:  Wrap-Up Group:   The focus of this group is to help patients review their daily goal of treatment and discuss progress on daily workbooks.    Participation Level:  Active  Participation Quality:  Appropriate  Affect:  Appropriate  Cognitive:  Alert  Insight: Appropriate  Engagement in Group:  Engaged  Modes of Intervention:  Discussion  Additional Comments:    Charles Charles 03/16/2024, 10:15 PM

## 2024-03-16 NOTE — Progress Notes (Signed)
   03/16/24 2115  Psych Admission Type (Psych Patients Only)  Admission Status Voluntary  Psychosocial Assessment  Patient Complaints None  Eye Contact Fair  Facial Expression Flat  Affect Flat  Speech Logical/coherent  Interaction Assertive  Motor Activity Slow  Appearance/Hygiene In scrubs  Behavior Characteristics Cooperative;Appropriate to situation  Mood Pleasant  Thought Process  Coherency WDL  Content WDL  Delusions None reported or observed  Perception Hallucinations  Hallucination None reported or observed  Judgment Impaired  Confusion None  Danger to Self  Current suicidal ideation? Denies  Agreement Not to Harm Self Yes  Description of Agreement verbal  Danger to Others  Danger to Others None reported or observed   Progress note   D: Pt seen in dayroom. Pt denies SI, HI, AVH. Pt rates pain  0/10. Pt rates anxiety  0/10 and depression  0/10. States he has had a good day today. No other concerns noted at this time.  A: Pt provided support and encouragement. Pt given scheduled medication as prescribed. PRNs as appropriate. Q15 min checks for safety.   R: Pt safe on the unit. Will continue to monitor.

## 2024-03-16 NOTE — Group Note (Signed)
 Recreation Therapy Group Note   Group Topic:General Recreation  Group Date: 03/16/2024 Start Time: 1100 End Time: 1130 Facilitators: Celestia Jeoffrey BRAVO, LRT, CTRS Location: Courtyard  Group Description: Outdoor Recreation. Patients had the option to play corn hole, ring toss, UNO, or listening to music while outside in the courtyard getting fresh air and sunlight. Patients helped water  and prune the raised garden beds. LRT and patients discussed things that they enjoy doing in their free time outside of the hospital. LRT encouraged patients to drink water  after being active and getting their heart rate up.   Goal Area(s) Addressed: Patient will identify leisure interests.  Patient will practice healthy decision making. Patient will engage in recreation activity.   Affect/Mood: Appropriate   Participation Level: Active   Participation Quality: Independent   Behavior: Appropriate   Speech/Thought Process: Coherent   Insight: Good   Judgement: Good   Modes of Intervention: Activity   Patient Response to Interventions:  Receptive   Education Outcome:  Acknowledges education   Clinical Observations/Individualized Feedback: Charles Charles was active in their participation of session activities and group discussion. Pt interacted well with LRT and peers duration of session.    Plan: Continue to engage patient in RT group sessions 2-3x/week.   Jeoffrey BRAVO Celestia, LRT, CTRS 03/16/2024 1:42 PM

## 2024-03-16 NOTE — Group Note (Signed)
 Date:  03/16/2024 Time:  10:25 AM  Group Topic/Focus:  Self Esteem Action Plan:   The focus of this group is to help patients create a plan to continue to build self-esteem after discharge.    Participation Level:  Active  Participation Quality:  Appropriate  Affect:  Appropriate  Cognitive:  Appropriate  Insight: Good  Engagement in Group:  Engaged  Modes of Intervention:  Activity  Additional Comments:  N/A  Harlene LITTIE Gavel 03/16/2024, 10:25 AM

## 2024-03-16 NOTE — Group Note (Signed)
 Recreation Therapy Group Note   Group Topic:Leisure Education  Group Date: 03/16/2024 Start Time: 1500 End Time: 1600 Facilitators: Celestia Jeoffrey BRAVO, LRT, CTRS Location: Dayroom  Group Description: Bingo. Patients played multiple rounds of bingo. LRT and pts discussed the definition of leisure, things they do in their free time outside of the hospital, and how bingo is also a leisure activity. Pts received soda as their bingo prize.   Goal Area(s) Addressed:  Patient will identify a current leisure interest.  Patient will learn the definition of "leisure". Patient will have the opportunity to try a new leisure activity. Patient will communicate with peers and LRT.   Affect/Mood: Appropriate   Participation Level: Active and Engaged   Participation Quality: Independent   Behavior: Appropriate, Calm, and Cooperative   Speech/Thought Process: Coherent   Insight: Good   Judgement: Good   Modes of Intervention: Cooperative Play   Patient Response to Interventions:  Attentive, Engaged, Interested , and Receptive   Education Outcome:  Acknowledges education   Clinical Observations/Individualized Feedback: Charles Charles was active in their participation of session activities and group discussion. Pt won a Facilities manager. Pt interacted well with LRT and peers duration of session.    Plan: Continue to engage patient in RT group sessions 2-3x/week.   Jeoffrey BRAVO Celestia, LRT, CTRS 03/16/2024 5:25 PM

## 2024-03-16 NOTE — Plan of Care (Signed)
   Problem: Physical Regulation: Goal: Ability to maintain clinical measurements within normal limits will improve Outcome: Progressing   Problem: Safety: Goal: Periods of time without injury will increase Outcome: Progressing

## 2024-03-17 DIAGNOSIS — F259 Schizoaffective disorder, unspecified: Secondary | ICD-10-CM | POA: Diagnosis not present

## 2024-03-17 DIAGNOSIS — F19951 Other psychoactive substance use, unspecified with psychoactive substance-induced psychotic disorder with hallucinations: Secondary | ICD-10-CM | POA: Diagnosis not present

## 2024-03-17 NOTE — Progress Notes (Signed)
 [facility] MD Progress Note  03/17/2024 1:49 PM Charles Charles  MRN:  [id number]  Charles Charles is a 63 year old male with a past medical history of HIV, CVA, hypothyroidism, COPD, GERD, MDD and alcohol and cocaine use who has been admitted from [facility] after attempting to walk onto oncoming traffic. He states that he hears voices telling him to hurt himself, kill himself, and sometimes to hurt anyone in his way.  He reports hearing male voices, command type.  He reports the voices been happening for a long time.   Subjective:  Chart reviewed, case discussed in multidisciplinary meeting, patient seen during rounds. Today he has no complaints, denies SI plan. He continues to have auditory hallucinations telling him to harm himself but notes they have felt much quieter today following the increased dose of Riserdal to 3mg  BID. Discussed that if this still doesn't work then we will consider Haldol , but he states it makes him shaky. Discussed can then either consider prolixin or ECT. He states the voices are louder when he is around people and often has to resort to his room for the voices to go down. He states he's always heard voices and cannot recall any medications in the past that have helped. Denies anxiety and panic attacks today and will continue to attend group sessions. He states that his arm is still sore but the voltaren  gel is helping.   Sleep: Fair  Appetite:  Good  Past Psychiatric History: see H&P Family History:  Family History  Problem Relation Age of Onset   Anxiety disorder Mother    Diabetes Mother    Diabetes Father    Social History:  Social History   Substance and Sexual Activity  Alcohol Use Yes   Alcohol/week: 56.0 standard drinks of alcohol   Types: 56 Cans of beer per week   Comment: 1 beer 2 xs a week - last drink 4-5 days ago     Social History   Substance and Sexual Activity  Drug Use Yes   Types: Cocaine, Marijuana   Comment: last use 3-4 days ago    Social  History   Socioeconomic History   Marital status: Single    Spouse name: Not on file   Number of children: Not on file   Years of education: Not on file   Highest education level: 8th grade  Occupational History   Not on file  Tobacco Use   Smoking status: Every Day    Current packs/day: 1.00    Average packs/day: 1 pack/day for 0.7 years (0.7 ttl pk-yrs)    Types: Cigarettes    Start date: 2025   Smokeless tobacco: Never   Tobacco comments:    (1-2 cigs/day)  Vaping Use   Vaping status: Never Used  Substance and Sexual Activity   Alcohol use: Yes    Alcohol/week: 56.0 standard drinks of alcohol    Types: 56 Cans of beer per week    Comment: 1 beer 2 xs a week - last drink 4-5 days ago   Drug use: Yes    Types: Cocaine, Marijuana    Comment: last use 3-4 days ago   Sexual activity: Not Currently    Partners: Female  Other Topics Concern   Not on file  Social History Narrative   Not on file   Social Drivers of Health   Financial Resource Strain: High Risk (11/06/2022)   Received from [facility]   Overall Financial Resource Strain (CARDIA)  Difficulty of Paying Living Expenses: Hard  Food Insecurity: No Food Insecurity (03/11/2024)   Hunger Vital Sign    Worried About Running Out of Food in the Last Year: Never true    Ran Out of Food in the Last Year: Never true  Transportation Needs: Unmet Transportation Needs (03/11/2024)   PRAPARE - Administrator, Civil Service (Medical): Yes    Lack of Transportation (Non-Medical): Yes  Physical Activity: Not on file  Stress: Not on file  Social Connections: Not on file   Past Medical History:  Past Medical History:  Diagnosis Date   Alcohol abuse 01/18/2021   Alcohol use disorder, moderate, dependence (HCC) 04/10/2016   Cannabis use disorder, moderate, dependence (HCC) 05/06/2016   COPD (chronic obstructive pulmonary disease) (HCC) 12/03/2022   COPD exacerbation (HCC) 12/03/2022   Drug overdose  01/05/2022   GERD (gastroesophageal reflux disease)    Headache    History of syphilis 12/03/2022   Neutropenia (HCC) 12/03/2022   Schizo-affective schizophrenia (HCC)    Suicidal ideation 04/09/2016    Past Surgical History:  Procedure Laterality Date   COLONOSCOPY WITH PROPOFOL  N/A 08/04/2015   Procedure: COLONOSCOPY WITH PROPOFOL ;  Surgeon: Rogelia Copping, MD;  Location: [facility];  Service: Endoscopy;  Laterality: N/A;   INCISION AND DRAINAGE PERIRECTAL ABSCESS N/A 07/14/2015   Procedure: IRRIGATION AND DEBRIDEMENT PERIRECTAL ABSCESS;  Surgeon: Charlie FORBES Fell, MD;  Location: [facility];  Service: General;  Laterality: N/A;   INCISION AND DRAINAGE PERIRECTAL ABSCESS N/A 08/14/2015   Procedure: IRRIGATION AND DEBRIDEMENT PERIRECTAL ABSCESS;  Surgeon: Dorothyann LITTIE Husk, MD;  Location: [facility];  Service: General;  Laterality: N/A;    Current Medications: Current Facility-Administered Medications  Medication Dose Route Frequency Provider Last Rate Last Admin   acetaminophen  (TYLENOL ) tablet 650 mg  650 mg Oral Q6H PRN Brent, Amanda C, NP   650 mg at 03/12/24 1400   albuterol  (VENTOLIN  HFA) 108 (90 Base) MCG/ACT inhaler 2 puff  2 puff Inhalation Q6H PRN Brent, Amanda C, NP       alum & mag hydroxide-simeth (MAALOX/MYLANTA) 200-200-20 MG/5ML suspension 30 mL  30 mL Oral Q4H PRN Brent, Amanda C, NP       diclofenac  Sodium (VOLTAREN ) 1 % topical gel 2 g  2 g Topical QID Yania Bogie, MD   2 g at 03/17/24 0933   diphenhydrAMINE  (BENADRYL ) capsule 25 mg  25 mg Oral Q6H PRN Everette Mall, MD   25 mg at 03/16/24 2121   doravirin-lamivudin-tenofov df (DELSTRIGO ) 100-300-300 MG per tablet 1 tablet  1 tablet Oral Daily Elon Lomeli, MD   1 tablet at 03/17/24 9074   hydrOXYzine  (ATARAX ) tablet 25 mg  25 mg Oral TID PRN Brent, Amanda C, NP       magnesium  hydroxide (MILK OF MAGNESIA) suspension 30 mL  30 mL Oral Daily PRN Brent, Amanda C, NP       OLANZapine  (ZYPREXA ) injection 5 mg  5 mg  Intramuscular TID PRN Brent, Amanda C, NP       OLANZapine  zydis (ZYPREXA ) disintegrating tablet 5 mg  5 mg Oral TID PRN Brent, Amanda C, NP       pantoprazole  (PROTONIX ) EC tablet 20 mg  20 mg Oral Daily Brent, Amanda C, NP   20 mg at 03/17/24 9257   risperiDONE  (RISPERDAL ) tablet 3 mg  3 mg Oral BID Alani Sabbagh, MD   3 mg at 03/17/24 0916   sertraline  (ZOLOFT ) tablet 100 mg  100 mg  Oral Daily Demetria Lightsey, MD   100 mg at 03/17/24 9081    Lab Results:  No results found for this or any previous visit (from the past 48 hours).   Blood Alcohol level:  Lab Results  Component Value Date   ETH <15 03/11/2024   ETH 185 (H) 11/10/2023    Metabolic Disorder Labs: Lab Results  Component Value Date   HGBA1C 5.7 (H) 03/11/2024   MPG 116.89 03/11/2024   MPG 137 12/02/2022   Lab Results  Component Value Date   PROLACTIN 61.5 (H) 06/05/2016   PROLACTIN 18.8 (H) 06/04/2016   Lab Results  Component Value Date   CHOL 166 03/11/2024   TRIG 102 03/11/2024   HDL 48 03/11/2024   CHOLHDL 3.5 03/11/2024   VLDL 20 03/11/2024   LDLCALC 98 03/11/2024   LDLCALC 91 02/24/2024    Physical Findings: AIMS:  , ,  ,  ,    CIWA:    COWS:      Psychiatric Specialty Exam:  Presentation  General Appearance:  Appropriate for Environment; Casual  Eye Contact: Fleeting  Speech: Clear and Coherent  Speech Volume: Decreased    Mood and Affect  Mood: Depressed; Dysphoric  Affect: Depressed; Flat   Thought Process  Thought Processes: Coherent  Descriptions of Associations:Intact  Orientation:Full (Time, Place and Person)  Thought Content:WDL  Hallucinations: Denies Ideas of Reference:None  Suicidal Thoughts: Denies Homicidal Thoughts: Denies  Sensorium  Memory: Recent Fair; Immediate Good  Judgment: Fair  Insight: Fair   Chartered certified accountant: Fair  Attention Span: Fair  Recall: [employer] of  Knowledge: Fair  Language: Fair   Psychomotor Activity  Psychomotor Activity:No data recorded Musculoskeletal: Strength & Muscle Tone: within normal limits Gait & Station: normal Assets  Assets: Manufacturing systems engineer; Desire for Improvement; Financial Resources/Insurance; Resilience; Leisure Time    Physical Exam: Physical Exam Vitals and nursing note reviewed.  Constitutional:      Appearance: Normal appearance. He is normal weight.  HENT:     Head: Normocephalic.     Nose: Nose normal.     Mouth/Throat:     Mouth: Mucous membranes are moist.  Eyes:     Conjunctiva/sclera: Conjunctivae normal.  Pulmonary:     Effort: Pulmonary effort is normal.  Musculoskeletal:        General: Normal range of motion.     Cervical back: Normal range of motion.  Neurological:     Mental Status: He is alert and oriented to person, place, and time.  Psychiatric:        Mood and Affect: Mood normal.    Review of Systems  Constitutional:  Negative for chills, fever, malaise/fatigue and weight loss.  Musculoskeletal:  Positive for joint pain.  Psychiatric/Behavioral:  Positive for hallucinations. Negative for suicidal ideas.    Blood pressure 120/76, pulse 74, temperature 98 F (36.7 C), resp. rate 14, height 5' 6 (1.676 m), weight 73 kg, SpO2 100%. Body mass index is 25.98 kg/m.  Diagnosis: Principal Problem:   Schizoaffective disorder (HCC) Active Problems:   Substance-induced psychotic disorder with hallucinations (HCC)   Clinical Decision Making: Patient currently admitted to inpatient unit for worsening hallucinations and depression in the context of multiple psychosocial stressors including being homeless, substance use, relapse on crack cocaine.  Patient will be monitored closely for medication adjustments.   Treatment Plan Summary:   Safety and Monitoring:             -- Voluntary admission to  inpatient psychiatric unit for safety, stabilization and treatment              -- Daily contact with patient to assess and evaluate symptoms and progress in treatment             -- Patient's case to be discussed in multi-disciplinary team meeting             -- Observation Level: q15 minute checks             -- Vital signs:  q12 hours             -- Precautions: suicide, elopement, and assault   2. Psychiatric Diagnoses and Treatment:   --Continue Sertraline  100mg  daily -- Risperidone  3mg  twice daily  3. Medical Issues Being Addressed: -- Left elbow Xray normal, continue Voltaren  gel prn pain.   4. Discharge Planning:   -- Social work and case management to assist with discharge planning and identification of hospital follow-up needs prior to discharge  -- Estimated LOS: 3-4 days  Lauraine Clause, Student-PA 03/17/2024, 1:49 PM

## 2024-03-17 NOTE — Plan of Care (Signed)
  Problem: Education: Goal: Will be free of psychotic symptoms Outcome: Progressing Goal: Knowledge of the prescribed therapeutic regimen will improve Outcome: Progressing   Problem: Coping: Goal: Coping ability will improve Outcome: Progressing Goal: Will verbalize feelings Outcome: Progressing   Problem: Health Behavior/Discharge Planning: Goal: Compliance with prescribed medication regimen will improve Outcome: Progressing   Problem: Nutritional: Goal: Ability to achieve adequate nutritional intake will improve Outcome: Progressing   Problem: Safety: Goal: Ability to redirect hostility and anger into socially appropriate behaviors will improve Outcome: Progressing Goal: Ability to remain free from injury will improve Outcome: Progressing   Problem: Self-Care: Goal: Ability to participate in self-care as condition permits will improve Outcome: Progressing   Problem: Education: Goal: Knowledge of Study [place] [facility] Education information/materials will improve Outcome: Progressing Goal: Emotional status will improve Outcome: Progressing Goal: Mental status will improve Outcome: Progressing Goal: Verbalization of understanding the information provided will improve Outcome: Progressing   Problem: Activity: Goal: Interest or engagement in activities will improve Outcome: Progressing Goal: Sleeping patterns will improve Outcome: Progressing   Problem: Coping: Goal: Ability to verbalize frustrations and anger appropriately will improve Outcome: Progressing Goal: Ability to demonstrate self-control will improve Outcome: Progressing   Problem: Health Behavior/Discharge Planning: Goal: Identification of resources available to assist in meeting health care needs will improve Outcome: Progressing Goal: Compliance with treatment plan for underlying cause of condition will improve Outcome: Progressing   Problem: Physical Regulation: Goal: Ability to maintain clinical  measurements within normal limits will improve Outcome: Progressing   Problem: Safety: Goal: Periods of time without injury will increase Outcome: Progressing   Problem: OP Suicidal Ideation Goal: LTG: Placement in appropriate level of care to safely address suicidal or parasuicidal crisis, based on clinical assessment Outcome: Progressing Goal: LTG: Akram will be free from thoughts of self-harm as evidenced by suicide assessment or self-reported score of ideations Outcome: Progressing Goal: STG: Educate Breckon on principles of suicide risk safety plan and levels of interventions Outcome: Progressing Goal: STG: Work with Sandra to create and implement personal suicide risk safety plan Outcome: Progressing

## 2024-03-17 NOTE — Progress Notes (Signed)
(  Sleep Hours) - 10.25 (Any PRNs that were needed, meds refused, or side effects to meds)- benadryl  25 mg for allergies (Any disturbances and when (visitation, over night)- none (Concerns raised by the patient)- none (SI/HI/AVH)- denies

## 2024-03-17 NOTE — BH IP Treatment Plan (Signed)
 Interdisciplinary Treatment and Diagnostic Plan Update  03/17/2024 Time of Session: 2:30PM Charles Charles MRN: [id number]  Principal Diagnosis: Schizoaffective disorder [facility])  Secondary Diagnoses: Principal Problem:   Schizoaffective disorder (HCC) Active Problems:   Substance-induced psychotic disorder with hallucinations (HCC)   Current Medications:  Current Facility-Administered Medications  Medication Dose Route Frequency Provider Last Rate Last Admin   acetaminophen  (TYLENOL ) tablet 650 mg  650 mg Oral Q6H PRN Brent, Amanda C, NP   650 mg at 03/12/24 1400   albuterol  (VENTOLIN  HFA) 108 (90 Base) MCG/ACT inhaler 2 puff  2 puff Inhalation Q6H PRN Brent, Amanda C, NP       alum & mag hydroxide-simeth (MAALOX/MYLANTA) 200-200-20 MG/5ML suspension 30 mL  30 mL Oral Q4H PRN Brent, Amanda C, NP       diclofenac  Sodium (VOLTAREN ) 1 % topical gel 2 g  2 g Topical QID Jadapalle, Sree, MD   2 g at 03/17/24 9066   diphenhydrAMINE  (BENADRYL ) capsule 25 mg  25 mg Oral Q6H PRN Jadapalle, Sree, MD   25 mg at 03/16/24 2121   doravirin-lamivudin-tenofov df (DELSTRIGO ) 100-300-300 MG per tablet 1 tablet  1 tablet Oral Daily Jadapalle, Sree, MD   1 tablet at 03/17/24 9074   hydrOXYzine  (ATARAX ) tablet 25 mg  25 mg Oral TID PRN Brent, Amanda C, NP       magnesium  hydroxide (MILK OF MAGNESIA) suspension 30 mL  30 mL Oral Daily PRN Brent, Amanda C, NP       OLANZapine  (ZYPREXA ) injection 5 mg  5 mg Intramuscular TID PRN Brent, Amanda C, NP       OLANZapine  zydis (ZYPREXA ) disintegrating tablet 5 mg  5 mg Oral TID PRN Brent, Amanda C, NP       pantoprazole  (PROTONIX ) EC tablet 20 mg  20 mg Oral Daily Brent, Amanda C, NP   20 mg at 03/17/24 9257   risperiDONE  (RISPERDAL ) tablet 3 mg  3 mg Oral BID Jadapalle, Sree, MD   3 mg at 03/17/24 9083   sertraline  (ZOLOFT ) tablet 100 mg  100 mg Oral Daily Jadapalle, Sree, MD   100 mg at 03/17/24 9081   PTA Medications: Medications Prior to Admission  Medication Sig  Dispense Refill Last Dose/Taking   albuterol  (VENTOLIN  HFA) 108 (90 Base) MCG/ACT inhaler Inhale 2 puffs into the lungs every 6 (six) hours as needed for wheezing or shortness of breath.      doravirin-lamivudin-tenofov df (DELSTRIGO ) 100-300-300 MG TABS per tablet Take 1 tablet by mouth daily. 30 tablet 3    hydrOXYzine  (ATARAX ) 25 MG tablet Take 1 tablet (25 mg total) by mouth 3 (three) times daily as needed for anxiety. (Patient taking differently: Take 25 mg by mouth 3 (three) times daily.) 30 tablet 0    OLANZapine  (ZYPREXA ) 15 MG tablet Take 1 tablet (15 mg total) by mouth at bedtime. 30 tablet 0    pantoprazole  (PROTONIX ) 20 MG tablet Take 1 tablet (20 mg total) by mouth daily. 30 tablet 0    sertraline  (ZOLOFT ) 50 MG tablet Take 1 tablet (50 mg total) by mouth daily. 30 tablet 0     Patient Stressors: Financial difficulties   Loss of close relationship with his mother and children.   Medication change or noncompliance   Substance abuse    Patient Strengths: Ability for insight  Communication skills   Treatment Modalities: Medication Management, Group therapy, Case management,  1 to 1 session with clinician, Psychoeducation, Recreational therapy.   Physician Treatment Plan  for Primary Diagnosis: Schizoaffective disorder (HCC) Long Term Goal(s): Improvement in symptoms so as ready for discharge   Short Term Goals: Ability to identify changes in lifestyle to reduce recurrence of condition will improve Ability to verbalize feelings will improve Ability to disclose and discuss suicidal ideas Ability to demonstrate self-control will improve Ability to identify and develop effective coping behaviors will improve Ability to maintain clinical measurements within normal limits will improve Compliance with prescribed medications will improve Ability to identify triggers associated with substance abuse/mental health issues will improve  Medication Management: Evaluate patient's response,  side effects, and tolerance of medication regimen.  Therapeutic Interventions: 1 to 1 sessions, Unit Group sessions and Medication administration.  Evaluation of Outcomes: Progressing  Physician Treatment Plan for Secondary Diagnosis: Principal Problem:   Schizoaffective disorder (HCC) Active Problems:   Substance-induced psychotic disorder with hallucinations (HCC)  Long Term Goal(s): Improvement in symptoms so as ready for discharge   Short Term Goals: Ability to identify changes in lifestyle to reduce recurrence of condition will improve Ability to verbalize feelings will improve Ability to disclose and discuss suicidal ideas Ability to demonstrate self-control will improve Ability to identify and develop effective coping behaviors will improve Ability to maintain clinical measurements within normal limits will improve Compliance with prescribed medications will improve Ability to identify triggers associated with substance abuse/mental health issues will improve     Medication Management: Evaluate patient's response, side effects, and tolerance of medication regimen.  Therapeutic Interventions: 1 to 1 sessions, Unit Group sessions and Medication administration.  Evaluation of Outcomes: Progressing   RN Treatment Plan for Primary Diagnosis: Schizoaffective disorder (HCC) Long Term Goal(s): Knowledge of disease and therapeutic regimen to maintain health will improve  Short Term Goals: Ability to demonstrate self-control, Ability to participate in decision making will improve, Ability to verbalize feelings will improve, Ability to disclose and discuss suicidal ideas, Ability to identify and develop effective coping behaviors will improve, and Compliance with prescribed medications will improve  Medication Management: RN will administer medications as ordered by provider, will assess and evaluate patient's response and provide education to patient for prescribed medication. RN will  report any adverse and/or side effects to prescribing provider.  Therapeutic Interventions: 1 on 1 counseling sessions, Psychoeducation, Medication administration, Evaluate responses to treatment, Monitor vital signs and CBGs as ordered, Perform/monitor CIWA, COWS, AIMS and Fall Risk screenings as ordered, Perform wound care treatments as ordered.  Evaluation of Outcomes: Progressing   LCSW Treatment Plan for Primary Diagnosis: Schizoaffective disorder (HCC) Long Term Goal(s): Safe transition to appropriate next level of care at discharge, Engage patient in therapeutic group addressing interpersonal concerns.  Short Term Goals: Engage patient in aftercare planning with referrals and resources, Increase social support, Increase ability to appropriately verbalize feelings, Increase emotional regulation, Facilitate acceptance of mental health diagnosis and concerns, Facilitate patient progression through stages of change regarding substance use diagnoses and concerns, Identify triggers associated with mental health/substance abuse issues, and Increase skills for wellness and recovery  Therapeutic Interventions: Assess for all discharge needs, 1 to 1 time with Social worker, Explore available resources and support systems, Assess for adequacy in community support network, Educate family and significant other(s) on suicide prevention, Complete Psychosocial Assessment, Interpersonal group therapy.  Evaluation of Outcomes: Progressing   Progress in Treatment: Attending groups: Yes. Participating in groups: Yes. Taking medication as prescribed: Yes. Toleration medication: Yes. Family/Significant other contact made: No, will contact:  once permission has been granted Patient understands diagnosis: Yes. Discussing patient identified  problems/goals with staff: Yes. Medical problems stabilized or resolved: Yes. Denies suicidal/homicidal ideation: Yes. Issues/concerns per patient self-inventory:  No. Other: none  New problem(s) identified: No, Describe:  None identified  Update 03/17/2024: No changes at this time.    New Short Term/Long Term Goal(s): elimination of symptoms of psychosis, medication management for mood stabilization; elimination of SI thoughts; development of comprehensive mental wellness plan.  Update 03/17/2024: No changes at this time.    Patient Goals:  Just trying to stop hearing voices  Update 03/17/2024: No changes at this time.    Discharge Plan or Barriers: CSW will assist with appropriate discharge planning Update 03/17/2024: Patient reports plans to return to his home with is friend.  He reports no barriers at this time.    Reason for Continuation of Hospitalization: Hallucinations Medication stabilization   Estimated Length of Stay: 1 to 7 days  Update 03/17/2024: TBD.     Last 3 [place] Suicide Severity Risk Score: Flowsheet Row Admission (Current) from 03/11/2024 in [facility] BEHAVIORAL MEDICINE Most recent reading at 03/11/2024 10:10 PM ED from 03/11/2024 in [facility] Most recent reading at 03/11/2024  3:32 PM Admission (Discharged) from 11/11/2023 in [facility] INPATIENT BEHAVIORAL MEDICINE Most recent reading at 11/11/2023  2:00 AM  C-SSRS RISK CATEGORY Moderate Risk Low Risk Low Risk    Last PHQ 2/9 Scores:     No data to display          Scribe for Treatment Team: Sherryle JINNY Margo, KEN 03/17/2024 4:44 PM

## 2024-03-17 NOTE — Plan of Care (Signed)
  Problem: Education: Goal: Will be free of psychotic symptoms Outcome: Progressing Goal: Knowledge of the prescribed therapeutic regimen will improve Outcome: Progressing   Problem: Coping: Goal: Coping ability will improve Outcome: Progressing Goal: Will verbalize feelings Outcome: Progressing   Problem: Health Behavior/Discharge Planning: Goal: Compliance with prescribed medication regimen will improve Outcome: Progressing   Problem: Safety: Goal: Ability to remain free from injury will improve Outcome: Progressing   Problem: Education: Goal: Emotional status will improve Outcome: Progressing   Problem: Activity: Goal: Sleeping patterns will improve Outcome: Progressing

## 2024-03-17 NOTE — Progress Notes (Signed)
   03/17/24 2100  Psych Admission Type (Psych Patients Only)  Admission Status Voluntary  Psychosocial Assessment  Patient Complaints None  Eye Contact Fair  Facial Expression Flat  Affect Flat  Speech Logical/coherent  Interaction Assertive  Motor Activity Slow  Appearance/Hygiene In scrubs  Behavior Characteristics Cooperative;Appropriate to situation  Mood Pleasant  Thought Process  Coherency WDL  Content WDL  Delusions None reported or observed  Perception WDL  Hallucination None reported or observed  Judgment Impaired  Confusion None  Danger to Self  Current suicidal ideation? Denies  Self-Injurious Behavior No self-injurious ideation or behavior indicators observed or expressed   Agreement Not to Harm Self Yes  Description of Agreement verbal  Danger to Others  Danger to Others None reported or observed

## 2024-03-17 NOTE — Group Note (Signed)
 LCSW Group Therapy Note  Group Date: 03/17/2024 Start Time: 1305 End Time: 1345   Type of Therapy and Topic:  Group Therapy: Anger Cues and Responses  Participation Level:  Active   Description of Group:   In this group, patients learned how to recognize the physical, cognitive, emotional, and behavioral responses they have to anger-provoking situations.  They identified a recent time they became angry and how they reacted.  They analyzed how their reaction was possibly beneficial and how it was possibly unhelpful.  The group discussed a variety of healthier coping skills that could help with such a situation in the future.  Focus was placed on how helpful it is to recognize the underlying emotions to our anger, because working on those can lead to a more permanent solution as well as our ability to focus on the important rather than the urgent.  Therapeutic Goals: Patients will remember their last incident of anger and how they felt emotionally and physically, what their thoughts were at the time, and how they behaved. Patients will identify how their behavior at that time worked for them, as well as how it worked against them. Patients will explore possible new behaviors to use in future anger situations. Patients will learn that anger itself is normal and cannot be eliminated, and that healthier reactions can assist with resolving conflict rather than worsening situations.  Summary of Patient Progress:   Patient was active during the group. He demonstrated fair insight into the subject matter, was respectful of peers, and participated throughout the entire session.  Therapeutic Modalities:   Cognitive Behavioral Therapy    Sherryle JINNY Margo, LCSW 03/17/2024  1:44 PM

## 2024-03-17 NOTE — Progress Notes (Signed)
   03/17/24 1500  Psych Admission Type (Psych Patients Only)  Admission Status Voluntary  Psychosocial Assessment  Patient Complaints None  Eye Contact Fair  Facial Expression Flat  Affect Flat  Speech Logical/coherent  Interaction Assertive  Motor Activity Slow  Appearance/Hygiene In scrubs  Behavior Characteristics Cooperative  Mood Pleasant  Thought Process  Coherency WDL  Content WDL  Delusions None reported or observed  Perception WDL  Hallucination None reported or observed  Judgment Impaired  Confusion None  Danger to Self  Current suicidal ideation? Denies  Agreement Not to Harm Self Yes  Description of Agreement verbal  Danger to Others  Danger to Others None reported or observed

## 2024-03-17 NOTE — Group Note (Signed)
 Date:  03/17/2024 Time:  11:06 AM  Group Topic/Focus:  Movement therapy    Participation Level:  Active  Participation Quality:  Appropriate  Affect:  Appropriate  Cognitive:  Appropriate  Insight: Appropriate  Engagement in Group:  Engaged  Modes of Intervention:  Activity  Additional Comments:  None  Charles Charles [name] 03/17/2024, 11:06 AM

## 2024-03-18 DIAGNOSIS — F19951 Other psychoactive substance use, unspecified with psychoactive substance-induced psychotic disorder with hallucinations: Secondary | ICD-10-CM | POA: Diagnosis not present

## 2024-03-18 DIAGNOSIS — F259 Schizoaffective disorder, unspecified: Secondary | ICD-10-CM | POA: Diagnosis not present

## 2024-03-18 NOTE — Progress Notes (Signed)
 Patient is a voluntary admission to [facility] with plans to discharge on Sunday. Patient is pleasant and cooperative with both peers and staff. No internal stimulation. Denies SI, HI, AVH, anxiety and depression.  Participates in meals and group activities and enjoys relaxing in the day room.  Will continue to monitor.

## 2024-03-18 NOTE — Group Note (Signed)
 Recreation Therapy Group Note   Group Topic:Animal Assisted Therapy   Group Date: 03/18/2024 Start Time: 1035 End Time: 1100 Facilitators: Celestia Jeoffrey BRAVO, LRT, CTRS Location: Courtyard  Group Description: AAA. Animal-Assisted Activity provides opportunities for motivational, educational, therapeutic and/or recreational benefits to enhance quality of life. [name] and [name] visited the unit to interact with patients.   Goal Areas Addressed:  Reduced anxiety and stress Improved mood Increased social interaction Enhanced communication skills Reduced loneliness and isolation Improved emotional regulation  Affect/Mood: Appropriate   Participation Level: Minimal    Clinical Observations/Individualized Feedback: Charles Charles was present group. Pt shared that he was afraid of big dogs and did not want to interact.   Plan: Continue to engage patient in RT group sessions 2-3x/week.   Jeoffrey BRAVO Celestia, LRT, CTRS 03/18/2024 1:32 PM

## 2024-03-18 NOTE — Progress Notes (Signed)
 [facility] MD Progress Note  03/18/2024 12:30 PM Charles Charles  MRN:  [id number]  Charles Charles is a 63 year old male with a past medical history of HIV, CVA, hypothyroidism, COPD, GERD, MDD and alcohol and cocaine use who has been admitted from [facility] after attempting to walk onto oncoming traffic. He states that he hears voices telling him to hurt himself, kill himself, and sometimes to hurt anyone in his way.  He reports hearing male voices, command type.  He reports the voices been happening for a long time.   Subjective:  Chart reviewed, case discussed in multidisciplinary meeting, patient seen during rounds.  Patient is noted to be resting in the day area.  Patient offers no complaints.  He reports that he is still feels a little tired on morning dose of Risperdal  but wants to continue as the voices have gone down with Risperdal  3 mg twice daily.  Patient denies SI/HI/plan.  Patient has fair appetite and sleep.  Sleep: Fair  Appetite:  Good  Past Psychiatric History: see H&P Family History:  Family History  Problem Relation Age of Onset   Anxiety disorder Mother    Diabetes Mother    Diabetes Father    Social History:  Social History   Substance and Sexual Activity  Alcohol Use Yes   Alcohol/week: 56.0 standard drinks of alcohol   Types: 56 Cans of beer per week   Comment: 1 beer 2 xs a week - last drink 4-5 days ago     Social History   Substance and Sexual Activity  Drug Use Yes   Types: Cocaine, Marijuana   Comment: last use 3-4 days ago    Social History   Socioeconomic History   Marital status: Single    Spouse name: Not on file   Number of children: Not on file   Years of education: Not on file   Highest education level: 8th grade  Occupational History   Not on file  Tobacco Use   Smoking status: Every Day    Current packs/day: 1.00    Average packs/day: 1 pack/day for 0.7 years (0.7 ttl pk-yrs)    Types: Cigarettes    Start date: 2025   Smokeless tobacco: Never    Tobacco comments:    (1-2 cigs/day)  Vaping Use   Vaping status: Never Used  Substance and Sexual Activity   Alcohol use: Yes    Alcohol/week: 56.0 standard drinks of alcohol    Types: 56 Cans of beer per week    Comment: 1 beer 2 xs a week - last drink 4-5 days ago   Drug use: Yes    Types: Cocaine, Marijuana    Comment: last use 3-4 days ago   Sexual activity: Not Currently    Partners: Female  Other Topics Concern   Not on file  Social History Narrative   Not on file   Social Drivers of Health   Financial Resource Strain: High Risk (11/06/2022)   Received from [facility]   Overall Financial Resource Strain (CARDIA)    Difficulty of Paying Living Expenses: Hard  Food Insecurity: No Food Insecurity (03/11/2024)   Hunger Vital Sign    Worried About Running Out of Food in the Last Year: Never true    Ran Out of Food in the Last Year: Never true  Transportation Needs: Unmet Transportation Needs (03/11/2024)   PRAPARE - Administrator, Civil Service (Medical): Yes    Lack of Transportation (Non-Medical):  Yes  Physical Activity: Not on file  Stress: Not on file  Social Connections: Not on file   Past Medical History:  Past Medical History:  Diagnosis Date   Alcohol abuse 01/18/2021   Alcohol use disorder, moderate, dependence (HCC) 04/10/2016   Cannabis use disorder, moderate, dependence (HCC) 05/06/2016   COPD (chronic obstructive pulmonary disease) (HCC) 12/03/2022   COPD exacerbation (HCC) 12/03/2022   Drug overdose 01/05/2022   GERD (gastroesophageal reflux disease)    Headache    History of syphilis 12/03/2022   Neutropenia (HCC) 12/03/2022   Schizo-affective schizophrenia (HCC)    Suicidal ideation 04/09/2016    Past Surgical History:  Procedure Laterality Date   COLONOSCOPY WITH PROPOFOL  N/A 08/04/2015   Procedure: COLONOSCOPY WITH PROPOFOL ;  Surgeon: Rogelia Copping, MD;  Location: [facility];  Service: Endoscopy;  Laterality: N/A;    INCISION AND DRAINAGE PERIRECTAL ABSCESS N/A 07/14/2015   Procedure: IRRIGATION AND DEBRIDEMENT PERIRECTAL ABSCESS;  Surgeon: Charlie FORBES Fell, MD;  Location: [facility];  Service: General;  Laterality: N/A;   INCISION AND DRAINAGE PERIRECTAL ABSCESS N/A 08/14/2015   Procedure: IRRIGATION AND DEBRIDEMENT PERIRECTAL ABSCESS;  Surgeon: Dorothyann LITTIE Husk, MD;  Location: [facility];  Service: General;  Laterality: N/A;    Current Medications: Current Facility-Administered Medications  Medication Dose Route Frequency Provider Last Rate Last Admin   acetaminophen  (TYLENOL ) tablet 650 mg  650 mg Oral Q6H PRN Brent, Amanda C, NP   650 mg at 03/12/24 1400   albuterol  (VENTOLIN  HFA) 108 (90 Base) MCG/ACT inhaler 2 puff  2 puff Inhalation Q6H PRN Brent, Amanda C, NP       alum & mag hydroxide-simeth (MAALOX/MYLANTA) 200-200-20 MG/5ML suspension 30 mL  30 mL Oral Q4H PRN Brent, Amanda C, NP       diclofenac  Sodium (VOLTAREN ) 1 % topical gel 2 g  2 g Topical QID Donnelly Mellow, MD   2 g at 03/18/24 9072   diphenhydrAMINE  (BENADRYL ) capsule 25 mg  25 mg Oral Q6H PRN Nocholas Damaso, MD   25 mg at 03/17/24 1929   doravirin-lamivudin-tenofov df (DELSTRIGO ) 100-300-300 MG per tablet 1 tablet  1 tablet Oral Daily Latina Frank, MD   1 tablet at 03/18/24 0935   hydrOXYzine  (ATARAX ) tablet 25 mg  25 mg Oral TID PRN Brent, Amanda C, NP       magnesium  hydroxide (MILK OF MAGNESIA) suspension 30 mL  30 mL Oral Daily PRN Brent, Amanda C, NP       OLANZapine  (ZYPREXA ) injection 5 mg  5 mg Intramuscular TID PRN Brent, Amanda C, NP       OLANZapine  zydis (ZYPREXA ) disintegrating tablet 5 mg  5 mg Oral TID PRN Brent, Amanda C, NP       pantoprazole  (PROTONIX ) EC tablet 20 mg  20 mg Oral Daily Brent, Amanda C, NP   20 mg at 03/18/24 0741   risperiDONE  (RISPERDAL ) tablet 3 mg  3 mg Oral BID Gary Gabrielsen, MD   3 mg at 03/18/24 9074   sertraline  (ZOLOFT ) tablet 100 mg  100 mg Oral Daily Windie Marasco, MD   100 mg at  03/18/24 9074    Lab Results:  No results found for this or any previous visit (from the past 48 hours).   Blood Alcohol level:  Lab Results  Component Value Date   [facility] <15 03/11/2024   ETH 185 (H) 11/10/2023    Metabolic Disorder Labs: Lab Results  Component Value Date   HGBA1C 5.7 (H) 03/11/2024  MPG 116.89 03/11/2024   MPG 137 12/02/2022   Lab Results  Component Value Date   PROLACTIN 61.5 (H) 06/05/2016   PROLACTIN 18.8 (H) 06/04/2016   Lab Results  Component Value Date   CHOL 166 03/11/2024   TRIG 102 03/11/2024   HDL 48 03/11/2024   CHOLHDL 3.5 03/11/2024   VLDL 20 03/11/2024   LDLCALC 98 03/11/2024   LDLCALC 91 02/24/2024    Physical Findings: AIMS:  , ,  ,  ,    CIWA:    COWS:      Psychiatric Specialty Exam:  Presentation  General Appearance:  Appropriate for Environment; Casual  Eye Contact: Fleeting  Speech: Clear and Coherent  Speech Volume: Decreased    Mood and Affect  Mood: Depressed; Dysphoric  Affect: Depressed; Flat   Thought Process  Thought Processes: Coherent  Descriptions of Associations:Intact  Orientation:Full (Time, Place and Person)  Thought Content:WDL  Hallucinations: Denies Ideas of Reference:None  Suicidal Thoughts: Denies Homicidal Thoughts: Denies  Sensorium  Memory: Recent Fair; Immediate Good  Judgment: Fair  Insight: Fair   Chartered certified accountant: Fair  Attention Span: Fair  Recall: [employer] of Knowledge: Fair  Language: Fair   Psychomotor Activity  Psychomotor Activity:No data recorded Musculoskeletal: Strength & Muscle Tone: within normal limits Gait & Station: normal Assets  Assets: Manufacturing systems engineer; Desire for Improvement; Financial Resources/Insurance; Resilience; Leisure Time    Physical Exam: Physical Exam Vitals and nursing note reviewed.  Constitutional:      Appearance: Normal appearance. He is normal weight.  HENT:     Head:  Normocephalic.     Nose: Nose normal.     Mouth/Throat:     Mouth: Mucous membranes are moist.  Eyes:     Conjunctiva/sclera: Conjunctivae normal.  Pulmonary:     Effort: Pulmonary effort is normal.  Musculoskeletal:        General: Normal range of motion.     Cervical back: Normal range of motion.  Neurological:     Mental Status: He is alert and oriented to person, place, and time.  Psychiatric:        Mood and Affect: Mood normal.    Review of Systems  Constitutional:  Negative for chills, fever, malaise/fatigue and weight loss.  Musculoskeletal:  Positive for joint pain.  Psychiatric/Behavioral:  Positive for hallucinations. Negative for suicidal ideas.    Blood pressure 118/78, pulse 75, temperature 97.7 F (36.5 C), resp. rate 18, height 5' 6 (1.676 m), weight 73 kg, SpO2 100%. Body mass index is 25.98 kg/m.  Diagnosis: Principal Problem:   Schizoaffective disorder (HCC) Active Problems:   Substance-induced psychotic disorder with hallucinations (HCC)   Clinical Decision Making: Patient currently admitted to inpatient unit for worsening hallucinations and depression in the context of multiple psychosocial stressors including being homeless, substance use, relapse on crack cocaine.  Patient will be monitored closely for medication adjustments.   Treatment Plan Summary:   Safety and Monitoring:             -- Voluntary admission to inpatient psychiatric unit for safety, stabilization and treatment             -- Daily contact with patient to assess and evaluate symptoms and progress in treatment             -- Patient's case to be discussed in multi-disciplinary team meeting             -- Observation Level: q15 minute checks             --  Vital signs:  q12 hours             -- Precautions: suicide, elopement, and assault   2. Psychiatric Diagnoses and Treatment:   --Continue Sertraline  100mg  daily -- Risperidone  3mg  twice daily  3. Medical Issues Being  Addressed: -- Left elbow Xray normal, continue Voltaren  gel prn pain.   4. Discharge Planning:   -- Social work and case management to assist with discharge planning and identification of hospital follow-up needs prior to discharge  -- Estimated LOS: 3-4 days  Antwone Capozzoli, MD 03/18/2024, 12:30 PM

## 2024-03-18 NOTE — Group Note (Signed)
 Date:  03/18/2024 Time:  1:09 PM  Group Topic/Focus:  Wellness Toolbox:   The focus of this group is to discuss various aspects of wellness, balancing those aspects and exploring ways to increase the ability to experience wellness.  Patients will create a wellness toolbox for use upon discharge.    Participation Level:  Active  Participation Quality:  Appropriate  Affect:  Appropriate  Cognitive:  Appropriate  Insight: Appropriate  Engagement in Group:  Engaged  Modes of Intervention:  Activity and Socialization  Additional Comments:    Charles Charles 03/18/2024, 1:09 PM

## 2024-03-18 NOTE — Group Note (Signed)
 Recreation Therapy Group Note   Group Topic:Leisure Education  Group Date: 03/18/2024 Start Time: 1500 End Time: 1600 Facilitators: Celestia Jeoffrey BRAVO, LRT, CTRS Location: Courtyard  Group Description: Outdoor Recreation. Patients had the option to play corn hole, ring toss, UNO, or listening to music while outside in the courtyard getting fresh air and sunlight. Patients helped water  and prune the raised garden beds. LRT and patients discussed things that they enjoy doing in their free time outside of the hospital. LRT encouraged patients to drink water  after being active and getting their heart rate up.   Goal Area(s) Addressed: Patient will identify leisure interests.  Patient will practice healthy decision making. Patient will engage in recreation activity.   Affect/Mood: Appropriate   Participation Level: Active and Engaged   Participation Quality: Independent   Behavior: Appropriate, Calm, and Cooperative   Speech/Thought Process: Coherent   Insight: Good   Judgement: Good   Modes of Intervention: Education, Exploration, and Music   Patient Response to Interventions:  Attentive, Engaged, Interested , and Receptive   Education Outcome:  Acknowledges education   Clinical Observations/Individualized Feedback: Josecarlos was active in their participation of session activities and group discussion. Pt chose to play UNO and corn hole while in group.    Plan: Continue to engage patient in RT group sessions 2-3x/week.   Jeoffrey BRAVO Celestia, LRT, CTRS 03/18/2024 4:55 PM

## 2024-03-18 NOTE — Group Note (Signed)
 Date:  03/18/2024 Time:  3:39 AM  Group Topic/Focus:  Making Healthy Choices:   The focus of this group is to help patients identify negative/unhealthy choices they were using prior to admission and identify positive/healthier coping strategies to replace them upon discharge.    Participation Level:  Active  Participation Quality:  Appropriate  Affect:  Appropriate  Cognitive:  Appropriate  Insight: Appropriate  Engagement in Group:  Engaged  Modes of Intervention:  Activity  Additional Comments:    Leigh VEAR Pais 03/18/2024, 3:39 AM

## 2024-03-18 NOTE — Group Note (Signed)
 [facility] LCSW Group Therapy Note   Group Date: 03/18/2024 Start Time: 1330 End Time: 1400   Type of Therapy/Topic:  Group Therapy:  Emotion Regulation  Participation Level:  Active   Mood:  Description of Group:    The purpose of this group is to assist patients in learning to regulate negative emotions and experience positive emotions. Patients will be guided to discuss ways in which they have been vulnerable to their negative emotions. These vulnerabilities will be juxtaposed with experiences of positive emotions or situations, and patients challenged to use positive emotions to combat negative ones. Special emphasis will be placed on coping with negative emotions in conflict situations, and patients will process healthy conflict resolution skills.  Therapeutic Goals: Patient will identify two positive emotions or experiences to reflect on in order to balance out negative emotions:  Patient will label two or more emotions that they find the most difficult to experience:  Patient will be able to demonstrate positive conflict resolution skills through discussion or role plays:   Summary of Patient Progress:   Pt was active and appropriate already    Therapeutic Modalities:   Cognitive Behavioral Therapy Feelings Identification Dialectical Behavioral Therapy   Charles Charles, LCSWA

## 2024-03-18 NOTE — Group Note (Signed)
 Date:  03/18/2024 Time:  8:35 PM  Group Topic/Focus:  Healthy Communication:   The focus of this group is to discuss communication, barriers to communication, as well as healthy ways to communicate with others.    Participation Level:  Active  Participation Quality:  Appropriate  Affect:  Appropriate  Cognitive:  Appropriate  Insight: Appropriate and Good  Engagement in Group:  Engaged  Modes of Intervention:  Discussion  Additional Comments:    Charles Charles 03/18/2024, 8:35 PM

## 2024-03-18 NOTE — Progress Notes (Signed)
   03/18/24 2000  Psych Admission Type (Psych Patients Only)  Admission Status Voluntary  Psychosocial Assessment  Patient Complaints None  Eye Contact Fair  Facial Expression Flat  Affect Flat  Speech Logical/coherent  Interaction Assertive  Motor Activity Slow  Appearance/Hygiene In scrubs  Behavior Characteristics Cooperative;Appropriate to situation  Mood Pleasant  Thought Process  Coherency WDL  Content WDL  Delusions None reported or observed  Perception WDL  Hallucination None reported or observed  Judgment Impaired  Confusion None  Danger to Self  Current suicidal ideation? Denies  Agreement Not to Harm Self Yes  Description of Agreement verbal  Danger to Others  Danger to Others None reported or observed   Progress note   D: Pt seen in dayroom. Pt denies SI, HI, AVH. Pt rates pain  0/10. Pt rates anxiety  0/10 and depression  0/10. Pt states he has had a good day. Asked about his discharge date. Per shift report, it is tentatively Sunday. No other concerns noted at this time.  A: Pt provided support and encouragement. Pt given scheduled medication as prescribed. PRNs as appropriate. Q15 min checks for safety.   R: Pt safe on the unit. Will continue to monitor.

## 2024-03-19 DIAGNOSIS — F259 Schizoaffective disorder, unspecified: Secondary | ICD-10-CM | POA: Diagnosis not present

## 2024-03-19 DIAGNOSIS — F19951 Other psychoactive substance use, unspecified with psychoactive substance-induced psychotic disorder with hallucinations: Secondary | ICD-10-CM | POA: Diagnosis not present

## 2024-03-19 MED ORDER — SERTRALINE HCL 100 MG PO TABS: 100.0000 mg | ORAL_TABLET | Freq: Every day | ORAL | 0 refills | Status: DC

## 2024-03-19 MED ORDER — DICLOFENAC SODIUM 1 % EX GEL: 2.0000 g | Freq: Four times a day (QID) | CUTANEOUS | 0 refills | Status: DC

## 2024-03-19 MED ORDER — RISPERIDONE 3 MG PO TABS: 3.0000 mg | ORAL_TABLET | Freq: Two times a day (BID) | ORAL | 0 refills | Status: DC

## 2024-03-19 NOTE — Progress Notes (Signed)
 [facility] MD Progress Note  03/19/2024 9:37 PM Charles Charles  MRN:  [id number]  Charles Charles is a 63 year old male with a past medical history of HIV, CVA, hypothyroidism, COPD, GERD, MDD and alcohol and cocaine use who has been admitted from [facility] after attempting to walk onto oncoming traffic. He states that he hears voices telling him to hurt himself, kill himself, and sometimes to hurt anyone in his way.  He reports hearing male voices, command type.  He reports the voices been happening for a long time.   Subjective:  Chart reviewed, case discussed in multidisciplinary meeting, patient seen during rounds.  Patient is noted to be resting in bed.  He offers no complaints.  He reports that he is ready for his discharge tomorrow.  He denies SI/HI/plan and denies hallucinations.  He is tolerating Risperdal  very well with no reported side effects including EPS.    Sleep: Fair  Appetite:  Good  Past Psychiatric History: see H&P Family History:  Family History  Problem Relation Age of Onset   Anxiety disorder Mother    Diabetes Mother    Diabetes Father    Social History:  Social History   Substance and Sexual Activity  Alcohol Use Yes   Alcohol/week: 56.0 standard drinks of alcohol   Types: 56 Cans of beer per week   Comment: 1 beer 2 xs a week - last drink 4-5 days ago     Social History   Substance and Sexual Activity  Drug Use Yes   Types: Cocaine, Marijuana   Comment: last use 3-4 days ago    Social History   Socioeconomic History   Marital status: Single    Spouse name: Not on file   Number of children: Not on file   Years of education: Not on file   Highest education level: 8th grade  Occupational History   Not on file  Tobacco Use   Smoking status: Every Day    Current packs/day: 1.00    Average packs/day: 1 pack/day for 0.7 years (0.7 ttl pk-yrs)    Types: Cigarettes    Start date: 2025   Smokeless tobacco: Never   Tobacco comments:    (1-2 cigs/day)  Vaping  Use   Vaping status: Never Used  Substance and Sexual Activity   Alcohol use: Yes    Alcohol/week: 56.0 standard drinks of alcohol    Types: 56 Cans of beer per week    Comment: 1 beer 2 xs a week - last drink 4-5 days ago   Drug use: Yes    Types: Cocaine, Marijuana    Comment: last use 3-4 days ago   Sexual activity: Not Currently    Partners: Female  Other Topics Concern   Not on file  Social History Narrative   Not on file   Social Drivers of Health   Financial Resource Strain: High Risk (11/06/2022)   Received from [facility]   Overall Financial Resource Strain (CARDIA)    Difficulty of Paying Living Expenses: Hard  Food Insecurity: No Food Insecurity (03/11/2024)   Hunger Vital Sign    Worried About Running Out of Food in the Last Year: Never true    Ran Out of Food in the Last Year: Never true  Transportation Needs: Unmet Transportation Needs (03/11/2024)   PRAPARE - Administrator, Civil Service (Medical): Yes    Lack of Transportation (Non-Medical): Yes  Physical Activity: Not on file  Stress: Not on  file  Social Connections: Not on file   Past Medical History:  Past Medical History:  Diagnosis Date   Alcohol abuse 01/18/2021   Alcohol use disorder, moderate, dependence (HCC) 04/10/2016   Cannabis use disorder, moderate, dependence (HCC) 05/06/2016   COPD (chronic obstructive pulmonary disease) (HCC) 12/03/2022   COPD exacerbation (HCC) 12/03/2022   Drug overdose 01/05/2022   GERD (gastroesophageal reflux disease)    Headache    History of syphilis 12/03/2022   Neutropenia (HCC) 12/03/2022   Schizo-affective schizophrenia (HCC)    Suicidal ideation 04/09/2016    Past Surgical History:  Procedure Laterality Date   COLONOSCOPY WITH PROPOFOL  N/A 08/04/2015   Procedure: COLONOSCOPY WITH PROPOFOL ;  Surgeon: Rogelia Copping, MD;  Location: [facility];  Service: Endoscopy;  Laterality: N/A;   INCISION AND DRAINAGE PERIRECTAL ABSCESS N/A  07/14/2015   Procedure: IRRIGATION AND DEBRIDEMENT PERIRECTAL ABSCESS;  Surgeon: Charlie FORBES Fell, MD;  Location: [facility];  Service: General;  Laterality: N/A;   INCISION AND DRAINAGE PERIRECTAL ABSCESS N/A 08/14/2015   Procedure: IRRIGATION AND DEBRIDEMENT PERIRECTAL ABSCESS;  Surgeon: Dorothyann LITTIE Husk, MD;  Location: [facility];  Service: General;  Laterality: N/A;    Current Medications: Current Facility-Administered Medications  Medication Dose Route Frequency Provider Last Rate Last Admin   acetaminophen  (TYLENOL ) tablet 650 mg  650 mg Oral Q6H PRN Brent, Amanda C, NP   650 mg at 03/12/24 1400   albuterol  (VENTOLIN  HFA) 108 (90 Base) MCG/ACT inhaler 2 puff  2 puff Inhalation Q6H PRN Brent, Amanda C, NP       alum & mag hydroxide-simeth (MAALOX/MYLANTA) 200-200-20 MG/5ML suspension 30 mL  30 mL Oral Q4H PRN Brent, Amanda C, NP       diclofenac  Sodium (VOLTAREN ) 1 % topical gel 2 g  2 g Topical QID Beatris Belen, MD   2 g at 03/19/24 2126   diphenhydrAMINE  (BENADRYL ) capsule 25 mg  25 mg Oral Q6H PRN Marline Morace, MD   25 mg at 03/19/24 1838   doravirin-lamivudin-tenofov df (DELSTRIGO ) 100-300-300 MG per tablet 1 tablet  1 tablet Oral Daily Jadin Kagel, MD   1 tablet at 03/19/24 9073   hydrOXYzine  (ATARAX ) tablet 25 mg  25 mg Oral TID PRN Brent, Amanda C, NP       magnesium  hydroxide (MILK OF MAGNESIA) suspension 30 mL  30 mL Oral Daily PRN Brent, Amanda C, NP       OLANZapine  (ZYPREXA ) injection 5 mg  5 mg Intramuscular TID PRN Brent, Amanda C, NP       OLANZapine  zydis (ZYPREXA ) disintegrating tablet 5 mg  5 mg Oral TID PRN Brent, Amanda C, NP       pantoprazole  (PROTONIX ) EC tablet 20 mg  20 mg Oral Daily Brent, Amanda C, NP   20 mg at 03/19/24 9073   risperiDONE  (RISPERDAL ) tablet 3 mg  3 mg Oral BID Alfard Cochrane, MD   3 mg at 03/19/24 2126   sertraline  (ZOLOFT ) tablet 100 mg  100 mg Oral Daily Sheilah Rayos, MD   100 mg at 03/19/24 9074    Lab Results:  No results found  for this or any previous visit (from the past 48 hours).   Blood Alcohol level:  Lab Results  Component Value Date   ETH <15 03/11/2024   ETH 185 (H) 11/10/2023    Metabolic Disorder Labs: Lab Results  Component Value Date   HGBA1C 5.7 (H) 03/11/2024   MPG 116.89 03/11/2024   MPG 137 12/02/2022  Lab Results  Component Value Date   PROLACTIN 61.5 (H) 06/05/2016   PROLACTIN 18.8 (H) 06/04/2016   Lab Results  Component Value Date   CHOL 166 03/11/2024   TRIG 102 03/11/2024   HDL 48 03/11/2024   CHOLHDL 3.5 03/11/2024   VLDL 20 03/11/2024   LDLCALC 98 03/11/2024   LDLCALC 91 02/24/2024    Physical Findings: AIMS:  , ,  ,  ,    CIWA:    COWS:      Psychiatric Specialty Exam:  Presentation  General Appearance:  Appropriate for Environment; Casual  Eye Contact: Fleeting  Speech: Clear and Coherent  Speech Volume: Decreased    Mood and Affect  Mood: fine  Affect: stable   Thought Process  Thought Processes: Coherent  Descriptions of Associations:Intact  Orientation:Full (Time, Place and Person)  Thought Content:WDL  Hallucinations: Denies Ideas of Reference:None  Suicidal Thoughts: Denies Homicidal Thoughts: Denies  Sensorium  Memory: Recent Fair; Immediate Good  Judgment: Fair  Insight: Fair   Chartered certified accountant: Fair  Attention Span: Fair  Recall: [employer] of Knowledge: Fair  Language: Fair   Psychomotor Activity  Psychomotor Activity:No data recorded Musculoskeletal: Strength & Muscle Tone: within normal limits Gait & Station: normal Assets  Assets: Manufacturing systems engineer; Desire for Improvement; Financial Resources/Insurance; Resilience; Leisure Time    Physical Exam: Physical Exam Vitals and nursing note reviewed.  Constitutional:      Appearance: Normal appearance. He is normal weight.  HENT:     Head: Normocephalic.     Nose: Nose normal.     Mouth/Throat:     Mouth: Mucous  membranes are moist.  Eyes:     Conjunctiva/sclera: Conjunctivae normal.  Pulmonary:     Effort: Pulmonary effort is normal.  Musculoskeletal:        General: Normal range of motion.     Cervical back: Normal range of motion.  Neurological:     Mental Status: He is alert and oriented to person, place, and time.  Psychiatric:        Mood and Affect: Mood normal.    Review of Systems  Constitutional:  Negative for chills, fever, malaise/fatigue and weight loss.  Musculoskeletal:  Positive for joint pain.  Psychiatric/Behavioral:  Positive for hallucinations. Negative for suicidal ideas.    Blood pressure 105/73, pulse (!) 57, temperature 97.8 F (36.6 C), resp. rate 16, height 5' 6 (1.676 m), weight 73 kg, SpO2 100%. Body mass index is 25.98 kg/m.  Diagnosis: Principal Problem:   Schizoaffective disorder (HCC) Active Problems:   Substance-induced psychotic disorder with hallucinations (HCC)   Clinical Decision Making: Patient currently admitted to inpatient unit for worsening hallucinations and depression in the context of multiple psychosocial stressors including being homeless, substance use, relapse on crack cocaine.  Patient will be monitored closely for medication adjustments.   Treatment Plan Summary:   Safety and Monitoring:             -- Voluntary admission to inpatient psychiatric unit for safety, stabilization and treatment             -- Daily contact with patient to assess and evaluate symptoms and progress in treatment             -- Patient's case to be discussed in multi-disciplinary team meeting             -- Observation Level: q15 minute checks             --  Vital signs:  q12 hours             -- Precautions: suicide, elopement, and assault   2. Psychiatric Diagnoses and Treatment:   --Continue Sertraline  100mg  daily -- Risperidone  3mg  twice daily  3. Medical Issues Being Addressed: -- Left elbow Xray normal, continue Voltaren  gel prn pain.   4.  Discharge Planning:   -- Social work and case management to assist with discharge planning and identification of hospital follow-up needs prior to discharge  -- Estimated LOS: 3-4 days  Marshon Bangs, MD 03/19/2024, 9:37 PM

## 2024-03-19 NOTE — Progress Notes (Signed)
   03/19/24 1200  Psych Admission Type (Psych Patients Only)  Admission Status Voluntary  Psychosocial Assessment  Patient Complaints None  Eye Contact Fair  Facial Expression Flat  Affect Flat  Speech Incoherent  Interaction Assertive  Motor Activity Slow  Appearance/Hygiene In scrubs  Behavior Characteristics Cooperative;Appropriate to situation  Mood Pleasant  Thought Process  Coherency WDL  Content WDL  Delusions None reported or observed  Perception WDL  Hallucination None reported or observed  Judgment Impaired  Confusion None  Danger to Self  Current suicidal ideation? Denies  Self-Injurious Behavior No self-injurious ideation or behavior indicators observed or expressed   Agreement Not to Harm Self Yes  Description of Agreement Verbal  Danger to Others  Danger to Others None reported or observed

## 2024-03-19 NOTE — Plan of Care (Signed)
  Problem: Education: Goal: Will be free of psychotic symptoms Outcome: Progressing Goal: Knowledge of the prescribed therapeutic regimen will improve Outcome: Progressing   Problem: Coping: Goal: Will verbalize feelings Outcome: Progressing   Problem: Health Behavior/Discharge Planning: Goal: Compliance with prescribed medication regimen will improve Outcome: Progressing   Problem: Nutritional: Goal: Ability to achieve adequate nutritional intake will improve Outcome: Progressing   Problem: Safety: Goal: Ability to remain free from injury will improve Outcome: Progressing   Problem: Education: Goal: Emotional status will improve Outcome: Progressing

## 2024-03-19 NOTE — BHH Suicide Risk Assessment (Signed)
 [facility] Discharge Suicide Risk Assessment   Principal Problem: Schizoaffective disorder [facility]) Discharge Diagnoses: Principal Problem:   Schizoaffective disorder (HCC) Active Problems:   Substance-induced psychotic disorder with hallucinations (HCC)   Total Time spent with patient: 30 minutes  Musculoskeletal: Strength & Muscle Tone: within normal limits Gait & Station: normal Patient leans: N/A  Psychiatric Specialty Exam  Presentation  General Appearance:  Appropriate for Environment; Casual  Eye Contact: Fair  Speech: Clear and Coherent  Speech Volume: Normal  Handedness: Right   Mood and Affect  Mood: Euthymic  Duration of Depression Symptoms: Greater than two weeks  Affect: Appropriate   Thought Process  Thought Processes: Coherent  Descriptions of Associations:Intact  Orientation:Full (Time, Place and Person)  Thought Content:Logical  History of Schizophrenia/Schizoaffective disorder:No  Duration of Psychotic Symptoms:Greater than six months  Hallucinations:Hallucinations: None  Ideas of Reference:None  Suicidal Thoughts:Suicidal Thoughts: No  Homicidal Thoughts:Homicidal Thoughts: No   Sensorium  Memory: Immediate Fair; Recent Fair; Remote Fair  Judgment: Fair  Insight: Fair   Art therapist  Concentration: Fair  Attention Span: Fair  Recall: [employer] of Knowledge: Fair  Language: Fair   Psychomotor Activity  Psychomotor Activity: Psychomotor Activity: Normal   Assets  Assets: Communication Skills; Desire for Improvement; Social Support   Sleep  Sleep: Sleep: Fair  Estimated Sleeping Duration (Last 24 Hours): 8.00-10.25 hours  Physical Exam: Physical Exam Vitals and nursing note reviewed.    ROS Blood pressure 105/73, pulse (!) 57, temperature 97.8 F (36.6 C), resp. rate 16, height 5' 6 (1.676 m), weight 73 kg, SpO2 100%. Body mass index is 25.98 kg/m.  Mental Status Per Nursing  Assessment::   On Admission:  Suicidal ideation indicated by patient  Demographic Factors:  Low socioeconomic status  Loss Factors: Decrease in vocational status  Historical Factors: Impulsivity  Risk Reduction Factors:   Positive social support, Positive therapeutic relationship, and Positive coping skills or problem solving skills  Continued Clinical Symptoms:  Depression:   Impulsivity  Cognitive Features That Contribute To Risk:  None    Suicide Risk:  Minimal: No identifiable suicidal ideation.  Patients presenting with no risk factors but with morbid ruminations; may be classified as minimal risk based on the severity of the depressive symptoms    Plan Of Care/Follow-up recommendations:  Activity:  As tolerated  Zyire Eidson, MD 03/19/2024, 9:47 PM

## 2024-03-19 NOTE — Progress Notes (Signed)
(  Sleep Hours) - 9.75 (Any PRNs that were needed, meds refused, or side effects to meds)- none (Any disturbances and when (visitation, over night)- none (Concerns raised by the patient)- none  (SI/HI/AVH)- denies

## 2024-03-19 NOTE — Group Note (Signed)
 Physical/Occupational Therapy Group Note  Group Topic: UE Therex   Group Date: 03/19/2024 Start Time: 1315 End Time: 1355 Facilitators: Clive Warren CROME, OT   Group Description: Group instructed in series of upper extremities exercises, aimed to promote strength, flexibility, range of motion and functional endurance.  Patients provided cuing for proper mechanics and proper pace of exercise; exercises adjusted as necessary for individualized patient needs.  Patient also engaged in cognitive components throughout session, working to integrate attention to task, command following, turn-taking and appropriate social interaction throughout session.  Allowed to ask questions as appropriate, and encouraged to identify specific exercises that they could complete independently outside of group sessions.   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 Educated in modifications for each exercises and pt able to return demo with fair carryover for technique. Contributed to group discussion with prompting.   Participation Level: Active and Engaged   Participation Quality: Minimal Cues and Moderate Cues   Behavior: Alert, Appropriate, Attentive , and Calm   Speech/Thought Process: Organized and Relevant   Affect/Mood: Appropriate and Stable    Insight: Moderate   Judgement: Moderate   Modes of Intervention: Activity, Clarification, Discussion, Education, Exploration, Problem-solving, Socialization, and Support  Patient Response to Interventions:  Attentive, Engaged, and Receptive   Plan: Continue to engage patient in PT/OT groups 1 - 2x/week.  Tabor Bartram R.,  MPH, MS, OTR/L ascom [phone] 03/19/24, 4:15 PM

## 2024-03-19 NOTE — Plan of Care (Signed)
   Problem: Coping: Goal: Coping ability will improve Outcome: Progressing Goal: Will verbalize feelings Outcome: Progressing

## 2024-03-19 NOTE — Group Note (Signed)
 Recreation Therapy Group Note   Group Topic:Emotion Expression  Group Date: 03/19/2024 Start Time: 1500 End Time: 1600 Facilitators: Celestia Jeoffrey BRAVO, LRT, CTRS Location: Dayroom  Group Description: Painting a Diplomatic Services operational officer. Patients and LRT discuss what it means to be "at peace", what it feels like physically and mentally. Pts are given a canvas and watercolor paint to use and encouraged to draw their idea of a peaceful place. Pts and LRT discuss how they use this in their daily life post discharge. Pts are encouraged to take their canvas home with them as a reminder to find their peaceful place whenever they are feeling depressed, anxious, etc.    Goal Area(s) Addressed:  Patient will identify what it means to experience a "peaceful" emotion. Patient will identify a new coping skill.  Patient will express their emotions through art. Patients will increase communication by talking with LRT and peers while in group.   Affect/Mood: Appropriate   Participation Level: Active and Engaged   Participation Quality: Independent   Behavior: Appropriate, Calm, and Cooperative   Speech/Thought Process: Coherent   Insight: Good   Judgement: Good   Modes of Intervention: Art   Patient Response to Interventions:  Attentive, Engaged, Interested , and Receptive   Education Outcome:  Acknowledges education   Clinical Observations/Individualized Feedback: Charles Charles was active in their participation of session activities and group discussion. Pt identified the ocean as his peaceful place. Pt interacted well with LRT and peers duration of session.    Plan: Continue to engage patient in RT group sessions 2-3x/week.   Jeoffrey BRAVO Celestia, LRT, CTRS 03/19/2024 5:06 PM

## 2024-03-19 NOTE — Group Note (Signed)
 Date:  03/19/2024 Time:  8:29 PM  Group Topic/Focus:  Overcoming Stress:   The focus of this group is to define stress and help patients assess their triggers.    Participation Level:  Active  Participation Quality:  Appropriate  Affect:  Appropriate  Cognitive:  Appropriate  Insight: Appropriate and Good  Engagement in Group:  Engaged  Modes of Intervention:  Discussion  Additional Comments:    Romero Earnie Hope 03/19/2024, 8:29 PM

## 2024-03-19 NOTE — Discharge Summary (Incomplete)
 Physician Discharge Summary Note  Patient:  Charles Charles is an 63 y.o., male MRN:  [id number] DOB:  06-22-1961 Patient phone:  [phone] (home)  Patient address:   [address] [place] [address],   Total time spent: 40 min Date of Admission:  03/11/2024 Date of Discharge: ***  Reason for Admission:  ***  Principal Problem: Schizoaffective disorder [facility]) Discharge Diagnoses: Principal Problem:   Schizoaffective disorder (HCC) Active Problems:   Substance-induced psychotic disorder with hallucinations (HCC)   Past Psychiatric History: ***  Family Psychiatric  History: *** Social History:  Social History   Substance and Sexual Activity  Alcohol Use Yes   Alcohol/week: 56.0 standard drinks of alcohol   Types: 56 Cans of beer per week   Comment: 1 beer 2 xs a week - last drink 4-5 days ago     Social History   Substance and Sexual Activity  Drug Use Yes   Types: Cocaine, Marijuana   Comment: last use 3-4 days ago    Social History   Socioeconomic History   Marital status: Single    Spouse name: Not on file   Number of children: Not on file   Years of education: Not on file   Highest education level: 8th grade  Occupational History   Not on file  Tobacco Use   Smoking status: Every Day    Current packs/day: 1.00    Average packs/day: 1 pack/day for 0.7 years (0.7 ttl pk-yrs)    Types: Cigarettes    Start date: 2025   Smokeless tobacco: Never   Tobacco comments:    (1-2 cigs/day)  Vaping Use   Vaping status: Never Used  Substance and Sexual Activity   Alcohol use: Yes    Alcohol/week: 56.0 standard drinks of alcohol    Types: 56 Cans of beer per week    Comment: 1 beer 2 xs a week - last drink 4-5 days ago   Drug use: Yes    Types: Cocaine, Marijuana    Comment: last use 3-4 days ago   Sexual activity: Not Currently    Partners: Female  Other Topics Concern   Not on file  Social History Narrative   Not on file   Social Drivers of  Health   Financial Resource Strain: High Risk (11/06/2022)   Received from [facility]   Overall Financial Resource Strain (CARDIA)    Difficulty of Paying Living Expenses: Hard  Food Insecurity: No Food Insecurity (03/11/2024)   Hunger Vital Sign    Worried About Running Out of Food in the Last Year: Never true    Ran Out of Food in the Last Year: Never true  Transportation Needs: Unmet Transportation Needs (03/11/2024)   PRAPARE - Administrator, Civil Service (Medical): Yes    Lack of Transportation (Non-Medical): Yes  Physical Activity: Not on file  Stress: Not on file  Social Connections: Not on file   Past Medical History:  Past Medical History:  Diagnosis Date   Alcohol abuse 01/18/2021   Alcohol use disorder, moderate, dependence (HCC) 04/10/2016   Cannabis use disorder, moderate, dependence (HCC) 05/06/2016   COPD (chronic obstructive pulmonary disease) (HCC) 12/03/2022   COPD exacerbation (HCC) 12/03/2022   Drug overdose 01/05/2022   GERD (gastroesophageal reflux disease)    Headache    History of syphilis 12/03/2022   Neutropenia (HCC) 12/03/2022   Schizo-affective schizophrenia (HCC)    Suicidal ideation 04/09/2016    Past Surgical History:  Procedure Laterality Date   COLONOSCOPY WITH PROPOFOL  N/A 08/04/2015   Procedure: COLONOSCOPY WITH PROPOFOL ;  Surgeon: Rogelia Copping, MD;  Location: [facility];  Service: Endoscopy;  Laterality: N/A;   INCISION AND DRAINAGE PERIRECTAL ABSCESS N/A 07/14/2015   Procedure: IRRIGATION AND DEBRIDEMENT PERIRECTAL ABSCESS;  Surgeon: Charlie FORBES Fell, MD;  Location: [facility];  Service: General;  Laterality: N/A;   INCISION AND DRAINAGE PERIRECTAL ABSCESS N/A 08/14/2015   Procedure: IRRIGATION AND DEBRIDEMENT PERIRECTAL ABSCESS;  Surgeon: Dorothyann LITTIE Husk, MD;  Location: [facility];  Service: General;  Laterality: N/A;   Family History:  Family History  Problem Relation Age of Onset   Anxiety disorder Mother     Diabetes Mother    Diabetes Father     Hospital Course:  *** Detailed risk assessment is complete based on clinical exam and individual risk factors and acute suicide risk is low and acute violence risk is low.     Currently, all modifiable risk of harm to self/harm to others have been addressed and patient is no longer appropriate for the acute inpatient setting and is able to continue treatment for mental health needs in the community with the supports as indicated below.  Patient is educated and verbalized understanding of discharge plan of care including medications, follow-up appointments, mental health resources and further crisis services in the community.  He is instructed to call 911 or present to the nearest emergency room should he experience any decompensation in mood, disturbance of bowel or return of suicidal/homicidal ideations.  Patient verbalizes understanding of this education and agrees to this plan of care  Physical Findings: AIMS:  , ,  ,  ,    CIWA:    COWS:        Psychiatric Specialty Exam:  Presentation  General Appearance:  Appropriate for Environment; Casual  Eye Contact: Fair  Speech: Clear and Coherent  Speech Volume: Normal    Mood and Affect  Mood: Euthymic  Affect: Appropriate   Thought Process  Thought Processes: Coherent  Descriptions of Associations:Intact  Orientation:Full (Time, Place and Person)  Thought Content:Logical  Hallucinations:Hallucinations: None  Ideas of Reference:None  Suicidal Thoughts:Suicidal Thoughts: No  Homicidal Thoughts:Homicidal Thoughts: No   Sensorium  Memory: Immediate Fair; Recent Fair; Remote Fair  Judgment: Fair  Insight: Fair   Art therapist  Concentration: Fair  Attention Span: Fair  Recall: [employer] of Knowledge: Fair  Language: Fair   Psychomotor Activity  Psychomotor Activity: Psychomotor Activity: Normal  Musculoskeletal: Strength & Muscle Tone:  {desc; muscle tone:32375} Gait & Station: {PE GAIT ED WJUO:77474} Assets  Assets: Communication Skills; Desire for Improvement; Social Support   Sleep  Sleep: Sleep: Fair    Physical Exam: Physical Exam ROS Blood pressure 105/73, pulse (!) 57, temperature 97.8 F (36.6 C), resp. rate 16, height 5' 6 (1.676 m), weight 73 kg, SpO2 100%. Body mass index is 25.98 kg/m.   Social History   Tobacco Use  Smoking Status Every Day   Current packs/day: 1.00   Average packs/day: 1 pack/day for 0.7 years (0.7 ttl pk-yrs)   Types: Cigarettes   Start date: 2025  Smokeless Tobacco Never  Tobacco Comments   (1-2 cigs/day)   Tobacco Cessation:  {Discharge tobacco cessation prescription:304700209}   Blood Alcohol level:  Lab Results  Component Value Date   ETH <15 03/11/2024   ETH 185 (H) 11/10/2023    Metabolic Disorder Labs:  Lab Results  Component Value Date   HGBA1C 5.7 (H) 03/11/2024  MPG 116.89 03/11/2024   MPG 137 12/02/2022   Lab Results  Component Value Date   PROLACTIN 61.5 (H) 06/05/2016   PROLACTIN 18.8 (H) 06/04/2016   Lab Results  Component Value Date   CHOL 166 03/11/2024   TRIG 102 03/11/2024   HDL 48 03/11/2024   CHOLHDL 3.5 03/11/2024   VLDL 20 03/11/2024   LDLCALC 98 03/11/2024   LDLCALC 91 02/24/2024    See Psychiatric Specialty Exam and Suicide Risk Assessment completed by Attending Physician prior to discharge.  Discharge destination:  {DISCHARGE DESTINATION:22616}  Is patient on multiple antipsychotic therapies at discharge:  {RECOMMEND TAPERING:22617}   Has Patient had three or more failed trials of antipsychotic monotherapy by history:  {[facility] ANTIPSYCHOTIC:22903}  Recommended Plan for Multiple Antipsychotic Therapies: {[facility] MULTIPLE ANTIPSYCHOTIC THERAPIES:22905}   Allergies as of 03/19/2024       Reactions   Sulfa  Antibiotics Itching        Medication List     STOP taking these medications    OLANZapine  15 MG  tablet Commonly known as: ZYPREXA        TAKE these medications      Indication  albuterol  108 (90 Base) MCG/ACT inhaler Commonly known as: VENTOLIN  HFA Inhale 2 puffs into the lungs every 6 (six) hours as needed for wheezing or shortness of breath.    Delstrigo  100-300-300 MG Tabs per tablet Generic drug: doravirin-lamivudin-tenofov df Take 1 tablet by mouth daily.  Indication: HIV Disease   diclofenac  Sodium 1 % Gel Commonly known as: VOLTAREN  Apply 2 g topically 4 (four) times daily. Start taking on: March 20, 2024    hydrOXYzine  25 MG tablet Commonly known as: ATARAX  Take 1 tablet (25 mg total) by mouth 3 (three) times daily as needed for anxiety. What changed: when to take this  Indication: Feeling Anxious   pantoprazole  20 MG tablet Commonly known as: PROTONIX  Take 1 tablet (20 mg total) by mouth daily.  Indication: Gastroesophageal Reflux Disease   risperiDONE  3 MG tablet Commonly known as: RISPERDAL  Take 1 tablet (3 mg total) by mouth 2 (two) times daily. Start taking on: March 20, 2024  Indication: Schizophrenia   sertraline  100 MG tablet Commonly known as: ZOLOFT  Take 1 tablet (100 mg total) by mouth daily. Start taking on: March 20, 2024 What changed:  medication strength how much to take  Indication: Major Depressive Disorder         Follow-up recommendations:  {[facility] DC FU RECOMMENDATIONS:22620}    Signed: Allyn Foil, MD 03/19/2024, 9:51 PM

## 2024-03-20 NOTE — Progress Notes (Signed)
   03/19/24 2317  Psych Admission Type (Psych Patients Only)  Admission Status Voluntary  Psychosocial Assessment  Patient Complaints None  Eye Contact Fair  Facial Expression Flat  Affect Flat  Speech Logical/coherent  Interaction Assertive  Motor Activity Slow  Appearance/Hygiene In scrubs  Behavior Characteristics Appropriate to situation;Cooperative  Mood Pleasant  Thought Process  Coherency WDL  Content WDL  Delusions None reported or observed  Perception WDL  Hallucination None reported or observed  Judgment Impaired  Confusion None  Danger to Self  Current suicidal ideation? Denies  Self-Injurious Behavior No self-injurious ideation or behavior indicators observed or expressed   Agreement Not to Harm Self Yes  Description of Agreement verbal  Danger to Others  Danger to Others None reported or observed

## 2024-03-20 NOTE — Group Note (Signed)
 Date:  03/20/2024 Time:  8:39 PM  Group Topic/Focus:  Goals Group:   The focus of this group is to help patients establish daily goals to achieve during treatment and discuss how the patient can incorporate goal setting into their daily lives to aide in recovery.    Participation Level:  Active  Participation Quality:  Appropriate  Affect:  Appropriate  Cognitive:  Appropriate  Insight: Appropriate and Good  Engagement in Group:  Engaged  Modes of Intervention:  Discussion  Additional Comments:    Charles Charles 03/20/2024, 8:39 PM

## 2024-03-20 NOTE — Plan of Care (Signed)
  Problem: Safety: Goal: Ability to redirect hostility and anger into socially appropriate behaviors will improve Outcome: Progressing Goal: Ability to remain free from injury will improve Outcome: Progressing

## 2024-03-20 NOTE — Progress Notes (Signed)
 [facility] MD Progress Note  03/20/2024 5:46 PM JESTON JUNKINS  MRN:  [id number]  Galen Russman is a 63 year old male with a past medical history of HIV, CVA, hypothyroidism, COPD, GERD, MDD and alcohol and cocaine use who has been admitted from [facility] after attempting to walk onto oncoming traffic. He states that he hears voices telling him to hurt himself, kill himself, and sometimes to hurt anyone in his way.  He reports hearing male voices, command type.  He reports the voices been happening for a long time.   Subjective:    Per nursing report patient appears to be doing well.  No concerns.  Calm cooperative.  Patient on interview reported that he is doing well and would like to get discharged.  Denies any suicidal homicidal ideations or any auditory visual hallucinations.  Patient reports that he has been diagnosed with schizophrenia when he was around 63 years of age.  Reports that he was sober off drugs between 2014 to 2020 and was still hearing voices during the sobriety.  Per chart review patient had urine drug screen positive for cocaine and cannabis even in 2016, 2017 also.  Patient reports that he started using drugs at quite early age.  Reports that he has tried to kill himself 4-5 times in the past.  Patient reports no side effects of the medication.   Patient reports that he wants to go home and does not want to go to rehab.  Patient's UDS was positive for cocaine and marijuana this time.    Talked to the social worker and unfortunately patient outpatient follow-up is not arranged and not be able to be discharged over the weekend.  Sleep: Fair  Appetite:  Good  Past Psychiatric History: see H&P Family History:  Family History  Problem Relation Age of Onset   Anxiety disorder Mother    Diabetes Mother    Diabetes Father    Social History:  Social History   Substance and Sexual Activity  Alcohol Use Yes   Alcohol/week: 56.0 standard drinks of alcohol   Types: 56 Cans of beer per week    Comment: 1 beer 2 xs a week - last drink 4-5 days ago     Social History   Substance and Sexual Activity  Drug Use Yes   Types: Cocaine, Marijuana   Comment: last use 3-4 days ago    Social History   Socioeconomic History   Marital status: Single    Spouse name: Not on file   Number of children: Not on file   Years of education: Not on file   Highest education level: 8th grade  Occupational History   Not on file  Tobacco Use   Smoking status: Every Day    Current packs/day: 1.00    Average packs/day: 1 pack/day for 0.7 years (0.7 ttl pk-yrs)    Types: Cigarettes    Start date: 2025   Smokeless tobacco: Never   Tobacco comments:    (1-2 cigs/day)  Vaping Use   Vaping status: Never Used  Substance and Sexual Activity   Alcohol use: Yes    Alcohol/week: 56.0 standard drinks of alcohol    Types: 56 Cans of beer per week    Comment: 1 beer 2 xs a week - last drink 4-5 days ago   Drug use: Yes    Types: Cocaine, Marijuana    Comment: last use 3-4 days ago   Sexual activity: Not Currently    Partners: Female  Other Topics Concern   Not on file  Social History Narrative   Not on file   Social Drivers of Health   Financial Resource Strain: High Risk (11/06/2022)   Received from [facility]   Overall Financial Resource Strain (CARDIA)    Difficulty of Paying Living Expenses: Hard  Food Insecurity: No Food Insecurity (03/11/2024)   Hunger Vital Sign    Worried About Running Out of Food in the Last Year: Never true    Ran Out of Food in the Last Year: Never true  Transportation Needs: Unmet Transportation Needs (03/11/2024)   PRAPARE - Administrator, Civil Service (Medical): Yes    Lack of Transportation (Non-Medical): Yes  Physical Activity: Not on file  Stress: Not on file  Social Connections: Not on file   Past Medical History:  Past Medical History:  Diagnosis Date   Alcohol abuse 01/18/2021   Alcohol use disorder, moderate, dependence (HCC)  04/10/2016   Cannabis use disorder, moderate, dependence (HCC) 05/06/2016   COPD (chronic obstructive pulmonary disease) (HCC) 12/03/2022   COPD exacerbation (HCC) 12/03/2022   Drug overdose 01/05/2022   GERD (gastroesophageal reflux disease)    Headache    History of syphilis 12/03/2022   Neutropenia (HCC) 12/03/2022   Schizo-affective schizophrenia (HCC)    Suicidal ideation 04/09/2016    Past Surgical History:  Procedure Laterality Date   COLONOSCOPY WITH PROPOFOL  N/A 08/04/2015   Procedure: COLONOSCOPY WITH PROPOFOL ;  Surgeon: Rogelia Copping, MD;  Location: [facility];  Service: Endoscopy;  Laterality: N/A;   INCISION AND DRAINAGE PERIRECTAL ABSCESS N/A 07/14/2015   Procedure: IRRIGATION AND DEBRIDEMENT PERIRECTAL ABSCESS;  Surgeon: Charlie FORBES Fell, MD;  Location: [facility];  Service: General;  Laterality: N/A;   INCISION AND DRAINAGE PERIRECTAL ABSCESS N/A 08/14/2015   Procedure: IRRIGATION AND DEBRIDEMENT PERIRECTAL ABSCESS;  Surgeon: Dorothyann LITTIE Husk, MD;  Location: [facility];  Service: General;  Laterality: N/A;    Current Medications: Current Facility-Administered Medications  Medication Dose Route Frequency Provider Last Rate Last Admin   acetaminophen  (TYLENOL ) tablet 650 mg  650 mg Oral Q6H PRN Brent, Amanda C, NP   650 mg at 03/12/24 1400   albuterol  (VENTOLIN  HFA) 108 (90 Base) MCG/ACT inhaler 2 puff  2 puff Inhalation Q6H PRN Brent, Amanda C, NP       alum & mag hydroxide-simeth (MAALOX/MYLANTA) 200-200-20 MG/5ML suspension 30 mL  30 mL Oral Q4H PRN Brent, Amanda C, NP       diclofenac  Sodium (VOLTAREN ) 1 % topical gel 2 g  2 g Topical QID Jadapalle, Sree, MD   2 g at 03/20/24 1710   diphenhydrAMINE  (BENADRYL ) capsule 25 mg  25 mg Oral Q6H PRN Jadapalle, Sree, MD   25 mg at 03/20/24 9380   doravirin-lamivudin-tenofov df (DELSTRIGO ) 100-300-300 MG per tablet 1 tablet  1 tablet Oral Daily Jadapalle, Sree, MD   1 tablet at 03/20/24 0931   hydrOXYzine  (ATARAX ) tablet 25  mg  25 mg Oral TID PRN Brent, Amanda C, NP       magnesium  hydroxide (MILK OF MAGNESIA) suspension 30 mL  30 mL Oral Daily PRN Brent, Amanda C, NP       OLANZapine  (ZYPREXA ) injection 5 mg  5 mg Intramuscular TID PRN Brent, Amanda C, NP       OLANZapine  zydis (ZYPREXA ) disintegrating tablet 5 mg  5 mg Oral TID PRN Brent, Amanda C, NP       pantoprazole  (PROTONIX ) EC tablet 20 mg  20 mg Oral Daily Brent, Amanda C, NP   20 mg at 03/20/24 0932   risperiDONE  (RISPERDAL ) tablet 3 mg  3 mg Oral BID Jadapalle, Sree, MD   3 mg at 03/20/24 0932   sertraline  (ZOLOFT ) tablet 100 mg  100 mg Oral Daily Jadapalle, Sree, MD   100 mg at 03/20/24 0932    Lab Results:  No results found for this or any previous visit (from the past 48 hours).   Blood Alcohol level:  Lab Results  Component Value Date   ETH <15 03/11/2024   ETH 185 (H) 11/10/2023    Metabolic Disorder Labs: Lab Results  Component Value Date   HGBA1C 5.7 (H) 03/11/2024   MPG 116.89 03/11/2024   MPG 137 12/02/2022   Lab Results  Component Value Date   PROLACTIN 61.5 (H) 06/05/2016   PROLACTIN 18.8 (H) 06/04/2016   Lab Results  Component Value Date   CHOL 166 03/11/2024   TRIG 102 03/11/2024   HDL 48 03/11/2024   CHOLHDL 3.5 03/11/2024   VLDL 20 03/11/2024   LDLCALC 98 03/11/2024   LDLCALC 91 02/24/2024    Physical Findings: AIMS:  , ,  ,  ,    CIWA:    COWS:      Psychiatric Specialty Exam:  Presentation  General Appearance:  Appropriate for Environment; Casual  Eye Contact: Fair  Speech: Clear and Coherent  Speech Volume: Normal    Mood and Affect  Mood: fine  Affect: stable   Thought Process  Thought Processes: Coherent  Descriptions of Associations:Intact  Orientation:Full (Time, Place and Person)  Thought Content:Logical  Hallucinations: Denies Ideas of Reference:None  Suicidal Thoughts: Denies Homicidal Thoughts: Denies  Sensorium  Memory: Immediate Fair; Recent Fair; Remote  Fair  Judgment: Fair  Insight: Fair   Art therapist  Concentration: Fair  Attention Span: Fair  Recall: [employer] of Knowledge: Fair  Language: Fair   Psychomotor Activity  Psychomotor Activity:Psychomotor Activity: Normal  Musculoskeletal: Strength & Muscle Tone: within normal limits Gait & Station: normal Assets  Assets: Manufacturing systems engineer; Desire for Improvement; Social Support    Physical Exam: Physical Exam Vitals and nursing note reviewed.  Constitutional:      Appearance: Normal appearance. He is normal weight.  HENT:     Head: Normocephalic.     Nose: Nose normal.     Mouth/Throat:     Mouth: Mucous membranes are moist.  Eyes:     Conjunctiva/sclera: Conjunctivae normal.  Pulmonary:     Effort: Pulmonary effort is normal.  Musculoskeletal:        General: Normal range of motion.     Cervical back: Normal range of motion.  Neurological:     Mental Status: He is alert and oriented to person, place, and time.  Psychiatric:        Mood and Affect: Mood normal.    Review of Systems  Constitutional:  Negative for chills, fever, malaise/fatigue and weight loss.  Musculoskeletal:  Positive for joint pain.  Psychiatric/Behavioral:  Positive for hallucinations. Negative for suicidal ideas.    Blood pressure 112/76, pulse 68, temperature 97.9 F (36.6 C), resp. rate 18, height 5' 6 (1.676 m), weight 73 kg, SpO2 100%. Body mass index is 25.98 kg/m.  Diagnosis: Principal Problem:   Schizoaffective disorder (HCC) Active Problems:   Substance-induced psychotic disorder with hallucinations (HCC)   Clinical Decision Making: Patient currently admitted to inpatient unit for worsening hallucinations and depression in the context of multiple psychosocial  stressors including being homeless, substance use, relapse on crack cocaine.  Patient will be monitored closely for medication adjustments.   Treatment Plan Summary:   Safety and  Monitoring:             -- Voluntary admission to inpatient psychiatric unit for safety, stabilization and treatment             -- Daily contact with patient to assess and evaluate symptoms and progress in treatment             -- Patient's case to be discussed in multi-disciplinary team meeting             -- Observation Level: q15 minute checks             -- Vital signs:  q12 hours             -- Precautions: suicide, elopement, and assault   2. Psychiatric Diagnoses and Treatment:   --Continue Sertraline  100mg  daily -- Risperidone  3mg  twice daily  3. Medical Issues Being Addressed: -- Left elbow Xray normal, continue Voltaren  gel prn pain.    HIV continue anti-retroviral therapy.  GERD  continue Protonix .   COPD/ shortness of breath  albuterol  as needed  4. Discharge Planning:   -- Social work and case management to assist with discharge planning and identification of hospital follow-up needs prior to discharge  -- Estimated LOS: 3-4 days  Desmond Chimera, MD 03/20/2024, 5:46 PM

## 2024-03-20 NOTE — Progress Notes (Signed)
   03/20/24 2100  Psych Admission Type (Psych Patients Only)  Admission Status Voluntary  Psychosocial Assessment  Patient Complaints None  Eye Contact Fair  Facial Expression Flat  Affect Flat  Speech Logical/coherent  Interaction Assertive  Motor Activity Slow  Appearance/Hygiene Unremarkable  Behavior Characteristics Appropriate to situation  Mood Pleasant  Thought Process  Coherency WDL  Content WDL  Delusions None reported or observed  Perception WDL  Hallucination None reported or observed  Judgment WDL  Confusion None  Danger to Self  Current suicidal ideation? Denies  Self-Injurious Behavior No self-injurious ideation or behavior indicators observed or expressed   Agreement Not to Harm Self Yes  Description of Agreement verbal  Danger to Others  Danger to Others None reported or observed

## 2024-03-20 NOTE — Group Note (Signed)
 Date:  03/20/2024 Time:  10:20 AM  Group Topic/Focus:  Movement Therapy    Participation Level:  Active  Participation Quality:  Appropriate  Affect:  Appropriate  Cognitive:  Appropriate  Insight: Appropriate  Engagement in Group:  Engaged  Modes of Intervention:  Activity  Additional Comments:  none  Norleen SHAUNNA Bias 03/20/2024, 10:20 AM

## 2024-03-20 NOTE — Plan of Care (Signed)
  Problem: Education: Goal: Will be free of psychotic symptoms Outcome: Progressing Goal: Knowledge of the prescribed therapeutic regimen will improve Outcome: Progressing   Problem: Coping: Goal: Coping ability will improve Outcome: Progressing   Problem: Health Behavior/Discharge Planning: Goal: Compliance with prescribed medication regimen will improve Outcome: Progressing   

## 2024-03-20 NOTE — Group Note (Signed)
 Date:  03/20/2024 Time:  5:42 PM  Group Topic/Focus:  Goals Group:   The focus of this group is to help patients establish daily goals to achieve during treatment and discuss how the patient can incorporate goal setting into their daily lives to aide in recovery. Self Care:   The focus of this group is to help patients understand the importance of self-care in order to improve or restore emotional, physical, spiritual, interpersonal, and financial health. Healthy Eating habits!     Participation Level:  Active  Participation Quality:  Appropriate  Affect:  Appropriate  Cognitive:  Appropriate  Insight: Appropriate  Engagement in Group:  Engaged  Modes of Intervention:  Activity and Discussion  Additional Comments:    Charles Charles L Bralyn Folkert 03/20/2024, 5:42 PM

## 2024-03-20 NOTE — Progress Notes (Signed)
   03/20/24 1000  Psych Admission Type (Psych Patients Only)  Admission Status Voluntary  Psychosocial Assessment  Patient Complaints None  Eye Contact Fair  Facial Expression Flat  Affect Flat  Speech Logical/coherent  Interaction Assertive  Motor Activity Slow  Appearance/Hygiene In scrubs  Behavior Characteristics Appropriate to situation  Mood Pleasant  Thought Process  Coherency WDL  Content WDL  Delusions None reported or observed  Perception WDL  Hallucination None reported or observed  Judgment WDL  Confusion None  Danger to Self  Current suicidal ideation? Denies  Agreement Not to Harm Self Yes  Description of Agreement verbal

## 2024-03-20 NOTE — Plan of Care (Signed)
  Problem: Education: Goal: Will be free of psychotic symptoms Outcome: Progressing Goal: Knowledge of the prescribed therapeutic regimen will improve Outcome: Progressing   Problem: Coping: Goal: Coping ability will improve Outcome: Progressing Goal: Will verbalize feelings Outcome: Progressing   Problem: Health Behavior/Discharge Planning: Goal: Compliance with prescribed medication regimen will improve Outcome: Progressing   Problem: Nutritional: Goal: Ability to achieve adequate nutritional intake will improve Outcome: Progressing   Problem: Safety: Goal: Ability to redirect hostility and anger into socially appropriate behaviors will improve Outcome: Progressing Goal: Ability to remain free from injury will improve Outcome: Progressing   Problem: Self-Care: Goal: Ability to participate in self-care as condition permits will improve Outcome: Progressing   Problem: Education: Goal: Knowledge of Study [place] [facility] Education information/materials will improve Outcome: Progressing Goal: Emotional status will improve Outcome: Progressing Goal: Mental status will improve Outcome: Progressing Goal: Verbalization of understanding the information provided will improve Outcome: Progressing   Problem: Activity: Goal: Interest or engagement in activities will improve Outcome: Progressing Goal: Sleeping patterns will improve Outcome: Progressing   Problem: Coping: Goal: Ability to verbalize frustrations and anger appropriately will improve Outcome: Progressing Goal: Ability to demonstrate self-control will improve Outcome: Progressing   Problem: Health Behavior/Discharge Planning: Goal: Identification of resources available to assist in meeting health care needs will improve Outcome: Progressing Goal: Compliance with treatment plan for underlying cause of condition will improve Outcome: Progressing   Problem: Physical Regulation: Goal: Ability to maintain clinical  measurements within normal limits will improve Outcome: Progressing   Problem: Safety: Goal: Periods of time without injury will increase Outcome: Progressing   Problem: OP Suicidal Ideation Goal: LTG: Placement in appropriate level of care to safely address suicidal or parasuicidal crisis, based on clinical assessment Outcome: Progressing Goal: LTG: Akram will be free from thoughts of self-harm as evidenced by suicide assessment or self-reported score of ideations Outcome: Progressing Goal: STG: Educate Breckon on principles of suicide risk safety plan and levels of interventions Outcome: Progressing Goal: STG: Work with Sandra to create and implement personal suicide risk safety plan Outcome: Progressing

## 2024-03-21 NOTE — Plan of Care (Signed)
   Problem: Coping: Goal: Coping ability will improve Outcome: Progressing Goal: Will verbalize feelings Outcome: Progressing

## 2024-03-21 NOTE — Progress Notes (Signed)
 Pt is A&Ox4. He denies SI/HI/AVH at this time. He is seen attending group in the dayroom. He states that his pain is relieved with Voltaren . He is safe on the unit at this time with Q 15 min safety checks in place.

## 2024-03-21 NOTE — Progress Notes (Signed)
 [facility] MD Progress Note  03/21/2024 12:31 PM Charles Charles  MRN:  [id number]  Charles Charles is a 63 year old male with a past medical history of HIV, CVA, hypothyroidism, COPD, GERD, MDD and alcohol and cocaine use who has been admitted from [facility] after attempting to walk onto oncoming traffic. He states that he hears voices telling him to hurt himself, kill himself, and sometimes to hurt anyone in his way.  He reports hearing male voices, command type.  He reports the voices been happening for a long time.   Subjective: Per nurse report patient is doing well.  Denies any suicidal homicidal ideation or any auditory visual hallucinations.  Not noticed to be responding to internal stimuli.  Patient has been calm cooperative.  Patient interviewed reports that he is doing well.  Patient today reports that he did not had any sobriety.  When this writer brought that during the sobriety.  He reported he was positive for cocaine per our ED chart.  Patient reports that he did not have any sustained sobriety..  At this time he does not want to go to rehab and wants to go home.  Discussed with the patient that unfortunately we did not have outpatient appointment so he was not discharged yesterday.  Patient expressed understanding and reports that he is fine with staying. Sleep: Fair  Appetite:  Good  Past Psychiatric History: see H&P Family History:  Family History  Problem Relation Age of Onset   Anxiety disorder Mother    Diabetes Mother    Diabetes Father    Social History:  Social History   Substance and Sexual Activity  Alcohol Use Yes   Alcohol/week: 56.0 standard drinks of alcohol   Types: 56 Cans of beer per week   Comment: 1 beer 2 xs a week - last drink 4-5 days ago     Social History   Substance and Sexual Activity  Drug Use Yes   Types: Cocaine, Marijuana   Comment: last use 3-4 days ago    Social History   Socioeconomic History   Marital status: Single    Spouse name: Not on  file   Number of children: Not on file   Years of education: Not on file   Highest education level: 8th grade  Occupational History   Not on file  Tobacco Use   Smoking status: Every Day    Current packs/day: 1.00    Average packs/day: 1 pack/day for 0.7 years (0.7 ttl pk-yrs)    Types: Cigarettes    Start date: 2025   Smokeless tobacco: Never   Tobacco comments:    (1-2 cigs/day)  Vaping Use   Vaping status: Never Used  Substance and Sexual Activity   Alcohol use: Yes    Alcohol/week: 56.0 standard drinks of alcohol    Types: 56 Cans of beer per week    Comment: 1 beer 2 xs a week - last drink 4-5 days ago   Drug use: Yes    Types: Cocaine, Marijuana    Comment: last use 3-4 days ago   Sexual activity: Not Currently    Partners: Female  Other Topics Concern   Not on file  Social History Narrative   Not on file   Social Drivers of Health   Financial Resource Strain: High Risk (11/06/2022)   Received from [facility]   Overall Financial Resource Strain (CARDIA)    Difficulty of Paying Living Expenses: Hard  Food Insecurity: No Food Insecurity (  03/11/2024)   Hunger Vital Sign    Worried About Running Out of Food in the Last Year: Never true    Ran Out of Food in the Last Year: Never true  Transportation Needs: Unmet Transportation Needs (03/11/2024)   PRAPARE - Administrator, Civil Service (Medical): Yes    Lack of Transportation (Non-Medical): Yes  Physical Activity: Not on file  Stress: Not on file  Social Connections: Not on file   Past Medical History:  Past Medical History:  Diagnosis Date   Alcohol abuse 01/18/2021   Alcohol use disorder, moderate, dependence (HCC) 04/10/2016   Cannabis use disorder, moderate, dependence (HCC) 05/06/2016   COPD (chronic obstructive pulmonary disease) (HCC) 12/03/2022   COPD exacerbation (HCC) 12/03/2022   Drug overdose 01/05/2022   GERD (gastroesophageal reflux disease)    Headache    History of syphilis  12/03/2022   Neutropenia (HCC) 12/03/2022   Schizo-affective schizophrenia (HCC)    Suicidal ideation 04/09/2016    Past Surgical History:  Procedure Laterality Date   COLONOSCOPY WITH PROPOFOL  N/A 08/04/2015   Procedure: COLONOSCOPY WITH PROPOFOL ;  Surgeon: Rogelia Copping, MD;  Location: [facility];  Service: Endoscopy;  Laterality: N/A;   INCISION AND DRAINAGE PERIRECTAL ABSCESS N/A 07/14/2015   Procedure: IRRIGATION AND DEBRIDEMENT PERIRECTAL ABSCESS;  Surgeon: Charlie FORBES Fell, MD;  Location: [facility];  Service: General;  Laterality: N/A;   INCISION AND DRAINAGE PERIRECTAL ABSCESS N/A 08/14/2015   Procedure: IRRIGATION AND DEBRIDEMENT PERIRECTAL ABSCESS;  Surgeon: Dorothyann LITTIE Husk, MD;  Location: [facility];  Service: General;  Laterality: N/A;    Current Medications: Current Facility-Administered Medications  Medication Dose Route Frequency Provider Last Rate Last Admin   acetaminophen  (TYLENOL ) tablet 650 mg  650 mg Oral Q6H PRN Brent, Amanda C, NP   650 mg at 03/12/24 1400   albuterol  (VENTOLIN  HFA) 108 (90 Base) MCG/ACT inhaler 2 puff  2 puff Inhalation Q6H PRN Brent, Amanda C, NP       alum & mag hydroxide-simeth (MAALOX/MYLANTA) 200-200-20 MG/5ML suspension 30 mL  30 mL Oral Q4H PRN Brent, Amanda C, NP       diclofenac  Sodium (VOLTAREN ) 1 % topical gel 2 g  2 g Topical QID Jadapalle, Sree, MD   2 g at 03/21/24 1004   diphenhydrAMINE  (BENADRYL ) capsule 25 mg  25 mg Oral Q6H PRN Jadapalle, Sree, MD   25 mg at 03/21/24 1005   doravirin-lamivudin-tenofov df (DELSTRIGO ) 100-300-300 MG per tablet 1 tablet  1 tablet Oral Daily Jadapalle, Sree, MD   1 tablet at 03/21/24 1004   hydrOXYzine  (ATARAX ) tablet 25 mg  25 mg Oral TID PRN Brent, Amanda C, NP       magnesium  hydroxide (MILK OF MAGNESIA) suspension 30 mL  30 mL Oral Daily PRN Brent, Amanda C, NP       OLANZapine  (ZYPREXA ) injection 5 mg  5 mg Intramuscular TID PRN Brent, Amanda C, NP       OLANZapine  zydis (ZYPREXA )  disintegrating tablet 5 mg  5 mg Oral TID PRN Brent, Amanda C, NP       pantoprazole  (PROTONIX ) EC tablet 20 mg  20 mg Oral Daily Brent, Amanda C, NP   20 mg at 03/21/24 1005   risperiDONE  (RISPERDAL ) tablet 3 mg  3 mg Oral BID Jadapalle, Sree, MD   3 mg at 03/21/24 1004   sertraline  (ZOLOFT ) tablet 100 mg  100 mg Oral Daily Jadapalle, Sree, MD   100 mg at 03/21/24 1005  Lab Results:  No results found for this or any previous visit (from the past 48 hours).   Blood Alcohol level:  Lab Results  Component Value Date   ETH <15 03/11/2024   ETH 185 (H) 11/10/2023    Metabolic Disorder Labs: Lab Results  Component Value Date   HGBA1C 5.7 (H) 03/11/2024   MPG 116.89 03/11/2024   MPG 137 12/02/2022   Lab Results  Component Value Date   PROLACTIN 61.5 (H) 06/05/2016   PROLACTIN 18.8 (H) 06/04/2016   Lab Results  Component Value Date   CHOL 166 03/11/2024   TRIG 102 03/11/2024   HDL 48 03/11/2024   CHOLHDL 3.5 03/11/2024   VLDL 20 03/11/2024   LDLCALC 98 03/11/2024   LDLCALC 91 02/24/2024    Physical Findings: AIMS:  , ,  ,  ,    CIWA:    COWS:      Psychiatric Specialty Exam:  Presentation  General Appearance:  Appropriate for Environment; Casual  Eye Contact: Fair  Speech: Clear and Coherent  Speech Volume: Normal    Mood and Affect  Mood: fine  Affect: stable   Thought Process  Thought Processes: Coherent  Descriptions of Associations:Intact  Orientation:Full (Time, Place and Person)  Thought Content:Logical  Hallucinations: Denies Ideas of Reference:None  Suicidal Thoughts: Denies Homicidal Thoughts: Denies  Sensorium  Memory: Immediate Fair; Recent Fair; Remote Fair  Judgment: Fair  Insight: Fair   Art therapist  Concentration: Fair  Attention Span: Fair  Recall: [employer] of Knowledge: Fair  Language: Fair   Psychomotor Activity  Psychomotor Activity:No data recorded  Musculoskeletal: Strength  & Muscle Tone: within normal limits Gait & Station: normal Assets  Assets: Manufacturing systems engineer; Desire for Improvement; Social Support    Physical Exam: Physical Exam Vitals and nursing note reviewed.  Constitutional:      Appearance: Normal appearance. He is normal weight.  HENT:     Head: Normocephalic.     Nose: Nose normal.     Mouth/Throat:     Mouth: Mucous membranes are moist.  Eyes:     Conjunctiva/sclera: Conjunctivae normal.  Pulmonary:     Effort: Pulmonary effort is normal.  Musculoskeletal:        General: Normal range of motion.     Cervical back: Normal range of motion.  Neurological:     Mental Status: He is alert and oriented to person, place, and time.  Psychiatric:        Mood and Affect: Mood normal.    Review of Systems  Constitutional:  Negative for chills, fever, malaise/fatigue and weight loss.  Musculoskeletal:  Positive for joint pain.  Psychiatric/Behavioral:  Positive for hallucinations. Negative for suicidal ideas.    Blood pressure (!) 93/53, pulse 74, temperature 98.2 F (36.8 C), resp. rate 17, height 5' 6 (1.676 m), weight 73 kg, SpO2 100%. Body mass index is 25.98 kg/m.  Diagnosis: Principal Problem:   Schizoaffective disorder (HCC) Active Problems:   Substance-induced psychotic disorder with hallucinations (HCC)   Clinical Decision Making: Patient currently admitted to inpatient unit for worsening hallucinations and depression in the context of multiple psychosocial stressors including being homeless, substance use, relapse on crack cocaine.  Patient will be monitored closely for medication adjustments.   Treatment Plan Summary:   Safety and Monitoring:             -- Voluntary admission to inpatient psychiatric unit for safety, stabilization and treatment             --  Daily contact with patient to assess and evaluate symptoms and progress in treatment             -- Patient's case to be discussed in multi-disciplinary team  meeting             -- Observation Level: q15 minute checks             -- Vital signs:  q12 hours             -- Precautions: suicide, elopement, and assault   2. Psychiatric Diagnoses and Treatment:   --Continue Sertraline  100mg  daily -- Risperidone  3mg  twice daily  3. Medical Issues Being Addressed: -- Left elbow Xray normal, continue Voltaren  gel prn pain.    HIV continue anti-retroviral therapy.  GERD  continue Protonix .   COPD/ shortness of breath  albuterol  as needed  4. Discharge Planning:   -- Social work and case management to assist with discharge planning and identification of hospital follow-up needs prior to discharge  -- Estimated LOS: 3-4 days  Desmond Chimera, MD 03/21/2024, 12:31 PM

## 2024-03-21 NOTE — Group Note (Signed)
 LCSW Group Therapy Note  Group Date: 03/21/2024 Start Time: 1300 End Time: 1340   Type of Therapy and Topic:  Group Therapy - Healthy vs Unhealthy Coping Skills  Participation Level:  None   Description of Group The focus of this group was to determine what unhealthy coping techniques typically are used by group members and what healthy coping techniques would be helpful in coping with various problems. Patients were guided in becoming aware of the differences between healthy and unhealthy coping techniques. Patients were asked to identify 2-3 healthy coping skills they would like to learn to use more effectively.  Therapeutic Goals Patients learned that coping is what human beings do all day long to deal with various situations in their lives Patients defined and discussed healthy vs unhealthy coping techniques Patients identified their preferred coping techniques and identified whether these were healthy or unhealthy Patients determined 2-3 healthy coping skills they would like to become more familiar with and use more often. Patients provided support and ideas to each other   Summary of Patient Progress:  Patient attended group. Patient proved open to input from peers and feedback from CSW. Patient was respectful of peers.  Therapeutic Modalities Cognitive Behavioral Therapy Motivational Interviewing  Charles Charles, LCSWA 03/21/2024  4:31 PM

## 2024-03-21 NOTE — Group Note (Signed)
 Date:  03/21/2024 Time:  11:26 AM  Group Topic/Focus:  Healthy Communication:   The focus of this group is to discuss communication, barriers to communication, as well as healthy ways to communicate with others.    Participation Level:  Active  Participation Quality:  Appropriate  Affect:  Appropriate  Cognitive:  Appropriate  Insight: Appropriate  Engagement in Group:  Engaged  Modes of Intervention:  Activity  Additional Comments:    Rajvir Ernster 03/21/2024, 11:26 AM

## 2024-03-21 NOTE — Group Note (Signed)
 Date:  03/21/2024 Time:  9:37 PM  Group Topic/Focus:  Wrap-Up Group:   The focus of this group is to help patients review their daily goal of treatment and discuss progress on daily workbooks.    Participation Level:  Active  Participation Quality:  Appropriate  Affect:  Appropriate  Cognitive:  Alert  Insight: Appropriate  Engagement in Group:  Engaged  Modes of Intervention:  Discussion  Additional Comments:    Charles Charles CHRISTELLA Bunker 03/21/2024, 9:37 PM

## 2024-03-22 DIAGNOSIS — F19951 Other psychoactive substance use, unspecified with psychoactive substance-induced psychotic disorder with hallucinations: Secondary | ICD-10-CM | POA: Diagnosis not present

## 2024-03-22 DIAGNOSIS — F259 Schizoaffective disorder, unspecified: Secondary | ICD-10-CM | POA: Diagnosis not present

## 2024-03-22 NOTE — Group Note (Signed)
 Recreation Therapy Group Note   Group Topic:Other  Group Date: 03/22/2024 Start Time: 1400 End Time: 1445 Facilitators: Celestia Jeoffrey BRAVO, LRT, CTRS Location: Dayroom  Activity Description/Intervention: Therapeutic Drumming. Patients with peers and staff were given the opportunity to engage in a leader facilitated [facility] Group Empowerment Drumming Circle with staff from the [employer], in partnership with [employer]. Teaching laboratory technician and trained [employer], Norleen Mon leading with LRT observing and documenting intervention and pt response. This evidenced-based practice targets 7 areas of health and wellbeing in the human experience including: stress-reduction, exercise, self-expression, camaraderie/support, nurturing, spirituality, and music-making (leisure).    Goal Area(s) Addresses:  Patient will engage in pro-social way in music group.  Patient will follow directions of drum leader on the first prompt. Patient will demonstrate no behavioral issues during group.  Patient will identify if a reduction in stress level occurs as a result of participation in therapeutic drum circle.      Affect/Mood: N/A   Participation Level: Did not attend    Clinical Observations/Individualized Feedback: Patient did not attend group.   Plan: Continue to engage patient in RT group sessions 2-3x/week.   Jeoffrey BRAVO Celestia, LRT, CTRS 03/22/2024 4:22 PM

## 2024-03-22 NOTE — [facility] Suicide Risk Assessment (Signed)
 [facility] Discharge Suicide Risk Assessment   Principal Problem: Schizoaffective disorder [facility]) Discharge Diagnoses: Principal Problem:   Schizoaffective disorder (HCC) Active Problems:   Substance-induced psychotic disorder with hallucinations (HCC)   Total Time spent with patient: 30 minutes  Musculoskeletal: Strength & Muscle Tone: within normal limits Gait & Station: normal Patient leans: N/A  Psychiatric Specialty Exam  Presentation  General Appearance:  Appropriate for Environment; Casual  Eye Contact: Fair  Speech: Clear and Coherent  Speech Volume: Normal  Handedness: Right   Mood and Affect  Mood: Euthymic  Duration of Depression Symptoms: Greater than two weeks  Affect: Appropriate   Thought Process  Thought Processes: Coherent  Descriptions of Associations:Intact  Orientation:Full (Time, Place and Person)  Thought Content:Logical  History of Schizophrenia/Schizoaffective disorder:No  Duration of Psychotic Symptoms:Greater than six months  Hallucinations:No data recorded Ideas of Reference:None  Suicidal Thoughts:No data recorded Homicidal Thoughts:No data recorded  Sensorium  Memory: Immediate Fair; Recent Fair; Remote Fair  Judgment: Fair  Insight: Fair   Art therapist  Concentration: Fair  Attention Span: Fair  Recall: [employer] of Knowledge: Fair  Language: Fair   Psychomotor Activity  Psychomotor Activity:No data recorded  Assets  Assets: Communication Skills; Desire for Improvement; Social Support   Sleep  Sleep:No data recorded Estimated Sleeping Duration (Last 24 Hours): 8.00-9.50 hours  Physical Exam: Physical Exam Vitals and nursing note reviewed.    ROS Blood pressure 102/66, pulse 71, temperature 97.9 F (36.6 C), temperature source Temporal, resp. rate 14, height 5' 6 (1.676 m), weight 73 kg, SpO2 100%. Body mass index is 25.98 kg/m.  Mental Status Per Nursing Assessment::    On Admission:  Suicidal ideation indicated by patient  Demographic Factors:  Low socioeconomic status  Loss Factors: Decrease in vocational status  Historical Factors: Impulsivity  Risk Reduction Factors:   Positive social support, Positive therapeutic relationship, and Positive coping skills or problem solving skills  Continued Clinical Symptoms:  Depression:   Impulsivity  Cognitive Features That Contribute To Risk:  None    Suicide Risk:  Minimal: No identifiable suicidal ideation.  Patients presenting with no risk factors but with morbid ruminations; may be classified as minimal risk based on the severity of the depressive symptoms    Plan Of Care/Follow-up recommendations:  Activity:  as tolerated  Allyn Foil, MD 03/22/2024, 6:58 PM

## 2024-03-22 NOTE — Group Note (Signed)
 Date:  03/22/2024 Time:  4:34 PM  Group Topic/Focus:  Coping With Mental Health Crisis:   The purpose of this group is to help patients identify strategies for coping with mental health crisis.  Group discusses possible causes of crisis and ways to manage them effectively.    Participation Level:  Active  Participation Quality:  Attentive  Affect:  Appropriate  Cognitive:  Appropriate  Insight: Appropriate  Engagement in Group:  None  Modes of Intervention:  Activity  Additional Comments:    Arless Vineyard 03/22/2024, 4:34 PM

## 2024-03-22 NOTE — Progress Notes (Signed)
 [facility] MD Progress Note  03/22/2024 6:55 PM Charles Charles  MRN:  [id number]  Charles Charles is a 63 year old male with a past medical history of HIV, CVA, hypothyroidism, COPD, GERD, MDD and alcohol and cocaine use who has been admitted from [facility] after attempting to walk onto oncoming traffic. He states that he hears voices telling him to hurt himself, kill himself, and sometimes to hurt anyone in his way.  He reports hearing male voices, command type.  He reports the voices been happening for a long time.   Subjective:  Chart reviewed, case discussed in multidisciplinary meeting, patient seen during rounds.  Patient is noted to be resting in his bed.  He offers no complaints.  Patient and provider discussed about discharge planning as he had no appointment set for follow-up after discharge.  Provider did inform the patient that his discharge is set for Tuesday after all the follow-up appointments are made.  Patient expressed his understanding.  Patient continues to report improved mood and anxiety.  He denies any SI/HI/plan and denies auditory/visual hallucinations.  He reports fair appetite and sleep.  Per nursing report patient is participating in groups with no noted behavioral problems on the unit.   Sleep: Fair  Appetite:  Good  Past Psychiatric History: see H&P Family History:  Family History  Problem Relation Age of Onset   Anxiety disorder Mother    Diabetes Mother    Diabetes Father    Social History:  Social History   Substance and Sexual Activity  Alcohol Use Yes   Alcohol/week: 56.0 standard drinks of alcohol   Types: 56 Cans of beer per week   Comment: 1 beer 2 xs a week - last drink 4-5 days ago     Social History   Substance and Sexual Activity  Drug Use Yes   Types: Cocaine, Marijuana   Comment: last use 3-4 days ago    Social History   Socioeconomic History   Marital status: Single    Spouse name: Not on file   Number of children: Not on file   Years of  education: Not on file   Highest education level: 8th grade  Occupational History   Not on file  Tobacco Use   Smoking status: Every Day    Current packs/day: 1.00    Average packs/day: 1 pack/day for 0.7 years (0.7 ttl pk-yrs)    Types: Cigarettes    Start date: 2025   Smokeless tobacco: Never   Tobacco comments:    (1-2 cigs/day)  Vaping Use   Vaping status: Never Used  Substance and Sexual Activity   Alcohol use: Yes    Alcohol/week: 56.0 standard drinks of alcohol    Types: 56 Cans of beer per week    Comment: 1 beer 2 xs a week - last drink 4-5 days ago   Drug use: Yes    Types: Cocaine, Marijuana    Comment: last use 3-4 days ago   Sexual activity: Not Currently    Partners: Female  Other Topics Concern   Not on file  Social History Narrative   Not on file   Social Drivers of Health   Financial Resource Strain: High Risk (11/06/2022)   [facility]   Overall Financial Resource Strain (CARDIA)    Difficulty of Paying Living Expenses: Hard  Food Insecurity: No Food Insecurity (03/11/2024)   Hunger Vital Sign    Worried About Running Out of Food in the Last Year:  Never true    Ran Out of Food in the Last Year: Never true  Transportation Needs: Unmet Transportation Needs (03/11/2024)   PRAPARE - Administrator, Civil Service (Medical): Yes    Lack of Transportation (Non-Medical): Yes  Physical Activity: Not on file  Stress: Not on file  Social Connections: Not on file   Past Medical History:  Past Medical History:  Diagnosis Date   Alcohol abuse 01/18/2021   Alcohol use disorder, moderate, dependence (HCC) 04/10/2016   Cannabis use disorder, moderate, dependence (HCC) 05/06/2016   COPD (chronic obstructive pulmonary disease) (HCC) 12/03/2022   COPD exacerbation (HCC) 12/03/2022   Drug overdose 01/05/2022   GERD (gastroesophageal reflux disease)    Headache    History of syphilis 12/03/2022   Neutropenia 12/03/2022    Schizo-affective schizophrenia (HCC)    Suicidal ideation 04/09/2016    Past Surgical History:  Procedure Laterality Date   COLONOSCOPY WITH PROPOFOL  N/A 08/04/2015   Procedure: COLONOSCOPY WITH PROPOFOL ;  Surgeon: Rogelia Copping, MD;  [facility]: [facility];  Service: Endoscopy;  Laterality: N/A;   INCISION AND DRAINAGE PERIRECTAL ABSCESS N/A 07/14/2015   Procedure: IRRIGATION AND DEBRIDEMENT PERIRECTAL ABSCESS;  Surgeon: Charlie FORBES Fell, MD;  Location: [facility];  Service: General;  Laterality: N/A;   INCISION AND DRAINAGE PERIRECTAL ABSCESS N/A 08/14/2015   Procedure: IRRIGATION AND DEBRIDEMENT PERIRECTAL ABSCESS;  Surgeon: Dorothyann LITTIE Husk, MD;  Location: [facility];  Service: General;  Laterality: N/A;    Current Medications: Current Facility-Administered Medications  Medication Dose Route Frequency Provider Last Rate Last Admin   acetaminophen  (TYLENOL ) tablet 650 mg  650 mg Oral Q6H PRN Brent, Amanda C, NP   650 mg at 03/22/24 1035   albuterol  (VENTOLIN  HFA) 108 (90 Base) MCG/ACT inhaler 2 puff  2 puff Inhalation Q6H PRN Brent, Amanda C, NP       alum & mag hydroxide-simeth (MAALOX/MYLANTA) 200-200-20 MG/5ML suspension 30 mL  30 mL Oral Q4H PRN Brent, Amanda C, NP       diclofenac  Sodium (VOLTAREN ) 1 % topical gel 2 g  2 g Topical QID Donnelly Mellow, MD   2 g at 03/22/24 0940   diphenhydrAMINE  (BENADRYL ) capsule 25 mg  25 mg Oral Q6H PRN Keyuana Wank, MD   25 mg at 03/22/24 1036   doravirin-lamivudin-tenofov df (DELSTRIGO ) 100-300-300 MG per tablet 1 tablet  1 tablet Oral Daily Madolyn Ackroyd, MD   1 tablet at 03/22/24 1003   hydrOXYzine  (ATARAX ) tablet 25 mg  25 mg Oral TID PRN Brent, Amanda C, NP       magnesium  hydroxide (MILK OF MAGNESIA) suspension 30 mL  30 mL Oral Daily PRN Brent, Amanda C, NP       OLANZapine  (ZYPREXA ) injection 5 mg  5 mg Intramuscular TID PRN Brent, Amanda C, NP       OLANZapine  zydis (ZYPREXA ) disintegrating tablet 5 mg  5 mg Oral TID PRN Brent, Amanda  C, NP       pantoprazole  (PROTONIX ) EC tablet 20 mg  20 mg Oral Daily Brent, Amanda C, NP   20 mg at 03/22/24 9060   risperiDONE  (RISPERDAL ) tablet 3 mg  3 mg Oral BID Mithran Strike, MD   3 mg at 03/22/24 9063   sertraline  (ZOLOFT ) tablet 100 mg  100 mg Oral Daily Randall Rampersad, MD   100 mg at 03/22/24 9062    Lab Results:  No results found for this or any previous visit (from the past 48 hours).  Blood Alcohol level:  Lab Results  Component Value Date   ETH <15 03/11/2024   ETH 185 (H) 11/10/2023    Metabolic Disorder Labs: Lab Results  Component Value Date   HGBA1C 5.7 (H) 03/11/2024   MPG 116.89 03/11/2024   MPG 137 12/02/2022   Lab Results  Component Value Date   PROLACTIN 61.5 (H) 06/05/2016   PROLACTIN 18.8 (H) 06/04/2016   Lab Results  Component Value Date   CHOL 166 03/11/2024   TRIG 102 03/11/2024   HDL 48 03/11/2024   CHOLHDL 3.5 03/11/2024   VLDL 20 03/11/2024   LDLCALC 98 03/11/2024   LDLCALC 91 02/24/2024    Physical Findings: AIMS:  , ,  ,  ,    CIWA:    COWS:      Psychiatric Specialty Exam:  Presentation  General Appearance:  Appropriate for Environment; Casual  Eye Contact: Fair  Speech: Clear and Coherent  Speech Volume: Normal    Mood and Affect  Mood: fine  Affect: stable   Thought Process  Thought Processes: Coherent  Descriptions of Associations:Intact  Orientation:Full (Time, Place and Person)  Thought Content:Logical  Hallucinations: Denies Ideas of Reference:None  Suicidal Thoughts: Denies Homicidal Thoughts: Denies  Sensorium  Memory: Immediate Fair; Recent Fair; Remote Fair  Judgment: Fair  Insight: Fair   Art therapist  Concentration: Fair  Attention Span: Fair  Recall: [employer] of Knowledge: Fair  Language: Fair   Psychomotor Activity  Psychomotor Activity:No data recorded Musculoskeletal: Strength & Muscle Tone: within normal limits Gait & Station:  normal Assets  Assets: Manufacturing systems engineer; Desire for Improvement; Social Support    Physical Exam: Physical Exam Vitals and nursing note reviewed.  Constitutional:      Appearance: Normal appearance. He is normal weight.  HENT:     Head: Normocephalic.     Nose: Nose normal.     Mouth/Throat:     Mouth: Mucous membranes are moist.  Eyes:     Conjunctiva/sclera: Conjunctivae normal.  Pulmonary:     Effort: Pulmonary effort is normal.  Musculoskeletal:        General: Signs of injury present. Normal range of motion.     Cervical back: Normal range of motion.  Neurological:     Mental Status: He is alert and oriented to person, place, and time.  Psychiatric:        Mood and Affect: Mood normal.    Review of Systems  Constitutional:  Negative for chills, fever, malaise/fatigue and weight loss.  Musculoskeletal:  Positive for joint pain.  Psychiatric/Behavioral:  Positive for hallucinations. Negative for suicidal ideas.    Blood pressure 102/66, pulse 71, temperature 97.9 F (36.6 C), temperature source Temporal, resp. rate 14, height 5' 6 (1.676 m), weight 73 kg, SpO2 100%. Body mass index is 25.98 kg/m.  Diagnosis: Principal Problem:   Schizoaffective disorder (HCC) Active Problems:   Substance-induced psychotic disorder with hallucinations (HCC)   Clinical Decision Making: Patient currently admitted to inpatient unit for worsening hallucinations and depression in the context of multiple psychosocial stressors including being homeless, substance use, relapse on crack cocaine.  Patient will be monitored closely for medication adjustments.   Treatment Plan Summary:   Safety and Monitoring:             -- Voluntary admission to inpatient psychiatric unit for safety, stabilization and treatment             -- Daily contact with patient to assess and evaluate symptoms  and progress in treatment             -- Patient's case to be discussed in multi-disciplinary team  meeting             -- Observation Level: q15 minute checks             -- Vital signs:  q12 hours             -- Precautions: suicide, elopement, and assault   2. Psychiatric Diagnoses and Treatment:   --Continue Sertraline  100mg  daily -- Risperidone  3mg  twice daily  3. Medical Issues Being Addressed: -- Left elbow Xray normal, continue Voltaren  gel prn pain.   4. Discharge Planning:   -- Social work and case management to assist with discharge planning and identification of hospital follow-up needs prior to discharge  -- Estimated LOS: 3-4 days  Lakeeta Dobosz, MD 03/22/2024, 6:55 PM

## 2024-03-22 NOTE — Progress Notes (Signed)
   03/21/24 2200  Psych Admission Type (Psych Patients Only)  Admission Status Voluntary  Psychosocial Assessment  Patient Complaints None  Eye Contact Fair  Facial Expression Flat  Affect Flat  Speech Logical/coherent  Interaction Assertive  Motor Activity Restless  Appearance/Hygiene In scrubs  Behavior Characteristics Appropriate to situation  Mood Pleasant  Thought Process  Coherency WDL  Content WDL  Delusions None reported or observed  Perception WDL  Hallucination None reported or observed  Judgment WDL  Confusion None  Danger to Self  Current suicidal ideation? Denies  Agreement Not to Harm Self Yes  Description of Agreement Verbal  Danger to Others  Danger to Others None reported or observed

## 2024-03-22 NOTE — Plan of Care (Signed)
  Problem: Education: Goal: Will be free of psychotic symptoms Outcome: Adequate for Discharge Goal: Knowledge of the prescribed therapeutic regimen will improve Outcome: Adequate for Discharge   Problem: Coping: Goal: Coping ability will improve Outcome: Adequate for Discharge Goal: Will verbalize feelings Outcome: Adequate for Discharge   Problem: Health Behavior/Discharge Planning: Goal: Compliance with prescribed medication regimen will improve Outcome: Adequate for Discharge   Problem: Nutritional: Goal: Ability to achieve adequate nutritional intake will improve Outcome: Adequate for Discharge   Problem: Safety: Goal: Ability to redirect hostility and anger into socially appropriate behaviors will improve Outcome: Adequate for Discharge Goal: Ability to remain free from injury will improve Outcome: Adequate for Discharge   Problem: Self-Care: Goal: Ability to participate in self-care as condition permits will improve Outcome: Adequate for Discharge   Problem: Education: Goal: Knowledge of [place] [facility] Education information/materials will improve Outcome: Adequate for Discharge Goal: Emotional status will improve Outcome: Adequate for Discharge Goal: Mental status will improve Outcome: Adequate for Discharge Goal: Verbalization of understanding the information provided will improve Outcome: Adequate for Discharge   Problem: Activity: Goal: Interest or engagement in activities will improve Outcome: Adequate for Discharge Goal: Sleeping patterns will improve Outcome: Adequate for Discharge   Problem: Coping: Goal: Ability to verbalize frustrations and anger appropriately will improve Outcome: Adequate for Discharge Goal: Ability to demonstrate self-control will improve Outcome: Adequate for Discharge   Problem: Health Behavior/Discharge Planning: Goal: Identification of resources available to assist in meeting health care needs will improve Outcome:  Adequate for Discharge Goal: Compliance with treatment plan for underlying cause of condition will improve Outcome: Adequate for Discharge   Problem: Physical Regulation: Goal: Ability to maintain clinical measurements within normal limits will improve Outcome: Adequate for Discharge   Problem: Safety: Goal: Periods of time without injury will increase Outcome: Adequate for Discharge   Problem: OP Suicidal Ideation Goal: LTG: Placement in appropriate level of care to safely address suicidal or parasuicidal crisis, based on clinical assessment Outcome: Adequate for Discharge Goal: LTG: Kaleel will be free from thoughts of self-harm as evidenced by suicide assessment or self-reported score of ideations Outcome: Adequate for Discharge Goal: STG: Educate Bob on principles of suicide risk safety plan and levels of interventions Outcome: Adequate for Discharge Goal: STG: Work with Sandra to create and implement personal suicide risk safety plan Outcome: Adequate for Discharge

## 2024-03-22 NOTE — BH IP Treatment Plan (Signed)
 Interdisciplinary Treatment and Diagnostic Plan Update  03/22/2024 Time of Session: 9:00 AM Charles Charles MRN: [id number]  Principal Diagnosis: Schizoaffective disorder [facility])  Secondary Diagnoses: Principal Problem:   Schizoaffective disorder (HCC) Active Problems:   Substance-induced psychotic disorder with hallucinations (HCC)   Current Medications:  Current Facility-Administered Medications  Medication Dose Route Frequency Provider Last Rate Last Admin   acetaminophen  (TYLENOL ) tablet 650 mg  650 mg Oral Q6H PRN Brent, Amanda C, NP   650 mg at 03/22/24 1035   albuterol  (VENTOLIN  HFA) 108 (90 Base) MCG/ACT inhaler 2 puff  2 puff Inhalation Q6H PRN Brent, Amanda C, NP       alum & mag hydroxide-simeth (MAALOX/MYLANTA) 200-200-20 MG/5ML suspension 30 mL  30 mL Oral Q4H PRN Brent, Amanda C, NP       diclofenac  Sodium (VOLTAREN ) 1 % topical gel 2 g  2 g Topical QID Jadapalle, Sree, MD   2 g at 03/22/24 0940   diphenhydrAMINE  (BENADRYL ) capsule 25 mg  25 mg Oral Q6H PRN Jadapalle, Sree, MD   25 mg at 03/22/24 1036   doravirin-lamivudin-tenofov df (DELSTRIGO ) 100-300-300 MG per tablet 1 tablet  1 tablet Oral Daily Jadapalle, Sree, MD   1 tablet at 03/22/24 1003   hydrOXYzine  (ATARAX ) tablet 25 mg  25 mg Oral TID PRN Brent, Amanda C, NP       magnesium  hydroxide (MILK OF MAGNESIA) suspension 30 mL  30 mL Oral Daily PRN Brent, Amanda C, NP       OLANZapine  (ZYPREXA ) injection 5 mg  5 mg Intramuscular TID PRN Brent, Amanda C, NP       OLANZapine  zydis (ZYPREXA ) disintegrating tablet 5 mg  5 mg Oral TID PRN Brent, Amanda C, NP       pantoprazole  (PROTONIX ) EC tablet 20 mg  20 mg Oral Daily Brent, Amanda C, NP   20 mg at 03/22/24 9060   risperiDONE  (RISPERDAL ) tablet 3 mg  3 mg Oral BID Jadapalle, Sree, MD   3 mg at 03/22/24 9063   sertraline  (ZOLOFT ) tablet 100 mg  100 mg Oral Daily Jadapalle, Sree, MD   100 mg at 03/22/24 9062   PTA Medications: Medications Prior to Admission  Medication Sig  Dispense Refill Last Dose/Taking   albuterol  (VENTOLIN  HFA) 108 (90 Base) MCG/ACT inhaler Inhale 2 puffs into the lungs every 6 (six) hours as needed for wheezing or shortness of breath.      doravirin-lamivudin-tenofov df (DELSTRIGO ) 100-300-300 MG TABS per tablet Take 1 tablet by mouth daily. 30 tablet 3    hydrOXYzine  (ATARAX ) 25 MG tablet Take 1 tablet (25 mg total) by mouth 3 (three) times daily as needed for anxiety. (Patient taking differently: Take 25 mg by mouth 3 (three) times daily.) 30 tablet 0    OLANZapine  (ZYPREXA ) 15 MG tablet Take 1 tablet (15 mg total) by mouth at bedtime. 30 tablet 0    pantoprazole  (PROTONIX ) 20 MG tablet Take 1 tablet (20 mg total) by mouth daily. 30 tablet 0    sertraline  (ZOLOFT ) 50 MG tablet Take 1 tablet (50 mg total) by mouth daily. 30 tablet 0     Patient Stressors: Financial difficulties   Loss of close relationship with his mother and children.   Medication change or noncompliance   Substance abuse    Patient Strengths: Ability for insight  Communication skills   Treatment Modalities: Medication Management, Group therapy, Case management,  1 to 1 session with clinician, Psychoeducation, Recreational therapy.   Physician Treatment  Plan for Primary Diagnosis: Schizoaffective disorder (HCC) Long Term Goal(s): Improvement in symptoms so as ready for discharge   Short Term Goals: Ability to identify changes in lifestyle to reduce recurrence of condition will improve Ability to verbalize feelings will improve Ability to disclose and discuss suicidal ideas Ability to demonstrate self-control will improve Ability to identify and develop effective coping behaviors will improve Ability to maintain clinical measurements within normal limits will improve Compliance with prescribed medications will improve Ability to identify triggers associated with substance abuse/mental health issues will improve  Medication Management: Evaluate patient's response,  side effects, and tolerance of medication regimen.  Therapeutic Interventions: 1 to 1 sessions, Unit Group sessions and Medication administration.  Evaluation of Outcomes: Progressing  Physician Treatment Plan for Secondary Diagnosis: Principal Problem:   Schizoaffective disorder (HCC) Active Problems:   Substance-induced psychotic disorder with hallucinations (HCC)  Long Term Goal(s): Improvement in symptoms so as ready for discharge   Short Term Goals: Ability to identify changes in lifestyle to reduce recurrence of condition will improve Ability to verbalize feelings will improve Ability to disclose and discuss suicidal ideas Ability to demonstrate self-control will improve Ability to identify and develop effective coping behaviors will improve Ability to maintain clinical measurements within normal limits will improve Compliance with prescribed medications will improve Ability to identify triggers associated with substance abuse/mental health issues will improve     Medication Management: Evaluate patient's response, side effects, and tolerance of medication regimen.  Therapeutic Interventions: 1 to 1 sessions, Unit Group sessions and Medication administration.  Evaluation of Outcomes: Progressing   RN Treatment Plan for Primary Diagnosis: Schizoaffective disorder (HCC) Long Term Goal(s): Knowledge of disease and therapeutic regimen to maintain health will improve  Short Term Goals:  Ability to identify changes in lifestyle to reduce recurrence of condition will improve Ability to verbalize feelings will improve Ability to disclose and discuss suicidal ideas Ability to demonstrate self-control will improve Ability to identify and develop effective coping behaviors will improve Ability to maintain clinical measurements within normal limits will improve Compliance with prescribed medications will improve Ability to identify triggers associated with substance abuse/mental  health issues will improve     Medication Management: RN will administer medications as ordered by provider, will assess and evaluate patient's response and provide education to patient for prescribed medication. RN will report any adverse and/or side effects to prescribing provider.  Therapeutic Interventions: 1 on 1 counseling sessions, Psychoeducation, Medication administration, Evaluate responses to treatment, Monitor vital signs and CBGs as ordered, Perform/monitor CIWA, COWS, AIMS and Fall Risk screenings as ordered, Perform wound care treatments as ordered.  Evaluation of Outcomes: Progressing   LCSW Treatment Plan for Primary Diagnosis: Schizoaffective disorder (HCC) Long Term Goal(s): Safe transition to appropriate next level of care at discharge, Engage patient in therapeutic group addressing interpersonal concerns.  Short Term Goals: Engage patient in aftercare planning with referrals and resources, Increase social support, Increase ability to appropriately verbalize feelings, Increase emotional regulation, Facilitate acceptance of mental health diagnosis and concerns, Facilitate patient progression through stages of change regarding substance use diagnoses and concerns, Identify triggers associated with mental health/substance abuse issues, and Increase skills for wellness and recovery   Therapeutic Interventions: Assess for all discharge needs, 1 to 1 time with Social worker, Explore available resources and support systems, Assess for adequacy in community support network, Educate family and significant other(s) on suicide prevention, Complete Psychosocial Assessment, Interpersonal group therapy.  Evaluation of Outcomes: Progressing   Progress in Treatment: Attending  groups: Yes. 03/22/24 Update: Yes.  Participating in groups: Yes. 03/22/24 Update: Yes.  Taking medication as prescribed: Yes. 03/22/24 Update: Yes.  Toleration medication: Yes. 03/22/24 Update: Yes.   Family/Significant other contact made: No, will contact:  once permission has been granted 03/22/24 Update: No, Patient declined. SPE completed with pt, as pt refused to consent to family contact. SPI pamphlet provided to pt and pt was encouraged to share information with support network, ask questions, and talk about any concerns relating to SPE. Pt denies access to guns/firearms and verbalized understanding of information provided. Mobile Crisis information also provided to pt.   Patient understands diagnosis: Yes. 03/22/24 Update: Yes. Discussing patient identified problems/goals with staff: Yes. 03/22/24 Update: Yes. Medical problems stabilized or resolved: Yes. 03/22/24 Update: Yes. Denies suicidal/homicidal ideation: Yes. 03/22/24 Update: Yes. Issues/concerns per patient self-inventory: No. 03/22/24 Update: No.  Other: none 03/22/24 Update: None.     New problem(s) identified: New problem(s) identified: No, Describe:  None identified  Update 03/17/2024: No changes at this time. 03/22/24 Update: No changes at this time.    New Short Term/Long Term Goal(s): elimination of symptoms of psychosis, medication management for mood stabilization; elimination of SI thoughts; development of comprehensive mental wellness plan.  Update 03/17/2024: No changes at this time. 03/22/24 Update: No changes at this time.   Patient Goals:  Just trying to stop hearing voices  Update 03/17/2024: No changes at this time. 03/22/24 Update: No changes at this time.   Discharge Plan or Barriers: CSW will assist with appropriate discharge planning Update 03/17/2024: Patient reports plans to return to his home with is friend.  He reports no barriers at this time. 03/22/24 Update: No changes at this time.   Reason for Continuation of Hospitalization: Hallucinations Medication stabilization   Estimated Length of Stay: 1 to 7 days  Update 03/17/2024: TBD. 03/22/24 Update: TBD   Last 3 [place] Suicide Severity Risk  Score: Flowsheet Row Admission (Current) from 03/11/2024 in [facility] BEHAVIORAL MEDICINE Most recent reading at 03/11/2024 10:10 PM ED from 03/11/2024 in [facility] Most recent reading at 03/11/2024  3:32 PM Admission (Discharged) from 11/11/2023 in [facility] INPATIENT BEHAVIORAL MEDICINE Most recent reading at 11/11/2023  2:00 AM  C-SSRS RISK CATEGORY Moderate Risk Low Risk Low Risk    Last PHQ 2/9 Scores:     No data to display          Scribe for Treatment Team: Alveta CHRISTELLA Kerns, LCSW 03/22/2024 1:46 PM

## 2024-03-22 NOTE — Progress Notes (Signed)
   03/22/24 1300  Psych Admission Type (Psych Patients Only)  Admission Status Voluntary  Psychosocial Assessment  Patient Complaints None  Eye Contact Fair  Facial Expression Flat  Affect Flat  Speech Logical/coherent  Interaction Assertive  Motor Activity Slow  Appearance/Hygiene In scrubs  Behavior Characteristics Appropriate to situation  Mood Pleasant  Thought Process  Coherency WDL  Content WDL  Delusions None reported or observed  Perception WDL  Hallucination None reported or observed  Judgment WDL  Confusion None  Danger to Self  Current suicidal ideation? Denies  Agreement Not to Harm Self Yes  Description of Agreement verbal  Danger to Others  Danger to Others None reported or observed

## 2024-03-22 NOTE — Group Note (Signed)
 Date:  03/22/2024 Time:  9:02 PM  Group Topic/Focus:  Wrap-Up Group:   The focus of this group is to help patients review their daily goal of treatment and discuss progress on daily workbooks.    Participation Level:  Active  Participation Quality:  Appropriate  Affect:  Appropriate  Cognitive:  Alert  Insight: Appropriate  Engagement in Group:  Engaged  Modes of Intervention:  Discussion  Additional Comments:    Charles Charles 03/22/2024, 9:02 PM

## 2024-03-22 NOTE — Plan of Care (Signed)
  Problem: Education: Goal: Knowledge of the prescribed therapeutic regimen will improve Outcome: Progressing   Problem: Coping: Goal: Coping ability will improve Outcome: Progressing   

## 2024-03-22 NOTE — Group Note (Signed)
 Physical/Occupational Therapy Group Note  Group Topic: Yoga  Group Date: 03/22/2024 Start Time: 1300 End Time: 1330 Facilitators: Chaynce Schafer, Alm Hamilton, PT   Group Description: Group participated with series of yoga poses, designed to emphasize functional standing balance, core stability, generalized flexibility and overall posture.  Incorporated deep breathing techniques with poses, working to promote relaxation, mindfulness and focus with targeted activities.   Discussed benefits of yoga in improving mood and self-esteem, reducing stress and anxiety, and promoting functional strength and balance for each participant.  Discussed ways to integrate into each participant's daily routine.  Provided handout with written and pictorial descriptions of included yoga movements to be utilized as appropriate outside of group time.  Therapeutic Goal(s):  Demonstrate safe ability to participate with yoga poses during group activity. Identify one benefit of participation with yoga poses as part of each participant's exercise/movement routine. Identify 1-2 individual poses that participant feels most beneficial to his/her needs and that he/she can easily replicate outside of group.  Individual Participation: Did not attend  Participation Level:   Participation Quality:   Behavior:   Speech/Thought Process:   Affect/Mood:   Insight:   Judgement:   Modes of Intervention:   Patient Response to Interventions:    Plan: Continue to engage patient in PT/OT groups 1 - 2x/week.  CHARM Hamilton Bertin PT, DPT 03/22/24, 1:45 PM

## 2024-03-23 ENCOUNTER — Other Ambulatory Visit: Payer: Self-pay

## 2024-03-23 MED ORDER — SERTRALINE HCL 100 MG PO TABS
100.0000 mg | ORAL_TABLET | Freq: Every day | ORAL | 0 refills | Status: AC
  Filled 2024-03-23 (×2): qty 30, 30d supply, fill #0

## 2024-03-23 MED ORDER — RISPERIDONE 3 MG PO TABS
3.0000 mg | ORAL_TABLET | Freq: Two times a day (BID) | ORAL | 0 refills | Status: AC
  Filled 2024-03-23 (×2): qty 60, 30d supply, fill #0

## 2024-03-23 MED ORDER — DICLOFENAC SODIUM 1 % EX GEL
2.0000 g | Freq: Four times a day (QID) | CUTANEOUS | 0 refills | Status: AC
  Filled 2024-03-23: qty 100, 12d supply, fill #0

## 2024-03-23 NOTE — Discharge Summary (Signed)
 Physician Discharge Summary Note  Patient:  Charles Charles is an 63 y.o., male MRN:  [id number] DOB:  1961-03-29 Patient phone:  [phone] (home)  Patient address:   [address] [place] [address],   Total time spent: 40 min Date of Admission:  03/11/2024 Date of Discharge: 03/23/2024  Reason for Admission:  [name] is a 63 year old male with a past medical history of HIV, CVA, hypothyroidism, COPD, GERD, MDD and alcohol and cocaine use who has been admitted from [facility] after attempting to walk onto oncoming traffic. He states that he hears voices telling him to hurt himself, kill himself, and sometimes to hurt anyone in his way.  He reports hearing male voices, command type.  He reports the voices been happening for a long time.   Principal Problem: Schizoaffective disorder [facility]) Discharge Diagnoses: Principal Problem:   Schizoaffective disorder (HCC) Active Problems:   Substance-induced psychotic disorder with hallucinations (HCC)   Past Psychiatric History: Schizophrenia, MDD, polysubstance use, suicidal ideation   Family Psychiatric  History: None Social History:  Social History   Substance and Sexual Activity  Alcohol Use Yes   Alcohol/week: 56.0 standard drinks of alcohol   Types: 56 Cans of beer per week   Comment: 1 beer 2 xs a week - last drink 4-5 days ago     Social History   Substance and Sexual Activity  Drug Use Yes   Types: Cocaine, Marijuana   Comment: last use 3-4 days ago    Social History   Socioeconomic History   Marital status: Single    Spouse name: Not on file   Number of children: Not on file   Years of education: Not on file   Highest education level: 8th grade  Occupational History   Not on file  Tobacco Use   Smoking status: Every Day    Current packs/day: 1.00    Average packs/day: 1 pack/day for 0.7 years (0.7 ttl pk-yrs)    Types: Cigarettes    Start date: 2025   Smokeless tobacco: Never   Tobacco comments:     (1-2 cigs/day)  Vaping Use   Vaping status: Never Used  Substance and Sexual Activity   Alcohol use: Yes    Alcohol/week: 56.0 standard drinks of alcohol    Types: 56 Cans of beer per week    Comment: 1 beer 2 xs a week - last drink 4-5 days ago   Drug use: Yes    Types: Cocaine, Marijuana    Comment: last use 3-4 days ago   Sexual activity: Not Currently    Partners: Female  Other Topics Concern   Not on file  Social History Narrative   Not on file   Social Drivers of Health   Financial Resource Strain: High Risk (11/06/2022)   Received from [facility]   Overall Financial Resource Strain (CARDIA)    Difficulty of Paying Living Expenses: Hard  Food Insecurity: No Food Insecurity (03/11/2024)   Hunger Vital Sign    Worried About Running Out of Food in the Last Year: Never true    Ran Out of Food in the Last Year: Never true  Transportation Needs: Unmet Transportation Needs (03/11/2024)   PRAPARE - Administrator, Civil Service (Medical): Yes    Lack of Transportation (Non-Medical): Yes  Physical Activity: Not on file  Stress: Not on file  Social Connections: Not on file   Past Medical History:  Past Medical History:  Diagnosis  Date   Alcohol abuse 01/18/2021   Alcohol use disorder, moderate, dependence (HCC) 04/10/2016   Cannabis use disorder, moderate, dependence (HCC) 05/06/2016   COPD (chronic obstructive pulmonary disease) (HCC) 12/03/2022   COPD exacerbation (HCC) 12/03/2022   Drug overdose 01/05/2022   GERD (gastroesophageal reflux disease)    Headache    History of syphilis 12/03/2022   Neutropenia 12/03/2022   Schizo-affective schizophrenia (HCC)    Suicidal ideation 04/09/2016    Past Surgical History:  Procedure Laterality Date   COLONOSCOPY WITH PROPOFOL  N/A 08/04/2015   Procedure: COLONOSCOPY WITH PROPOFOL ;  Surgeon: Rogelia Copping, MD;  Location[facility];  Service: Endoscopy;  Laterality: N/A;   INCISION AND DRAINAGE PERIRECTAL  ABSCESS N/A 07/14/2015   Procedure: IRRIGATION AND DEBRIDEMENT PERIRECTAL ABSCESS;  Surgeon: Charlie FORBES Fell, MD;  Location: [facility];  Service: General;  Laterality: N/A;   INCISION AND DRAINAGE PERIRECTAL ABSCESS N/A 08/14/2015   Procedure: IRRIGATION AND DEBRIDEMENT PERIRECTAL ABSCESS;  Surgeon: Dorothyann LITTIE Husk, MD;  Location: [facility];  Service: General;  Laterality: N/A;   Family History:  Family History  Problem Relation Age of Onset   Anxiety disorder Mother    Diabetes Mother    Diabetes Father     Hospital Course:  Patient was admitted to the inpatient psychiatric unit for further stabilization for his suicidal thoughts and schizoaffective disorder. During the course of his stay, patient medication therapy included increasing his home sertraline  from 50 mg daily to 100 mg daily. His home Zyprexa  was discontinued to to poor control of auditory hallucinations and risperidone  was initiated at 1 mg nightly. Patient's risperidone  was then safely titrated to final dose of 3 mg 2 times daily. Patient will be discharged with the following prescriptions: sertraline  100 mg daily and risperidone  3 mg 2 times daily. Patient informed team that he did not get paid until October 1st and was unable to afford medications until then, so 30 day supply of medications were provided to patient prior to discharging. Patient has follow up appointments set up with [facility]. On the day of discharge, 03/23/2024,following sustained improvement in the affect of this patient, continued report of euthymic mood, repeated denial of suicidal, homicidal and other violent ideations, adequate interaction with peers, active participation in groups while on the unit, and denial of adverse reactions from the medications, the treatment team decided that patient could be discharged to go home, with scheduled mental health treatment as listed. A comprehensive risk assessment was done prior to discharge and shows that patient is at low  risk for suicide or violence and will continue to be if patient complies with the treatment recommendations, medications and therapy. Patient agrees to call Crisis Services, 911 and/or return to the ED if safety cannot be maintained outside the hospital setting. Discharge medications reviewed with patient, explanation of indication, risks/benefits and side effects profiles. The patient verbalized understanding and is in agreement with the discharge plan.  Detailed risk assessment is complete based on clinical exam and individual risk factors and acute suicide risk is low and acute violence risk is low.     Currently, all modifiable risk of harm to self/harm to others have been addressed and patient is no longer appropriate for the acute inpatient setting and is able to continue treatment for mental health needs in the community with the supports as indicated below.  Patient is educated and verbalized understanding of discharge plan of care including medications, follow-up appointments, mental health resources and further crisis services in  the community.  He is instructed to call 911 or present to the nearest emergency room should he experience any decompensation in mood, disturbance of bowel or return of suicidal/homicidal ideations.  Patient verbalizes understanding of this education and agrees to this plan of care  Physical Findings: AIMS:  , ,  ,  ,    CIWA:    COWS:        Psychiatric Specialty Exam:  Presentation  General Appearance:  Appropriate for Environment  Eye Contact: Good  Speech: Clear and Coherent  Speech Volume: Normal    Mood and Affect  Mood: Euthymic  Affect: Appropriate   Thought Process  Thought Processes: Coherent; Goal Directed  Descriptions of Associations:Intact  Orientation:Full (Time, Place and Person)  Thought Content:Logical  Hallucinations:Hallucinations: None  Ideas of Reference:None  Suicidal Thoughts:Suicidal Thoughts:  No  Homicidal Thoughts:Homicidal Thoughts: No   Sensorium  Memory: Immediate Fair; Recent Fair; Remote Fair  Judgment: Fair  Insight: Fair   Art therapist  Concentration: Fair  Attention Span: Fair  Recall: Fair  Fund of Knowledge: Fair  Language: Fair   Psychomotor Activity  Psychomotor Activity:No data recorded Musculoskeletal: Strength & Muscle Tone: within normal limits Gait & Station: normal Assets  Assets: Manufacturing systems engineer; Desire for Improvement; Housing; Social Support; Financial Resources/Insurance   Sleep  Sleep:Sleep: Good    Physical Exam: Physical Exam Vitals and nursing note reviewed.  HENT:     Head: Normocephalic.  Pulmonary:     Effort: Pulmonary effort is normal.  Neurological:     Mental Status: He is oriented to person, place, and time.    Review of Systems  Respiratory:  Negative for shortness of breath.   Cardiovascular:  Negative for chest pain.  Gastrointestinal:  Negative for nausea and vomiting.  Neurological:  Negative for headaches.  Psychiatric/Behavioral:  Negative for depression, hallucinations and suicidal ideas. The patient is not nervous/anxious.    Blood pressure (!) 105/58, pulse 65, temperature 98.5 F (36.9 C), resp. rate 14, height 5' 6 (1.676 m), weight 73 kg, SpO2 100%. Body mass index is 25.98 kg/m.   Social History   Tobacco Use  Smoking Status Every Day   Current packs/day: 1.00   Average packs/day: 1 pack/day for 0.7 years (0.7 ttl pk-yrs)   Types: Cigarettes   Start date: 2025  Smokeless Tobacco Never  Tobacco Comments   (1-2 cigs/day)   Tobacco Cessation:  N/A, patient does not currently use tobacco products   Blood Alcohol level:  Lab Results  Component Value Date   ETH <15 03/11/2024   ETH 185 (H) 11/10/2023    Metabolic Disorder Labs:  Lab Results  Component Value Date   HGBA1C 5.7 (H) 03/11/2024   MPG 116.89 03/11/2024   MPG 137 12/02/2022   Lab Results   Component Value Date   PROLACTIN 61.5 (H) 06/05/2016   PROLACTIN 18.8 (H) 06/04/2016   Lab Results  Component Value Date   CHOL 166 03/11/2024   TRIG 102 03/11/2024   HDL 48 03/11/2024   CHOLHDL 3.5 03/11/2024   VLDL 20 03/11/2024   LDLCALC 98 03/11/2024   LDLCALC 91 02/24/2024    See Psychiatric Specialty Exam and Suicide Risk Assessment completed by Attending Physician prior to discharge.  Discharge destination:  Home  Is patient on multiple antipsychotic therapies at discharge:  No   Has Patient had three or more failed trials of antipsychotic monotherapy by history:  No  Recommended Plan for Multiple Antipsychotic Therapies: NA  Discharge Instructions  Diet - low sodium heart healthy   Complete by: As directed    Increase activity slowly   Complete by: As directed       Allergies as of 03/23/2024       Reactions   Sulfa  Antibiotics Itching        Medication List     STOP taking these medications    OLANZapine  15 MG tablet Commonly known as: ZYPREXA        TAKE these medications      Indication  albuterol  108 (90 Base) MCG/ACT inhaler Commonly known as: VENTOLIN  HFA Inhale 2 puffs into the lungs every 6 (six) hours as needed for wheezing or shortness of breath.    Delstrigo  100-300-300 MG Tabs per tablet Generic drug: doravirin-lamivudin-tenofov df Take 1 tablet by mouth daily.  Indication: HIV Disease   diclofenac  Sodium 1 % Gel Commonly known as: VOLTAREN  Apply 2 g topically 4 (four) times daily.    hydrOXYzine  25 MG tablet Commonly known as: ATARAX  Take 1 tablet (25 mg total) by mouth 3 (three) times daily as needed for anxiety. What changed: when to take this  Indication: Feeling Anxious   pantoprazole  20 MG tablet Commonly known as: PROTONIX  Take 1 tablet (20 mg total) by mouth daily.  Indication: Gastroesophageal Reflux Disease   risperiDONE  3 MG tablet Commonly known as: RISPERDAL  Take 1 tablet (3 mg total) by mouth 2  (two) times daily.  Indication: Schizophrenia   sertraline  100 MG tablet Commonly known as: ZOLOFT  Take 1 tablet (100 mg total) by mouth daily. What changed:  medication strength how much to take  Indication: Major Depressive Disorder        Follow-up Information     [facility] Follow up on 03/30/2024.   Why: Chanze Teagle has an appt on 9/30 at 3:30 via the phone and will receive a call at [phone] Contact information: [address]  [address] [place] [address] [phone]                 Follow-up recommendations:  Activity:  Increase as tolerated Diet:  Continue with heart healthy diet    Signed: Zelda Sharps, NP

## 2024-03-23 NOTE — Group Note (Signed)
 Date:  03/23/2024 Time:  11:03 AM  Group Topic/Focus:  Managing Feelings:   The focus of this group is to identify what feelings patients have difficulty handling and develop a plan to handle them in a healthier way upon discharge.    Participation Level:  Active  Participation Quality:  Appropriate  Affect:  Appropriate  Cognitive:  Appropriate  Insight: Appropriate  Engagement in Group:  Engaged  Modes of Intervention:  Discussion   Arland Nutting 03/23/2024, 11:03 AM

## 2024-03-23 NOTE — Progress Notes (Signed)
   03/23/24 1000  Psych Admission Type (Psych Patients Only)  Admission Status Voluntary  Psychosocial Assessment  Patient Complaints None  Eye Contact Fair  Facial Expression Animated  Affect Appropriate to circumstance  Speech Logical/coherent  Interaction Assertive  Motor Activity Slow  Appearance/Hygiene In scrubs  Behavior Characteristics Appropriate to situation  Mood Pleasant  Thought Process  Coherency WDL  Content WDL  Delusions None reported or observed  Perception WDL  Hallucination None reported or observed  Judgment WDL  Confusion None  Danger to Self  Current suicidal ideation? Denies  Agreement Not to Harm Self Yes  Description of Agreement verbal  Danger to Others  Danger to Others None reported or observed   Discharged to home at this time via taxi. All belongings given to patient including discharge medications ordered and medication held in pharmacy. All instructions read to patient with understanding.

## 2024-03-23 NOTE — Progress Notes (Signed)
  [facility] Adult Case Management Discharge Plan :  Will you be returning to the same living situation after discharge:  Yes,  pt will return home  At discharge, do you have transportation home?: Yes,  CSW will assist with transportation  Do you have the ability to pay for your medications: Yes,  [employer] / [employer]  Release of information consent forms completed and in the chart;  Patient's signature needed at discharge.  Patient to Follow up at:  Follow-up Information     [facility] Follow up on 03/30/2024.   Why: Shann Merrick has an appt on 9/30 at 3:30 via the phone and will receive a call at [phone] Contact information: [address]  [address] [place] [address] [phone]                 Next level of care provider has access to [facility] Link:no  Safety Planning and Suicide Prevention discussed: Yes,  SPE gone over with pt      Has patient been referred to the Quitline?: Yes, faxed/e-referral on 03/23/24  Patient has been referred for addiction treatment: Patient refused referral for treatment.  [address] LCSWA 03/23/2024, 9:25 AM

## 2024-05-18 ENCOUNTER — Ambulatory Visit: Payer: MEDICAID | Admitting: Internal Medicine

## 2024-05-31 NOTE — Progress Notes (Signed)
 The ASCVD Risk score (Arnett DK, et al., 2019) failed to calculate for the following reasons:   Risk score cannot be calculated because patient has a medical history suggesting prior/existing ASCVD  Arlon Bergamo, BSN, RN
